# Patient Record
Sex: Female | Born: 1950 | Race: White | Hispanic: No | Marital: Married | Smoking: Former smoker
Health system: Southern US, Community
[De-identification: ages and names within clinical notes are randomized; demographics above are authoritative.]

## PROBLEM LIST (undated history)

## (undated) DIAGNOSIS — D869 Sarcoidosis, unspecified: Secondary | ICD-10-CM

## (undated) DIAGNOSIS — I639 Cerebral infarction, unspecified: Secondary | ICD-10-CM

## (undated) DIAGNOSIS — I10 Essential (primary) hypertension: Secondary | ICD-10-CM

## (undated) HISTORY — DX: Cerebral infarction, unspecified: I63.9

## (undated) HISTORY — DX: Sarcoidosis, unspecified: D86.9

## (undated) HISTORY — DX: Essential (primary) hypertension: I10

## (undated) HISTORY — PX: BRAIN SURGERY: SHX531

---

## 2007-03-02 ENCOUNTER — Emergency Department (HOSPITAL_COMMUNITY): Admission: EM | Admit: 2007-03-02 | Discharge: 2007-03-02 | Payer: Self-pay | Admitting: Emergency Medicine

## 2007-03-07 ENCOUNTER — Emergency Department (HOSPITAL_COMMUNITY): Admission: EM | Admit: 2007-03-07 | Discharge: 2007-03-08 | Payer: Self-pay | Admitting: Emergency Medicine

## 2017-12-31 ENCOUNTER — Other Ambulatory Visit: Payer: Self-pay

## 2017-12-31 ENCOUNTER — Inpatient Hospital Stay (HOSPITAL_COMMUNITY)
Admission: EM | Admit: 2017-12-31 | Discharge: 2018-01-06 | DRG: 025 | Disposition: A | Discharge status: Rehabilitation Facility | Payer: Medicare Other | Attending: Internal Medicine | Admitting: Internal Medicine

## 2017-12-31 ENCOUNTER — Encounter (HOSPITAL_COMMUNITY): Payer: Self-pay

## 2017-12-31 ENCOUNTER — Inpatient Hospital Stay (HOSPITAL_COMMUNITY): Payer: Medicare Other

## 2017-12-31 ENCOUNTER — Emergency Department (HOSPITAL_COMMUNITY): Payer: Medicare Other

## 2017-12-31 DIAGNOSIS — Z794 Long term (current) use of insulin: Secondary | ICD-10-CM | POA: Diagnosis not present

## 2017-12-31 DIAGNOSIS — G968 Other specified disorders of central nervous system: Secondary | ICD-10-CM

## 2017-12-31 DIAGNOSIS — Z8249 Family history of ischemic heart disease and other diseases of the circulatory system: Secondary | ICD-10-CM | POA: Diagnosis not present

## 2017-12-31 DIAGNOSIS — R4781 Slurred speech: Secondary | ICD-10-CM | POA: Diagnosis not present

## 2017-12-31 DIAGNOSIS — G9341 Metabolic encephalopathy: Secondary | ICD-10-CM | POA: Diagnosis present

## 2017-12-31 DIAGNOSIS — G939 Disorder of brain, unspecified: Secondary | ICD-10-CM | POA: Diagnosis not present

## 2017-12-31 DIAGNOSIS — R739 Hyperglycemia, unspecified: Secondary | ICD-10-CM | POA: Diagnosis not present

## 2017-12-31 DIAGNOSIS — D496 Neoplasm of unspecified behavior of brain: Secondary | ICD-10-CM | POA: Diagnosis not present

## 2017-12-31 DIAGNOSIS — R7989 Other specified abnormal findings of blood chemistry: Secondary | ICD-10-CM | POA: Diagnosis not present

## 2017-12-31 DIAGNOSIS — Z79899 Other long term (current) drug therapy: Secondary | ICD-10-CM

## 2017-12-31 DIAGNOSIS — R410 Disorientation, unspecified: Secondary | ICD-10-CM | POA: Insufficient documentation

## 2017-12-31 DIAGNOSIS — R482 Apraxia: Secondary | ICD-10-CM | POA: Diagnosis present

## 2017-12-31 DIAGNOSIS — C8599 Non-Hodgkin lymphoma, unspecified, extranodal and solid organ sites: Secondary | ICD-10-CM | POA: Diagnosis not present

## 2017-12-31 DIAGNOSIS — I69320 Aphasia following cerebral infarction: Secondary | ICD-10-CM | POA: Diagnosis not present

## 2017-12-31 DIAGNOSIS — E669 Obesity, unspecified: Secondary | ICD-10-CM | POA: Diagnosis present

## 2017-12-31 DIAGNOSIS — Z23 Encounter for immunization: Secondary | ICD-10-CM

## 2017-12-31 DIAGNOSIS — R569 Unspecified convulsions: Secondary | ICD-10-CM | POA: Diagnosis not present

## 2017-12-31 DIAGNOSIS — C719 Malignant neoplasm of brain, unspecified: Secondary | ICD-10-CM | POA: Diagnosis present

## 2017-12-31 DIAGNOSIS — Y838 Other surgical procedures as the cause of abnormal reaction of the patient, or of later complication, without mention of misadventure at the time of the procedure: Secondary | ICD-10-CM | POA: Diagnosis present

## 2017-12-31 DIAGNOSIS — E46 Unspecified protein-calorie malnutrition: Secondary | ICD-10-CM | POA: Diagnosis not present

## 2017-12-31 DIAGNOSIS — I169 Hypertensive crisis, unspecified: Secondary | ICD-10-CM | POA: Diagnosis not present

## 2017-12-31 DIAGNOSIS — R930 Abnormal findings on diagnostic imaging of skull and head, not elsewhere classified: Secondary | ICD-10-CM | POA: Diagnosis not present

## 2017-12-31 DIAGNOSIS — G936 Cerebral edema: Secondary | ICD-10-CM | POA: Diagnosis present

## 2017-12-31 DIAGNOSIS — T380X5A Adverse effect of glucocorticoids and synthetic analogues, initial encounter: Secondary | ICD-10-CM | POA: Diagnosis not present

## 2017-12-31 DIAGNOSIS — R269 Unspecified abnormalities of gait and mobility: Secondary | ICD-10-CM | POA: Diagnosis not present

## 2017-12-31 DIAGNOSIS — R29810 Facial weakness: Secondary | ICD-10-CM | POA: Diagnosis present

## 2017-12-31 DIAGNOSIS — Z800 Family history of malignant neoplasm of digestive organs: Secondary | ICD-10-CM | POA: Diagnosis not present

## 2017-12-31 DIAGNOSIS — F17210 Nicotine dependence, cigarettes, uncomplicated: Secondary | ICD-10-CM | POA: Diagnosis not present

## 2017-12-31 DIAGNOSIS — Z6834 Body mass index (BMI) 34.0-34.9, adult: Secondary | ICD-10-CM

## 2017-12-31 DIAGNOSIS — G9389 Other specified disorders of brain: Secondary | ICD-10-CM | POA: Diagnosis not present

## 2017-12-31 DIAGNOSIS — I10 Essential (primary) hypertension: Secondary | ICD-10-CM | POA: Diagnosis present

## 2017-12-31 DIAGNOSIS — R531 Weakness: Secondary | ICD-10-CM | POA: Diagnosis present

## 2017-12-31 DIAGNOSIS — L7632 Postprocedural hematoma of skin and subcutaneous tissue following other procedure: Secondary | ICD-10-CM | POA: Diagnosis not present

## 2017-12-31 DIAGNOSIS — R4701 Aphasia: Secondary | ICD-10-CM | POA: Diagnosis not present

## 2017-12-31 DIAGNOSIS — K5900 Constipation, unspecified: Secondary | ICD-10-CM | POA: Diagnosis present

## 2017-12-31 DIAGNOSIS — I69398 Other sequelae of cerebral infarction: Secondary | ICD-10-CM | POA: Diagnosis not present

## 2017-12-31 DIAGNOSIS — R51 Headache: Secondary | ICD-10-CM

## 2017-12-31 DIAGNOSIS — G9689 Other specified disorders of central nervous system: Secondary | ICD-10-CM | POA: Diagnosis present

## 2017-12-31 DIAGNOSIS — G8191 Hemiplegia, unspecified affecting right dominant side: Secondary | ICD-10-CM | POA: Diagnosis not present

## 2017-12-31 DIAGNOSIS — R4020 Unspecified coma: Secondary | ICD-10-CM | POA: Diagnosis not present

## 2017-12-31 DIAGNOSIS — R799 Abnormal finding of blood chemistry, unspecified: Secondary | ICD-10-CM | POA: Diagnosis not present

## 2017-12-31 DIAGNOSIS — J9811 Atelectasis: Secondary | ICD-10-CM | POA: Diagnosis not present

## 2017-12-31 DIAGNOSIS — G40909 Epilepsy, unspecified, not intractable, without status epilepticus: Secondary | ICD-10-CM | POA: Diagnosis not present

## 2017-12-31 DIAGNOSIS — R519 Headache, unspecified: Secondary | ICD-10-CM | POA: Diagnosis present

## 2017-12-31 DIAGNOSIS — C8589 Other specified types of non-Hodgkin lymphoma, extranodal and solid organ sites: Secondary | ICD-10-CM | POA: Diagnosis not present

## 2017-12-31 DIAGNOSIS — I69351 Hemiplegia and hemiparesis following cerebral infarction affecting right dominant side: Secondary | ICD-10-CM | POA: Diagnosis not present

## 2017-12-31 DIAGNOSIS — R7303 Prediabetes: Secondary | ICD-10-CM | POA: Diagnosis present

## 2017-12-31 DIAGNOSIS — G969 Disorder of central nervous system, unspecified: Secondary | ICD-10-CM | POA: Diagnosis not present

## 2017-12-31 DIAGNOSIS — K8020 Calculus of gallbladder without cholecystitis without obstruction: Secondary | ICD-10-CM | POA: Diagnosis not present

## 2017-12-31 LAB — DIFFERENTIAL
Basophils Absolute: 0 10*3/uL (ref 0.0–0.1)
Basophils Relative: 0 %
Eosinophils Absolute: 0.1 10*3/uL (ref 0.0–0.7)
Eosinophils Relative: 1 %
Lymphocytes Relative: 13 %
Lymphs Abs: 1.4 10*3/uL (ref 0.7–4.0)
Monocytes Absolute: 0.9 10*3/uL (ref 0.1–1.0)
Monocytes Relative: 9 %
Neutro Abs: 7.9 10*3/uL — ABNORMAL HIGH (ref 1.7–7.7)
Neutrophils Relative %: 77 %

## 2017-12-31 LAB — I-STAT CHEM 8, ED
BUN: 24 mg/dL — ABNORMAL HIGH (ref 6–20)
Calcium, Ion: 1.01 mmol/L — ABNORMAL LOW (ref 1.15–1.40)
Chloride: 103 mmol/L (ref 101–111)
Creatinine, Ser: 0.6 mg/dL (ref 0.44–1.00)
Glucose, Bld: 110 mg/dL — ABNORMAL HIGH (ref 65–99)
HCT: 41 % (ref 36.0–46.0)
Hemoglobin: 13.9 g/dL (ref 12.0–15.0)
Potassium: 4.5 mmol/L (ref 3.5–5.1)
Sodium: 135 mmol/L (ref 135–145)
TCO2: 23 mmol/L (ref 22–32)

## 2017-12-31 LAB — CBC
HCT: 45.3 % (ref 36.0–46.0)
Hemoglobin: 15.3 g/dL — ABNORMAL HIGH (ref 12.0–15.0)
MCH: 31.5 pg (ref 26.0–34.0)
MCHC: 33.8 g/dL (ref 30.0–36.0)
MCV: 93.4 fL (ref 78.0–100.0)
Platelets: 424 10*3/uL — ABNORMAL HIGH (ref 150–400)
RBC: 4.85 MIL/uL (ref 3.87–5.11)
RDW: 13.1 % (ref 11.5–15.5)
WBC: 9.4 10*3/uL (ref 4.0–10.5)

## 2017-12-31 LAB — COMPREHENSIVE METABOLIC PANEL
ALT: 14 U/L (ref 14–54)
AST: 15 U/L (ref 15–41)
Albumin: 4.1 g/dL (ref 3.5–5.0)
Alkaline Phosphatase: 118 U/L (ref 38–126)
Anion gap: 11 (ref 5–15)
BUN: 17 mg/dL (ref 6–20)
CO2: 24 mmol/L (ref 22–32)
Calcium: 9.2 mg/dL (ref 8.9–10.3)
Chloride: 101 mmol/L (ref 101–111)
Creatinine, Ser: 0.74 mg/dL (ref 0.44–1.00)
GFR calc Af Amer: >60 mL/min (ref >60)
GFR calc non Af Amer: >60 mL/min (ref >60)
Glucose, Bld: 115 mg/dL — ABNORMAL HIGH (ref 65–99)
Potassium: 3.6 mmol/L (ref 3.5–5.1)
Sodium: 136 mmol/L (ref 135–145)
Total Bilirubin: 0.7 mg/dL (ref 0.3–1.2)
Total Protein: 7.2 g/dL (ref 6.5–8.1)

## 2017-12-31 LAB — I-STAT TROPONIN, ED: Troponin i, poc: 0 ng/mL (ref 0.00–0.08)

## 2017-12-31 LAB — PROTIME-INR
INR: 1.19
Prothrombin Time: 15 seconds (ref 11.4–15.2)

## 2017-12-31 LAB — CBG MONITORING, ED: Glucose-Capillary: 101 mg/dL — ABNORMAL HIGH (ref 65–99)

## 2017-12-31 LAB — ETHANOL: Alcohol, Ethyl (B): <10 mg/dL (ref <10)

## 2017-12-31 LAB — APTT: aPTT: 40 seconds — ABNORMAL HIGH (ref 24–36)

## 2017-12-31 MED ORDER — DEXAMETHASONE SODIUM PHOSPHATE 10 MG/ML IJ SOLN
10.0000 mg | Freq: Once | INTRAMUSCULAR | Status: AC
  Administered 2017-12-31: 10 mg via INTRAVENOUS
  Filled 2017-12-31: qty 1

## 2017-12-31 MED ORDER — GADOBENATE DIMEGLUMINE 529 MG/ML IV SOLN
20.0000 mL | Freq: Once | INTRAVENOUS | Status: AC | PRN
  Administered 2018-01-01: 20 mL via INTRAVENOUS

## 2017-12-31 NOTE — ED Provider Notes (Signed)
[facility] Provider Note   CSN: [id number] Arrival date & time: 12/31/17  1553     History   Chief Complaint Chief Complaint  Patient presents with  . Altered Mental Status    HPI Tara [name] is a 67 y.o. female.  HPI  67 year old female known to giving a past medical history presents the emergency department accompanied by son and husband who are concerned about family member with perceived confusion described as not oriented to time, delayed speech, difficulty with word finding with last known normal yesterday at 37 with an associated headache that is now resolved.  Patient denies any recent trauma/illness or fever.  Patient denies any antiplatelet/anticoagulation therapy.  Currently denies any headache.  History reviewed. No pertinent past medical history.  Patient Active Problem List   Diagnosis Date Noted  . Headache 12/31/2017  . Hypertension 12/31/2017  . Hyperglycemia 12/31/2017  . Confusion 12/31/2017    History reviewed. No pertinent surgical history.  OB History    No data available       Home Medications    Prior to Admission medications   Medication Sig Start Date End Date Taking? Authorizing Provider  aspirin-acetaminophen-caffeine (EXCEDRIN MIGRAINE) [phone] MG tablet Take 1 tablet by mouth every 6 (six) hours as needed for headache or migraine.   Yes [provider]  Multiple Vitamin (MULTIVITAMIN WITH MINERALS) TABS tablet Take 1 tablet by mouth daily.   Yes [provider]  OVER THE COUNTER MEDICATION Take by mouth See admin instructions. Over the counter diet pills (Keto etc)   Yes [provider]    Family History Family History  Problem Relation Age of Onset  . Colon cancer Mother   . CAD Father     Social History Social History   Tobacco Use  . Smoking status: Current Every Day Smoker    Packs/day: 1.00    Types: Cigarettes  . Smokeless tobacco: Never Used   Substance Use Topics  . Alcohol use: No    Frequency: Never  . Drug use: No     Allergies   Patient has no known allergies.   Review of Systems Review of Systems  Review of Systems  Constitutional: Negative for fever and chills.  HENT: Negative for ear pain, sore throat and trouble swallowing.   Eyes: Negative for pain and visual disturbance.  Respiratory: Negative for cough and shortness of breath.   Cardiovascular: Negative for chest pain and leg swelling.  Gastrointestinal: Negative for nausea, vomiting, abdominal pain and diarrhea.  Genitourinary: Negative for dysuria, urgency and frequency.  Musculoskeletal: Negative for back pain and joint swelling.  Skin: Negative for rash and wound.  Neurological: see HPI   Physical Exam Updated Vital Signs BP (!) 157/76   Pulse 73   Temp 98.5 F (36.9 C) (Oral)   Resp 19   Wt 108.9 kg (240 lb)   SpO2 95%   Physical Exam  Physical Exam Vitals:   12/31/17 2303 12/31/17 2315  BP: (!) 157/99 (!) 157/76  Pulse: 73 73  Resp: 17 19  Temp:    SpO2: 96% 95%   Constitutional: Patient is in no acute distress Head: Normocephalic and atraumatic.  Eyes: Extraocular motion intact, no scleral icterus Neck: Supple without meningismus, mass, or overt JVD Respiratory: Effort normal and breath sounds normal. No respiratory distress. CV: Heart regular rate and rhythm, no obvious murmurs.  Pulses +2 and symmetric Abdomen: Soft, non-tender, non-distended MSK: Extremities are atraumatic without deformity,  ROM intact Skin: Warm, dry, intact Neuro: confusion to time/ "president", no motor deficit noted; CN II-XII intact; neg pronator drift; F-->N intact; RAM intact; slow speech/ Psychiatric: Mood and affect are normal.  ED Treatments / Results  Labs (all labs ordered are listed, but only abnormal results are displayed) Labs Reviewed  COMPREHENSIVE METABOLIC PANEL - Abnormal; Notable for the following components:      Result Value    Glucose, Bld 115 (*)    All other components within normal limits  CBC - Abnormal; Notable for the following components:   Hemoglobin 15.3 (*)    Platelets 424 (*)    All other components within normal limits  APTT - Abnormal; Notable for the following components:   aPTT 40 (*)    All other components within normal limits  DIFFERENTIAL - Abnormal; Notable for the following components:   Neutro Abs 7.9 (*)    All other components within normal limits  CBG MONITORING, ED - Abnormal; Notable for the following components:   Glucose-Capillary 101 (*)    All other components within normal limits  I-STAT CHEM 8, ED - Abnormal; Notable for the following components:   BUN 24 (*)    Glucose, Bld 110 (*)    Calcium, Ion 1.01 (*)    All other components within normal limits  ETHANOL  PROTIME-INR  RAPID URINE DRUG SCREEN, HOSP PERFORMED  URINALYSIS, ROUTINE W REFLEX MICROSCOPIC  I-STAT TROPONIN, ED    EKG  EKG Interpretation None       Radiology Ct Head Wo Contrast  Result Date: 12/31/2017 CLINICAL DATA:  Altered level of consciousness. EXAM: CT HEAD WITHOUT CONTRAST TECHNIQUE: Contiguous axial images were obtained from the base of the skull through the vertex without intravenous contrast. COMPARISON:  None. FINDINGS: Brain: Large amount of white matter edema is seen in the left basal ganglia and temporal lobe. This is concerning for underlying neoplasm. This results in 9 mm of left-to-right midline shift. No ventricular dilatation is noted. No definite hemorrhage is noted. Vascular: No hyperdense vessel or unexpected calcification. Skull: Normal. Negative for fracture or focal lesion. Sinuses/Orbits: No acute finding. Other: None. IMPRESSION: Large amount of white matter edema is seen in the left basal ganglia and left temporal lobe concerning for underlying neoplasm. This results in 9 mm of left-to-right midline shift. Further evaluation with MRI with and without gadolinium administration is  recommended. Electronically Signed   By: Marijo Conception, M.D.   On: 12/31/2017 21:32    Procedures Procedures (including critical care time)  Medications Ordered in ED Medications  gadobenate dimeglumine (MULTIHANCE) injection 20 mL (not administered)  dexamethasone (DECADRON) injection 10 mg (10 mg Intravenous Given 12/31/17 2258)     Initial Impression / Assessment and Plan / ED Course  I have reviewed the triage vital signs and the nursing notes.  Pertinent labs & imaging results that were available during my care of the patient were reviewed by me and considered in my medical decision making (see chart for details).     67 year old female known to giving a past medical history presents the emergency department accompanied by son and husband who are concerned about family member with perceived confusion described as not oriented to time, delayed speech, difficulty with word finding with last known normal yesterday at 44 with an associated headache that is now resolved.  Patient denies any recent trauma/illness or fever.  Patient denies any antiplatelet/anticoagulation therapy.  Currently denies any headache.  Patient arrives here medically  stable otherwise well-appearing.  Physical exam with no evidence of acute focal deficit.  Review of labs unremarkable.  CT head Noncon with left basal ganglia/left pleural lobe concerning for underlying neoplasm with a left right 9 mm shift.  plan for admission to hospitalist.  Hospitalist will call neurology in AM. MR brain with and without contrast along with MRA brain ordered.  Hospitalist will admit to floor and further disposition per neurology with possible consults to hematology/neurosurgery pending imaging studies.  Of note 10 mg Decadron given in the emergency department.   Final Clinical Impressions(s) / ED Diagnoses   Final diagnoses:  Confusion  Abnormal CT of the head    ED Discharge Orders    None       Willette Alma,  DO 12/31/17 [place], [place], DO 12/31/17 2352

## 2017-12-31 NOTE — ED Notes (Signed)
Patient transported to MRI 

## 2017-12-31 NOTE — ED Triage Notes (Addendum)
Pt presents to the ed with complaints of confusion that started yesterday. Pt has head a mild headache x 1 day, has subsided at this time.  No focal neuro deficits. Pt is alert to self and place, disoriented to time.

## 2017-12-31 NOTE — H&P (Signed)
[facility] H&P   Patient Demographics:    Tara [name], is a 67 y.o. female  MRN: [id number]   DOB - 1951-09-17  Admit Date - 12/31/2017  Outpatient Primary MD for the patient is Patient, No Pcp Per  Referring MD/NP/PA:  Agustina Caroli   Outpatient Specialists:   Patient coming from: home  Chief Complaint  Patient presents with  . Altered Mental Status      HPI:    Tara [name]  is a 67 y.o. female, w  C/o confusion, and difficulty finishing sentences since last nite.  Pt c/o slight left frontal headache.  Pt denies fever, chills, cough, cp, palp, sob, n/v, diarrhea, dysuria, numbness, tingling, weakness.  Pt brought for evaluation of confusion.   In Ed,  Neurology consulted by ED, and requested MRI/MRA brain to determine if stroke vs brain tumor.    CT brain IMPRESSION: Large amount of white matter edema is seen in the left basal ganglia and left temporal lobe concerning for underlying neoplasm. This results in 9 mm of left-to-right midline shift. Further evaluation with MRI with and without gadolinium administration is recommended.  Na 136, K 3.6, Bun 17, Creatinine 0.74 Ast 15, Alt 14 Wbc 9.4, Hgb 15.3, Plt 424 Etoh <10 INR 1.19 PTT 40  MRI/ MRA IMPRESSION: 1. Masslike parenchymal contrast enhancement adjacent to the left MCA M1 and proximal M2 segments with large area of surrounding hyperintense T2-weighted signal. The appearance is concerning for lymphoma -- including the intravascular variant -- with a large amount of surrounding edema. A primary CNS neoplasm, most likely a high grade glioma, is also a possibility, in which case the abnormal T2WI hyperintensity could indicate edema or non-enhancing tumor. Vasculitis is less likely in the context of normal MRA, but remains a consideration (MRA is less sensitive for detecting findings of vasculitis than CTA  or conventional angiography). 2. Normal MRA. 3. Unchanged 6 mm rightward midline shift.   Pt will be admitted for evaluation of above.       Review of systems:    In addition to the HPI above,  No Fever-chills,  No changes with Vision or hearing, No problems swallowing food or Liquids, No Chest pain, Cough or Shortness of Breath, No Abdominal pain, No Nausea or Vommitting, Bowel movements are regular, No Blood in stool or Urine, No dysuria, No new skin rashes or bruises, No new joints pains-aches,  No new weakness, tingling, numbness in any extremity, No recent weight gain or loss, No polyuria, polydypsia or polyphagia, No significant Mental Stressors.  A full 10 point Review of Systems was done, except as stated above, all other Review of Systems were negative.   With Past History of the following :    History reviewed. No pertinent past medical history.    History reviewed. No pertinent surgical history.    Social History:     Social History  Tobacco Use  . Smoking status: Current Every Day Smoker    Packs/day: 1.00    Types: Cigarettes  . Smokeless tobacco: Never Used  Substance Use Topics  . Alcohol use: No    Frequency: Never     Lives - at home  Mobility - walks by self   Family History :     Family History  Problem Relation Age of Onset  . Colon cancer Mother   . CAD Father    Negative for brain tumor   Home Medications:   Prior to Admission medications   Medication Sig Start Date End Date Taking? Authorizing Provider  aspirin-acetaminophen-caffeine (EXCEDRIN MIGRAINE) [phone] MG tablet Take 1 tablet by mouth every 6 (six) hours as needed for headache or migraine.   Yes [provider]  Multiple Vitamin (MULTIVITAMIN WITH MINERALS) TABS tablet Take 1 tablet by mouth daily.   Yes [provider]  OVER THE COUNTER MEDICATION Take by mouth See admin instructions. Over the counter diet pills (Keto etc)   Yes [provider]     Allergies:    No Known Allergies   Physical Exam:   Vitals  Blood pressure (!) 157/76, pulse 73, temperature 98.5 F (36.9 C), temperature source Oral, resp. rate 19, weight 108.9 kg (240 lb), SpO2 95 %.   1. General  lying in bed in NAD,   2. Normal affect and insight, Not Suicidal or Homicidal, Awake Alert, Oriented X 3.  3. No F.N deficits, ALL C.Nerves Intact, Strength 5/5 all 4 extremities, Sensation intact all 4 extremities, Plantars down going.  4. Ears and Eyes appear Normal, Conjunctivae clear, PERRLA. Moist Oral Mucosa.  5. Supple Neck, No JVD, No cervical lymphadenopathy appriciated, No Carotid Bruits.  6. Symmetrical Chest wall movement, Good air movement bilaterally, CTAB.  7. RRR, No Gallops, Rubs or Murmurs, No Parasternal Heave.  8. Positive Bowel Sounds, Abdomen Soft, No tenderness, No organomegaly appriciated,No rebound -guarding or rigidity.  9.  No Cyanosis, Normal Skin Turgor, No Skin Rash or Bruise.  10. Good muscle tone,  joints appear normal , no effusions, Normal ROM.  11. No Palpable Lymph Nodes in Neck or Axillae     Data Review:    CBC Recent Labs  Lab 12/31/17 1638 12/31/17 2204  WBC 9.4  --   HGB 15.3* 13.9  HCT 45.3 41.0  PLT 424*  --   MCV 93.4  --   MCH 31.5  --   MCHC 33.8  --   RDW 13.1  --   LYMPHSABS 1.4  --   MONOABS 0.9  --   EOSABS 0.1  --   BASOSABS 0.0  --    ------------------------------------------------------------------------------------------------------------------  Chemistries  Recent Labs  Lab 12/31/17 1638 12/31/17 2204  NA 136 135  K 3.6 4.5  CL 101 103  CO2 24  --   GLUCOSE 115* 110*  BUN 17 24*  CREATININE 0.74 0.60  CALCIUM 9.2  --   AST 15  --   ALT 14  --   ALKPHOS 118  --   BILITOT 0.7  --    ------------------------------------------------------------------------------------------------------------------ CrCl cannot be calculated (Unknown ideal  weight.). ------------------------------------------------------------------------------------------------------------------ No results for input(s): TSH, T4TOTAL, T3FREE, THYROIDAB in the last 72 hours.  Invalid input(s): FREET3  Coagulation profile Recent Labs  Lab 12/31/17 2156  INR 1.19   ------------------------------------------------------------------------------------------------------------------- No results for input(s): DDIMER in the last 72 hours. -------------------------------------------------------------------------------------------------------------------  Cardiac Enzymes No results for input(s): CKMB, TROPONINI, MYOGLOBIN in  the last 168 hours.  Invalid input(s): CK ------------------------------------------------------------------------------------------------------------------ No results found for: BNP   ---------------------------------------------------------------------------------------------------------------  Urinalysis No results found for: COLORURINE, APPEARANCEUR, LABSPEC, PHURINE, GLUCOSEU, HGBUR, BILIRUBINUR, KETONESUR, PROTEINUR, UROBILINOGEN, NITRITE, LEUKOCYTESUR  ----------------------------------------------------------------------------------------------------------------   Imaging Results:    Ct Head Wo Contrast  Result Date: 12/31/2017 CLINICAL DATA:  Altered level of consciousness. EXAM: CT HEAD WITHOUT CONTRAST TECHNIQUE: Contiguous axial images were obtained from the base of the skull through the vertex without intravenous contrast. COMPARISON:  None. FINDINGS: Brain: Large amount of white matter edema is seen in the left basal ganglia and temporal lobe. This is concerning for underlying neoplasm. This results in 9 mm of left-to-right midline shift. No ventricular dilatation is noted. No definite hemorrhage is noted. Vascular: No hyperdense vessel or unexpected calcification. Skull: Normal. Negative for fracture or focal lesion.  Sinuses/Orbits: No acute finding. Other: None. IMPRESSION: Large amount of white matter edema is seen in the left basal ganglia and left temporal lobe concerning for underlying neoplasm. This results in 9 mm of left-to-right midline shift. Further evaluation with MRI with and without gadolinium administration is recommended. Electronically Signed   By: Marijo Conception, M.D.   On: 12/31/2017 21:32   Mr Brain W And Wo Contrast  Result Date: 01/01/2018 CLINICAL DATA:  Confusion and mass demonstrated on earlier head CT EXAM: MRI HEAD WITHOUT AND WITH CONTRAST MRA HEAD WITHOUT CONTRAST TECHNIQUE: Multiplanar, multiecho pulse sequences of the brain and surrounding structures were obtained without and with intravenous contrast. Angiographic images of the head were obtained using MRA technique without contrast. CONTRAST:  28m MULTIHANCE GADOBENATE DIMEGLUMINE 529 MG/ML IV SOLN COMPARISON:  Head CT 12/31/2017 FINDINGS: MRI HEAD FINDINGS Brain: The midline structures are normal. No focal diffusion restriction to indicate acute infarct. No intraparenchymal hemorrhage. Large amount of hyperintense T2-weighted signal in the left basal ganglia, left insula and left temporal lobe. There is an area of contrast enhancement within the brain parenchyma following the left middle cerebral artery M1 and proximal M2 segments. The largest confluent area of enhancement measures 2.2 x 1.3 cm. Rightward midline shift of 6 mm is unchanged. No chronic microhemorrhage or cerebral amyloid angiopathy. No hydrocephalus, age advanced atrophy or lobar predominant volume loss. No dural abnormality or extra-axial collection. Skull and upper cervical spine: The visualized skull base, calvarium, upper cervical spine and extracranial soft tissues are normal. Sinuses/Orbits: No fluid levels or advanced mucosal thickening. No mastoid effusion. Normal orbits. MRA HEAD FINDINGS Intracranial internal carotid arteries: Normal. Anterior cerebral arteries:  Normal. Middle cerebral arteries: Normal. Posterior communicating arteries: Present on the right. Posterior cerebral arteries: Normal. Fetal origin on the right. Basilar artery: Normal. Vertebral arteries: Left dominant. Normal. Superior cerebellar arteries: Normal. Anterior inferior cerebellar arteries: Normal. Posterior inferior cerebellar arteries: Normal. IMPRESSION: 1. Masslike parenchymal contrast enhancement adjacent to the left MCA M1 and proximal M2 segments with large area of surrounding hyperintense T2-weighted signal. The appearance is concerning for lymphoma -- including the intravascular variant -- with a large amount of surrounding edema. A primary CNS neoplasm, most likely a high grade glioma, is also a possibility, in which case the abnormal T2WI hyperintensity could indicate edema or non-enhancing tumor. Vasculitis is less likely in the context of normal MRA, but remains a consideration (MRA is less sensitive for detecting findings of vasculitis than CTA or conventional angiography). 2. Normal MRA. 3. Unchanged 6 mm rightward midline shift. Electronically Signed   By: KUlyses JarredM.D.   On: 01/01/2018 00:39   Mr  Mra Head (cerebral Arteries)  Result Date: 01/01/2018 CLINICAL DATA:  Confusion and mass demonstrated on earlier head CT EXAM: MRI HEAD WITHOUT AND WITH CONTRAST MRA HEAD WITHOUT CONTRAST TECHNIQUE: Multiplanar, multiecho pulse sequences of the brain and surrounding structures were obtained without and with intravenous contrast. Angiographic images of the head were obtained using MRA technique without contrast. CONTRAST:  22m MULTIHANCE GADOBENATE DIMEGLUMINE 529 MG/ML IV SOLN COMPARISON:  Head CT 12/31/2017 FINDINGS: MRI HEAD FINDINGS Brain: The midline structures are normal. No focal diffusion restriction to indicate acute infarct. No intraparenchymal hemorrhage. Large amount of hyperintense T2-weighted signal in the left basal ganglia, left insula and left temporal lobe. There is  an area of contrast enhancement within the brain parenchyma following the left middle cerebral artery M1 and proximal M2 segments. The largest confluent area of enhancement measures 2.2 x 1.3 cm. Rightward midline shift of 6 mm is unchanged. No chronic microhemorrhage or cerebral amyloid angiopathy. No hydrocephalus, age advanced atrophy or lobar predominant volume loss. No dural abnormality or extra-axial collection. Skull and upper cervical spine: The visualized skull base, calvarium, upper cervical spine and extracranial soft tissues are normal. Sinuses/Orbits: No fluid levels or advanced mucosal thickening. No mastoid effusion. Normal orbits. MRA HEAD FINDINGS Intracranial internal carotid arteries: Normal. Anterior cerebral arteries: Normal. Middle cerebral arteries: Normal. Posterior communicating arteries: Present on the right. Posterior cerebral arteries: Normal. Fetal origin on the right. Basilar artery: Normal. Vertebral arteries: Left dominant. Normal. Superior cerebellar arteries: Normal. Anterior inferior cerebellar arteries: Normal. Posterior inferior cerebellar arteries: Normal. IMPRESSION: 1. Masslike parenchymal contrast enhancement adjacent to the left MCA M1 and proximal M2 segments with large area of surrounding hyperintense T2-weighted signal. The appearance is concerning for lymphoma -- including the intravascular variant -- with a large amount of surrounding edema. A primary CNS neoplasm, most likely a high grade glioma, is also a possibility, in which case the abnormal T2WI hyperintensity could indicate edema or non-enhancing tumor. Vasculitis is less likely in the context of normal MRA, but remains a consideration (MRA is less sensitive for detecting findings of vasculitis than CTA or conventional angiography). 2. Normal MRA. 3. Unchanged 6 mm rightward midline shift. Electronically Signed   By: KUlyses JarredM.D.   On: 01/01/2018 00:39       Assessment & Plan:    Principal Problem:    Confusion Active Problems:   Headache   Hypertension   Hyperglycemia    Confusion Check b12, esr, ana, rpr, tsh MRI/MRI brain => ? CNS lymphoma, glioma Start on decadron '4mg'$  iv q6h for midline shift Check CT chest / abd/ pelvis Neurosurgery consulted , will be by in AM Please call oncology in AM  Hypertension Hydralazine '5mg'$  iv q6h prn sbp >160 Start amlodipine 2.'5mg'$  po qday  Hyperglycemia Check hga1c    DVT Prophylaxis   Lovenox - SCDs  AM Labs Ordered, also please review Full Orders  Family Communication: Admission, patients condition and plan of care including tests being ordered have been discussed with the patient  who indicate understanding and agree with the plan and Code Status.  Code Status FULL CODE  Likely DC to  home  Condition GUARDED    Consults called: neurosurgery Dr. SVertell Limber called, will be by in AM  Admission status: inpatient  Time spent in minutes : 45   JJani GravelM.D on 01/01/2018 at 1:09 AM  Between 7am to 7pm - Pager - [phone] After 7pm go to [email or web address] - password [email or web address]  [facility] - Office  [phone]

## 2017-12-31 NOTE — ED Notes (Signed)
Nurse drawing labs. 

## 2017-12-31 NOTE — ED Notes (Signed)
Pt alert to person, place, and  Situation. Pt able to answer questions correctly but family states she forgets names and takes longer than usual to answer questions. No hx of dementia. No neuro deficits witnessed. Pt talk in full complete sentences with clear speech. Pt denies weakness and fatigue. Symmetrical smile present. Ambulatory with steady gait. Abdomen soft and non distended. Pt stable.

## 2017-12-31 NOTE — ED Notes (Signed)
ED Provider at bedside. 

## 2018-01-01 ENCOUNTER — Inpatient Hospital Stay (HOSPITAL_COMMUNITY): Payer: Medicare Other

## 2018-01-01 DIAGNOSIS — G968 Other specified disorders of central nervous system: Secondary | ICD-10-CM

## 2018-01-01 DIAGNOSIS — G969 Disorder of central nervous system, unspecified: Secondary | ICD-10-CM

## 2018-01-01 DIAGNOSIS — G9689 Other specified disorders of central nervous system: Secondary | ICD-10-CM | POA: Diagnosis present

## 2018-01-01 DIAGNOSIS — G9341 Metabolic encephalopathy: Secondary | ICD-10-CM | POA: Diagnosis present

## 2018-01-01 LAB — URINALYSIS, ROUTINE W REFLEX MICROSCOPIC
Bilirubin Urine: NEGATIVE
Glucose, UA: NEGATIVE mg/dL
Ketones, ur: NEGATIVE mg/dL
Nitrite: NEGATIVE
Protein, ur: NEGATIVE mg/dL
Specific Gravity, Urine: 1.024 (ref 1.005–1.030)
pH: 5 (ref 5.0–8.0)

## 2018-01-01 LAB — COMPREHENSIVE METABOLIC PANEL
ALT: 17 U/L (ref 14–54)
AST: 15 U/L (ref 15–41)
Albumin: 3.7 g/dL (ref 3.5–5.0)
Alkaline Phosphatase: 113 U/L (ref 38–126)
Anion gap: 11 (ref 5–15)
BUN: 15 mg/dL (ref 6–20)
CO2: 21 mmol/L — ABNORMAL LOW (ref 22–32)
Calcium: 9.3 mg/dL (ref 8.9–10.3)
Chloride: 101 mmol/L (ref 101–111)
Creatinine, Ser: 0.62 mg/dL (ref 0.44–1.00)
GFR calc Af Amer: >60 mL/min (ref >60)
GFR calc non Af Amer: >60 mL/min (ref >60)
Glucose, Bld: 147 mg/dL — ABNORMAL HIGH (ref 65–99)
Potassium: 3.8 mmol/L (ref 3.5–5.1)
Sodium: 133 mmol/L — ABNORMAL LOW (ref 135–145)
Total Bilirubin: 0.6 mg/dL (ref 0.3–1.2)
Total Protein: 6.5 g/dL (ref 6.5–8.1)

## 2018-01-01 LAB — CBC
HCT: 41 % (ref 36.0–46.0)
Hemoglobin: 14 g/dL (ref 12.0–15.0)
MCH: 31.5 pg (ref 26.0–34.0)
MCHC: 34.1 g/dL (ref 30.0–36.0)
MCV: 92.3 fL (ref 78.0–100.0)
Platelets: 378 10*3/uL (ref 150–400)
RBC: 4.44 MIL/uL (ref 3.87–5.11)
RDW: 12.8 % (ref 11.5–15.5)
WBC: 11.3 10*3/uL — ABNORMAL HIGH (ref 4.0–10.5)

## 2018-01-01 LAB — SEDIMENTATION RATE: Sed Rate: 29 mm/hr — ABNORMAL HIGH (ref 0–22)

## 2018-01-01 LAB — HEMOGLOBIN A1C
Hgb A1c MFr Bld: 5.9 % — ABNORMAL HIGH (ref 4.8–5.6)
Mean Plasma Glucose: 122.63 mg/dL

## 2018-01-01 LAB — RPR: RPR Ser Ql: NONREACTIVE

## 2018-01-01 LAB — LACTATE DEHYDROGENASE: LDH: 150 U/L (ref 98–192)

## 2018-01-01 LAB — RAPID URINE DRUG SCREEN, HOSP PERFORMED
Amphetamines: NOT DETECTED
Barbiturates: NOT DETECTED
Benzodiazepines: NOT DETECTED
Cocaine: NOT DETECTED
Opiates: NOT DETECTED
Tetrahydrocannabinol: NOT DETECTED

## 2018-01-01 LAB — HIV ANTIBODY (ROUTINE TESTING W REFLEX): HIV Screen 4th Generation wRfx: NONREACTIVE

## 2018-01-01 LAB — VITAMIN B12: Vitamin B-12: 274 pg/mL (ref 180–914)

## 2018-01-01 LAB — GLUCOSE, CAPILLARY
Glucose-Capillary: 127 mg/dL — ABNORMAL HIGH (ref 65–99)
Glucose-Capillary: 127 mg/dL — ABNORMAL HIGH (ref 65–99)

## 2018-01-01 MED ORDER — POTASSIUM CHLORIDE IN NACL 20-0.9 MEQ/L-% IV SOLN
INTRAVENOUS | Status: DC
  Administered 2018-01-01 – 2018-01-02 (×2): 60 mL/h via INTRAVENOUS
  Administered 2018-01-03 – 2018-01-04 (×2): via INTRAVENOUS
  Filled 2018-01-01 (×6): qty 1000

## 2018-01-01 MED ORDER — IOPAMIDOL (ISOVUE-300) INJECTION 61%
INTRAVENOUS | Status: AC
  Administered 2018-01-01: 100 mL
  Filled 2018-01-01: qty 100

## 2018-01-01 MED ORDER — ONDANSETRON HCL 4 MG/2ML IJ SOLN: 4.0000 mg | Freq: Four times a day (QID) | INTRAMUSCULAR | Status: DC | PRN

## 2018-01-01 MED ORDER — DEXAMETHASONE SODIUM PHOSPHATE 10 MG/ML IJ SOLN
4.0000 mg | Freq: Four times a day (QID) | INTRAMUSCULAR | Status: DC
  Administered 2018-01-01 (×2): 4 mg via INTRAVENOUS
  Filled 2018-01-01 (×2): qty 1

## 2018-01-01 MED ORDER — AMLODIPINE BESYLATE 5 MG PO TABS
5.0000 mg | ORAL_TABLET | Freq: Every day | ORAL | Status: DC
  Administered 2018-01-01 – 2018-01-03 (×3): 5 mg via ORAL
  Filled 2018-01-01 (×3): qty 1

## 2018-01-01 MED ORDER — ENOXAPARIN SODIUM 40 MG/0.4ML ~~LOC~~ SOLN
40.0000 mg | SUBCUTANEOUS | Status: DC
  Administered 2018-01-01 – 2018-01-03 (×3): 40 mg via SUBCUTANEOUS
  Filled 2018-01-01 (×4): qty 0.4

## 2018-01-01 MED ORDER — DEXAMETHASONE SODIUM PHOSPHATE 10 MG/ML IJ SOLN
10.0000 mg | Freq: Four times a day (QID) | INTRAMUSCULAR | Status: DC
  Administered 2018-01-01 – 2018-01-02 (×3): 10 mg via INTRAVENOUS
  Filled 2018-01-01 (×4): qty 1

## 2018-01-01 MED ORDER — SODIUM CHLORIDE 0.9 % IV SOLN
750.0000 mg | Freq: Two times a day (BID) | INTRAVENOUS | Status: DC
  Administered 2018-01-01 – 2018-01-04 (×7): 750 mg via INTRAVENOUS
  Filled 2018-01-01 (×8): qty 7.5

## 2018-01-01 MED ORDER — SODIUM CHLORIDE 0.9 % IV SOLN
INTRAVENOUS | Status: AC
  Administered 2018-01-01: 03:00:00 via INTRAVENOUS

## 2018-01-01 MED ORDER — INSULIN ASPART 100 UNIT/ML ~~LOC~~ SOLN
0.0000 [IU] | Freq: Three times a day (TID) | SUBCUTANEOUS | Status: DC
  Administered 2018-01-01: 1 [IU] via SUBCUTANEOUS
  Administered 2018-01-02: 2 [IU] via SUBCUTANEOUS
  Administered 2018-01-02: 1 [IU] via SUBCUTANEOUS
  Administered 2018-01-02: 2 [IU] via SUBCUTANEOUS
  Administered 2018-01-03 – 2018-01-04 (×4): 1 [IU] via SUBCUTANEOUS

## 2018-01-01 MED ORDER — HYDRALAZINE HCL 20 MG/ML IJ SOLN
10.0000 mg | Freq: Four times a day (QID) | INTRAMUSCULAR | Status: DC | PRN
  Administered 2018-01-01 – 2018-01-04 (×2): 10 mg via INTRAVENOUS
  Filled 2018-01-01 (×2): qty 1

## 2018-01-01 NOTE — Consult Note (Signed)
Neurology Consultation Reason for Consult: Worsening of right side weakness Referring Physician: Ripu Rai    History is obtained from: patient and chart review  HPI: Tara [name] is a 67 y.o. female with PMH of Colon cancer, CAD who presented to ER with headaches, difficulty with speech and confusion. MRI brain shows   area of contrast enhancement within the brain parenchyma followingthe left middle cerebral artery M1 and proximal M2 segments. The largest confluent area of enhancement measures 2.2 x 1.3 cm following by signficant cerebral edema in the basal ganglia and temporal lobe and midline shift  Around 11 am family noticed speech become more slurred and she was unable to move right side. Stroke alert was called, patient had stat head CT. Symptoms had mostly improved by the time of arrival to CT head.     ROS: A 14 point ROS was performed and is negative except as noted in the HPI.     History reviewed. No pertinent past medical history.  Family History  Problem Relation Age of Onset  . Colon cancer Mother   . CAD Father      Social History:  reports that she has been smoking cigarettes.  She has been smoking about 1.00 pack per day. she has never used smokeless tobacco. She reports that she does not drink alcohol or use drugs.   Exam: Current vital signs: BP (!) 150/70 (BP Location: Right Arm)   Pulse 70   Temp 98.6 F (37 C) (Oral)   Resp 18   Ht 5\' 6"  (1.676 m)   Wt 96.9 kg (213 lb 10 oz)   SpO2 96%   BMI 34.48 kg/m  Vital signs in last 24 hours: Temp:  [97.7 F (36.5 C)-98.6 F (37 C)] 98.6 F (37 C) (03/02 1225) Pulse Rate:  [70-85] 70 (03/02 1225) Resp:  [11-19] 18 (03/02 1225) BP: (142-176)/(56-99) 150/70 (03/02 1225) SpO2:  [94 %-100 %] 96 % (03/02 1225) Weight:  [96.9 kg (213 lb 10 oz)-108.9 kg (240 lb)] 96.9 kg (213 lb 10 oz) (03/02 0359)   Physical Exam  Constitutional: Appears well-developed and well-nourished.  Psych: Affect  appropriate to situation Eyes: No scleral injection HENT: No OP obstrucion Head: Normocephalic.  Cardiovascular: Normal rate and regular rhythm.  Respiratory: Effort normal, non-labored breathing GI: Soft.  No distension. There is no tenderness.  Skin: WDI  Neuro: Mental Status: Patient is awake, alert, oriented to person, place,  Patient is not able to give clear history. Mild aphasia Cranial Nerves: II: Visual Fields are full. Pupils are equal, round, and reactive to light.   III,IV, VI: EOMI without ptosis or diploplia.  V: Facial sensation is symmetric to temperature XLK:GMWNU facial droop VIII: hearing is intact to voice X: Uvula elevates symmetrically XI: Shoulder shrug is symmetric. XII: tongue is midline without atrophy or fasciculations.  Motor: Tone is normal. Bulk is normal. 3+/5 strength in RUE, 4/5 strength in RLE. 5/5 strength in both left UE and LE. Sensory: Sensation reduced on right side to light touch, however no neglect.  Deep Tendon Reflexes: 2+ and symmetric in the biceps and patellae.  Plantars: Toes are downgoing bilaterally.  Cerebellar: FNF and HKS are intact bilaterally      I have reviewed labs in epic and the results pertinent to this consultation are:  I have reviewed the images obtained: Repat Ct head stable    ASSESSMENT AND  PLAN   67 y.o. 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 Fluctuating of neurological deficits either from edema vs seizures. Will start patient on Keppra 750 mg BID and EEG tomorrow. Stat CT head showed stable mass effect and midline shift. Neurosurgery consulted.    Plan Keppra 750 mg BID Increased Decadron dose Stat neurosurgery consult.    Karena Addison Wade Sigala MD [facility] [phone]  If 7pm to 7am, please call on call as listed on AMION.

## 2018-01-01 NOTE — Consult Note (Signed)
PULMONARY / CRITICAL CARE MEDICINE   Name: Tara [name] MRN: [id number] DOB: Sep 22, 1951    ADMISSION DATE:  12/31/2017 CONSULTATION DATE:  01/01/2018 REFERRING MD:  Tana Coast, Ripudeep  CHIEF COMPLAINT: Left brain mass and seizures  HISTORY OF PRESENT ILLNESS:        This is a 67 year old who reports no significant past medical history until the past 2 days when she was having difficulties with speech and intermittent difficulties using her right side.  She presented to the emergency room on 3/1 and a CT scan of the head was obtained which showed an ill-defined mass with lots of surrounding edema centered in the left MCA territory.  There was a 4 mm left to right shift.  She denies any known prior history of cancer adenopathy or weight loss.  CTs of the chest abdomen and pelvis have not suggested a primary and she is HIV negative.  This afternoon she had overt seizure activity and was loaded with Keppra and transferred to the intensive care unit.  PAST MEDICAL HISTORY :  She  has no past medical history on file.  PAST SURGICAL HISTORY: She  has no past surgical history on file.  No Known Allergies  No current facility-administered medications on file prior to encounter.    Current Outpatient Medications on File Prior to Encounter  Medication Sig  . aspirin-acetaminophen-caffeine (EXCEDRIN MIGRAINE) 250-250-65 MG tablet Take 1 tablet by mouth every 6 (six) hours as needed for headache or migraine.  . Multiple Vitamin (MULTIVITAMIN WITH MINERALS) TABS tablet Take 1 tablet by mouth daily.  Marland Kitchen OVER THE COUNTER MEDICATION Take by mouth See admin instructions. Over the counter diet pills (Keto etc)    FAMILY HISTORY:  Her indicated that her mother is deceased. She indicated that her father is deceased.   SOCIAL HISTORY: She  reports that she has been smoking cigarettes.  She has been smoking about 1.00 pack per day. she has never used smokeless tobacco. She reports that she does not drink  alcohol or use drugs.  REVIEW OF SYSTEMS:   Activity is somewhat limited by a pain in her right hip.  She has no prior known history of seizure or CVA.  She has no known history of respiratory illness no unusual dyspnea, no known heart disease no known chest pain MIs arrhythmias or syncopal episodes in the past.  She denies a history of GI disease no internal bleeding.  She denies diabetes or thyroid disease.  SUBJECTIVE:  As above  VITAL SIGNS: BP (!) 150/70 (BP Location: Right Arm)   Pulse 70   Temp 98.6 F (37 C) (Oral)   Resp 18   Ht 5\' 6"  (1.676 m)   Wt 213 lb 10 oz (96.9 kg)   SpO2 96%   BMI 34.48 kg/m   HEMODYNAMICS:      INTAKE / OUTPUT: I/O last 3 completed shifts: In: 191.3 [I.V.:191.3] Out: -   PHYSICAL EXAMINATION: General: Middle-aged somewhat obese female in no overt distress Neuro: She is entirely alert and oriented x3.  Pupils are equal and EOMs appear to be full.  She has an overt right facial droop.  Grip is 5/5 bilaterally and she is able to lift both legs off the bed Cardiovascular: S1 and S2 are regular with frequent ectopic.  There is no murmur rub or gallop. Lungs: Abrasions are unlabored, there is symmetric air movement no wheezes Abdomen: The abdomen is obese and soft without any organomegaly masses tenderness guarding or rebound, she  is anicteric Musculoskeletal: No dependent edema, the limbs are warm   LABS:  BMET Recent Labs  Lab 12/31/17 1638 12/31/17 2204 01/01/18 0209  NA 136 135 133*  K 3.6 4.5 3.8  CL 101 103 101  CO2 24  --  21*  BUN 17 24* 15  CREATININE 0.74 0.60 0.62  GLUCOSE 115* 110* 147*    Electrolytes Recent Labs  Lab 12/31/17 1638 01/01/18 0209  CALCIUM 9.2 9.3    CBC Recent Labs  Lab 12/31/17 1638 12/31/17 2204 01/01/18 0209  WBC 9.4  --  11.3*  HGB 15.3* 13.9 14.0  HCT 45.3 41.0 41.0  PLT 424*  --  378    Coag's Recent Labs  Lab 12/31/17 2156  APTT 40*  INR 1.19    Sepsis Markers No  results for input(s): LATICACIDVEN, PROCALCITON, O2SATVEN in the last 168 hours.  ABG No results for input(s): PHART, PCO2ART, PO2ART in the last 168 hours.  Liver Enzymes Recent Labs  Lab 12/31/17 1638 01/01/18 0209  AST 15 15  ALT 14 17  ALKPHOS 118 113  BILITOT 0.7 0.6  ALBUMIN 4.1 3.7    Cardiac Enzymes No results for input(s): TROPONINI, PROBNP in the last 168 hours.  Glucose Recent Labs  Lab 12/31/17 1643  GLUCAP 101*    Imaging Ct Head Wo Contrast  Result Date: 01/01/2018 CLINICAL DATA:  Slurred speech and right-sided weakness. EXAM: CT HEAD WITHOUT CONTRAST TECHNIQUE: Contiguous axial images were obtained from the base of the skull through the vertex without intravenous contrast. COMPARISON:  Head CT and brain MRI dated 12/31/2017 FINDINGS: Brain: The masslike area of heterogeneous attenuation, primarily hypoattenuation, surrounding the central left middle cerebral artery and extending from the left temporal lobe through the base a ganglia to the left centrum semiovale, is without change from the previous day's exams. There is still mild midline shift to the right currently measuring 4 mm. There are no new areas of abnormal parenchymal attenuation and no new areas of mass effect. No intracranial hemorrhage. Vascular: No hyperdense vessel or unexpected calcification. Skull: Normal. Negative for fracture or focal lesion. Sinuses/Orbits: Globes and orbits are unremarkable. Sinuses and mastoid air cells are clear. Other: None. IMPRESSION: 1. No significant change from the previous day's exams. 2. Masslike area of abnormal attenuation, primarily hypoattenuation, with associated mass effect, on the left, centered on the left basal ganglia and surrounding the left middle cerebral artery, is unchanged in extent and degree of mass effect. 3. No new abnormalities.  No intracranial hemorrhage. Electronically Signed   By: Lajean Manes M.D.   On: 01/01/2018 11:35   Ct Head Wo  Contrast  Result Date: 12/31/2017 CLINICAL DATA:  Altered level of consciousness. EXAM: CT HEAD WITHOUT CONTRAST TECHNIQUE: Contiguous axial images were obtained from the base of the skull through the vertex without intravenous contrast. COMPARISON:  None. FINDINGS: Brain: Large amount of white matter edema is seen in the left basal ganglia and temporal lobe. This is concerning for underlying neoplasm. This results in 9 mm of left-to-right midline shift. No ventricular dilatation is noted. No definite hemorrhage is noted. Vascular: No hyperdense vessel or unexpected calcification. Skull: Normal. Negative for fracture or focal lesion. Sinuses/Orbits: No acute finding. Other: None. IMPRESSION: Large amount of white matter edema is seen in the left basal ganglia and left temporal lobe concerning for underlying neoplasm. This results in 9 mm of left-to-right midline shift. Further evaluation with MRI with and without gadolinium administration is recommended. Electronically Signed  By: Marijo Conception, M.D.   On: 12/31/2017 21:32   Ct Chest W Contrast  Result Date: 01/01/2018 CLINICAL DATA:  Findings worrisome for CNS lymphoma versus glioma brain MRI. For staging. EXAM: CT CHEST, ABDOMEN, AND PELVIS WITH CONTRAST TECHNIQUE: Multidetector CT imaging of the chest, abdomen and pelvis was performed following the standard protocol during bolus administration of intravenous contrast. CONTRAST:  163mL ISOVUE-300 IOPAMIDOL (ISOVUE-300) INJECTION 61% COMPARISON:  None. FINDINGS: CT CHEST FINDINGS Cardiovascular: Heart is normal in size.  No pericardial effusion. No evidence of thoracic aortic aneurysm. Mediastinum/Nodes: No suspicious mediastinal, hilar, or axillary lymphadenopathy. Visualized thyroid is unremarkable. Lungs/Pleura: Mild dependent atelectasis in the bilateral lower lobes and posterior left upper lobe. No suspicious pulmonary nodules. No focal consolidation. No pleural effusion or pneumothorax.  Musculoskeletal: Visualized osseous structures are within normal limits. CT ABDOMEN PELVIS FINDINGS Hepatobiliary: 13 mm cyst in segment 5 of the liver (series 3/image 56). Layering gallstone (series 3/image 34). No associated inflammatory changes. No intrahepatic or extrahepatic ductal dilatation. Pancreas: Within normal limits. Spleen: Spleen is normal in size. Adrenals/Urinary Tract: Adrenal glands are within normal limits. 3 mm nonobstructing left lower pole renal calculus (series 3/image 70). Multiple left renal cysts, measuring up to 5.1 cm in the posterior left lower kidney (series 3/image 69), measuring simple fluid density (Bosniak I). Right kidney is within normal limits. No hydronephrosis. Bladder is within normal limits. Stomach/Bowel: Stomach is notable for a tiny hiatal hernia. No evidence of bowel obstruction. Normal appendix (series 3/image 92). Mild sigmoid diverticulosis, without evidence of diverticulitis. Vascular/Lymphatic: No evidence of abdominal aortic aneurysm. Atherosclerotic calcifications of the abdominal aorta and branch vessels. No suspicious abdominopelvic lymphadenopathy. Reproductive: Uterus is within normal limits. Bilateral ovaries are within normal limits. Other: No abdominopelvic ascites. Musculoskeletal: Mild degenerative changes of the lumbar spine. IMPRESSION: No findings suspicious for malignancy (primary or metastatic) in the chest, abdomen, or pelvis. No suspicious lymphadenopathy.  Spleen is normal in size. 3 mm nonobstructing left lower pole renal calculus. Multiple left renal cysts, benign (Bosniak I). Cholelithiasis, without associated inflammatory changes. Electronically Signed   By: Julian Hy M.D.   On: 01/01/2018 09:12   Mr Brain W And Wo Contrast  Result Date: 01/01/2018 CLINICAL DATA:  Confusion and mass demonstrated on earlier head CT EXAM: MRI HEAD WITHOUT AND WITH CONTRAST MRA HEAD WITHOUT CONTRAST TECHNIQUE: Multiplanar, multiecho pulse sequences of  the brain and surrounding structures were obtained without and with intravenous contrast. Angiographic images of the head were obtained using MRA technique without contrast. CONTRAST:  40mL MULTIHANCE GADOBENATE DIMEGLUMINE 529 MG/ML IV SOLN COMPARISON:  Head CT 12/31/2017 FINDINGS: MRI HEAD FINDINGS Brain: The midline structures are normal. No focal diffusion restriction to indicate acute infarct. No intraparenchymal hemorrhage. Large amount of hyperintense T2-weighted signal in the left basal ganglia, left insula and left temporal lobe. There is an area of contrast enhancement within the brain parenchyma following the left middle cerebral artery M1 and proximal M2 segments. The largest confluent area of enhancement measures 2.2 x 1.3 cm. Rightward midline shift of 6 mm is unchanged. No chronic microhemorrhage or cerebral amyloid angiopathy. No hydrocephalus, age advanced atrophy or lobar predominant volume loss. No dural abnormality or extra-axial collection. Skull and upper cervical spine: The visualized skull base, calvarium, upper cervical spine and extracranial soft tissues are normal. Sinuses/Orbits: No fluid levels or advanced mucosal thickening. No mastoid effusion. Normal orbits. MRA HEAD FINDINGS Intracranial internal carotid arteries: Normal. Anterior cerebral arteries: Normal. Middle cerebral arteries: Normal. Posterior  communicating arteries: Present on the right. Posterior cerebral arteries: Normal. Fetal origin on the right. Basilar artery: Normal. Vertebral arteries: Left dominant. Normal. Superior cerebellar arteries: Normal. Anterior inferior cerebellar arteries: Normal. Posterior inferior cerebellar arteries: Normal. IMPRESSION: 1. Masslike parenchymal contrast enhancement adjacent to the left MCA M1 and proximal M2 segments with large area of surrounding hyperintense T2-weighted signal. The appearance is concerning for lymphoma -- including the intravascular variant -- with a large amount of  surrounding edema. A primary CNS neoplasm, most likely a high grade glioma, is also a possibility, in which case the abnormal T2WI hyperintensity could indicate edema or non-enhancing tumor. Vasculitis is less likely in the context of normal MRA, but remains a consideration (MRA is less sensitive for detecting findings of vasculitis than CTA or conventional angiography). 2. Normal MRA. 3. Unchanged 6 mm rightward midline shift. Electronically Signed   By: Ulyses Jarred M.D.   On: 01/01/2018 00:39   Ct Abdomen Pelvis W Contrast  Result Date: 01/01/2018 CLINICAL DATA:  Findings worrisome for CNS lymphoma versus glioma brain MRI. For staging. EXAM: CT CHEST, ABDOMEN, AND PELVIS WITH CONTRAST TECHNIQUE: Multidetector CT imaging of the chest, abdomen and pelvis was performed following the standard protocol during bolus administration of intravenous contrast. CONTRAST:  179mL ISOVUE-300 IOPAMIDOL (ISOVUE-300) INJECTION 61% COMPARISON:  None. FINDINGS: CT CHEST FINDINGS Cardiovascular: Heart is normal in size.  No pericardial effusion. No evidence of thoracic aortic aneurysm. Mediastinum/Nodes: No suspicious mediastinal, hilar, or axillary lymphadenopathy. Visualized thyroid is unremarkable. Lungs/Pleura: Mild dependent atelectasis in the bilateral lower lobes and posterior left upper lobe. No suspicious pulmonary nodules. No focal consolidation. No pleural effusion or pneumothorax. Musculoskeletal: Visualized osseous structures are within normal limits. CT ABDOMEN PELVIS FINDINGS Hepatobiliary: 13 mm cyst in segment 5 of the liver (series 3/image 56). Layering gallstone (series 3/image 34). No associated inflammatory changes. No intrahepatic or extrahepatic ductal dilatation. Pancreas: Within normal limits. Spleen: Spleen is normal in size. Adrenals/Urinary Tract: Adrenal glands are within normal limits. 3 mm nonobstructing left lower pole renal calculus (series 3/image 70). Multiple left renal cysts, measuring up to  5.1 cm in the posterior left lower kidney (series 3/image 69), measuring simple fluid density (Bosniak I). Right kidney is within normal limits. No hydronephrosis. Bladder is within normal limits. Stomach/Bowel: Stomach is notable for a tiny hiatal hernia. No evidence of bowel obstruction. Normal appendix (series 3/image 92). Mild sigmoid diverticulosis, without evidence of diverticulitis. Vascular/Lymphatic: No evidence of abdominal aortic aneurysm. Atherosclerotic calcifications of the abdominal aorta and branch vessels. No suspicious abdominopelvic lymphadenopathy. Reproductive: Uterus is within normal limits. Bilateral ovaries are within normal limits. Other: No abdominopelvic ascites. Musculoskeletal: Mild degenerative changes of the lumbar spine. IMPRESSION: No findings suspicious for malignancy (primary or metastatic) in the chest, abdomen, or pelvis. No suspicious lymphadenopathy.  Spleen is normal in size. 3 mm nonobstructing left lower pole renal calculus. Multiple left renal cysts, benign (Bosniak I). Cholelithiasis, without associated inflammatory changes. Electronically Signed   By: Julian Hy M.D.   On: 01/01/2018 09:12   Mr Jodene Nam Head (cerebral Arteries)  Result Date: 01/01/2018 CLINICAL DATA:  Confusion and mass demonstrated on earlier head CT EXAM: MRI HEAD WITHOUT AND WITH CONTRAST MRA HEAD WITHOUT CONTRAST TECHNIQUE: Multiplanar, multiecho pulse sequences of the brain and surrounding structures were obtained without and with intravenous contrast. Angiographic images of the head were obtained using MRA technique without contrast. CONTRAST:  79mL MULTIHANCE GADOBENATE DIMEGLUMINE 529 MG/ML IV SOLN COMPARISON:  Head CT 12/31/2017 FINDINGS: MRI HEAD  FINDINGS Brain: The midline structures are normal. No focal diffusion restriction to indicate acute infarct. No intraparenchymal hemorrhage. Large amount of hyperintense T2-weighted signal in the left basal ganglia, left insula and left temporal  lobe. There is an area of contrast enhancement within the brain parenchyma following the left middle cerebral artery M1 and proximal M2 segments. The largest confluent area of enhancement measures 2.2 x 1.3 cm. Rightward midline shift of 6 mm is unchanged. No chronic microhemorrhage or cerebral amyloid angiopathy. No hydrocephalus, age advanced atrophy or lobar predominant volume loss. No dural abnormality or extra-axial collection. Skull and upper cervical spine: The visualized skull base, calvarium, upper cervical spine and extracranial soft tissues are normal. Sinuses/Orbits: No fluid levels or advanced mucosal thickening. No mastoid effusion. Normal orbits. MRA HEAD FINDINGS Intracranial internal carotid arteries: Normal. Anterior cerebral arteries: Normal. Middle cerebral arteries: Normal. Posterior communicating arteries: Present on the right. Posterior cerebral arteries: Normal. Fetal origin on the right. Basilar artery: Normal. Vertebral arteries: Left dominant. Normal. Superior cerebellar arteries: Normal. Anterior inferior cerebellar arteries: Normal. Posterior inferior cerebellar arteries: Normal. IMPRESSION: 1. Masslike parenchymal contrast enhancement adjacent to the left MCA M1 and proximal M2 segments with large area of surrounding hyperintense T2-weighted signal. The appearance is concerning for lymphoma -- including the intravascular variant -- with a large amount of surrounding edema. A primary CNS neoplasm, most likely a high grade glioma, is also a possibility, in which case the abnormal T2WI hyperintensity could indicate edema or non-enhancing tumor. Vasculitis is less likely in the context of normal MRA, but remains a consideration (MRA is less sensitive for detecting findings of vasculitis than CTA or conventional angiography). 2. Normal MRA. 3. Unchanged 6 mm rightward midline shift. Electronically Signed   By: Ulyses Jarred M.D.   On: 01/01/2018 00:39     DISCUSSION: This is a  67 year old who presented with altered mental status speech difficulties and confusion.  She was found to have a large ill-defined mass with substantial edema and subsequently has had seizure activity.  She is transferred to the ICU to ensure that her seizures are controlled in anticipation of a stereotactic biopsy.  She was taking no medications prior to admission appears to have no concurrent significant illness.  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  She continues on Decadron for now she has been loaded for Keppra for intermittent seizures.  I am anticipating a stereotactic biopsy.  I have ordered intermittent glucose monitoring while on high-dose Decadron.   Lars Masson. MD Pulmonary and [place] Pager: [phone]  01/01/2018, 4:35 PM

## 2018-01-01 NOTE — ED Notes (Signed)
Attempted to call radiology reading room for CT results. No answer after 3 minutes.

## 2018-01-01 NOTE — ED Notes (Signed)
Upon transferring patient to floor bed,  Pt presented with small but noticeable right side facial droop, along with right arm drift. Right hand arm strength slightly weaker than left hand. Floor nurse called in room and notified about change. Pt was able to ambulate to floor bed with assistance. Pt was still very "sleepy".

## 2018-01-01 NOTE — ED Notes (Signed)
Patient transported to CT 

## 2018-01-01 NOTE — Progress Notes (Signed)
Patient presents with complete rt side paralysis around 1015 am. Md notified, code stroke implemented, patient back in room now, on seizure precautions and keppra

## 2018-01-01 NOTE — Progress Notes (Addendum)
[facility]                                                                              Patient Demographics  Tara [name], is a 67 y.o. female, DOB - [birth year], ERD:[id number]  Admit date - 12/31/2017   Admitting Physician Jani Gravel, MD  Outpatient Primary MD for the patient is Patient, No Pcp Per  Outpatient specialists:   LOS - 1  days   Medical records reviewed and are as summarized below:    Chief Complaint  Patient presents with  . Altered Mental Status       Brief summary   Patient is a 67 year old female with history of hypertension, presented to ED with confusion, difficulty finishing sentences since the night before the admission. She also complained of slight left frontal headache. Denied any fevers, chills or reported. CT showed large amount of white matter edema in the left basal ganglia, left temporal lobe concerning for underlying neoplasm, 9 mm of left-to-right midline shift. Patient was admitted for further workup.    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 - consulted neuro-oncology, Dr. Mickeal Skinner as it appears to be primary CNS mass  - d/w Dr Aroor due to slurred speech and right sided weakness, stat CT head ordered. Per verbal report from Dr Aroor possibly TIA vs mass effect, no other changes. High risk for seizures, placed on keppra   Addendum 3:12 pm Spoke with Dr Vertell Limber, patient will need craniotomy and biopsy. Patient had 2 episodes of right sided paralysis, facial drooping, possibly having seizures.  Recommended transfer to Neuro ICU. If there is repeat seizure like activity, will increase keppra to 1000mg  Q12hrs.  - ordered EEG -  Decadron increased to 10mg  IV q6hrs. - Spoke with Dr Pearline Cables, PCCM, agreed with neuro ICU     Active Problems:   Headache, acute metabolic encephalopathy: - Improving however still has some confusion  - Likely due to #1, continue IV Decadron    Hypertension - Continue hydralazine as needed with parameters, amlodipine started, increased to 5 mg daily    Hyperglycemia - Follow hemoglobin A1c  Code Status: Full code DVT Prophylaxis:  Lovenox  Family Communication: Discussed in detail with the patient, all imaging results, lab results explained to the patient   Disposition Plan: Pending workup  Time Spent in minutes  35 minutes  Procedures:  MRI brain   Consultants:   Neuro surgery   Antimicrobials:      Medications  Scheduled Meds: . dexamethasone  4 mg Intravenous Q6H  . enoxaparin (LOVENOX) injection  40 mg Subcutaneous Q24H   Continuous Infusions: . sodium chloride 75 mL/hr at 01/01/18 0231   PRN Meds:.ondansetron (ZOFRAN) IV  Antibiotics   Anti-infectives (From admission, onward)   None        Subjective:   Tara [name] was seen and examined today.  Still somewhat confused, not able to comprehend completely. Patient denies dizziness, chest pain, shortness of breath, abdominal pain, N/V/D/C. No acute events overnight.  No fevers or chills  Objective:   Vitals:   01/01/18 0330 01/01/18 0359 01/01/18 0700 01/01/18 0929  BP: (!) 159/85 (!) 164/61 (!) 153/88 (!) 142/56  Pulse: 77 80 71 72  Resp: 11 14  18   Temp:  97.8 F (36.6 C) 97.7 F (36.5 C)   TempSrc:  Oral Oral Oral  SpO2: 97% 95%  95%  Weight:  96.9 kg (213 lb 10 oz)    Height:  5\' 6"  (1.676 m)      Intake/Output Summary (Last 24 hours) at 01/01/2018 1601 Last data filed at 01/01/2018 0504 Gross per 24 hour  Intake 191.25 ml  Output -  Net 191.25 ml      Wt Readings from Last 3 Encounters:  01/01/18 96.9 kg (213 lb 10 oz)     Exam  General: Alert and oriented x 2, self and place, no significant dysarthria  Eyes:   HEENT:    Cardiovascular: S1 S2 auscultated, Regular rate and rhythm.  Respiratory: Clear to auscultation bilaterally, no wheezing, rales or rhonchi  Gastrointestinal: Soft, nontender, nondistended, + bowel sounds  Ext: no pedal edema bilaterally  Neuro: right sided weakness, facial droop  Musculoskeletal: No digital cyanosis, clubbing  Skin: No rashes  Psych: still somewhat confused    Data Reviewed:  I have personally reviewed following labs and imaging studies  Micro Results No results found for this or any previous visit (from the past 240 hour(s)).  Radiology Reports Ct Head Wo Contrast  Result Date: 12/31/2017 CLINICAL DATA:  Altered level of consciousness. EXAM: CT HEAD WITHOUT CONTRAST TECHNIQUE: Contiguous axial images were obtained from the base of the skull through the vertex without intravenous contrast. COMPARISON:  None. FINDINGS: Brain: Large amount of white matter edema is seen in the left basal ganglia and temporal lobe. This is concerning for underlying neoplasm. This results in 9 mm of left-to-right midline shift. No ventricular dilatation is noted. No definite hemorrhage is noted. Vascular: No hyperdense vessel or unexpected calcification. Skull: Normal. Negative for fracture or focal lesion. Sinuses/Orbits: No acute finding. Other: None. IMPRESSION: Large amount of white matter edema is seen in the left basal ganglia and left temporal lobe concerning for underlying neoplasm. This results in 9 mm of left-to-right midline shift. Further evaluation with MRI with and without gadolinium administration is recommended. Electronically Signed   By: Marijo Conception, M.D.   On: 12/31/2017 21:32   Ct Chest W Contrast  Result Date: 01/01/2018 CLINICAL DATA:  Findings worrisome for CNS lymphoma versus  glioma brain MRI. For staging. EXAM: CT CHEST, ABDOMEN, AND PELVIS WITH CONTRAST TECHNIQUE: Multidetector CT imaging of the chest, abdomen and pelvis was performed following the standard protocol during bolus administration of intravenous contrast. CONTRAST:  138mL ISOVUE-300 IOPAMIDOL (ISOVUE-300) INJECTION 61% COMPARISON:  None. FINDINGS: CT CHEST FINDINGS Cardiovascular: Heart is normal in size.  No pericardial effusion. No evidence of thoracic aortic aneurysm. Mediastinum/Nodes: No suspicious mediastinal, hilar, or axillary lymphadenopathy. Visualized thyroid is unremarkable. Lungs/Pleura: Mild dependent atelectasis in the bilateral lower lobes and posterior left upper lobe. No suspicious pulmonary nodules. No focal consolidation. No pleural effusion or pneumothorax. Musculoskeletal: Visualized osseous structures are within normal limits. CT ABDOMEN  PELVIS FINDINGS Hepatobiliary: 13 mm cyst in segment 5 of the liver (series 3/image 56). Layering gallstone (series 3/image 34). No associated inflammatory changes. No intrahepatic or extrahepatic ductal dilatation. Pancreas: Within normal limits. Spleen: Spleen is normal in size. Adrenals/Urinary Tract: Adrenal glands are within normal limits. 3 mm nonobstructing left lower pole renal calculus (series 3/image 70). Multiple left renal cysts, measuring up to 5.1 cm in the posterior left lower kidney (series 3/image 69), measuring simple fluid density (Bosniak I). Right kidney is within normal limits. No hydronephrosis. Bladder is within normal limits. Stomach/Bowel: Stomach is notable for a tiny hiatal hernia. No evidence of bowel obstruction. Normal appendix (series 3/image 92). Mild sigmoid diverticulosis, without evidence of diverticulitis. Vascular/Lymphatic: No evidence of abdominal aortic aneurysm. Atherosclerotic calcifications of the abdominal aorta and branch vessels. No suspicious abdominopelvic lymphadenopathy. Reproductive: Uterus is within normal limits.  Bilateral ovaries are within normal limits. Other: No abdominopelvic ascites. Musculoskeletal: Mild degenerative changes of the lumbar spine. IMPRESSION: No findings suspicious for malignancy (primary or metastatic) in the chest, abdomen, or pelvis. No suspicious lymphadenopathy.  Spleen is normal in size. 3 mm nonobstructing left lower pole renal calculus. Multiple left renal cysts, benign (Bosniak I). Cholelithiasis, without associated inflammatory changes. Electronically Signed   By: Julian Hy M.D.   On: 01/01/2018 09:12   Mr Brain W And Wo Contrast  Result Date: 01/01/2018 CLINICAL DATA:  Confusion and mass demonstrated on earlier head CT EXAM: MRI HEAD WITHOUT AND WITH CONTRAST MRA HEAD WITHOUT CONTRAST TECHNIQUE: Multiplanar, multiecho pulse sequences of the brain and surrounding structures were obtained without and with intravenous contrast. Angiographic images of the head were obtained using MRA technique without contrast. CONTRAST:  47mL MULTIHANCE GADOBENATE DIMEGLUMINE 529 MG/ML IV SOLN COMPARISON:  Head CT 12/31/2017 FINDINGS: MRI HEAD FINDINGS Brain: The midline structures are normal. No focal diffusion restriction to indicate acute infarct. No intraparenchymal hemorrhage. Large amount of hyperintense T2-weighted signal in the left basal ganglia, left insula and left temporal lobe. There is an area of contrast enhancement within the brain parenchyma following the left middle cerebral artery M1 and proximal M2 segments. The largest confluent area of enhancement measures 2.2 x 1.3 cm. Rightward midline shift of 6 mm is unchanged. No chronic microhemorrhage or cerebral amyloid angiopathy. No hydrocephalus, age advanced atrophy or lobar predominant volume loss. No dural abnormality or extra-axial collection. Skull and upper cervical spine: The visualized skull base, calvarium, upper cervical spine and extracranial soft tissues are normal. Sinuses/Orbits: No fluid levels or advanced mucosal  thickening. No mastoid effusion. Normal orbits. MRA HEAD FINDINGS Intracranial internal carotid arteries: Normal. Anterior cerebral arteries: Normal. Middle cerebral arteries: Normal. Posterior communicating arteries: Present on the right. Posterior cerebral arteries: Normal. Fetal origin on the right. Basilar artery: Normal. Vertebral arteries: Left dominant. Normal. Superior cerebellar arteries: Normal. Anterior inferior cerebellar arteries: Normal. Posterior inferior cerebellar arteries: Normal. IMPRESSION: 1. Masslike parenchymal contrast enhancement adjacent to the left MCA M1 and proximal M2 segments with large area of surrounding hyperintense T2-weighted signal. The appearance is concerning for lymphoma -- including the intravascular variant -- with a large amount of surrounding edema. A primary CNS neoplasm, most likely a high grade glioma, is also a possibility, in which case the abnormal T2WI hyperintensity could indicate edema or non-enhancing tumor. Vasculitis is less likely in the context of normal MRA, but remains a consideration (MRA is less sensitive for detecting findings of vasculitis than CTA or conventional angiography). 2. Normal MRA. 3. Unchanged 6 mm rightward midline  shift. Electronically Signed   By: Ulyses Jarred M.D.   On: 01/01/2018 00:39   Ct Abdomen Pelvis W Contrast  Result Date: 01/01/2018 CLINICAL DATA:  Findings worrisome for CNS lymphoma versus glioma brain MRI. For staging. EXAM: CT CHEST, ABDOMEN, AND PELVIS WITH CONTRAST TECHNIQUE: Multidetector CT imaging of the chest, abdomen and pelvis was performed following the standard protocol during bolus administration of intravenous contrast. CONTRAST:  165mL ISOVUE-300 IOPAMIDOL (ISOVUE-300) INJECTION 61% COMPARISON:  None. FINDINGS: CT CHEST FINDINGS Cardiovascular: Heart is normal in size.  No pericardial effusion. No evidence of thoracic aortic aneurysm. Mediastinum/Nodes: No suspicious mediastinal, hilar, or axillary  lymphadenopathy. Visualized thyroid is unremarkable. Lungs/Pleura: Mild dependent atelectasis in the bilateral lower lobes and posterior left upper lobe. No suspicious pulmonary nodules. No focal consolidation. No pleural effusion or pneumothorax. Musculoskeletal: Visualized osseous structures are within normal limits. CT ABDOMEN PELVIS FINDINGS Hepatobiliary: 13 mm cyst in segment 5 of the liver (series 3/image 56). Layering gallstone (series 3/image 34). No associated inflammatory changes. No intrahepatic or extrahepatic ductal dilatation. Pancreas: Within normal limits. Spleen: Spleen is normal in size. Adrenals/Urinary Tract: Adrenal glands are within normal limits. 3 mm nonobstructing left lower pole renal calculus (series 3/image 70). Multiple left renal cysts, measuring up to 5.1 cm in the posterior left lower kidney (series 3/image 69), measuring simple fluid density (Bosniak I). Right kidney is within normal limits. No hydronephrosis. Bladder is within normal limits. Stomach/Bowel: Stomach is notable for a tiny hiatal hernia. No evidence of bowel obstruction. Normal appendix (series 3/image 92). Mild sigmoid diverticulosis, without evidence of diverticulitis. Vascular/Lymphatic: No evidence of abdominal aortic aneurysm. Atherosclerotic calcifications of the abdominal aorta and branch vessels. No suspicious abdominopelvic lymphadenopathy. Reproductive: Uterus is within normal limits. Bilateral ovaries are within normal limits. Other: No abdominopelvic ascites. Musculoskeletal: Mild degenerative changes of the lumbar spine. IMPRESSION: No findings suspicious for malignancy (primary or metastatic) in the chest, abdomen, or pelvis. No suspicious lymphadenopathy.  Spleen is normal in size. 3 mm nonobstructing left lower pole renal calculus. Multiple left renal cysts, benign (Bosniak I). Cholelithiasis, without associated inflammatory changes. Electronically Signed   By: Julian Hy M.D.   On: 01/01/2018  09:12   Mr Jodene Nam Head (cerebral Arteries)  Result Date: 01/01/2018 CLINICAL DATA:  Confusion and mass demonstrated on earlier head CT EXAM: MRI HEAD WITHOUT AND WITH CONTRAST MRA HEAD WITHOUT CONTRAST TECHNIQUE: Multiplanar, multiecho pulse sequences of the brain and surrounding structures were obtained without and with intravenous contrast. Angiographic images of the head were obtained using MRA technique without contrast. CONTRAST:  39mL MULTIHANCE GADOBENATE DIMEGLUMINE 529 MG/ML IV SOLN COMPARISON:  Head CT 12/31/2017 FINDINGS: MRI HEAD FINDINGS Brain: The midline structures are normal. No focal diffusion restriction to indicate acute infarct. No intraparenchymal hemorrhage. Large amount of hyperintense T2-weighted signal in the left basal ganglia, left insula and left temporal lobe. There is an area of contrast enhancement within the brain parenchyma following the left middle cerebral artery M1 and proximal M2 segments. The largest confluent area of enhancement measures 2.2 x 1.3 cm. Rightward midline shift of 6 mm is unchanged. No chronic microhemorrhage or cerebral amyloid angiopathy. No hydrocephalus, age advanced atrophy or lobar predominant volume loss. No dural abnormality or extra-axial collection. Skull and upper cervical spine: The visualized skull base, calvarium, upper cervical spine and extracranial soft tissues are normal. Sinuses/Orbits: No fluid levels or advanced mucosal thickening. No mastoid effusion. Normal orbits. MRA HEAD FINDINGS Intracranial internal carotid arteries: Normal. Anterior cerebral arteries: Normal. Middle  cerebral arteries: Normal. Posterior communicating arteries: Present on the right. Posterior cerebral arteries: Normal. Fetal origin on the right. Basilar artery: Normal. Vertebral arteries: Left dominant. Normal. Superior cerebellar arteries: Normal. Anterior inferior cerebellar arteries: Normal. Posterior inferior cerebellar arteries: Normal. IMPRESSION: 1. Masslike  parenchymal contrast enhancement adjacent to the left MCA M1 and proximal M2 segments with large area of surrounding hyperintense T2-weighted signal. The appearance is concerning for lymphoma -- including the intravascular variant -- with a large amount of surrounding edema. A primary CNS neoplasm, most likely a high grade glioma, is also a possibility, in which case the abnormal T2WI hyperintensity could indicate edema or non-enhancing tumor. Vasculitis is less likely in the context of normal MRA, but remains a consideration (MRA is less sensitive for detecting findings of vasculitis than CTA or conventional angiography). 2. Normal MRA. 3. Unchanged 6 mm rightward midline shift. Electronically Signed   By: Ulyses Jarred M.D.   On: 01/01/2018 00:39    Lab Data:  CBC: Recent Labs  Lab 12/31/17 1638 12/31/17 2204 01/01/18 0209  WBC 9.4  --  11.3*  NEUTROABS 7.9*  --   --   HGB 15.3* 13.9 14.0  HCT 45.3 41.0 41.0  MCV 93.4  --  92.3  PLT 424*  --  433   Basic Metabolic Panel: Recent Labs  Lab 12/31/17 1638 12/31/17 2204 01/01/18 0209  NA 136 135 133*  K 3.6 4.5 3.8  CL 101 103 101  CO2 24  --  21*  GLUCOSE 115* 110* 147*  BUN 17 24* 15  CREATININE 0.74 0.60 0.62  CALCIUM 9.2  --  9.3   GFR: Estimated Creatinine Clearance: 81.1 mL/min (by C-G formula based on SCr of 0.62 mg/dL). Liver Function Tests: Recent Labs  Lab 12/31/17 1638 01/01/18 0209  AST 15 15  ALT 14 17  ALKPHOS 118 113  BILITOT 0.7 0.6  PROT 7.2 6.5  ALBUMIN 4.1 3.7   No results for input(s): LIPASE, AMYLASE in the last 168 hours. No results for input(s): AMMONIA in the last 168 hours. Coagulation Profile: Recent Labs  Lab 12/31/17 2156  INR 1.19   Cardiac Enzymes: No results for input(s): CKTOTAL, CKMB, CKMBINDEX, TROPONINI in the last 168 hours. BNP (last 3 results) No results for input(s): PROBNP in the last 8760 hours. HbA1C: No results for input(s): HGBA1C in the last 72 hours. CBG: Recent  Labs  Lab 12/31/17 1643  GLUCAP 101*   Lipid Profile: No results for input(s): CHOL, HDL, LDLCALC, TRIG, CHOLHDL, LDLDIRECT in the last 72 hours. Thyroid Function Tests: No results for input(s): TSH, T4TOTAL, FREET4, T3FREE, THYROIDAB in the last 72 hours. Anemia Panel: Recent Labs    01/01/18 0209  VITAMINB12 274   Urine analysis:    Component Value Date/Time   COLORURINE YELLOW 01/01/2018 0152   APPEARANCEUR HAZY (A) 01/01/2018 0152   LABSPEC 1.024 01/01/2018 0152   PHURINE 5.0 01/01/2018 0152   GLUCOSEU NEGATIVE 01/01/2018 0152   HGBUR LARGE (A) 01/01/2018 0152   BILIRUBINUR NEGATIVE 01/01/2018 [place] 01/01/2018 0152   PROTEINUR NEGATIVE 01/01/2018 0152   NITRITE NEGATIVE 01/01/2018 0152   LEUKOCYTESUR MODERATE (A) 01/01/2018 0152     Farrel Guimond M.D. [facility] 01/01/2018, 9:37 AM  Pager: [phone] Between 7am to 7pm - call Pager - [phone]  After 7pm go to [email or web address] - password [email or web address]  Call night coverage person covering after 7pm

## 2018-01-01 NOTE — Consult Note (Addendum)
Reason for Consult:Brain mass Referring Physician: Jeaneen Cala is an 67 y.o. female.  HPI: Patient is a 67 year old female with history of hypertension, presented to ED with confusion, difficulty finishing sentences since the night before the admission. She also complained of slight left frontal headache. Denied any fevers, chills or reported. CT showed large amount of white matter edema in the left basal ganglia, left temporal lobe concerning for underlying neoplasm, 9 mm of left-to-right midline shift. Patient was admitted for further workup.  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  History reviewed. No pertinent past medical history.  History reviewed. No pertinent surgical history.  Family History  Problem Relation Age of Onset  . Colon cancer Mother   . CAD Father     Social History:  reports that she has been smoking cigarettes.  She has been smoking about 1.00 pack per day. she has never used smokeless tobacco. She reports that she does not drink alcohol or use drugs.  Allergies: No Known Allergies  Medications: I have reviewed the patient's current medications.  Results for orders placed or performed during the hospital encounter of 12/31/17 (from the past 48 hour(s))  Comprehensive metabolic panel     Status: Abnormal   Collection Time: 12/31/17  4:38 PM  Result Value Ref Range   Sodium 136 135 - 145 mmol/L   Potassium 3.6 3.5 - 5.1 mmol/L   Chloride 101 101 - 111 mmol/L   CO2 24 22 - 32 mmol/L   Glucose, Bld 115 (H) 65 - 99 mg/dL   BUN 17 6 - 20 mg/dL   Creatinine, Ser 0.74 0.44 - 1.00 mg/dL   Calcium 9.2 8.9 - 10.3 mg/dL   Total Protein 7.2 6.5 - 8.1 g/dL   Albumin 4.1 3.5 - 5.0 g/dL   AST 15 15 - 41 U/L   ALT 14 14 - 54 U/L   Alkaline Phosphatase 118 38 - 126 U/L   Total Bilirubin 0.7 0.3 - 1.2 mg/dL   GFR calc non Af Amer >60 >60 mL/min   GFR calc Af Amer >60  >60 mL/min    Comment: (NOTE) The eGFR has been calculated using the CKD EPI equation. This calculation has not been validated in all clinical situations. eGFR's persistently <60 mL/min signify possible Chronic Kidney Disease.    Anion gap 11 5 - 15    Comment: Performed at [facility] [address], [place], [place] [address]  CBC     Status: Abnormal   Collection Time: 12/31/17  4:38 PM  Result Value Ref Range   WBC 9.4 4.0 - 10.5 K/uL   RBC 4.85 3.87 - 5.11 MIL/uL   Hemoglobin 15.3 (H) 12.0 - 15.0 g/dL   HCT 45.3 36.0 - 46.0 %   MCV 93.4 78.0 - 100.0 fL   MCH 31.5 26.0 - 34.0 pg   MCHC 33.8 30.0 - 36.0 g/dL   RDW 13.1 11.5 - 15.5 %   Platelets 424 (H) 150 - 400 K/uL    Comment: Performed at [facility] [address] [place], [place] [address]  Differential     Status: Abnormal   Collection Time: 12/31/17  4:38 PM  Result Value Ref Range   Neutrophils Relative % 77 %   Neutro Abs 7.9 (H) 1.7 - 7.7 K/uL   Lymphocytes Relative 13 %   Lymphs Abs 1.4 0.7 - 4.0 K/uL  Monocytes Relative 9 %   Monocytes Absolute 0.9 0.1 - 1.0 K/uL   Eosinophils Relative 1 %   Eosinophils Absolute 0.1 0.0 - 0.7 K/uL   Basophils Relative 0 %   Basophils Absolute 0.0 0.0 - 0.1 K/uL    Comment: Performed at [facility] [address] [place], [place] [address]  CBG monitoring, ED     Status: Abnormal   Collection Time: 12/31/17  4:43 PM  Result Value Ref Range   Glucose-Capillary 101 (H) 65 - 99 mg/dL   Comment 1 Notify RN    Comment 2 Document in Chart   Ethanol     Status: None   Collection Time: 12/31/17  9:56 PM  Result Value Ref Range   Alcohol, Ethyl (B) <10 <10 mg/dL    Comment:        LOWEST DETECTABLE LIMIT FOR SERUM ALCOHOL IS 10 mg/dL FOR MEDICAL PURPOSES ONLY Performed at [facility], [place] [address], [place], [place] [address]   Protime-INR     Status: None   Collection Time: 12/31/17  9:56 PM  Result Value Ref Range   Prothrombin Time 15.0  11.4 - 15.2 seconds   INR 1.19     Comment: Performed at [facility] [address], [place], [place] [address]  APTT     Status: Abnormal   Collection Time: 12/31/17  9:56 PM  Result Value Ref Range   aPTT 40 (H) 24 - 36 seconds    Comment:        IF BASELINE aPTT IS ELEVATED, SUGGEST PATIENT RISK ASSESSMENT BE USED TO DETERMINE APPROPRIATE ANTICOAGULANT THERAPY. Performed at [facility], [place] [address], [place], [place] [address]   I-stat troponin, ED     Status: None   Collection Time: 12/31/17 10:02 PM  Result Value Ref Range   Troponin i, poc 0.00 0.00 - 0.08 ng/mL   Comment 3            Comment: Due to the release kinetics of cTnI, a negative result within the first hours of the onset of symptoms does not rule out myocardial infarction with certainty. If myocardial infarction is still suspected, repeat the test at appropriate intervals.   I-Stat Chem 8, ED     Status: Abnormal   Collection Time: 12/31/17 10:04 PM  Result Value Ref Range   Sodium 135 135 - 145 mmol/L   Potassium 4.5 3.5 - 5.1 mmol/L   Chloride 103 101 - 111 mmol/L   BUN 24 (H) 6 - 20 mg/dL   Creatinine, Ser 0.60 0.44 - 1.00 mg/dL   Glucose, Bld 110 (H) 65 - 99 mg/dL   Calcium, Ion 1.01 (L) 1.15 - 1.40 mmol/L   TCO2 23 22 - 32 mmol/L   Hemoglobin 13.9 12.0 - 15.0 g/dL   HCT 41.0 36.0 - 46.0 %  Urine rapid drug screen (hosp performed)     Status: None   Collection Time: 01/01/18  1:52 AM  Result Value Ref Range   Opiates NONE DETECTED NONE DETECTED   Cocaine NONE DETECTED NONE DETECTED   Benzodiazepines NONE DETECTED NONE DETECTED   Amphetamines NONE DETECTED NONE DETECTED   Tetrahydrocannabinol NONE DETECTED NONE DETECTED   Barbiturates NONE DETECTED NONE DETECTED    Comment: (NOTE) DRUG SCREEN FOR MEDICAL PURPOSES ONLY.  IF CONFIRMATION IS NEEDED FOR ANY PURPOSE, NOTIFY LAB WITHIN 5 DAYS. LOWEST DETECTABLE LIMITS FOR URINE DRUG SCREEN Drug Class  Cutoff  (ng/mL) Amphetamine and metabolites    1000 Barbiturate and metabolites    200 Benzodiazepine                 132 Tricyclics and metabolites     300 Opiates and metabolites        300 Cocaine and metabolites        300 THC                            50 Performed at [facility], [place] [address], [place], [place] [address]   Urinalysis, Routine w reflex microscopic     Status: Abnormal   Collection Time: 01/01/18  1:52 AM  Result Value Ref Range   Color, Urine YELLOW YELLOW   APPearance HAZY (A) CLEAR   Specific Gravity, Urine 1.024 1.005 - 1.030   pH 5.0 5.0 - 8.0   Glucose, UA NEGATIVE NEGATIVE mg/dL   Hgb urine dipstick LARGE (A) NEGATIVE   Bilirubin Urine NEGATIVE NEGATIVE   Ketones, ur NEGATIVE NEGATIVE mg/dL   Protein, ur NEGATIVE NEGATIVE mg/dL   Nitrite NEGATIVE NEGATIVE   Leukocytes, UA MODERATE (A) NEGATIVE   RBC / HPF 6-30 0 - 5 RBC/hpf   WBC, UA TOO NUMEROUS TO COUNT 0 - 5 WBC/hpf   Bacteria, UA RARE (A) NONE SEEN   Squamous Epithelial / LPF 0-5 (A) NONE SEEN    Comment: Performed at [facility], [place] [address] [place], [place] [address]  Lactate dehydrogenase     Status: None   Collection Time: 01/01/18  1:57 AM  Result Value Ref Range   LDH 150 98 - 192 U/L    Comment: Performed at [facility], [place] [address], [place], [place] [address]  CBC     Status: Abnormal   Collection Time: 01/01/18  2:09 AM  Result Value Ref Range   WBC 11.3 (H) 4.0 - 10.5 K/uL   RBC 4.44 3.87 - 5.11 MIL/uL   Hemoglobin 14.0 12.0 - 15.0 g/dL   HCT 41.0 36.0 - 46.0 %   MCV 92.3 78.0 - 100.0 fL   MCH 31.5 26.0 - 34.0 pg   MCHC 34.1 30.0 - 36.0 g/dL   RDW 12.8 11.5 - 15.5 %   Platelets 378 150 - 400 K/uL    Comment: Performed at [facility], [place] [address], [place], [place] [address]  Comprehensive metabolic panel     Status: Abnormal   Collection Time: 01/01/18  2:09 AM  Result Value Ref Range   Sodium 133 (L) 135 - 145 mmol/L   Potassium 3.8 3.5 - 5.1  mmol/L   Chloride 101 101 - 111 mmol/L   CO2 21 (L) 22 - 32 mmol/L   Glucose, Bld 147 (H) 65 - 99 mg/dL   BUN 15 6 - 20 mg/dL   Creatinine, Ser 0.62 0.44 - 1.00 mg/dL   Calcium 9.3 8.9 - 10.3 mg/dL   Total Protein 6.5 6.5 - 8.1 g/dL   Albumin 3.7 3.5 - 5.0 g/dL   AST 15 15 - 41 U/L   ALT 17 14 - 54 U/L   Alkaline Phosphatase 113 38 - 126 U/L   Total Bilirubin 0.6 0.3 - 1.2 mg/dL   GFR calc non Af Amer >60 >60 mL/min   GFR calc Af Amer >60 >60 mL/min    Comment: (NOTE) The eGFR has been calculated using the CKD EPI  equation. This calculation has not been validated in all clinical situations. eGFR's persistently <60 mL/min signify possible Chronic Kidney Disease.    Anion gap 11 5 - 15    Comment: Performed at [facility] [address] [place], [place] [address]  Vitamin B12     Status: None   Collection Time: 01/01/18  2:09 AM  Result Value Ref Range   Vitamin B-12 274 180 - 914 pg/mL    Comment: (NOTE) This assay is not validated for testing neonatal or myeloproliferative syndrome specimens for Vitamin B12 levels. Performed at [facility], [place] [address] [place], [place] [address]   Sedimentation rate     Status: Abnormal   Collection Time: 01/01/18  2:09 AM  Result Value Ref Range   Sed Rate 29 (H) 0 - 22 mm/hr    Comment: Performed at [facility] [address] [place], [place] [address]    Ct Head Wo Contrast  Result Date: 01/01/2018 CLINICAL DATA:  Slurred speech and right-sided weakness. EXAM: CT HEAD WITHOUT CONTRAST TECHNIQUE: Contiguous axial images were obtained from the base of the skull through the vertex without intravenous contrast. COMPARISON:  Head CT and brain MRI dated 12/31/2017 FINDINGS: Brain: The masslike area of heterogeneous attenuation, primarily hypoattenuation, surrounding the central left middle cerebral artery and extending from the left temporal lobe through the base a ganglia to the left centrum semiovale, is without  change from the previous day's exams. There is still mild midline shift to the right currently measuring 4 mm. There are no new areas of abnormal parenchymal attenuation and no new areas of mass effect. No intracranial hemorrhage. Vascular: No hyperdense vessel or unexpected calcification. Skull: Normal. Negative for fracture or focal lesion. Sinuses/Orbits: Globes and orbits are unremarkable. Sinuses and mastoid air cells are clear. Other: None. IMPRESSION: 1. No significant change from the previous day's exams. 2. Masslike area of abnormal attenuation, primarily hypoattenuation, with associated mass effect, on the left, centered on the left basal ganglia and surrounding the left middle cerebral artery, is unchanged in extent and degree of mass effect. 3. No new abnormalities.  No intracranial hemorrhage. Electronically Signed   By: Lajean Manes M.D.   On: 01/01/2018 11:35   Ct Head Wo Contrast  Result Date: 12/31/2017 CLINICAL DATA:  Altered level of consciousness. EXAM: CT HEAD WITHOUT CONTRAST TECHNIQUE: Contiguous axial images were obtained from the base of the skull through the vertex without intravenous contrast. COMPARISON:  None. FINDINGS: Brain: Large amount of white matter edema is seen in the left basal ganglia and temporal lobe. This is concerning for underlying neoplasm. This results in 9 mm of left-to-right midline shift. No ventricular dilatation is noted. No definite hemorrhage is noted. Vascular: No hyperdense vessel or unexpected calcification. Skull: Normal. Negative for fracture or focal lesion. Sinuses/Orbits: No acute finding. Other: None. IMPRESSION: Large amount of white matter edema is seen in the left basal ganglia and left temporal lobe concerning for underlying neoplasm. This results in 9 mm of left-to-right midline shift. Further evaluation with MRI with and without gadolinium administration is recommended. Electronically Signed   By: Marijo Conception, M.D.   On: 12/31/2017 21:32    Ct Chest W Contrast  Result Date: 01/01/2018 CLINICAL DATA:  Findings worrisome for CNS lymphoma versus glioma brain MRI. For staging. EXAM: CT CHEST, ABDOMEN, AND PELVIS WITH CONTRAST TECHNIQUE: Multidetector CT imaging of the chest, abdomen and pelvis was performed following the standard protocol during  bolus administration of intravenous contrast. CONTRAST:  155m ISOVUE-300 IOPAMIDOL (ISOVUE-300) INJECTION 61% COMPARISON:  None. FINDINGS: CT CHEST FINDINGS Cardiovascular: Heart is normal in size.  No pericardial effusion. No evidence of thoracic aortic aneurysm. Mediastinum/Nodes: No suspicious mediastinal, hilar, or axillary lymphadenopathy. Visualized thyroid is unremarkable. Lungs/Pleura: Mild dependent atelectasis in the bilateral lower lobes and posterior left upper lobe. No suspicious pulmonary nodules. No focal consolidation. No pleural effusion or pneumothorax. Musculoskeletal: Visualized osseous structures are within normal limits. CT ABDOMEN PELVIS FINDINGS Hepatobiliary: 13 mm cyst in segment 5 of the liver (series 3/image 56). Layering gallstone (series 3/image 34). No associated inflammatory changes. No intrahepatic or extrahepatic ductal dilatation. Pancreas: Within normal limits. Spleen: Spleen is normal in size. Adrenals/Urinary Tract: Adrenal glands are within normal limits. 3 mm nonobstructing left lower pole renal calculus (series 3/image 70). Multiple left renal cysts, measuring up to 5.1 cm in the posterior left lower kidney (series 3/image 69), measuring simple fluid density (Bosniak I). Right kidney is within normal limits. No hydronephrosis. Bladder is within normal limits. Stomach/Bowel: Stomach is notable for a tiny hiatal hernia. No evidence of bowel obstruction. Normal appendix (series 3/image 92). Mild sigmoid diverticulosis, without evidence of diverticulitis. Vascular/Lymphatic: No evidence of abdominal aortic aneurysm. Atherosclerotic calcifications of the abdominal aorta and  branch vessels. No suspicious abdominopelvic lymphadenopathy. Reproductive: Uterus is within normal limits. Bilateral ovaries are within normal limits. Other: No abdominopelvic ascites. Musculoskeletal: Mild degenerative changes of the lumbar spine. IMPRESSION: No findings suspicious for malignancy (primary or metastatic) in the chest, abdomen, or pelvis. No suspicious lymphadenopathy.  Spleen is normal in size. 3 mm nonobstructing left lower pole renal calculus. Multiple left renal cysts, benign (Bosniak I). Cholelithiasis, without associated inflammatory changes. Electronically Signed   By: SJulian HyM.D.   On: 01/01/2018 09:12   Mr Brain W And Wo Contrast  Result Date: 01/01/2018 CLINICAL DATA:  Confusion and mass demonstrated on earlier head CT EXAM: MRI HEAD WITHOUT AND WITH CONTRAST MRA HEAD WITHOUT CONTRAST TECHNIQUE: Multiplanar, multiecho pulse sequences of the brain and surrounding structures were obtained without and with intravenous contrast. Angiographic images of the head were obtained using MRA technique without contrast. CONTRAST:  223mMULTIHANCE GADOBENATE DIMEGLUMINE 529 MG/ML IV SOLN COMPARISON:  Head CT 12/31/2017 FINDINGS: MRI HEAD FINDINGS Brain: The midline structures are normal. No focal diffusion restriction to indicate acute infarct. No intraparenchymal hemorrhage. Large amount of hyperintense T2-weighted signal in the left basal ganglia, left insula and left temporal lobe. There is an area of contrast enhancement within the brain parenchyma following the left middle cerebral artery M1 and proximal M2 segments. The largest confluent area of enhancement measures 2.2 x 1.3 cm. Rightward midline shift of 6 mm is unchanged. No chronic microhemorrhage or cerebral amyloid angiopathy. No hydrocephalus, age advanced atrophy or lobar predominant volume loss. No dural abnormality or extra-axial collection. Skull and upper cervical spine: The visualized skull base, calvarium, upper  cervical spine and extracranial soft tissues are normal. Sinuses/Orbits: No fluid levels or advanced mucosal thickening. No mastoid effusion. Normal orbits. MRA HEAD FINDINGS Intracranial internal carotid arteries: Normal. Anterior cerebral arteries: Normal. Middle cerebral arteries: Normal. Posterior communicating arteries: Present on the right. Posterior cerebral arteries: Normal. Fetal origin on the right. Basilar artery: Normal. Vertebral arteries: Left dominant. Normal. Superior cerebellar arteries: Normal. Anterior inferior cerebellar arteries: Normal. Posterior inferior cerebellar arteries: Normal. IMPRESSION: 1. Masslike parenchymal contrast enhancement adjacent to the left MCA M1 and proximal M2 segments with large area of surrounding hyperintense T2-weighted  signal. The appearance is concerning for lymphoma -- including the intravascular variant -- with a large amount of surrounding edema. A primary CNS neoplasm, most likely a high grade glioma, is also a possibility, in which case the abnormal T2WI hyperintensity could indicate edema or non-enhancing tumor. Vasculitis is less likely in the context of normal MRA, but remains a consideration (MRA is less sensitive for detecting findings of vasculitis than CTA or conventional angiography). 2. Normal MRA. 3. Unchanged 6 mm rightward midline shift. Electronically Signed   By: Ulyses Jarred M.D.   On: 01/01/2018 00:39   Ct Abdomen Pelvis W Contrast  Result Date: 01/01/2018 CLINICAL DATA:  Findings worrisome for CNS lymphoma versus glioma brain MRI. For staging. EXAM: CT CHEST, ABDOMEN, AND PELVIS WITH CONTRAST TECHNIQUE: Multidetector CT imaging of the chest, abdomen and pelvis was performed following the standard protocol during bolus administration of intravenous contrast. CONTRAST:  137m ISOVUE-300 IOPAMIDOL (ISOVUE-300) INJECTION 61% COMPARISON:  None. FINDINGS: CT CHEST FINDINGS Cardiovascular: Heart is normal in size.  No pericardial effusion. No  evidence of thoracic aortic aneurysm. Mediastinum/Nodes: No suspicious mediastinal, hilar, or axillary lymphadenopathy. Visualized thyroid is unremarkable. Lungs/Pleura: Mild dependent atelectasis in the bilateral lower lobes and posterior left upper lobe. No suspicious pulmonary nodules. No focal consolidation. No pleural effusion or pneumothorax. Musculoskeletal: Visualized osseous structures are within normal limits. CT ABDOMEN PELVIS FINDINGS Hepatobiliary: 13 mm cyst in segment 5 of the liver (series 3/image 56). Layering gallstone (series 3/image 34). No associated inflammatory changes. No intrahepatic or extrahepatic ductal dilatation. Pancreas: Within normal limits. Spleen: Spleen is normal in size. Adrenals/Urinary Tract: Adrenal glands are within normal limits. 3 mm nonobstructing left lower pole renal calculus (series 3/image 70). Multiple left renal cysts, measuring up to 5.1 cm in the posterior left lower kidney (series 3/image 69), measuring simple fluid density (Bosniak I). Right kidney is within normal limits. No hydronephrosis. Bladder is within normal limits. Stomach/Bowel: Stomach is notable for a tiny hiatal hernia. No evidence of bowel obstruction. Normal appendix (series 3/image 92). Mild sigmoid diverticulosis, without evidence of diverticulitis. Vascular/Lymphatic: No evidence of abdominal aortic aneurysm. Atherosclerotic calcifications of the abdominal aorta and branch vessels. No suspicious abdominopelvic lymphadenopathy. Reproductive: Uterus is within normal limits. Bilateral ovaries are within normal limits. Other: No abdominopelvic ascites. Musculoskeletal: Mild degenerative changes of the lumbar spine. IMPRESSION: No findings suspicious for malignancy (primary or metastatic) in the chest, abdomen, or pelvis. No suspicious lymphadenopathy.  Spleen is normal in size. 3 mm nonobstructing left lower pole renal calculus. Multiple left renal cysts, benign (Bosniak I). Cholelithiasis, without  associated inflammatory changes. Electronically Signed   By: SJulian HyM.D.   On: 01/01/2018 09:12   Mr Tara [name] (cerebral Arteries)  Result Date: 01/01/2018 CLINICAL DATA:  Confusion and mass demonstrated on earlier head CT EXAM: MRI HEAD WITHOUT AND WITH CONTRAST MRA HEAD WITHOUT CONTRAST TECHNIQUE: Multiplanar, multiecho pulse sequences of the brain and surrounding structures were obtained without and with intravenous contrast. Angiographic images of the head were obtained using MRA technique without contrast. CONTRAST:  220mMULTIHANCE GADOBENATE DIMEGLUMINE 529 MG/ML IV SOLN COMPARISON:  Head CT 12/31/2017 FINDINGS: MRI HEAD FINDINGS Brain: The midline structures are normal. No focal diffusion restriction to indicate acute infarct. No intraparenchymal hemorrhage. Large amount of hyperintense T2-weighted signal in the left basal ganglia, left insula and left temporal lobe. There is an area of contrast enhancement within the brain parenchyma following the left middle cerebral artery M1 and proximal M2 segments. The largest confluent  area of enhancement measures 2.2 x 1.3 cm. Rightward midline shift of 6 mm is unchanged. No chronic microhemorrhage or cerebral amyloid angiopathy. No hydrocephalus, age advanced atrophy or lobar predominant volume loss. No dural abnormality or extra-axial collection. Skull and upper cervical spine: The visualized skull base, calvarium, upper cervical spine and extracranial soft tissues are normal. Sinuses/Orbits: No fluid levels or advanced mucosal thickening. No mastoid effusion. Normal orbits. MRA HEAD FINDINGS Intracranial internal carotid arteries: Normal. Anterior cerebral arteries: Normal. Middle cerebral arteries: Normal. Posterior communicating arteries: Present on the right. Posterior cerebral arteries: Normal. Fetal origin on the right. Basilar artery: Normal. Vertebral arteries: Left dominant. Normal. Superior cerebellar arteries: Normal. Anterior inferior  cerebellar arteries: Normal. Posterior inferior cerebellar arteries: Normal. IMPRESSION: 1. Masslike parenchymal contrast enhancement adjacent to the left MCA M1 and proximal M2 segments with large area of surrounding hyperintense T2-weighted signal. The appearance is concerning for lymphoma -- including the intravascular variant -- with a large amount of surrounding edema. A primary CNS neoplasm, most likely a high grade glioma, is also a possibility, in which case the abnormal T2WI hyperintensity could indicate edema or non-enhancing tumor. Vasculitis is less likely in the context of normal MRA, but remains a consideration (MRA is less sensitive for detecting findings of vasculitis than CTA or conventional angiography). 2. Normal MRA. 3. Unchanged 6 mm rightward midline shift. Electronically Signed   By: Ulyses Jarred M.D.   On: 01/01/2018 00:39    Review of Systems - Negative except As above    Blood pressure (!) 150/70, pulse 70, temperature 98.6 F (37 C), temperature source Oral, resp. rate 18, height _0  (1.676 m), weight 96.9 kg (213 lb 10 oz), SpO2 96 %. Physical Exam  Constitutional: She appears well-developed and well-nourished. She appears lethargic.  HENT:  Head: Normocephalic and atraumatic.  Neurological: She appears lethargic. A cranial nerve deficit is present. GCS eye subscore is 4. GCS verbal subscore is 4. GCS motor subscore is 6.  Patient has right facial droop, right hemiparesis, which fluctuated over ten minutes, initially hemiplegia, then strength returned almost to normal.  Patient has aphasia with some slurring of words and word finding difficulty, also easily confused about time.  Seems to have intact comprehension.     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  She also has a fluctuating neurologic exam suspicious for seizure activity.  I have increased decadron to 10 mg  Q 6 hours and have recommended close observation in Neuro ICU.  She has been started on Keppra.  She will get an EEG.  She will likely require craniotomy for biopsy of left temporal enhancing material.  I favor open biopsy, rather than stereotactic biopsy, given close proximity to middle cerebral arterial system.  I reviewed scans with family and answered their questions.  She will require preoperative MRI by [facility] protocol for preoperative surgical planning.  Peggyann Shoals, MD 01/01/2018, 1:57 PM

## 2018-01-01 NOTE — ED Notes (Signed)
Hospital bed ordered for patient.

## 2018-01-01 NOTE — ED Notes (Signed)
Dr. Maudie Mercury at bedside speaking with family and pt.

## 2018-01-01 NOTE — Progress Notes (Signed)
Paged PCCM Dr.Kim to notify of elevated BP.

## 2018-01-02 ENCOUNTER — Encounter (HOSPITAL_COMMUNITY): Payer: Self-pay | Admitting: *Deleted

## 2018-01-02 ENCOUNTER — Inpatient Hospital Stay (HOSPITAL_COMMUNITY): Payer: Medicare Other

## 2018-01-02 DIAGNOSIS — G9341 Metabolic encephalopathy: Secondary | ICD-10-CM

## 2018-01-02 DIAGNOSIS — R410 Disorientation, unspecified: Secondary | ICD-10-CM

## 2018-01-02 DIAGNOSIS — R930 Abnormal findings on diagnostic imaging of skull and head, not elsewhere classified: Secondary | ICD-10-CM

## 2018-01-02 LAB — CBC
HCT: 44.1 % (ref 36.0–46.0)
Hemoglobin: 15.4 g/dL — ABNORMAL HIGH (ref 12.0–15.0)
MCH: 31.9 pg (ref 26.0–34.0)
MCHC: 34.9 g/dL (ref 30.0–36.0)
MCV: 91.3 fL (ref 78.0–100.0)
Platelets: 421 10*3/uL — ABNORMAL HIGH (ref 150–400)
RBC: 4.83 MIL/uL (ref 3.87–5.11)
RDW: 12.9 % (ref 11.5–15.5)
WBC: 15.2 10*3/uL — ABNORMAL HIGH (ref 4.0–10.5)

## 2018-01-02 LAB — URINE CULTURE

## 2018-01-02 LAB — BASIC METABOLIC PANEL
Anion gap: 11 (ref 5–15)
BUN: 16 mg/dL (ref 6–20)
CO2: 20 mmol/L — ABNORMAL LOW (ref 22–32)
Calcium: 9.3 mg/dL (ref 8.9–10.3)
Chloride: 105 mmol/L (ref 101–111)
Creatinine, Ser: 0.52 mg/dL (ref 0.44–1.00)
GFR calc Af Amer: >60 mL/min (ref >60)
GFR calc non Af Amer: >60 mL/min (ref >60)
Glucose, Bld: 161 mg/dL — ABNORMAL HIGH (ref 65–99)
Potassium: 3.4 mmol/L — ABNORMAL LOW (ref 3.5–5.1)
Sodium: 136 mmol/L (ref 135–145)

## 2018-01-02 LAB — GLUCOSE, CAPILLARY
Glucose-Capillary: 155 mg/dL — ABNORMAL HIGH (ref 65–99)
Glucose-Capillary: 154 mg/dL — ABNORMAL HIGH (ref 65–99)
Glucose-Capillary: 143 mg/dL — ABNORMAL HIGH (ref 65–99)
Glucose-Capillary: 135 mg/dL — ABNORMAL HIGH (ref 65–99)

## 2018-01-02 LAB — MAGNESIUM: Magnesium: 1.9 mg/dL (ref 1.7–2.4)

## 2018-01-02 MED ORDER — PANTOPRAZOLE SODIUM 40 MG IV SOLR
40.0000 mg | INTRAVENOUS | Status: DC
  Administered 2018-01-02 – 2018-01-04 (×3): 40 mg via INTRAVENOUS
  Filled 2018-01-02 (×2): qty 40

## 2018-01-02 MED ORDER — DEXAMETHASONE SODIUM PHOSPHATE 10 MG/ML IJ SOLN
6.0000 mg | Freq: Four times a day (QID) | INTRAMUSCULAR | Status: DC
  Administered 2018-01-02 – 2018-01-04 (×9): 6 mg via INTRAVENOUS
  Filled 2018-01-02 (×8): qty 1

## 2018-01-02 MED ORDER — ORAL CARE MOUTH RINSE
15.0000 mL | Freq: Two times a day (BID) | OROMUCOSAL | Status: DC
  Administered 2018-01-02 – 2018-01-03 (×3): 15 mL via OROMUCOSAL

## 2018-01-02 MED ORDER — CHLORHEXIDINE GLUCONATE 0.12 % MT SOLN
15.0000 mL | Freq: Two times a day (BID) | OROMUCOSAL | Status: DC
  Administered 2018-01-02 (×2): 15 mL via OROMUCOSAL

## 2018-01-02 NOTE — Progress Notes (Signed)
PULMONARY / CRITICAL CARE MEDICINE   Name: Tara [name] MRN:   [id number] DOB:   04-07-1951           ADMISSION DATE:  12/31/2017 CONSULTATION DATE:  01/01/2018 REFERRING MD:  Tana Coast, Ripudeep  CHIEF COMPLAINT: Left brain mass and seizures  HISTORY OF PRESENT ILLNESS:        This is a 67 year old who reports no significant past medical history until the past 2 days when she was having difficulties with speech and intermittent difficulties using her right side.  She presented to the emergency room on 3/1 and a CT scan of the head was obtained which showed an ill-defined mass with lots of surrounding edema centered in the left MCA territory.  There was a 4 mm left to right shift.  She denies any known prior history of cancer adenopathy or weight loss.  CTs of the chest abdomen and pelvis have not suggested a primary and she is HIV negative.  This afternoon she had overt seizure activity and was loaded with Keppra and transferred to the intensive care unit.  PAST MEDICAL HISTORY :  She  has no past medical history on file.  PAST SURGICAL HISTORY: She  has no past surgical history on file.  No Known Allergies  No current facility-administered medications on file prior to encounter.        Current Outpatient Medications on File Prior to Encounter  Medication Sig  . aspirin-acetaminophen-caffeine (EXCEDRIN MIGRAINE) 250-250-65 MG tablet Take 1 tablet by mouth every 6 (six) hours as needed for headache or migraine.  . Multiple Vitamin (MULTIVITAMIN WITH MINERALS) TABS tablet Take 1 tablet by mouth daily.  Marland Kitchen OVER THE COUNTER MEDICATION Take by mouth See admin instructions. Over the counter diet pills (Keto etc)    FAMILY HISTORY:  Her indicated that her mother is deceased. She indicated that her father is deceased.   SOCIAL HISTORY: She  reports that she has been smoking cigarettes.  She has been smoking about 1.00 pack per day. she has never used smokeless tobacco. She  reports that she does not drink alcohol or use drugs.  REVIEW OF SYSTEMS:   Activity is somewhat limited by a pain in her right hip.  She has no prior known history of seizure or CVA.  She has no known history of respiratory illness no unusual dyspnea, no known heart disease no known chest pain MIs arrhythmias or syncopal episodes in the past.  She denies a history of GI disease no internal bleeding.  She denies diabetes or thyroid disease.  SUBJECTIVE:  As above  VITAL SIGNS: BP (!) 150/70 (BP Location: Right Arm)   Pulse 70   Temp 98.6 F (37 C) (Oral)   Resp 18   Ht 5\' 6"  (1.676 m)   Wt 213 lb 10 oz (96.9 kg)   SpO2 96%   BMI 34.48 kg/m   HEMODYNAMICS:    INTAKE / OUTPUT: I/O last 3 completed shifts: In: 191.3 [I.V.:191.3] Out: -   PHYSICAL EXAMINATION: General: Middle-aged somewhat obese female in no overt distress Neuro: She is entirely alert and oriented x3.  Pupils are equal and EOMs appear to be full.  She has an overt right facial droop.  Grip is 5/5 bilaterally and she is able to lift both legs off the bed Cardiovascular: S1 and S2 are regular with frequent ectopic.  There is no murmur rub or gallop. Lungs: Abrasions are unlabored, there is symmetric air movement no wheezes Abdomen: The abdomen  is obese and soft without any organomegaly masses tenderness guarding or rebound, she is anicteric Musculoskeletal: No dependent edema, the limbs are warm   LABS:  BMET LastLabs       Recent Labs  Lab 12/31/17 1638 12/31/17 2204 01/01/18 0209  NA 136 135 133*  K 3.6 4.5 3.8  CL 101 103 101  CO2 24  --  21*  BUN 17 24* 15  CREATININE 0.74 0.60 0.62  GLUCOSE 115* 110* 147*      Electrolytes LastLabs      Recent Labs  Lab 12/31/17 1638 01/01/18 0209  CALCIUM 9.2 9.3      CBC LastLabs       Recent Labs  Lab 12/31/17 1638 12/31/17 2204 01/01/18 0209  WBC 9.4  --  11.3*  HGB 15.3* 13.9 14.0  HCT 45.3 41.0 41.0  PLT 424*   --  378      Coag's LastLabs  Recent Labs  Lab 12/31/17 2156  APTT 40*  INR 1.19      Sepsis Markers LastLabs  No results for input(s): LATICACIDVEN, PROCALCITON, O2SATVEN in the last 168 hours.    ABG LastLabs  No results for input(s): PHART, PCO2ART, PO2ART in the last 168 hours.    Liver Enzymes LastLabs      Recent Labs  Lab 12/31/17 1638 01/01/18 0209  AST 15 15  ALT 14 17  ALKPHOS 118 113  BILITOT 0.7 0.6  ALBUMIN 4.1 3.7      Cardiac Enzymes LastLabs  No results for input(s): TROPONINI, PROBNP in the last 168 hours.    Glucose LastLabs     Recent Labs  Lab 12/31/17 1643  GLUCAP 101*      Imaging Ct Head Wo Contrast  Result Date: 01/01/2018 CLINICAL DATA:  Slurred speech and right-sided weakness. EXAM: CT HEAD WITHOUT CONTRAST TECHNIQUE: Contiguous axial images were obtained from the base of the skull through the vertex without intravenous contrast. COMPARISON:  Head CT and brain MRI dated 12/31/2017 FINDINGS: Brain: The masslike area of heterogeneous attenuation, primarily hypoattenuation, surrounding the central left middle cerebral artery and extending from the left temporal lobe through the base a ganglia to the left centrum semiovale, is without change from the previous day's exams. There is still mild midline shift to the right currently measuring 4 mm. There are no new areas of abnormal parenchymal attenuation and no new areas of mass effect. No intracranial hemorrhage. Vascular: No hyperdense vessel or unexpected calcification. Skull: Normal. Negative for fracture or focal lesion. Sinuses/Orbits: Globes and orbits are unremarkable. Sinuses and mastoid air cells are clear. Other: None. IMPRESSION: 1. No significant change from the previous day's exams. 2. Masslike area of abnormal attenuation, primarily hypoattenuation, with associated mass effect, on the left, centered on the left basal ganglia and surrounding the left  middle cerebral artery, is unchanged in extent and degree of mass effect. 3. No new abnormalities.  No intracranial hemorrhage. Electronically Signed   By: Lajean Manes M.D.   On: 01/01/2018 11:35   Ct Head Wo Contrast  Result Date: 12/31/2017 CLINICAL DATA:  Altered level of consciousness. EXAM: CT HEAD WITHOUT CONTRAST TECHNIQUE: Contiguous axial images were obtained from the base of the skull through the vertex without intravenous contrast. COMPARISON:  None. FINDINGS: Brain: Large amount of white matter edema is seen in the left basal ganglia and temporal lobe. This is concerning for underlying neoplasm. This results in 9 mm of left-to-right midline shift. No ventricular dilatation is noted. No  definite hemorrhage is noted. Vascular: No hyperdense vessel or unexpected calcification. Skull: Normal. Negative for fracture or focal lesion. Sinuses/Orbits: No acute finding. Other: None. IMPRESSION: Large amount of white matter edema is seen in the left basal ganglia and left temporal lobe concerning for underlying neoplasm. This results in 9 mm of left-to-right midline shift. Further evaluation with MRI with and without gadolinium administration is recommended. Electronically Signed   By: Marijo Conception, M.D.   On: 12/31/2017 21:32   Ct Chest W Contrast  Result Date: 01/01/2018 CLINICAL DATA:  Findings worrisome for CNS lymphoma versus glioma brain MRI. For staging. EXAM: CT CHEST, ABDOMEN, AND PELVIS WITH CONTRAST TECHNIQUE: Multidetector CT imaging of the chest, abdomen and pelvis was performed following the standard protocol during bolus administration of intravenous contrast. CONTRAST:  176mL ISOVUE-300 IOPAMIDOL (ISOVUE-300) INJECTION 61% COMPARISON:  None. FINDINGS: CT CHEST FINDINGS Cardiovascular: Heart is normal in size.  No pericardial effusion. No evidence of thoracic aortic aneurysm. Mediastinum/Nodes: No suspicious mediastinal, hilar, or axillary lymphadenopathy. Visualized thyroid is  unremarkable. Lungs/Pleura: Mild dependent atelectasis in the bilateral lower lobes and posterior left upper lobe. No suspicious pulmonary nodules. No focal consolidation. No pleural effusion or pneumothorax. Musculoskeletal: Visualized osseous structures are within normal limits. CT ABDOMEN PELVIS FINDINGS Hepatobiliary: 13 mm cyst in segment 5 of the liver (series 3/image 56). Layering gallstone (series 3/image 34). No associated inflammatory changes. No intrahepatic or extrahepatic ductal dilatation. Pancreas: Within normal limits. Spleen: Spleen is normal in size. Adrenals/Urinary Tract: Adrenal glands are within normal limits. 3 mm nonobstructing left lower pole renal calculus (series 3/image 70). Multiple left renal cysts, measuring up to 5.1 cm in the posterior left lower kidney (series 3/image 69), measuring simple fluid density (Bosniak I). Right kidney is within normal limits. No hydronephrosis. Bladder is within normal limits. Stomach/Bowel: Stomach is notable for a tiny hiatal hernia. No evidence of bowel obstruction. Normal appendix (series 3/image 92). Mild sigmoid diverticulosis, without evidence of diverticulitis. Vascular/Lymphatic: No evidence of abdominal aortic aneurysm. Atherosclerotic calcifications of the abdominal aorta and branch vessels. No suspicious abdominopelvic lymphadenopathy. Reproductive: Uterus is within normal limits. Bilateral ovaries are within normal limits. Other: No abdominopelvic ascites. Musculoskeletal: Mild degenerative changes of the lumbar spine. IMPRESSION: No findings suspicious for malignancy (primary or metastatic) in the chest, abdomen, or pelvis. No suspicious lymphadenopathy.  Spleen is normal in size. 3 mm nonobstructing left lower pole renal calculus. Multiple left renal cysts, benign (Bosniak I). Cholelithiasis, without associated inflammatory changes. Electronically Signed   By: Julian Hy M.D.   On: 01/01/2018 09:12   Mr Brain W And Wo  Contrast  Result Date: 01/01/2018 CLINICAL DATA:  Confusion and mass demonstrated on earlier head CT EXAM: MRI HEAD WITHOUT AND WITH CONTRAST MRA HEAD WITHOUT CONTRAST TECHNIQUE: Multiplanar, multiecho pulse sequences of the brain and surrounding structures were obtained without and with intravenous contrast. Angiographic images of the head were obtained using MRA technique without contrast. CONTRAST:  65mL MULTIHANCE GADOBENATE DIMEGLUMINE 529 MG/ML IV SOLN COMPARISON:  Head CT 12/31/2017 FINDINGS: MRI HEAD FINDINGS Brain: The midline structures are normal. No focal diffusion restriction to indicate acute infarct. No intraparenchymal hemorrhage. Large amount of hyperintense T2-weighted signal in the left basal ganglia, left insula and left temporal lobe. There is an area of contrast enhancement within the brain parenchyma following the left middle cerebral artery M1 and proximal M2 segments. The largest confluent area of enhancement measures 2.2 x 1.3 cm. Rightward midline shift of 6 mm is unchanged. No  chronic microhemorrhage or cerebral amyloid angiopathy. No hydrocephalus, age advanced atrophy or lobar predominant volume loss. No dural abnormality or extra-axial collection. Skull and upper cervical spine: The visualized skull base, calvarium, upper cervical spine and extracranial soft tissues are normal. Sinuses/Orbits: No fluid levels or advanced mucosal thickening. No mastoid effusion. Normal orbits. MRA HEAD FINDINGS Intracranial internal carotid arteries: Normal. Anterior cerebral arteries: Normal. Middle cerebral arteries: Normal. Posterior communicating arteries: Present on the right. Posterior cerebral arteries: Normal. Fetal origin on the right. Basilar artery: Normal. Vertebral arteries: Left dominant. Normal. Superior cerebellar arteries: Normal. Anterior inferior cerebellar arteries: Normal. Posterior inferior cerebellar arteries: Normal. IMPRESSION: 1. Masslike parenchymal contrast enhancement  adjacent to the left MCA M1 and proximal M2 segments with large area of surrounding hyperintense T2-weighted signal. The appearance is concerning for lymphoma -- including the intravascular variant -- with a large amount of surrounding edema. A primary CNS neoplasm, most likely a high grade glioma, is also a possibility, in which case the abnormal T2WI hyperintensity could indicate edema or non-enhancing tumor. Vasculitis is less likely in the context of normal MRA, but remains a consideration (MRA is less sensitive for detecting findings of vasculitis than CTA or conventional angiography). 2. Normal MRA. 3. Unchanged 6 mm rightward midline shift. Electronically Signed   By: Ulyses Jarred M.D.   On: 01/01/2018 00:39   Ct Abdomen Pelvis W Contrast  Result Date: 01/01/2018 CLINICAL DATA:  Findings worrisome for CNS lymphoma versus glioma brain MRI. For staging. EXAM: CT CHEST, ABDOMEN, AND PELVIS WITH CONTRAST TECHNIQUE: Multidetector CT imaging of the chest, abdomen and pelvis was performed following the standard protocol during bolus administration of intravenous contrast. CONTRAST:  159mL ISOVUE-300 IOPAMIDOL (ISOVUE-300) INJECTION 61% COMPARISON:  None. FINDINGS: CT CHEST FINDINGS Cardiovascular: Heart is normal in size.  No pericardial effusion. No evidence of thoracic aortic aneurysm. Mediastinum/Nodes: No suspicious mediastinal, hilar, or axillary lymphadenopathy. Visualized thyroid is unremarkable. Lungs/Pleura: Mild dependent atelectasis in the bilateral lower lobes and posterior left upper lobe. No suspicious pulmonary nodules. No focal consolidation. No pleural effusion or pneumothorax. Musculoskeletal: Visualized osseous structures are within normal limits. CT ABDOMEN PELVIS FINDINGS Hepatobiliary: 13 mm cyst in segment 5 of the liver (series 3/image 56). Layering gallstone (series 3/image 34). No associated inflammatory changes. No intrahepatic or extrahepatic ductal dilatation. Pancreas: Within  normal limits. Spleen: Spleen is normal in size. Adrenals/Urinary Tract: Adrenal glands are within normal limits. 3 mm nonobstructing left lower pole renal calculus (series 3/image 70). Multiple left renal cysts, measuring up to 5.1 cm in the posterior left lower kidney (series 3/image 69), measuring simple fluid density (Bosniak I). Right kidney is within normal limits. No hydronephrosis. Bladder is within normal limits. Stomach/Bowel: Stomach is notable for a tiny hiatal hernia. No evidence of bowel obstruction. Normal appendix (series 3/image 92). Mild sigmoid diverticulosis, without evidence of diverticulitis. Vascular/Lymphatic: No evidence of abdominal aortic aneurysm. Atherosclerotic calcifications of the abdominal aorta and branch vessels. No suspicious abdominopelvic lymphadenopathy. Reproductive: Uterus is within normal limits. Bilateral ovaries are within normal limits. Other: No abdominopelvic ascites. Musculoskeletal: Mild degenerative changes of the lumbar spine. IMPRESSION: No findings suspicious for malignancy (primary or metastatic) in the chest, abdomen, or pelvis. No suspicious lymphadenopathy.  Spleen is normal in size. 3 mm nonobstructing left lower pole renal calculus. Multiple left renal cysts, benign (Bosniak I). Cholelithiasis, without associated inflammatory changes. Electronically Signed   By: Julian Hy M.D.   On: 01/01/2018 09:12   Mr Jodene Nam Head (cerebral Arteries)  Result Date:  01/01/2018 CLINICAL DATA:  Confusion and mass demonstrated on earlier head CT EXAM: MRI HEAD WITHOUT AND WITH CONTRAST MRA HEAD WITHOUT CONTRAST TECHNIQUE: Multiplanar, multiecho pulse sequences of the brain and surrounding structures were obtained without and with intravenous contrast. Angiographic images of the head were obtained using MRA technique without contrast. CONTRAST:  72mL MULTIHANCE GADOBENATE DIMEGLUMINE 529 MG/ML IV SOLN COMPARISON:  Head CT 12/31/2017 FINDINGS: MRI HEAD FINDINGS Brain:  The midline structures are normal. No focal diffusion restriction to indicate acute infarct. No intraparenchymal hemorrhage. Large amount of hyperintense T2-weighted signal in the left basal ganglia, left insula and left temporal lobe. There is an area of contrast enhancement within the brain parenchyma following the left middle cerebral artery M1 and proximal M2 segments. The largest confluent area of enhancement measures 2.2 x 1.3 cm. Rightward midline shift of 6 mm is unchanged. No chronic microhemorrhage or cerebral amyloid angiopathy. No hydrocephalus, age advanced atrophy or lobar predominant volume loss. No dural abnormality or extra-axial collection. Skull and upper cervical spine: The visualized skull base, calvarium, upper cervical spine and extracranial soft tissues are normal. Sinuses/Orbits: No fluid levels or advanced mucosal thickening. No mastoid effusion. Normal orbits. MRA HEAD FINDINGS Intracranial internal carotid arteries: Normal. Anterior cerebral arteries: Normal. Middle cerebral arteries: Normal. Posterior communicating arteries: Present on the right. Posterior cerebral arteries: Normal. Fetal origin on the right. Basilar artery: Normal. Vertebral arteries: Left dominant. Normal. Superior cerebellar arteries: Normal. Anterior inferior cerebellar arteries: Normal. Posterior inferior cerebellar arteries: Normal. IMPRESSION: 1. Masslike parenchymal contrast enhancement adjacent to the left MCA M1 and proximal M2 segments with large area of surrounding hyperintense T2-weighted signal. The appearance is concerning for lymphoma -- including the intravascular variant -- with a large amount of surrounding edema. A primary CNS neoplasm, most likely a high grade glioma, is also a possibility, in which case the abnormal T2WI hyperintensity could indicate edema or non-enhancing tumor. Vasculitis is less likely in the context of normal MRA, but remains a consideration (MRA is less sensitive for detecting  findings of vasculitis than CTA or conventional angiography). 2. Normal MRA. 3. Unchanged 6 mm rightward midline shift. Electronically Signed   By: Ulyses Jarred M.D.   On: 01/01/2018 00:39     DISCUSSION: This is a 67 year old who presented with altered mental status speech difficulties and confusion.  She was found to have a large ill-defined mass with substantial edema and subsequently has had seizure activity.  She is transferred to the ICU to ensure that her seizures are controlled in anticipation of a stereotactic biopsy.  She was taking no medications prior to admission appears to have no concurrent significant illness.  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  She continues on Decadron for now she has been loaded for Keppra for intermittent seizures.  I am anticipating a stereotactic biopsy.  I have ordered intermittent glucose monitoring while on high-dose Decadron.    For hypertension we will continue with Norvasc. She is on DVT prophylaxis. For GI prophylaxis she is on PPI IV daily.

## 2018-01-02 NOTE — Progress Notes (Signed)
[facility]                                                                              Patient Demographics  Tara [name], is a 67 y.o. female, DOB - Nov 03, 1950, DGU:[id number]  Admit date - 12/31/2017   Admitting Physician Jani Gravel, MD  Outpatient Primary MD for the patient is Patient, No Pcp Per  Outpatient specialists:   LOS - 2  days   Medical records reviewed and are as summarized below:    Chief Complaint  Patient presents with  . Altered Mental Status       Brief summary   Patient is a 67 year old female with history of hypertension, presented to ED with confusion, difficulty finishing sentences since the night before the admission. She also complained of slight left frontal headache. Denied any fevers, chills or reported. CT showed large amount of white matter edema in the left basal ganglia, left temporal lobe concerning for underlying neoplasm, 9 mm of left-to-right midline shift. Patient was admitted for further workup.    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 Mickeal Skinner - Highly appreciate neurosurgery following, Dr. Vertell Limber, plan for brain mass biopsy - EEG pending, continue IV Keppra, IV Decadron  Active Problems:   Headache, acute metabolic encephalopathy/right-sided hemiparesis: -Right-sided weakness improving - Likely due to #1, continue IV Decadron    Hypertension - Continue hydralazine as needed  with parameters - Continue amlodipine 5 mg daily    Hyperglycemia - Likely due to steroids, hemoglobin A1c 5.9 - Continue sliding scale insulin  Code Status: Full code DVT Prophylaxis:  Lovenox  Family Communication: Discussed in detail with the patient, all imaging results, lab results explained to the patient and son at the bedside  Disposition Plan: Pending workup  Time Spent in minutes  25 minutes  Procedures:  MRI brain   Consultants:   Neuro surgery  Neurology CCM Neuro-oncology : Dr Mickeal Skinner   Antimicrobials:      Medications  Scheduled Meds: . amLODipine  5 mg Oral Daily  . chlorhexidine  15 mL Mouth Rinse BID  . dexamethasone  6 mg Intravenous Q6H  . enoxaparin (LOVENOX) injection  40 mg Subcutaneous Q24H  . insulin aspart  0-9 Units Subcutaneous TID WC  . mouth rinse  15 mL Mouth Rinse q12n4p  . pantoprazole (PROTONIX) IV  40 mg Intravenous Q24H   Continuous Infusions: . 0.9 % NaCl with KCl 20 mEq / L 60 mL/hr (01/02/18 0813)  . levETIRAcetam Stopped (01/02/18 0027)   PRN Meds:.hydrALAZINE, ondansetron (ZOFRAN) IV   Antibiotics   Anti-infectives (From admission, onward)   None  Subjective:   Tara [name] was seen and examined today. Right-sided weakness improving, much more alert and oriented, son at the bedside. Right-sided weakness improving. Patient denies dizziness, chest pain, shortness of breath, abdominal pain, N/V/D/C. No acute events overnight.  No fevers or chills  Objective:   Vitals:   01/02/18 0600 01/02/18 0700 01/02/18 0800 01/02/18 0900  BP: (!) 138/96 (!) 149/88 (!) 156/86 129/69  Pulse: 93 80 80 74  Resp: 13 15 14 18   Temp:   97.6 F (36.4 C)   TempSrc:   Oral   SpO2: 95% 95% 100% 96%  Weight:      Height:        Intake/Output Summary (Last 24 hours) at 01/02/2018 1055 Last data filed at 01/02/2018 0900 Gross per 24 hour  Intake 1123 ml  Output -  Net 1123 ml     Wt Readings from Last 3 Encounters:    01/02/18 99 kg (218 lb 4.1 oz)     Exam   General: Much more oriented today  Eyes:   HEENT:    Cardiovascular: S1 and S2 clear no MRG, RRR, no pedal edema  Respiratory: CTA B  Gastrointestinal: Soft, nontender, nondistended, + bowel sounds  Ext: no pedal edema bilaterally  Neuro: right-sided weakness improving, 4/5, Lside 5/5  Musculoskeletal: No digital cyanosis, clubbing  Skin: No rashes  Psych: Normal affect and demeanor, alert and oriented x3    Data Reviewed:  I have personally reviewed following labs and imaging studies  Micro Results No results found for this or any previous visit (from the past 240 hour(s)).  Radiology Reports Ct Head Wo Contrast  Result Date: 01/01/2018 CLINICAL DATA:  Slurred speech and right-sided weakness. EXAM: CT HEAD WITHOUT CONTRAST TECHNIQUE: Contiguous axial images were obtained from the base of the skull through the vertex without intravenous contrast. COMPARISON:  Head CT and brain MRI dated 12/31/2017 FINDINGS: Brain: The masslike area of heterogeneous attenuation, primarily hypoattenuation, surrounding the central left middle cerebral artery and extending from the left temporal lobe through the base a ganglia to the left centrum semiovale, is without change from the previous day's exams. There is still mild midline shift to the right currently measuring 4 mm. There are no new areas of abnormal parenchymal attenuation and no new areas of mass effect. No intracranial hemorrhage. Vascular: No hyperdense vessel or unexpected calcification. Skull: Normal. Negative for fracture or focal lesion. Sinuses/Orbits: Globes and orbits are unremarkable. Sinuses and mastoid air cells are clear. Other: None. IMPRESSION: 1. No significant change from the previous day's exams. 2. Masslike area of abnormal attenuation, primarily hypoattenuation, with associated mass effect, on the left, centered on the left basal ganglia and surrounding the left middle  cerebral artery, is unchanged in extent and degree of mass effect. 3. No new abnormalities.  No intracranial hemorrhage. Electronically Signed   By: Lajean Manes M.D.   On: 01/01/2018 11:35   Ct Head Wo Contrast  Result Date: 12/31/2017 CLINICAL DATA:  Altered level of consciousness. EXAM: CT HEAD WITHOUT CONTRAST TECHNIQUE: Contiguous axial images were obtained from the base of the skull through the vertex without intravenous contrast. COMPARISON:  None. FINDINGS: Brain: Large amount of white matter edema is seen in the left basal ganglia and temporal lobe. This is concerning for underlying neoplasm. This results in 9 mm of left-to-right midline shift. No ventricular dilatation is noted. No definite hemorrhage is noted. Vascular: No hyperdense vessel or unexpected calcification. Skull: Normal. Negative for fracture or focal  lesion. Sinuses/Orbits: No acute finding. Other: None. IMPRESSION: Large amount of white matter edema is seen in the left basal ganglia and left temporal lobe concerning for underlying neoplasm. This results in 9 mm of left-to-right midline shift. Further evaluation with MRI with and without gadolinium administration is recommended. Electronically Signed   By: Marijo Conception, M.D.   On: 12/31/2017 21:32   Ct Chest W Contrast  Result Date: 01/01/2018 CLINICAL DATA:  Findings worrisome for CNS lymphoma versus glioma brain MRI. For staging. EXAM: CT CHEST, ABDOMEN, AND PELVIS WITH CONTRAST TECHNIQUE: Multidetector CT imaging of the chest, abdomen and pelvis was performed following the standard protocol during bolus administration of intravenous contrast. CONTRAST:  171mL ISOVUE-300 IOPAMIDOL (ISOVUE-300) INJECTION 61% COMPARISON:  None. FINDINGS: CT CHEST FINDINGS Cardiovascular: Heart is normal in size.  No pericardial effusion. No evidence of thoracic aortic aneurysm. Mediastinum/Nodes: No suspicious mediastinal, hilar, or axillary lymphadenopathy. Visualized thyroid is unremarkable.  Lungs/Pleura: Mild dependent atelectasis in the bilateral lower lobes and posterior left upper lobe. No suspicious pulmonary nodules. No focal consolidation. No pleural effusion or pneumothorax. Musculoskeletal: Visualized osseous structures are within normal limits. CT ABDOMEN PELVIS FINDINGS Hepatobiliary: 13 mm cyst in segment 5 of the liver (series 3/image 56). Layering gallstone (series 3/image 34). No associated inflammatory changes. No intrahepatic or extrahepatic ductal dilatation. Pancreas: Within normal limits. Spleen: Spleen is normal in size. Adrenals/Urinary Tract: Adrenal glands are within normal limits. 3 mm nonobstructing left lower pole renal calculus (series 3/image 70). Multiple left renal cysts, measuring up to 5.1 cm in the posterior left lower kidney (series 3/image 69), measuring simple fluid density (Bosniak I). Right kidney is within normal limits. No hydronephrosis. Bladder is within normal limits. Stomach/Bowel: Stomach is notable for a tiny hiatal hernia. No evidence of bowel obstruction. Normal appendix (series 3/image 92). Mild sigmoid diverticulosis, without evidence of diverticulitis. Vascular/Lymphatic: No evidence of abdominal aortic aneurysm. Atherosclerotic calcifications of the abdominal aorta and branch vessels. No suspicious abdominopelvic lymphadenopathy. Reproductive: Uterus is within normal limits. Bilateral ovaries are within normal limits. Other: No abdominopelvic ascites. Musculoskeletal: Mild degenerative changes of the lumbar spine. IMPRESSION: No findings suspicious for malignancy (primary or metastatic) in the chest, abdomen, or pelvis. No suspicious lymphadenopathy.  Spleen is normal in size. 3 mm nonobstructing left lower pole renal calculus. Multiple left renal cysts, benign (Bosniak I). Cholelithiasis, without associated inflammatory changes. Electronically Signed   By: Julian Hy M.D.   On: 01/01/2018 09:12   Mr Brain W And Wo Contrast  Result Date:  01/01/2018 CLINICAL DATA:  Confusion and mass demonstrated on earlier head CT EXAM: MRI HEAD WITHOUT AND WITH CONTRAST MRA HEAD WITHOUT CONTRAST TECHNIQUE: Multiplanar, multiecho pulse sequences of the brain and surrounding structures were obtained without and with intravenous contrast. Angiographic images of the head were obtained using MRA technique without contrast. CONTRAST:  35mL MULTIHANCE GADOBENATE DIMEGLUMINE 529 MG/ML IV SOLN COMPARISON:  Head CT 12/31/2017 FINDINGS: MRI HEAD FINDINGS Brain: The midline structures are normal. No focal diffusion restriction to indicate acute infarct. No intraparenchymal hemorrhage. Large amount of hyperintense T2-weighted signal in the left basal ganglia, left insula and left temporal lobe. There is an area of contrast enhancement within the brain parenchyma following the left middle cerebral artery M1 and proximal M2 segments. The largest confluent area of enhancement measures 2.2 x 1.3 cm. Rightward midline shift of 6 mm is unchanged. No chronic microhemorrhage or cerebral amyloid angiopathy. No hydrocephalus, age advanced atrophy or lobar predominant volume loss. No dural  abnormality or extra-axial collection. Skull and upper cervical spine: The visualized skull base, calvarium, upper cervical spine and extracranial soft tissues are normal. Sinuses/Orbits: No fluid levels or advanced mucosal thickening. No mastoid effusion. Normal orbits. MRA HEAD FINDINGS Intracranial internal carotid arteries: Normal. Anterior cerebral arteries: Normal. Middle cerebral arteries: Normal. Posterior communicating arteries: Present on the right. Posterior cerebral arteries: Normal. Fetal origin on the right. Basilar artery: Normal. Vertebral arteries: Left dominant. Normal. Superior cerebellar arteries: Normal. Anterior inferior cerebellar arteries: Normal. Posterior inferior cerebellar arteries: Normal. IMPRESSION: 1. Masslike parenchymal contrast enhancement adjacent to the left MCA M1  and proximal M2 segments with large area of surrounding hyperintense T2-weighted signal. The appearance is concerning for lymphoma -- including the intravascular variant -- with a large amount of surrounding edema. A primary CNS neoplasm, most likely a high grade glioma, is also a possibility, in which case the abnormal T2WI hyperintensity could indicate edema or non-enhancing tumor. Vasculitis is less likely in the context of normal MRA, but remains a consideration (MRA is less sensitive for detecting findings of vasculitis than CTA or conventional angiography). 2. Normal MRA. 3. Unchanged 6 mm rightward midline shift. Electronically Signed   By: Ulyses Jarred M.D.   On: 01/01/2018 00:39   Ct Abdomen Pelvis W Contrast  Result Date: 01/01/2018 CLINICAL DATA:  Findings worrisome for CNS lymphoma versus glioma brain MRI. For staging. EXAM: CT CHEST, ABDOMEN, AND PELVIS WITH CONTRAST TECHNIQUE: Multidetector CT imaging of the chest, abdomen and pelvis was performed following the standard protocol during bolus administration of intravenous contrast. CONTRAST:  127mL ISOVUE-300 IOPAMIDOL (ISOVUE-300) INJECTION 61% COMPARISON:  None. FINDINGS: CT CHEST FINDINGS Cardiovascular: Heart is normal in size.  No pericardial effusion. No evidence of thoracic aortic aneurysm. Mediastinum/Nodes: No suspicious mediastinal, hilar, or axillary lymphadenopathy. Visualized thyroid is unremarkable. Lungs/Pleura: Mild dependent atelectasis in the bilateral lower lobes and posterior left upper lobe. No suspicious pulmonary nodules. No focal consolidation. No pleural effusion or pneumothorax. Musculoskeletal: Visualized osseous structures are within normal limits. CT ABDOMEN PELVIS FINDINGS Hepatobiliary: 13 mm cyst in segment 5 of the liver (series 3/image 56). Layering gallstone (series 3/image 34). No associated inflammatory changes. No intrahepatic or extrahepatic ductal dilatation. Pancreas: Within normal limits. Spleen: Spleen is  normal in size. Adrenals/Urinary Tract: Adrenal glands are within normal limits. 3 mm nonobstructing left lower pole renal calculus (series 3/image 70). Multiple left renal cysts, measuring up to 5.1 cm in the posterior left lower kidney (series 3/image 69), measuring simple fluid density (Bosniak I). Right kidney is within normal limits. No hydronephrosis. Bladder is within normal limits. Stomach/Bowel: Stomach is notable for a tiny hiatal hernia. No evidence of bowel obstruction. Normal appendix (series 3/image 92). Mild sigmoid diverticulosis, without evidence of diverticulitis. Vascular/Lymphatic: No evidence of abdominal aortic aneurysm. Atherosclerotic calcifications of the abdominal aorta and branch vessels. No suspicious abdominopelvic lymphadenopathy. Reproductive: Uterus is within normal limits. Bilateral ovaries are within normal limits. Other: No abdominopelvic ascites. Musculoskeletal: Mild degenerative changes of the lumbar spine. IMPRESSION: No findings suspicious for malignancy (primary or metastatic) in the chest, abdomen, or pelvis. No suspicious lymphadenopathy.  Spleen is normal in size. 3 mm nonobstructing left lower pole renal calculus. Multiple left renal cysts, benign (Bosniak I). Cholelithiasis, without associated inflammatory changes. Electronically Signed   By: Julian Hy M.D.   On: 01/01/2018 09:12   Mr Jodene Nam Head (cerebral Arteries)  Result Date: 01/01/2018 CLINICAL DATA:  Confusion and mass demonstrated on earlier head CT EXAM: MRI HEAD WITHOUT AND WITH  CONTRAST MRA HEAD WITHOUT CONTRAST TECHNIQUE: Multiplanar, multiecho pulse sequences of the brain and surrounding structures were obtained without and with intravenous contrast. Angiographic images of the head were obtained using MRA technique without contrast. CONTRAST:  51mL MULTIHANCE GADOBENATE DIMEGLUMINE 529 MG/ML IV SOLN COMPARISON:  Head CT 12/31/2017 FINDINGS: MRI HEAD FINDINGS Brain: The midline structures are normal.  No focal diffusion restriction to indicate acute infarct. No intraparenchymal hemorrhage. Large amount of hyperintense T2-weighted signal in the left basal ganglia, left insula and left temporal lobe. There is an area of contrast enhancement within the brain parenchyma following the left middle cerebral artery M1 and proximal M2 segments. The largest confluent area of enhancement measures 2.2 x 1.3 cm. Rightward midline shift of 6 mm is unchanged. No chronic microhemorrhage or cerebral amyloid angiopathy. No hydrocephalus, age advanced atrophy or lobar predominant volume loss. No dural abnormality or extra-axial collection. Skull and upper cervical spine: The visualized skull base, calvarium, upper cervical spine and extracranial soft tissues are normal. Sinuses/Orbits: No fluid levels or advanced mucosal thickening. No mastoid effusion. Normal orbits. MRA HEAD FINDINGS Intracranial internal carotid arteries: Normal. Anterior cerebral arteries: Normal. Middle cerebral arteries: Normal. Posterior communicating arteries: Present on the right. Posterior cerebral arteries: Normal. Fetal origin on the right. Basilar artery: Normal. Vertebral arteries: Left dominant. Normal. Superior cerebellar arteries: Normal. Anterior inferior cerebellar arteries: Normal. Posterior inferior cerebellar arteries: Normal. IMPRESSION: 1. Masslike parenchymal contrast enhancement adjacent to the left MCA M1 and proximal M2 segments with large area of surrounding hyperintense T2-weighted signal. The appearance is concerning for lymphoma -- including the intravascular variant -- with a large amount of surrounding edema. A primary CNS neoplasm, most likely a high grade glioma, is also a possibility, in which case the abnormal T2WI hyperintensity could indicate edema or non-enhancing tumor. Vasculitis is less likely in the context of normal MRA, but remains a consideration (MRA is less sensitive for detecting findings of vasculitis than CTA or  conventional angiography). 2. Normal MRA. 3. Unchanged 6 mm rightward midline shift. Electronically Signed   By: Ulyses Jarred M.D.   On: 01/01/2018 00:39    Lab Data:  CBC: Recent Labs  Lab 12/31/17 1638 12/31/17 2204 01/01/18 0209 01/02/18 0500  WBC 9.4  --  11.3* 15.2*  NEUTROABS 7.9*  --   --   --   HGB 15.3* 13.9 14.0 15.4*  HCT 45.3 41.0 41.0 44.1  MCV 93.4  --  92.3 91.3  PLT 424*  --  378 952*   Basic Metabolic Panel: Recent Labs  Lab 12/31/17 1638 12/31/17 2204 01/01/18 0209 01/02/18 0500  NA 136 135 133* 136  K 3.6 4.5 3.8 3.4*  CL 101 103 101 105  CO2 24  --  21* 20*  GLUCOSE 115* 110* 147* 161*  BUN 17 24* 15 16  CREATININE 0.74 0.60 0.62 0.52  CALCIUM 9.2  --  9.3 9.3  MG  --   --   --  1.9   GFR: Estimated Creatinine Clearance: 82.1 mL/min (by C-G formula based on SCr of 0.52 mg/dL). Liver Function Tests: Recent Labs  Lab 12/31/17 1638 01/01/18 0209  AST 15 15  ALT 14 17  ALKPHOS 118 113  BILITOT 0.7 0.6  PROT 7.2 6.5  ALBUMIN 4.1 3.7   No results for input(s): LIPASE, AMYLASE in the last 168 hours. No results for input(s): AMMONIA in the last 168 hours. Coagulation Profile: Recent Labs  Lab 12/31/17 2156  INR 1.19   Cardiac Enzymes:  No results for input(s): CKTOTAL, CKMB, CKMBINDEX, TROPONINI in the last 168 hours. BNP (last 3 results) No results for input(s): PROBNP in the last 8760 hours. HbA1C: Recent Labs    01/01/18 1617  HGBA1C 5.9*   CBG: Recent Labs  Lab 12/31/17 1643 01/01/18 1656 01/01/18 1933 01/02/18 0751  GLUCAP 101* 127* 127* 155*   Lipid Profile: No results for input(s): CHOL, HDL, LDLCALC, TRIG, CHOLHDL, LDLDIRECT in the last 72 hours. Thyroid Function Tests: No results for input(s): TSH, T4TOTAL, FREET4, T3FREE, THYROIDAB in the last 72 hours. Anemia Panel: Recent Labs    01/01/18 0209  VITAMINB12 274   Urine analysis:    Component Value Date/Time   COLORURINE YELLOW 01/01/2018 0152    APPEARANCEUR HAZY (A) 01/01/2018 0152   LABSPEC 1.024 01/01/2018 0152   PHURINE 5.0 01/01/2018 0152   GLUCOSEU NEGATIVE 01/01/2018 0152   HGBUR LARGE (A) 01/01/2018 0152   BILIRUBINUR NEGATIVE 01/01/2018 [place] 01/01/2018 0152   PROTEINUR NEGATIVE 01/01/2018 0152   NITRITE NEGATIVE 01/01/2018 0152   LEUKOCYTESUR MODERATE (A) 01/01/2018 0152     Ripudeep Rai M.D. [facility] 01/02/2018, 10:55 AM  Pager: [phone] Between 7am to 7pm - call Pager - [phone]  After 7pm go to [email or web address] - password [email or web address]  Call night coverage person covering after 7pm

## 2018-01-02 NOTE — Progress Notes (Signed)
EEG completed; results pending.    

## 2018-01-02 NOTE — Procedures (Signed)
Date of recording  01/02/2018  Referring physician  Aroor, Lanice Schwab, MD  Reason for the study Altered mental status  Technical Digital EEG recording using 10-20 international electrode system  Description of the recording Posterior dominant rhythm varies between 7-8 Hz but is reactive bilaterally. EEG comprised of generalized beta activity which is probably medication effect such as benzodiazepine. Almost continuous left frontotemporal delta slowing Epileptiform features were not seen during this recording. Sleep was not obtained  Impression The EEG is abnormal and findings are suggestive of  mild generalized cerebral dysfunction.   Focal left frontotemporal slowing suggesting underlying structural defect. Epileptiform features were not seen during this recording.

## 2018-01-02 NOTE — Progress Notes (Addendum)
Subjective: Patient reports improved weakness and confusion  Objective: Vital signs in last 24 hours: Temp:  [97.5 F (36.4 C)-98.6 F (37 C)] 97.6 F (36.4 C) (03/03 0800) Pulse Rate:  [61-107] 74 (03/03 0900) Resp:  [11-24] 18 (03/03 0900) BP: (120-229)/(66-210) 129/69 (03/03 0900) SpO2:  [92 %-100 %] 96 % (03/03 0900) Weight:  [97.9 kg (215 lb 13.3 oz)-99 kg (218 lb 4.1 oz)] 99 kg (218 lb 4.1 oz) (03/03 0500)  Intake/Output from previous day: 03/02 0701 - 03/03 0700 In: 1003 [I.V.:788; IV Piggyback:215] Out: -  Intake/Output this shift: Total I/O In: 120 [I.V.:120] Out: -   Physical Exam: Right hemiparesis improved.  Greater than antigravity strength all groups.  (Patient was up and walking earlier).  Right facial droop persists.  Confusion less marked and speech more fluent.   Lab Results: Recent Labs    01/01/18 0209 01/02/18 0500  WBC 11.3* 15.2*  HGB 14.0 15.4*  HCT 41.0 44.1  PLT 378 421*   BMET Recent Labs    01/01/18 0209 01/02/18 0500  NA 133* 136  K 3.8 3.4*  CL 101 105  CO2 21* 20*  GLUCOSE 147* 161*  BUN 15 16  CREATININE 0.62 0.52  CALCIUM 9.3 9.3    Studies/Results: Ct Head Wo Contrast  Result Date: 01/01/2018 CLINICAL DATA:  Slurred speech and right-sided weakness. EXAM: CT HEAD WITHOUT CONTRAST TECHNIQUE: Contiguous axial images were obtained from the base of the skull through the vertex without intravenous contrast. COMPARISON:  Head CT and brain MRI dated 12/31/2017 FINDINGS: Brain: The masslike area of heterogeneous attenuation, primarily hypoattenuation, surrounding the central left middle cerebral artery and extending from the left temporal lobe through the base a ganglia to the left centrum semiovale, is without change from the previous day's exams. There is still mild midline shift to the right currently measuring 4 mm. There are no new areas of abnormal parenchymal attenuation and no new areas of mass effect. No intracranial hemorrhage.  Vascular: No hyperdense vessel or unexpected calcification. Skull: Normal. Negative for fracture or focal lesion. Sinuses/Orbits: Globes and orbits are unremarkable. Sinuses and mastoid air cells are clear. Other: None. IMPRESSION: 1. No significant change from the previous day's exams. 2. Masslike area of abnormal attenuation, primarily hypoattenuation, with associated mass effect, on the left, centered on the left basal ganglia and surrounding the left middle cerebral artery, is unchanged in extent and degree of mass effect. 3. No new abnormalities.  No intracranial hemorrhage. Electronically Signed   By: Lajean Manes M.D.   On: 01/01/2018 11:35   Ct Head Wo Contrast  Result Date: 12/31/2017 CLINICAL DATA:  Altered level of consciousness. EXAM: CT HEAD WITHOUT CONTRAST TECHNIQUE: Contiguous axial images were obtained from the base of the skull through the vertex without intravenous contrast. COMPARISON:  None. FINDINGS: Brain: Large amount of white matter edema is seen in the left basal ganglia and temporal lobe. This is concerning for underlying neoplasm. This results in 9 mm of left-to-right midline shift. No ventricular dilatation is noted. No definite hemorrhage is noted. Vascular: No hyperdense vessel or unexpected calcification. Skull: Normal. Negative for fracture or focal lesion. Sinuses/Orbits: No acute finding. Other: None. IMPRESSION: Large amount of white matter edema is seen in the left basal ganglia and left temporal lobe concerning for underlying neoplasm. This results in 9 mm of left-to-right midline shift. Further evaluation with MRI with and without gadolinium administration is recommended. Electronically Signed   By: Marijo Conception, M.D.  On: 12/31/2017 21:32   Ct Chest W Contrast  Result Date: 01/01/2018 CLINICAL DATA:  Findings worrisome for CNS lymphoma versus glioma brain MRI. For staging. EXAM: CT CHEST, ABDOMEN, AND PELVIS WITH CONTRAST TECHNIQUE: Multidetector CT imaging of the  chest, abdomen and pelvis was performed following the standard protocol during bolus administration of intravenous contrast. CONTRAST:  163mL ISOVUE-300 IOPAMIDOL (ISOVUE-300) INJECTION 61% COMPARISON:  None. FINDINGS: CT CHEST FINDINGS Cardiovascular: Heart is normal in size.  No pericardial effusion. No evidence of thoracic aortic aneurysm. Mediastinum/Nodes: No suspicious mediastinal, hilar, or axillary lymphadenopathy. Visualized thyroid is unremarkable. Lungs/Pleura: Mild dependent atelectasis in the bilateral lower lobes and posterior left upper lobe. No suspicious pulmonary nodules. No focal consolidation. No pleural effusion or pneumothorax. Musculoskeletal: Visualized osseous structures are within normal limits. CT ABDOMEN PELVIS FINDINGS Hepatobiliary: 13 mm cyst in segment 5 of the liver (series 3/image 56). Layering gallstone (series 3/image 34). No associated inflammatory changes. No intrahepatic or extrahepatic ductal dilatation. Pancreas: Within normal limits. Spleen: Spleen is normal in size. Adrenals/Urinary Tract: Adrenal glands are within normal limits. 3 mm nonobstructing left lower pole renal calculus (series 3/image 70). Multiple left renal cysts, measuring up to 5.1 cm in the posterior left lower kidney (series 3/image 69), measuring simple fluid density (Bosniak I). Right kidney is within normal limits. No hydronephrosis. Bladder is within normal limits. Stomach/Bowel: Stomach is notable for a tiny hiatal hernia. No evidence of bowel obstruction. Normal appendix (series 3/image 92). Mild sigmoid diverticulosis, without evidence of diverticulitis. Vascular/Lymphatic: No evidence of abdominal aortic aneurysm. Atherosclerotic calcifications of the abdominal aorta and branch vessels. No suspicious abdominopelvic lymphadenopathy. Reproductive: Uterus is within normal limits. Bilateral ovaries are within normal limits. Other: No abdominopelvic ascites. Musculoskeletal: Mild degenerative changes of  the lumbar spine. IMPRESSION: No findings suspicious for malignancy (primary or metastatic) in the chest, abdomen, or pelvis. No suspicious lymphadenopathy.  Spleen is normal in size. 3 mm nonobstructing left lower pole renal calculus. Multiple left renal cysts, benign (Bosniak I). Cholelithiasis, without associated inflammatory changes. Electronically Signed   By: Julian Hy M.D.   On: 01/01/2018 09:12   Mr Brain W And Wo Contrast  Result Date: 01/01/2018 CLINICAL DATA:  Confusion and mass demonstrated on earlier head CT EXAM: MRI HEAD WITHOUT AND WITH CONTRAST MRA HEAD WITHOUT CONTRAST TECHNIQUE: Multiplanar, multiecho pulse sequences of the brain and surrounding structures were obtained without and with intravenous contrast. Angiographic images of the head were obtained using MRA technique without contrast. CONTRAST:  77mL MULTIHANCE GADOBENATE DIMEGLUMINE 529 MG/ML IV SOLN COMPARISON:  Head CT 12/31/2017 FINDINGS: MRI HEAD FINDINGS Brain: The midline structures are normal. No focal diffusion restriction to indicate acute infarct. No intraparenchymal hemorrhage. Large amount of hyperintense T2-weighted signal in the left basal ganglia, left insula and left temporal lobe. There is an area of contrast enhancement within the brain parenchyma following the left middle cerebral artery M1 and proximal M2 segments. The largest confluent area of enhancement measures 2.2 x 1.3 cm. Rightward midline shift of 6 mm is unchanged. No chronic microhemorrhage or cerebral amyloid angiopathy. No hydrocephalus, age advanced atrophy or lobar predominant volume loss. No dural abnormality or extra-axial collection. Skull and upper cervical spine: The visualized skull base, calvarium, upper cervical spine and extracranial soft tissues are normal. Sinuses/Orbits: No fluid levels or advanced mucosal thickening. No mastoid effusion. Normal orbits. MRA HEAD FINDINGS Intracranial internal carotid arteries: Normal. Anterior  cerebral arteries: Normal. Middle cerebral arteries: Normal. Posterior communicating arteries: Present on the right. Posterior  cerebral arteries: Normal. Fetal origin on the right. Basilar artery: Normal. Vertebral arteries: Left dominant. Normal. Superior cerebellar arteries: Normal. Anterior inferior cerebellar arteries: Normal. Posterior inferior cerebellar arteries: Normal. IMPRESSION: 1. Masslike parenchymal contrast enhancement adjacent to the left MCA M1 and proximal M2 segments with large area of surrounding hyperintense T2-weighted signal. The appearance is concerning for lymphoma -- including the intravascular variant -- with a large amount of surrounding edema. A primary CNS neoplasm, most likely a high grade glioma, is also a possibility, in which case the abnormal T2WI hyperintensity could indicate edema or non-enhancing tumor. Vasculitis is less likely in the context of normal MRA, but remains a consideration (MRA is less sensitive for detecting findings of vasculitis than CTA or conventional angiography). 2. Normal MRA. 3. Unchanged 6 mm rightward midline shift. Electronically Signed   By: Ulyses Jarred M.D.   On: 01/01/2018 00:39   Ct Abdomen Pelvis W Contrast  Result Date: 01/01/2018 CLINICAL DATA:  Findings worrisome for CNS lymphoma versus glioma brain MRI. For staging. EXAM: CT CHEST, ABDOMEN, AND PELVIS WITH CONTRAST TECHNIQUE: Multidetector CT imaging of the chest, abdomen and pelvis was performed following the standard protocol during bolus administration of intravenous contrast. CONTRAST:  1106mL ISOVUE-300 IOPAMIDOL (ISOVUE-300) INJECTION 61% COMPARISON:  None. FINDINGS: CT CHEST FINDINGS Cardiovascular: Heart is normal in size.  No pericardial effusion. No evidence of thoracic aortic aneurysm. Mediastinum/Nodes: No suspicious mediastinal, hilar, or axillary lymphadenopathy. Visualized thyroid is unremarkable. Lungs/Pleura: Mild dependent atelectasis in the bilateral lower lobes and  posterior left upper lobe. No suspicious pulmonary nodules. No focal consolidation. No pleural effusion or pneumothorax. Musculoskeletal: Visualized osseous structures are within normal limits. CT ABDOMEN PELVIS FINDINGS Hepatobiliary: 13 mm cyst in segment 5 of the liver (series 3/image 56). Layering gallstone (series 3/image 34). No associated inflammatory changes. No intrahepatic or extrahepatic ductal dilatation. Pancreas: Within normal limits. Spleen: Spleen is normal in size. Adrenals/Urinary Tract: Adrenal glands are within normal limits. 3 mm nonobstructing left lower pole renal calculus (series 3/image 70). Multiple left renal cysts, measuring up to 5.1 cm in the posterior left lower kidney (series 3/image 69), measuring simple fluid density (Bosniak I). Right kidney is within normal limits. No hydronephrosis. Bladder is within normal limits. Stomach/Bowel: Stomach is notable for a tiny hiatal hernia. No evidence of bowel obstruction. Normal appendix (series 3/image 92). Mild sigmoid diverticulosis, without evidence of diverticulitis. Vascular/Lymphatic: No evidence of abdominal aortic aneurysm. Atherosclerotic calcifications of the abdominal aorta and branch vessels. No suspicious abdominopelvic lymphadenopathy. Reproductive: Uterus is within normal limits. Bilateral ovaries are within normal limits. Other: No abdominopelvic ascites. Musculoskeletal: Mild degenerative changes of the lumbar spine. IMPRESSION: No findings suspicious for malignancy (primary or metastatic) in the chest, abdomen, or pelvis. No suspicious lymphadenopathy.  Spleen is normal in size. 3 mm nonobstructing left lower pole renal calculus. Multiple left renal cysts, benign (Bosniak I). Cholelithiasis, without associated inflammatory changes. Electronically Signed   By: Julian Hy M.D.   On: 01/01/2018 09:12   Mr Jodene Nam Head (cerebral Arteries)  Result Date: 01/01/2018 CLINICAL DATA:  Confusion and mass demonstrated on earlier  head CT EXAM: MRI HEAD WITHOUT AND WITH CONTRAST MRA HEAD WITHOUT CONTRAST TECHNIQUE: Multiplanar, multiecho pulse sequences of the brain and surrounding structures were obtained without and with intravenous contrast. Angiographic images of the head were obtained using MRA technique without contrast. CONTRAST:  76mL MULTIHANCE GADOBENATE DIMEGLUMINE 529 MG/ML IV SOLN COMPARISON:  Head CT 12/31/2017 FINDINGS: MRI HEAD FINDINGS Brain: The midline structures are normal.  No focal diffusion restriction to indicate acute infarct. No intraparenchymal hemorrhage. Large amount of hyperintense T2-weighted signal in the left basal ganglia, left insula and left temporal lobe. There is an area of contrast enhancement within the brain parenchyma following the left middle cerebral artery M1 and proximal M2 segments. The largest confluent area of enhancement measures 2.2 x 1.3 cm. Rightward midline shift of 6 mm is unchanged. No chronic microhemorrhage or cerebral amyloid angiopathy. No hydrocephalus, age advanced atrophy or lobar predominant volume loss. No dural abnormality or extra-axial collection. Skull and upper cervical spine: The visualized skull base, calvarium, upper cervical spine and extracranial soft tissues are normal. Sinuses/Orbits: No fluid levels or advanced mucosal thickening. No mastoid effusion. Normal orbits. MRA HEAD FINDINGS Intracranial internal carotid arteries: Normal. Anterior cerebral arteries: Normal. Middle cerebral arteries: Normal. Posterior communicating arteries: Present on the right. Posterior cerebral arteries: Normal. Fetal origin on the right. Basilar artery: Normal. Vertebral arteries: Left dominant. Normal. Superior cerebellar arteries: Normal. Anterior inferior cerebellar arteries: Normal. Posterior inferior cerebellar arteries: Normal. IMPRESSION: 1. Masslike parenchymal contrast enhancement adjacent to the left MCA M1 and proximal M2 segments with large area of surrounding hyperintense  T2-weighted signal. The appearance is concerning for lymphoma -- including the intravascular variant -- with a large amount of surrounding edema. A primary CNS neoplasm, most likely a high grade glioma, is also a possibility, in which case the abnormal T2WI hyperintensity could indicate edema or non-enhancing tumor. Vasculitis is less likely in the context of normal MRA, but remains a consideration (MRA is less sensitive for detecting findings of vasculitis than CTA or conventional angiography). 2. Normal MRA. 3. Unchanged 6 mm rightward midline shift. Electronically Signed   By: Ulyses Jarred M.D.   On: 01/01/2018 00:39    Assessment/Plan: Patient is clinically improved.Will require biopsy of brain mass with preoperative Brainlab imaging.  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  I have discussed situation with patient and her family.  Will continue decadron at 6 mg Q 6 Hours through today. EEG pending.  Continue ICU observation and seizure precautions.    LOS: 2 days    Peggyann Shoals, MD 01/02/2018, 9:34 AM

## 2018-01-02 NOTE — Progress Notes (Signed)
Reason for consult:   Subjective: patient back to baseline of R facial droop and right hemiparesis. Awaiting crani for biopsy scheduled for Tuesday   ROS: negative except above   Examination  Vital signs in last 24 hours: Temp:  [97.1 F (36.2 C)-98.1 F (36.7 C)] 97.9 F (36.6 C) (03/03 2000) Pulse Rate:  [64-107] 76 (03/03 2000) Resp:  [10-31] 14 (03/03 2000) BP: (94-213)/(50-195) 141/77 (03/03 2000) SpO2:  [92 %-100 %] 95 % (03/03 2000) Weight:  [99 kg (218 lb 4.1 oz)] 99 kg (218 lb 4.1 oz) (03/03 0500)  General: Not in distress, cooperative CVS: pulse-normal rate and rhythm RS: breathing comfortably Extremities: normal   Neuro: MS: Alert, oriented, follows command CN: pupils equal and reactive,  EOMI, Right facial droop tongue midline, normal sensation over face, Motor: 3/5 strength in RUE, 4/5 in RLE, 5/5 in LUE and LLE Gait: not tested  Basic Metabolic Panel: Recent Labs  Lab 12/31/17 1638 12/31/17 2204 01/01/18 0209 01/02/18 0500  NA 136 135 133* 136  K 3.6 4.5 3.8 3.4*  CL 101 103 101 105  CO2 24  --  21* 20*  GLUCOSE 115* 110* 147* 161*  BUN 17 24* 15 16  CREATININE 0.74 0.60 0.62 0.52  CALCIUM 9.2  --  9.3 9.3  MG  --   --   --  1.9    CBC: Recent Labs  Lab 12/31/17 1638 12/31/17 02/01/2203 01/01/18 0209 01/02/18 0500  WBC 9.4  --  11.3* 15.2*  NEUTROABS 7.9*  --   --   --   HGB 15.3* 13.9 14.0 15.4*  HCT 45.3 41.0 41.0 44.1  MCV 93.4  --  92.3 91.3  PLT 424*  --  378 421*     Coagulation Studies: Recent Labs    12/31/17 February 01, 2155  LABPROT 15.0  INR 1.19    Imaging Reviewed:     ASSESSMENT AND  PLAN   67 y.o. female with PMH of Colon cancer, CAD who presented to ER with headaches, difficulty with speech and confusion with MRI brain concerning for intravascular lymphoma, high grade glioma. Fluctuating of neurological deficits either from edema vs seizures.  Stat CT head showed stable mass effect and midline shift. Neurosurgery  consulted.   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 Stat neurosurgery consult.      Karena Addison Aroor [facility] Pager Number [phone] For questions after 7pm please refer to AMION to reach the Neurologist on call

## 2018-01-02 NOTE — Evaluation (Addendum)
Clinical/Bedside Swallow Evaluation Patient Details  Name: Tara [name] MRN: [id number] Date of Birth: [birth year]  Today's Date: 01/02/2018 Time: SLP Start Time (ACUTE ONLY): 1115 SLP Stop Time (ACUTE ONLY): 1140 SLP Time Calculation (min) (ACUTE ONLY): 25 min  Past Medical History: History reviewed. No pertinent past medical history. Past Surgical History: History reviewed. No pertinent surgical history. HPI:  Patient is a 67 year old female with history of hypertension, presented to ED with confusion, difficulty finishing sentences since the night before the admission. She also complained of slight left frontal headache. Denied any fevers, chills or reported. CT showed large amount of white matter edema in the left basal ganglia, left temporal lobe concerning for underlying neoplasm, 9 mm of left-to-right midline shift. MRI of the brain showed a masslike parenchymal contrast enhancement adjacent to the left MCA M1 and M2 segments with large area of surrounding edema, concerning for lymphoma Versus primary CNS neoplasm likely high-grade would glioma.   Assessment / Plan / Recommendation Clinical Impression   Patient presents with CN VII impairments, with right facial droop and weakness which do not appear to significantly impact swallow function. She does exhibit immediate coughing after single and multiple straw sips of thin liquids, suggestive of decreased airway protection. She does not have overt signs of aspiration with cup sips. Pt able to self feed puree and regular solids with adequate mastication, appearance of timely oral transit and good airway protection. There is good oral clearance, with no pocketing observed despite R facial weakness. Recommend regular diet, thin liquids, no straws, meds whole in puree. Will follow briefly for tolerance. Speech is circumlocutory, and I suspect at least mild expressive aphasia. Please order SLP cognitive-linguistic assessment.    SLP Visit  Diagnosis: Dysphagia, oral phase (R13.11)    Aspiration Risk  Mild aspiration risk    Diet Recommendation Regular;Thin liquid   Liquid Administration via: Cup;No straw Medication Administration: Whole meds with puree Supervision: Patient able to self feed Compensations: Slow rate;Small sips/bites    Other  Recommendations Oral Care Recommendations: Oral care BID   Follow up Recommendations Other (comment)(none for swallow)      Frequency and Duration min 1 x/week  1 week       Prognosis Prognosis for Safe Diet Advancement: Good Barriers to Reach Goals: Language deficits      Swallow Study   General Date of Onset: 12/31/17 HPI: Patient is a 67 year old female with history of hypertension, presented to ED with confusion, difficulty finishing sentences since the night before the admission. She also complained of slight left frontal headache. Denied any fevers, chills or reported. CT showed large amount of white matter edema in the left basal ganglia, left temporal lobe concerning for underlying neoplasm, 9 mm of left-to-right midline shift. MRI of the brain showed a masslike parenchymal contrast enhancement adjacent to the left MCA M1 and M2 segments with large area of surrounding edema, concerning for lymphoma Versus primary CNS neoplasm likely high-grade would glioma. Type of Study: Bedside Swallow Evaluation Previous Swallow Assessment: none on file Diet Prior to this Study: NPO Temperature Spikes Noted: No Respiratory Status: Room air History of Recent Intubation: No Behavior/Cognition: Alert;Cooperative Oral Cavity Assessment: Within Functional Limits Oral Care Completed by SLP: No Oral Cavity - Dentition: Adequate natural dentition Vision: Functional for self-feeding Self-Feeding Abilities: Able to feed self Patient Positioning: Upright in bed Baseline Vocal Quality: Normal Volitional Cough: Strong Volitional Swallow: Able to elicit    Oral/Motor/Sensory Function  Overall Oral Motor/Sensory Function: Moderate impairment Facial  ROM: Reduced right;Suspected CN VII (facial) dysfunction Facial Symmetry: Abnormal symmetry right;Suspected CN VII (facial) dysfunction Facial Strength: Reduced right;Suspected CN VII (facial) dysfunction Facial Sensation: Within Functional Limits Lingual ROM: Within Functional Limits Lingual Symmetry: Within Functional Limits Lingual Strength: Within Functional Limits Velum: Within Functional Limits Mandible: Within Functional Limits   Ice Chips Ice chips: Within functional limits Presentation: Spoon   Thin Liquid Thin Liquid: Impaired Presentation: Cup;Straw Pharyngeal  Phase Impairments: Cough - Immediate(no coughing with cup sips)    Nectar Thick Nectar Thick Liquid: Not tested   Honey Thick Honey Thick Liquid: Not tested   Puree Puree: Within functional limits Presentation: Self Fed;Spoon   Solid   GO   Solid: Within functional limits Presentation: [place], [place], [place] Speech-Language Pathologist [phone]  Aliene Altes 01/02/2018,11:44 AM

## 2018-01-02 NOTE — Progress Notes (Signed)
Patient has been restless and moving even when sleeping, has a hard time keeping arm still when BP cuff inflates. No c/o of pain. Maintained good O2 sats on room air. No changes in neuro assessment. Son slept at bedside.

## 2018-01-03 ENCOUNTER — Other Ambulatory Visit: Payer: Self-pay | Admitting: Neurosurgery

## 2018-01-03 ENCOUNTER — Inpatient Hospital Stay (HOSPITAL_COMMUNITY): Payer: Medicare Other

## 2018-01-03 ENCOUNTER — Encounter (HOSPITAL_COMMUNITY): Payer: Self-pay

## 2018-01-03 LAB — COMPREHENSIVE METABOLIC PANEL
ALT: 21 U/L (ref 14–54)
AST: 19 U/L (ref 15–41)
Albumin: 3.4 g/dL — ABNORMAL LOW (ref 3.5–5.0)
Alkaline Phosphatase: 88 U/L (ref 38–126)
Anion gap: 10 (ref 5–15)
BUN: 18 mg/dL (ref 6–20)
CO2: 22 mmol/L (ref 22–32)
Calcium: 8.8 mg/dL — ABNORMAL LOW (ref 8.9–10.3)
Chloride: 108 mmol/L (ref 101–111)
Creatinine, Ser: 0.55 mg/dL (ref 0.44–1.00)
GFR calc Af Amer: >60 mL/min (ref >60)
GFR calc non Af Amer: >60 mL/min (ref >60)
Glucose, Bld: 149 mg/dL — ABNORMAL HIGH (ref 65–99)
Potassium: 3.7 mmol/L (ref 3.5–5.1)
Sodium: 140 mmol/L (ref 135–145)
Total Bilirubin: 0.7 mg/dL (ref 0.3–1.2)
Total Protein: 6.2 g/dL — ABNORMAL LOW (ref 6.5–8.1)

## 2018-01-03 LAB — MAGNESIUM: Magnesium: 2.2 mg/dL (ref 1.7–2.4)

## 2018-01-03 LAB — CBC WITH DIFFERENTIAL/PLATELET
Basophils Absolute: 0 10*3/uL (ref 0.0–0.1)
Basophils Relative: 0 %
Eosinophils Absolute: 0 10*3/uL (ref 0.0–0.7)
Eosinophils Relative: 0 %
HCT: 42.1 % (ref 36.0–46.0)
Hemoglobin: 14 g/dL (ref 12.0–15.0)
Lymphocytes Relative: 5 %
Lymphs Abs: 0.5 10*3/uL — ABNORMAL LOW (ref 0.7–4.0)
MCH: 31.2 pg (ref 26.0–34.0)
MCHC: 33.3 g/dL (ref 30.0–36.0)
MCV: 93.8 fL (ref 78.0–100.0)
Monocytes Absolute: 0.3 10*3/uL (ref 0.1–1.0)
Monocytes Relative: 3 %
Neutro Abs: 9.6 10*3/uL — ABNORMAL HIGH (ref 1.7–7.7)
Neutrophils Relative %: 92 %
Platelets: 391 10*3/uL (ref 150–400)
RBC: 4.49 MIL/uL (ref 3.87–5.11)
RDW: 13.6 % (ref 11.5–15.5)
WBC: 10.5 10*3/uL (ref 4.0–10.5)

## 2018-01-03 LAB — GLUCOSE, CAPILLARY
Glucose-Capillary: 121 mg/dL — ABNORMAL HIGH (ref 65–99)
Glucose-Capillary: 141 mg/dL — ABNORMAL HIGH (ref 65–99)
Glucose-Capillary: 142 mg/dL — ABNORMAL HIGH (ref 65–99)
Glucose-Capillary: 142 mg/dL — ABNORMAL HIGH (ref 65–99)

## 2018-01-03 MED ORDER — AMLODIPINE BESYLATE 5 MG PO TABS
5.0000 mg | ORAL_TABLET | Freq: Once | ORAL | Status: AC
  Administered 2018-01-03: 5 mg via ORAL
  Filled 2018-01-03: qty 1

## 2018-01-03 MED ORDER — PNEUMOCOCCAL VAC POLYVALENT 25 MCG/0.5ML IJ INJ
0.5000 mL | INJECTION | INTRAMUSCULAR | Status: AC
  Administered 2018-01-04: 0.5 mL via INTRAMUSCULAR

## 2018-01-03 MED ORDER — AMLODIPINE BESYLATE 10 MG PO TABS
10.0000 mg | ORAL_TABLET | Freq: Every day | ORAL | Status: DC
  Administered 2018-01-04: 10 mg via ORAL
  Filled 2018-01-03: qty 1

## 2018-01-03 MED ORDER — GADOBENATE DIMEGLUMINE 529 MG/ML IV SOLN
20.0000 mL | Freq: Once | INTRAVENOUS | Status: AC
  Administered 2018-01-03: 20 mL via INTRAVENOUS

## 2018-01-03 MED ORDER — INFLUENZA VAC SPLIT HIGH-DOSE 0.5 ML IM SUSY
0.5000 mL | PREFILLED_SYRINGE | INTRAMUSCULAR | Status: AC
  Administered 2018-01-04: 0.5 mL via INTRAMUSCULAR
  Filled 2018-01-03: qty 0.5

## 2018-01-03 NOTE — Consult Note (Signed)
PULMONARY / CRITICAL CARE MEDICINE   Name: Tara [name] MRN: [id number] DOB: 1951/10/15    ADMISSION DATE:  12/31/2017 CONSULTATION DATE:  01/01/2018 REFERRING MD:  Tana Coast, Ripudeep  CHIEF COMPLAINT: Left brain mass and seizures  HISTORY OF PRESENT ILLNESS:        This is a 67 year old who reports no significant past medical history until the past 2 days when she was having difficulties with speech and intermittent difficulties using her right side.  She presented to the emergency room on 3/1 and a CT scan of the head was obtained which showed an ill-defined mass with lots of surrounding edema centered in the left MCA territory.  There was a 4 mm left to right shift.  She denies any known prior history of cancer adenopathy or weight loss.  CTs of the chest abdomen and pelvis have not suggested a primary and she is HIV negative.  This afternoon she had overt seizure activity and was loaded with Keppra and transferred to the intensive care unit.  PAST MEDICAL HISTORY :  She  has no past medical history on file.  PAST SURGICAL HISTORY: She  has no past surgical history on file.  No Known Allergies  No current facility-administered medications on file prior to encounter.    Current Outpatient Medications on File Prior to Encounter  Medication Sig  . aspirin-acetaminophen-caffeine (EXCEDRIN MIGRAINE) 250-250-65 MG tablet Take 1 tablet by mouth every 6 (six) hours as needed for headache or migraine.  . Multiple Vitamin (MULTIVITAMIN WITH MINERALS) TABS tablet Take 1 tablet by mouth daily.  Marland Kitchen OVER THE COUNTER MEDICATION Take by mouth See admin instructions. Over the counter diet pills (Keto etc)    FAMILY HISTORY:  Her indicated that her mother is deceased. She indicated that her father is deceased.   SOCIAL HISTORY: She  reports that she has been smoking cigarettes.  She has been smoking about 1.00 pack per day. she has never used smokeless tobacco. She reports that she does not drink  alcohol or use drugs.  REVIEW OF SYSTEMS:   Activity is somewhat limited by a pain in her right hip.  She has no prior known history of seizure or CVA.  She has no known history of respiratory illness no unusual dyspnea, no known heart disease no known chest pain MIs arrhythmias or syncopal episodes in the past.  She denies a history of GI disease no internal bleeding.  She denies diabetes or thyroid disease.  SUBJECTIVE:  As above  VITAL SIGNS: BP (!) 153/77   Pulse 64   Temp (!) 97.2 F (36.2 C) (Axillary)   Resp (!) 26   Ht 5\' 6"  (1.676 m)   Wt 216 lb 11.4 oz (98.3 kg)   SpO2 94%   BMI 34.98 kg/m   HEMODYNAMICS:      INTAKE / OUTPUT: I/O last 3 completed shifts: In: 2595 [P.O.:120; I.V.:2160; IV Piggyback:315] Out: -   PHYSICAL EXAMINATION: General: Middle-aged somewhat obese female in no overt distress Neuro: She is entirely alert and oriented x3.  Pupils are equal and EOMs appear to be full. Able to raise both arms on command although somewhat uncoordinated. Cardiovascular: S1 and S2 normal, regular rate/rhythm. There is no murmur rub or gallop. Lungs: Respirations are unlabored, there is symmetric air movement, no wheezes Abdomen: The abdomen is obese and soft without any organomegaly masses tenderness guarding or rebound, she is anicteric Musculoskeletal: No dependent edema, the limbs are warm   LABS:  BMET Recent  Labs  Lab 01/01/18 0209 01/02/18 0500 01/03/18 0354  NA 133* 136 140  K 3.8 3.4* 3.7  CL 101 105 108  CO2 21* 20* 22  BUN 15 16 18   CREATININE 0.62 0.52 0.55  GLUCOSE 147* 161* 149*    Electrolytes Recent Labs  Lab 01/01/18 0209 01/02/18 0500 01/03/18 0354  CALCIUM 9.3 9.3 8.8*  MG  --  1.9 2.2    CBC Recent Labs  Lab 01/01/18 0209 01/02/18 0500 01/03/18 0354  WBC 11.3* 15.2* 10.5  HGB 14.0 15.4* 14.0  HCT 41.0 44.1 42.1  PLT 378 421* 391    Coag's Recent Labs  Lab 12/31/17 2156  APTT 40*  INR 1.19    Sepsis  Markers No results for input(s): LATICACIDVEN, PROCALCITON, O2SATVEN in the last 168 hours.  ABG No results for input(s): PHART, PCO2ART, PO2ART in the last 168 hours.  Liver Enzymes Recent Labs  Lab 12/31/17 1638 01/01/18 0209 01/03/18 0354  AST 15 15 19   ALT 14 17 21   ALKPHOS 118 113 88  BILITOT 0.7 0.6 0.7  ALBUMIN 4.1 3.7 3.4*    Cardiac Enzymes No results for input(s): TROPONINI, PROBNP in the last 168 hours.  Glucose Recent Labs  Lab 01/01/18 1933 01/02/18 0751 01/02/18 1134 01/02/18 1603 01/02/18 2145 01/03/18 0745  GLUCAP 127* 155* 154* 143* 135* 141*    Imaging No results found.  MRI/MRA Head 01/01/18 IMPRESSION: 1. Masslike parenchymal contrast enhancement adjacent to the left MCA M1 and proximal M2 segments with large area of surrounding hyperintense T2-weighted signal. The appearance is concerning for lymphoma -- including the intravascular variant -- with a large amount of surrounding edema. A primary CNS neoplasm, most likely a high grade glioma, is also a possibility, in which case the abnormal T2WI hyperintensity could indicate edema or non-enhancing tumor. Vasculitis is less likely in the context of normal MRA, but remains a consideration (MRA is less sensitive for detecting findings of vasculitis than CTA or conventional angiography). 2. Normal MRA. 3. Unchanged 6 mm rightward midline shift.   DISCUSSION: This is a 67 year old who presented with altered mental status speech difficulties and confusion.  She was found to have a large ill-defined mass with substantial edema and subsequently has had seizure activity.  She is transferred to the ICU to ensure that her seizures are controlled in anticipation of a stereotactic biopsy.  She was taking no medications prior to admission appears to have no concurrent significant illness.  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  She continues on Decadron for now she has been loaded for Keppra for intermittent seizures.   P: Neurology/Neurosurgery following, remains on Keppra and Decadron. EEG without further seizure activity. Planning for biopsy w/ craniotomy tomorrow per chart review. Repeat MRI today per Neurosurgery.   CV A: Hypertension. P: Norvasc and prn Hydralazine per Hospitalist   Endo:  A: Hyperglycemia, likely steroid induced P: SSI   DVT PPx: Enoxaparin GI PPx: Protonix   Naren Benally, Lynden Oxford, MD  PCCM 01/03/18

## 2018-01-03 NOTE — Progress Notes (Signed)
[facility]                                                                              Patient Demographics  Tara [name], is a 67 y.o. female, DOB - Aug 14, 1951, OEV:[id number]  Admit date - 12/31/2017   Admitting Physician Jani Gravel, MD  Outpatient Primary MD for the patient is Patient, No Pcp Per  Outpatient specialists:   LOS - 3  days   Medical records reviewed and are as summarized below:    Chief Complaint  Patient presents with  . Altered Mental Status       Brief summary   Patient is a 67 year old female with history of hypertension, presented to ED with confusion, difficulty finishing sentences since the night before the admission. She also complained of slight left frontal headache. Denied any fevers, chills or reported. CT showed large amount of white matter edema in the left basal ganglia, left temporal lobe concerning for underlying neoplasm, 9 mm of left-to-right midline shift. Patient was admitted for further workup.    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 Mickeal Skinner - Plan for craniotomy and biopsy by neurosurgery on 3/5 -  continue IV Keppra, IV Decadron. EEG showed no epileptiform discharges.  Active Problems:   Headache, acute metabolic encephalopathy/right-sided hemiparesis: - Right-sided weakness improving - Likely due to #1, continue IV Decadron    Hypertension - BP elevated, increase amlodipine to  10 mg daily  - Continue IV hydralazine as needed with parameters     Hyperglycemia - Likely due to steroids, hemoglobin A1c 5.9 - Continue sliding scale insulin  Code Status: Full code DVT Prophylaxis:  Lovenox  Family Communication: Discussed in detail with the patient, all imaging results, lab results explained to the patient and son at the bedside  Disposition Plan: Craniotomy with biopsy in a.m.   Time Spent in minutes  25 minutes  Procedures:  MRI brain   Consultants:   Neuro surgery  Neurology CCM Neuro-oncology : Dr Mickeal Skinner   Antimicrobials:      Medications  Scheduled Meds: . amLODipine  5 mg Oral Daily  . chlorhexidine  15 mL Mouth Rinse BID  . dexamethasone  6 mg Intravenous Q6H  . enoxaparin (LOVENOX) injection  40 mg Subcutaneous Q24H  . insulin aspart  0-9 Units Subcutaneous TID WC  . mouth rinse  15 mL Mouth Rinse q12n4p  . pantoprazole (PROTONIX) IV  40 mg Intravenous Q24H   Continuous Infusions: . 0.9 % NaCl with KCl 20 mEq / L Stopped (01/03/18 0959)  . levETIRAcetam Stopped (01/03/18 0045)   PRN Meds:.hydrALAZINE, ondansetron (ZOFRAN) IV   Antibiotics   Anti-infectives (  From admission, onward)   None        Subjective:   Tara [name] was seen and examined today. No new events overnight, no seizures. Right-sided weakness improving.  Patient denies dizziness, chest pain, shortness of breath, abdominal pain, N/V/D/C.  Objective:   Vitals:   01/03/18 0600 01/03/18 0700 01/03/18 0800 01/03/18 0900  BP: (!) 141/69 (!) 153/77  (!) 144/104  Pulse: 67 64 88 76  Resp: 13 (!) 26 (!) 33 17  Temp:   97.6 F (36.4 C)   TempSrc:   Axillary   SpO2: 93% 94% 97% 96%  Weight:      Height:        Intake/Output Summary (Last 24 hours) at 01/03/2018 1152 Last data filed at 01/03/2018 0900 Gross per 24 hour  Intake 1540 ml  Output -  Net 1540 ml     Wt Readings from Last 3 Encounters:  01/03/18 98.3 kg (216 lb 11.4 oz)      Exam   General: Alert and oriented x 3, NAD  Eyes:   HEENT:    Cardiovascular: S1 S2 auscultated,  Regular rate and rhythm. No pedal edema b/l  Respiratory: Clear to auscultation bilaterally, no wheezing, rales or rhonchi  Gastrointestinal: Soft, nontender, nondistended, + bowel sounds  Ext: no pedal edema bilaterally  Neuro: no new deficits today  Musculoskeletal: No digital cyanosis, clubbing  Skin: No rashes  Psych: Normal affect and demeanor, alert and oriented x3    Data Reviewed:  I have personally reviewed following labs and imaging studies  Micro Results Recent Results (from the past 240 hour(s))  Urine Culture     Status: Abnormal   Collection Time: 12/31/17  1:45 AM  Result Value Ref Range Status   Specimen Description URINE, RANDOM  Final   Special Requests   Final    NONE Performed at [facility], [address] [place], [place] [address]    Culture MULTIPLE SPECIES PRESENT, SUGGEST RECOLLECTION (A)  Final   Report Status 01/02/2018 FINAL  Final    Radiology Reports Ct Head Wo Contrast  Result Date: 01/01/2018 CLINICAL DATA:  Slurred speech and right-sided weakness. EXAM: CT HEAD WITHOUT CONTRAST TECHNIQUE: Contiguous axial images were obtained from the base of the skull through the vertex without intravenous contrast. COMPARISON:  Head CT and brain MRI dated 12/31/2017 FINDINGS: Brain: The masslike area of heterogeneous attenuation, primarily hypoattenuation, surrounding the central left middle cerebral artery and extending from the left temporal lobe through the base a ganglia to the left centrum semiovale, is without change from the previous day's exams. There is still mild midline shift to the right currently measuring 4 mm. There are no new areas of abnormal parenchymal attenuation and no new areas of mass effect. No intracranial hemorrhage. Vascular: No hyperdense vessel or unexpected calcification. Skull: Normal. Negative for fracture or  focal lesion. Sinuses/Orbits: Globes and orbits are unremarkable. Sinuses and mastoid air cells are clear. Other: None. IMPRESSION: 1. No significant change from the previous day's exams. 2. Masslike area of abnormal attenuation, primarily hypoattenuation, with associated mass effect, on the left, centered on the left basal ganglia and surrounding the left middle cerebral artery, is unchanged in extent and degree of mass effect. 3. No new abnormalities.  No intracranial hemorrhage. Electronically Signed   By: Lajean Manes M.D.   On: 01/01/2018 11:35   Ct Head Wo Contrast  Result Date: 12/31/2017 CLINICAL DATA:  Altered level of consciousness. EXAM: CT HEAD WITHOUT  CONTRAST TECHNIQUE: Contiguous axial images were obtained from the base of the skull through the vertex without intravenous contrast. COMPARISON:  None. FINDINGS: Brain: Large amount of white matter edema is seen in the left basal ganglia and temporal lobe. This is concerning for underlying neoplasm. This results in 9 mm of left-to-right midline shift. No ventricular dilatation is noted. No definite hemorrhage is noted. Vascular: No hyperdense vessel or unexpected calcification. Skull: Normal. Negative for fracture or focal lesion. Sinuses/Orbits: No acute finding. Other: None. IMPRESSION: Large amount of white matter edema is seen in the left basal ganglia and left temporal lobe concerning for underlying neoplasm. This results in 9 mm of left-to-right midline shift. Further evaluation with MRI with and without gadolinium administration is recommended. Electronically Signed   By: Marijo Conception, M.D.   On: 12/31/2017 21:32   Ct Chest W Contrast  Result Date: 01/01/2018 CLINICAL DATA:  Findings worrisome for CNS lymphoma versus glioma brain MRI. For staging. EXAM: CT CHEST, ABDOMEN, AND PELVIS WITH CONTRAST TECHNIQUE: Multidetector CT imaging of the chest, abdomen and pelvis was performed following the standard protocol during bolus administration  of intravenous contrast. CONTRAST:  155mL ISOVUE-300 IOPAMIDOL (ISOVUE-300) INJECTION 61% COMPARISON:  None. FINDINGS: CT CHEST FINDINGS Cardiovascular: Heart is normal in size.  No pericardial effusion. No evidence of thoracic aortic aneurysm. Mediastinum/Nodes: No suspicious mediastinal, hilar, or axillary lymphadenopathy. Visualized thyroid is unremarkable. Lungs/Pleura: Mild dependent atelectasis in the bilateral lower lobes and posterior left upper lobe. No suspicious pulmonary nodules. No focal consolidation. No pleural effusion or pneumothorax. Musculoskeletal: Visualized osseous structures are within normal limits. CT ABDOMEN PELVIS FINDINGS Hepatobiliary: 13 mm cyst in segment 5 of the liver (series 3/image 56). Layering gallstone (series 3/image 34). No associated inflammatory changes. No intrahepatic or extrahepatic ductal dilatation. Pancreas: Within normal limits. Spleen: Spleen is normal in size. Adrenals/Urinary Tract: Adrenal glands are within normal limits. 3 mm nonobstructing left lower pole renal calculus (series 3/image 70). Multiple left renal cysts, measuring up to 5.1 cm in the posterior left lower kidney (series 3/image 69), measuring simple fluid density (Bosniak I). Right kidney is within normal limits. No hydronephrosis. Bladder is within normal limits. Stomach/Bowel: Stomach is notable for a tiny hiatal hernia. No evidence of bowel obstruction. Normal appendix (series 3/image 92). Mild sigmoid diverticulosis, without evidence of diverticulitis. Vascular/Lymphatic: No evidence of abdominal aortic aneurysm. Atherosclerotic calcifications of the abdominal aorta and branch vessels. No suspicious abdominopelvic lymphadenopathy. Reproductive: Uterus is within normal limits. Bilateral ovaries are within normal limits. Other: No abdominopelvic ascites. Musculoskeletal: Mild degenerative changes of the lumbar spine. IMPRESSION: No findings suspicious for malignancy (primary or metastatic) in the  chest, abdomen, or pelvis. No suspicious lymphadenopathy.  Spleen is normal in size. 3 mm nonobstructing left lower pole renal calculus. Multiple left renal cysts, benign (Bosniak I). Cholelithiasis, without associated inflammatory changes. Electronically Signed   By: Julian Hy M.D.   On: 01/01/2018 09:12   Mr Brain W And Wo Contrast  Result Date: 01/01/2018 CLINICAL DATA:  Confusion and mass demonstrated on earlier head CT EXAM: MRI HEAD WITHOUT AND WITH CONTRAST MRA HEAD WITHOUT CONTRAST TECHNIQUE: Multiplanar, multiecho pulse sequences of the brain and surrounding structures were obtained without and with intravenous contrast. Angiographic images of the head were obtained using MRA technique without contrast. CONTRAST:  88mL MULTIHANCE GADOBENATE DIMEGLUMINE 529 MG/ML IV SOLN COMPARISON:  Head CT 12/31/2017 FINDINGS: MRI HEAD FINDINGS Brain: The midline structures are normal. No focal diffusion restriction to indicate acute infarct. No  intraparenchymal hemorrhage. Large amount of hyperintense T2-weighted signal in the left basal ganglia, left insula and left temporal lobe. There is an area of contrast enhancement within the brain parenchyma following the left middle cerebral artery M1 and proximal M2 segments. The largest confluent area of enhancement measures 2.2 x 1.3 cm. Rightward midline shift of 6 mm is unchanged. No chronic microhemorrhage or cerebral amyloid angiopathy. No hydrocephalus, age advanced atrophy or lobar predominant volume loss. No dural abnormality or extra-axial collection. Skull and upper cervical spine: The visualized skull base, calvarium, upper cervical spine and extracranial soft tissues are normal. Sinuses/Orbits: No fluid levels or advanced mucosal thickening. No mastoid effusion. Normal orbits. MRA HEAD FINDINGS Intracranial internal carotid arteries: Normal. Anterior cerebral arteries: Normal. Middle cerebral arteries: Normal. Posterior communicating arteries: Present on  the right. Posterior cerebral arteries: Normal. Fetal origin on the right. Basilar artery: Normal. Vertebral arteries: Left dominant. Normal. Superior cerebellar arteries: Normal. Anterior inferior cerebellar arteries: Normal. Posterior inferior cerebellar arteries: Normal. IMPRESSION: 1. Masslike parenchymal contrast enhancement adjacent to the left MCA M1 and proximal M2 segments with large area of surrounding hyperintense T2-weighted signal. The appearance is concerning for lymphoma -- including the intravascular variant -- with a large amount of surrounding edema. A primary CNS neoplasm, most likely a high grade glioma, is also a possibility, in which case the abnormal T2WI hyperintensity could indicate edema or non-enhancing tumor. Vasculitis is less likely in the context of normal MRA, but remains a consideration (MRA is less sensitive for detecting findings of vasculitis than CTA or conventional angiography). 2. Normal MRA. 3. Unchanged 6 mm rightward midline shift. Electronically Signed   By: Ulyses Jarred M.D.   On: 01/01/2018 00:39   Ct Abdomen Pelvis W Contrast  Result Date: 01/01/2018 CLINICAL DATA:  Findings worrisome for CNS lymphoma versus glioma brain MRI. For staging. EXAM: CT CHEST, ABDOMEN, AND PELVIS WITH CONTRAST TECHNIQUE: Multidetector CT imaging of the chest, abdomen and pelvis was performed following the standard protocol during bolus administration of intravenous contrast. CONTRAST:  159mL ISOVUE-300 IOPAMIDOL (ISOVUE-300) INJECTION 61% COMPARISON:  None. FINDINGS: CT CHEST FINDINGS Cardiovascular: Heart is normal in size.  No pericardial effusion. No evidence of thoracic aortic aneurysm. Mediastinum/Nodes: No suspicious mediastinal, hilar, or axillary lymphadenopathy. Visualized thyroid is unremarkable. Lungs/Pleura: Mild dependent atelectasis in the bilateral lower lobes and posterior left upper lobe. No suspicious pulmonary nodules. No focal consolidation. No pleural effusion or  pneumothorax. Musculoskeletal: Visualized osseous structures are within normal limits. CT ABDOMEN PELVIS FINDINGS Hepatobiliary: 13 mm cyst in segment 5 of the liver (series 3/image 56). Layering gallstone (series 3/image 34). No associated inflammatory changes. No intrahepatic or extrahepatic ductal dilatation. Pancreas: Within normal limits. Spleen: Spleen is normal in size. Adrenals/Urinary Tract: Adrenal glands are within normal limits. 3 mm nonobstructing left lower pole renal calculus (series 3/image 70). Multiple left renal cysts, measuring up to 5.1 cm in the posterior left lower kidney (series 3/image 69), measuring simple fluid density (Bosniak I). Right kidney is within normal limits. No hydronephrosis. Bladder is within normal limits. Stomach/Bowel: Stomach is notable for a tiny hiatal hernia. No evidence of bowel obstruction. Normal appendix (series 3/image 92). Mild sigmoid diverticulosis, without evidence of diverticulitis. Vascular/Lymphatic: No evidence of abdominal aortic aneurysm. Atherosclerotic calcifications of the abdominal aorta and branch vessels. No suspicious abdominopelvic lymphadenopathy. Reproductive: Uterus is within normal limits. Bilateral ovaries are within normal limits. Other: No abdominopelvic ascites. Musculoskeletal: Mild degenerative changes of the lumbar spine. IMPRESSION: No findings suspicious for malignancy (primary  or metastatic) in the chest, abdomen, or pelvis. No suspicious lymphadenopathy.  Spleen is normal in size. 3 mm nonobstructing left lower pole renal calculus. Multiple left renal cysts, benign (Bosniak I). Cholelithiasis, without associated inflammatory changes. Electronically Signed   By: Julian Hy M.D.   On: 01/01/2018 09:12   Mr Jodene Nam Head (cerebral Arteries)  Result Date: 01/01/2018 CLINICAL DATA:  Confusion and mass demonstrated on earlier head CT EXAM: MRI HEAD WITHOUT AND WITH CONTRAST MRA HEAD WITHOUT CONTRAST TECHNIQUE: Multiplanar, multiecho  pulse sequences of the brain and surrounding structures were obtained without and with intravenous contrast. Angiographic images of the head were obtained using MRA technique without contrast. CONTRAST:  49mL MULTIHANCE GADOBENATE DIMEGLUMINE 529 MG/ML IV SOLN COMPARISON:  Head CT 12/31/2017 FINDINGS: MRI HEAD FINDINGS Brain: The midline structures are normal. No focal diffusion restriction to indicate acute infarct. No intraparenchymal hemorrhage. Large amount of hyperintense T2-weighted signal in the left basal ganglia, left insula and left temporal lobe. There is an area of contrast enhancement within the brain parenchyma following the left middle cerebral artery M1 and proximal M2 segments. The largest confluent area of enhancement measures 2.2 x 1.3 cm. Rightward midline shift of 6 mm is unchanged. No chronic microhemorrhage or cerebral amyloid angiopathy. No hydrocephalus, age advanced atrophy or lobar predominant volume loss. No dural abnormality or extra-axial collection. Skull and upper cervical spine: The visualized skull base, calvarium, upper cervical spine and extracranial soft tissues are normal. Sinuses/Orbits: No fluid levels or advanced mucosal thickening. No mastoid effusion. Normal orbits. MRA HEAD FINDINGS Intracranial internal carotid arteries: Normal. Anterior cerebral arteries: Normal. Middle cerebral arteries: Normal. Posterior communicating arteries: Present on the right. Posterior cerebral arteries: Normal. Fetal origin on the right. Basilar artery: Normal. Vertebral arteries: Left dominant. Normal. Superior cerebellar arteries: Normal. Anterior inferior cerebellar arteries: Normal. Posterior inferior cerebellar arteries: Normal. IMPRESSION: 1. Masslike parenchymal contrast enhancement adjacent to the left MCA M1 and proximal M2 segments with large area of surrounding hyperintense T2-weighted signal. The appearance is concerning for lymphoma -- including the intravascular variant -- with a  large amount of surrounding edema. A primary CNS neoplasm, most likely a high grade glioma, is also a possibility, in which case the abnormal T2WI hyperintensity could indicate edema or non-enhancing tumor. Vasculitis is less likely in the context of normal MRA, but remains a consideration (MRA is less sensitive for detecting findings of vasculitis than CTA or conventional angiography). 2. Normal MRA. 3. Unchanged 6 mm rightward midline shift. Electronically Signed   By: Ulyses Jarred M.D.   On: 01/01/2018 00:39    Lab Data:  CBC: Recent Labs  Lab 12/31/17 1638 12/31/17 2204 01/01/18 0209 01/02/18 0500 01/03/18 0354  WBC 9.4  --  11.3* 15.2* 10.5  NEUTROABS 7.9*  --   --   --  9.6*  HGB 15.3* 13.9 14.0 15.4* 14.0  HCT 45.3 41.0 41.0 44.1 42.1  MCV 93.4  --  92.3 91.3 93.8  PLT 424*  --  378 421* 856   Basic Metabolic Panel: Recent Labs  Lab 12/31/17 1638 12/31/17 2204 01/01/18 0209 01/02/18 0500 01/03/18 0354  NA 136 135 133* 136 140  K 3.6 4.5 3.8 3.4* 3.7  CL 101 103 101 105 108  CO2 24  --  21* 20* 22  GLUCOSE 115* 110* 147* 161* 149*  BUN 17 24* 15 16 18   CREATININE 0.74 0.60 0.62 0.52 0.55  CALCIUM 9.2  --  9.3 9.3 8.8*  MG  --   --   --  1.9 2.2   GFR: Estimated Creatinine Clearance: 81.8 mL/min (by C-G formula based on SCr of 0.55 mg/dL). Liver Function Tests: Recent Labs  Lab 12/31/17 1638 01/01/18 0209 01/03/18 0354  AST 15 15 19   ALT 14 17 21   ALKPHOS 118 113 88  BILITOT 0.7 0.6 0.7  PROT 7.2 6.5 6.2*  ALBUMIN 4.1 3.7 3.4*   No results for input(s): LIPASE, AMYLASE in the last 168 hours. No results for input(s): AMMONIA in the last 168 hours. Coagulation Profile: Recent Labs  Lab 12/31/17 2156  INR 1.19   Cardiac Enzymes: No results for input(s): CKTOTAL, CKMB, CKMBINDEX, TROPONINI in the last 168 hours. BNP (last 3 results) No results for input(s): PROBNP in the last 8760 hours. HbA1C: Recent Labs    01/01/18 1617  HGBA1C 5.9*    CBG: Recent Labs  Lab 01/02/18 1134 01/02/18 1603 01/02/18 2145 01/03/18 0745 01/03/18 1125  GLUCAP 154* 143* 135* 141* 121*   Lipid Profile: No results for input(s): CHOL, HDL, LDLCALC, TRIG, CHOLHDL, LDLDIRECT in the last 72 hours. Thyroid Function Tests: No results for input(s): TSH, T4TOTAL, FREET4, T3FREE, THYROIDAB in the last 72 hours. Anemia Panel: Recent Labs    01/01/18 0209  VITAMINB12 274   Urine analysis:    Component Value Date/Time   COLORURINE YELLOW 01/01/2018 0152   APPEARANCEUR HAZY (A) 01/01/2018 0152   LABSPEC 1.024 01/01/2018 0152   PHURINE 5.0 01/01/2018 0152   GLUCOSEU NEGATIVE 01/01/2018 0152   HGBUR LARGE (A) 01/01/2018 0152   BILIRUBINUR NEGATIVE 01/01/2018 [place] 01/01/2018 0152   PROTEINUR NEGATIVE 01/01/2018 0152   NITRITE NEGATIVE 01/01/2018 0152   LEUKOCYTESUR MODERATE (A) 01/01/2018 0152     Alanys Godino M.D. [facility] 01/03/2018, 11:52 AM  Pager: [phone] Between 7am to 7pm - call Pager - [phone]  After 7pm go to [email or web address] - password [email or web address]  Call night coverage person covering after 7pm

## 2018-01-03 NOTE — Progress Notes (Addendum)
Subjective: Patient reports "I haven't had a shower in two days"  Objective: Vital signs in last 24 hours: Temp:  [97.1 F (36.2 C)-98.4 F (36.9 C)] 97.2 F (36.2 C) (03/04 0420) Pulse Rate:  [55-96] 67 (03/04 0600) Resp:  [10-31] 13 (03/04 0600) BP: (94-162)/(50-108) 141/69 (03/04 0600) SpO2:  [92 %-100 %] 93 % (03/04 0600) Weight:  [98.3 kg (216 lb 11.4 oz)] 98.3 kg (216 lb 11.4 oz) (03/04 0500)  Intake/Output from previous day: 03/03 0701 - 03/04 0700 In: 1600 [P.O.:120; I.V.:1380; IV Piggyback:100] Out: -  Intake/Output this shift: No intake/output data recorded.  awakens to voice. Some slurring, but markedly improved based on reports. Right side weakness, but cooperates throughout exam with all extremities. Family present.   Lab Results: Recent Labs    01/02/18 0500 01/03/18 0354  WBC 15.2* 10.5  HGB 15.4* 14.0  HCT 44.1 42.1  PLT 421* 391   BMET Recent Labs    01/02/18 0500 01/03/18 0354  NA 136 140  K 3.4* 3.7  CL 105 108  CO2 20* 22  GLUCOSE 161* 149*  BUN 16 18  CREATININE 0.52 0.55  CALCIUM 9.3 8.8*    Studies/Results: Ct Head Wo Contrast  Result Date: 01/01/2018 CLINICAL DATA:  Slurred speech and right-sided weakness. EXAM: CT HEAD WITHOUT CONTRAST TECHNIQUE: Contiguous axial images were obtained from the base of the skull through the vertex without intravenous contrast. COMPARISON:  Head CT and brain MRI dated 12/31/2017 FINDINGS: Brain: The masslike area of heterogeneous attenuation, primarily hypoattenuation, surrounding the central left middle cerebral artery and extending from the left temporal lobe through the base a ganglia to the left centrum semiovale, is without change from the previous day's exams. There is still mild midline shift to the right currently measuring 4 mm. There are no new areas of abnormal parenchymal attenuation and no new areas of mass effect. No intracranial hemorrhage. Vascular: No hyperdense vessel or unexpected  calcification. Skull: Normal. Negative for fracture or focal lesion. Sinuses/Orbits: Globes and orbits are unremarkable. Sinuses and mastoid air cells are clear. Other: None. IMPRESSION: 1. No significant change from the previous day's exams. 2. Masslike area of abnormal attenuation, primarily hypoattenuation, with associated mass effect, on the left, centered on the left basal ganglia and surrounding the left middle cerebral artery, is unchanged in extent and degree of mass effect. 3. No new abnormalities.  No intracranial hemorrhage. Electronically Signed   By: Lajean Manes M.D.   On: 01/01/2018 11:35    Assessment/Plan:   LOS: 3 days  Planning for craniotomy for biopsy tomorrow. Pt aware and agreeable.    Verdis Prime 01/03/2018, 7:39 AM  Plan craniotomy and biopsy tomorrow.

## 2018-01-03 NOTE — H&P (View-Only) (Signed)
Subjective: Patient reports "I haven't had a shower in two days"  Objective: Vital signs in last 24 hours: Temp:  [97.1 F (36.2 C)-98.4 F (36.9 C)] 97.2 F (36.2 C) (03/04 0420) Pulse Rate:  [55-96] 67 (03/04 0600) Resp:  [10-31] 13 (03/04 0600) BP: (94-162)/(50-108) 141/69 (03/04 0600) SpO2:  [92 %-100 %] 93 % (03/04 0600) Weight:  [98.3 kg (216 lb 11.4 oz)] 98.3 kg (216 lb 11.4 oz) (03/04 0500)  Intake/Output from previous day: 03/03 0701 - 03/04 0700 In: 1600 [P.O.:120; I.V.:1380; IV Piggyback:100] Out: -  Intake/Output this shift: No intake/output data recorded.  awakens to voice. Some slurring, but markedly improved based on reports. Right side weakness, but cooperates throughout exam with all extremities. Family present.   Lab Results: Recent Labs    01/02/18 0500 01/03/18 0354  WBC 15.2* 10.5  HGB 15.4* 14.0  HCT 44.1 42.1  PLT 421* 391   BMET Recent Labs    01/02/18 0500 01/03/18 0354  NA 136 140  K 3.4* 3.7  CL 105 108  CO2 20* 22  GLUCOSE 161* 149*  BUN 16 18  CREATININE 0.52 0.55  CALCIUM 9.3 8.8*    Studies/Results: Ct Head Wo Contrast  Result Date: 01/01/2018 CLINICAL DATA:  Slurred speech and right-sided weakness. EXAM: CT HEAD WITHOUT CONTRAST TECHNIQUE: Contiguous axial images were obtained from the base of the skull through the vertex without intravenous contrast. COMPARISON:  Head CT and brain MRI dated 12/31/2017 FINDINGS: Brain: The masslike area of heterogeneous attenuation, primarily hypoattenuation, surrounding the central left middle cerebral artery and extending from the left temporal lobe through the base a ganglia to the left centrum semiovale, is without change from the previous day's exams. There is still mild midline shift to the right currently measuring 4 mm. There are no new areas of abnormal parenchymal attenuation and no new areas of mass effect. No intracranial hemorrhage. Vascular: No hyperdense vessel or unexpected  calcification. Skull: Normal. Negative for fracture or focal lesion. Sinuses/Orbits: Globes and orbits are unremarkable. Sinuses and mastoid air cells are clear. Other: None. IMPRESSION: 1. No significant change from the previous day's exams. 2. Masslike area of abnormal attenuation, primarily hypoattenuation, with associated mass effect, on the left, centered on the left basal ganglia and surrounding the left middle cerebral artery, is unchanged in extent and degree of mass effect. 3. No new abnormalities.  No intracranial hemorrhage. Electronically Signed   By: Lajean Manes M.D.   On: 01/01/2018 11:35    Assessment/Plan:   LOS: 3 days  Planning for craniotomy for biopsy tomorrow. Pt aware and agreeable.    Verdis Prime 01/03/2018, 7:39 AM  Plan craniotomy and biopsy tomorrow.

## 2018-01-04 ENCOUNTER — Encounter (HOSPITAL_COMMUNITY)
Admission: EM | Disposition: A | Discharge status: Rehabilitation Facility | Payer: Self-pay | Source: Home / Self Care | Attending: Internal Medicine

## 2018-01-04 ENCOUNTER — Encounter (HOSPITAL_COMMUNITY): Payer: Self-pay

## 2018-01-04 ENCOUNTER — Inpatient Hospital Stay (HOSPITAL_COMMUNITY): Payer: Medicare Other | Admitting: Anesthesiology

## 2018-01-04 HISTORY — PX: PR DURAL GRAFT SPINAL: 63710

## 2018-01-04 HISTORY — PX: APPLICATION OF CRANIAL NAVIGATION: SHX6578

## 2018-01-04 LAB — BASIC METABOLIC PANEL
Anion gap: 8 (ref 5–15)
BUN: 18 mg/dL (ref 6–20)
CO2: 23 mmol/L (ref 22–32)
Calcium: 8.5 mg/dL — ABNORMAL LOW (ref 8.9–10.3)
Chloride: 107 mmol/L (ref 101–111)
Creatinine, Ser: 0.55 mg/dL (ref 0.44–1.00)
GFR calc Af Amer: >60 mL/min (ref >60)
GFR calc non Af Amer: >60 mL/min (ref >60)
Glucose, Bld: 149 mg/dL — ABNORMAL HIGH (ref 65–99)
Potassium: 3.8 mmol/L (ref 3.5–5.1)
Sodium: 138 mmol/L (ref 135–145)

## 2018-01-04 LAB — TYPE AND SCREEN
ABO/RH(D): O POS
Antibody Screen: NEGATIVE

## 2018-01-04 LAB — ABO/RH: ABO/RH(D): O POS

## 2018-01-04 LAB — GLUCOSE, CAPILLARY
Glucose-Capillary: 128 mg/dL — ABNORMAL HIGH (ref 65–99)
Glucose-Capillary: 119 mg/dL — ABNORMAL HIGH (ref 65–99)

## 2018-01-04 LAB — CBC
HCT: 40.9 % (ref 36.0–46.0)
Hemoglobin: 13.7 g/dL (ref 12.0–15.0)
MCH: 31.1 pg (ref 26.0–34.0)
MCHC: 33.5 g/dL (ref 30.0–36.0)
MCV: 93 fL (ref 78.0–100.0)
Platelets: 374 10*3/uL (ref 150–400)
RBC: 4.4 MIL/uL (ref 3.87–5.11)
RDW: 13.2 % (ref 11.5–15.5)
WBC: 9.3 10*3/uL (ref 4.0–10.5)

## 2018-01-04 SURGERY — FRAMELESS STEREOTACTIC BIOPSY
Anesthesia: General | Site: Head

## 2018-01-04 MED ORDER — LIDOCAINE-EPINEPHRINE 1 %-1:100000 IJ SOLN
INTRAMUSCULAR | Status: AC
  Filled 2018-01-04: qty 1

## 2018-01-04 MED ORDER — PROMETHAZINE HCL 25 MG/ML IJ SOLN: 6.2500 mg | INTRAMUSCULAR | Status: DC | PRN

## 2018-01-04 MED ORDER — DEXAMETHASONE SODIUM PHOSPHATE 4 MG/ML IJ SOLN
INTRAMUSCULAR | Status: DC | PRN
  Administered 2018-01-04: 5 mg via INTRAVENOUS

## 2018-01-04 MED ORDER — ACETAMINOPHEN 650 MG RE SUPP: 650.0000 mg | RECTAL | Status: DC | PRN

## 2018-01-04 MED ORDER — CEFAZOLIN SODIUM-DEXTROSE 2-4 GM/100ML-% IV SOLN
2.0000 g | Freq: Three times a day (TID) | INTRAVENOUS | Status: AC
  Administered 2018-01-04 – 2018-01-05 (×2): 2 g via INTRAVENOUS
  Filled 2018-01-04 (×2): qty 100

## 2018-01-04 MED ORDER — POTASSIUM CHLORIDE IN NACL 20-0.9 MEQ/L-% IV SOLN
INTRAVENOUS | Status: DC
  Administered 2018-01-04: 21:00:00 via INTRAVENOUS
  Filled 2018-01-04 (×2): qty 1000

## 2018-01-04 MED ORDER — 0.9 % SODIUM CHLORIDE (POUR BTL) OPTIME
TOPICAL | Status: DC | PRN
  Administered 2018-01-04 (×3): 1000 mL

## 2018-01-04 MED ORDER — BACITRACIN ZINC 500 UNIT/GM EX OINT
TOPICAL_OINTMENT | CUTANEOUS | Status: AC
  Filled 2018-01-04: qty 28.35

## 2018-01-04 MED ORDER — LIDOCAINE-EPINEPHRINE 1 %-1:100000 IJ SOLN
INTRAMUSCULAR | Status: DC | PRN
  Administered 2018-01-04: 9 mL

## 2018-01-04 MED ORDER — CEFAZOLIN SODIUM-DEXTROSE 2-4 GM/100ML-% IV SOLN
2.0000 g | INTRAVENOUS | Status: AC
  Administered 2018-01-04: 2 g via INTRAVENOUS
  Filled 2018-01-04 (×2): qty 100

## 2018-01-04 MED ORDER — HYDROCODONE-ACETAMINOPHEN 5-325 MG PO TABS: 1.0000 | ORAL_TABLET | ORAL | Status: DC | PRN

## 2018-01-04 MED ORDER — SUGAMMADEX SODIUM 200 MG/2ML IV SOLN
INTRAVENOUS | Status: AC
  Filled 2018-01-04: qty 2

## 2018-01-04 MED ORDER — PROMETHAZINE HCL 25 MG PO TABS: 12.5000 mg | ORAL_TABLET | ORAL | Status: DC | PRN

## 2018-01-04 MED ORDER — FLEET ENEMA 7-19 GM/118ML RE ENEM: 1.0000 | ENEMA | Freq: Once | RECTAL | Status: DC | PRN

## 2018-01-04 MED ORDER — PROPOFOL 10 MG/ML IV BOLUS
INTRAVENOUS | Status: AC
Start: 2018-01-04 — End: ?
  Filled 2018-01-04: qty 20

## 2018-01-04 MED ORDER — GELATIN ABSORBABLE MT POWD
OROMUCOSAL | Status: DC | PRN
  Administered 2018-01-04: 18:00:00 via TOPICAL

## 2018-01-04 MED ORDER — CHLORHEXIDINE GLUCONATE CLOTH 2 % EX PADS
6.0000 | MEDICATED_PAD | Freq: Once | CUTANEOUS | Status: AC
  Administered 2018-01-04: 6 via TOPICAL

## 2018-01-04 MED ORDER — DEXAMETHASONE SODIUM PHOSPHATE 4 MG/ML IJ SOLN
4.0000 mg | Freq: Three times a day (TID) | INTRAMUSCULAR | Status: DC
  Filled 2018-01-04: qty 1

## 2018-01-04 MED ORDER — DEXAMETHASONE SODIUM PHOSPHATE 4 MG/ML IJ SOLN
4.0000 mg | Freq: Four times a day (QID) | INTRAMUSCULAR | Status: DC
  Administered 2018-01-06 (×3): 4 mg via INTRAVENOUS
  Filled 2018-01-04 (×2): qty 1

## 2018-01-04 MED ORDER — PANTOPRAZOLE SODIUM 40 MG IV SOLR
40.0000 mg | Freq: Every day | INTRAVENOUS | Status: DC
  Administered 2018-01-04 – 2018-01-05 (×2): 40 mg via INTRAVENOUS
  Filled 2018-01-04 (×3): qty 40

## 2018-01-04 MED ORDER — ROCURONIUM BROMIDE 100 MG/10ML IV SOLN
INTRAVENOUS | Status: DC | PRN
  Administered 2018-01-04: 20 mg via INTRAVENOUS
  Administered 2018-01-04: 70 mg via INTRAVENOUS
  Administered 2018-01-04: 10 mg via INTRAVENOUS
  Administered 2018-01-04 (×2): 20 mg via INTRAVENOUS

## 2018-01-04 MED ORDER — SUGAMMADEX SODIUM 200 MG/2ML IV SOLN
INTRAVENOUS | Status: DC | PRN
  Administered 2018-01-04: 200 mg via INTRAVENOUS

## 2018-01-04 MED ORDER — THROMBIN (RECOMBINANT) 5000 UNITS EX SOLR
CUTANEOUS | Status: DC | PRN
  Administered 2018-01-04 (×2): 5000 [IU] via TOPICAL

## 2018-01-04 MED ORDER — ESMOLOL HCL 100 MG/10ML IV SOLN
INTRAVENOUS | Status: AC
  Filled 2018-01-04: qty 10

## 2018-01-04 MED ORDER — DEXAMETHASONE SODIUM PHOSPHATE 10 MG/ML IJ SOLN
6.0000 mg | Freq: Four times a day (QID) | INTRAMUSCULAR | Status: AC
  Administered 2018-01-05 (×4): 6 mg via INTRAVENOUS
  Filled 2018-01-04 (×4): qty 1

## 2018-01-04 MED ORDER — BUPIVACAINE HCL (PF) 0.5 % IJ SOLN
INTRAMUSCULAR | Status: DC | PRN
  Administered 2018-01-04: 9 mL

## 2018-01-04 MED ORDER — THROMBIN 5000 UNITS EX SOLR
CUTANEOUS | Status: AC
  Filled 2018-01-04: qty 10000

## 2018-01-04 MED ORDER — MEPERIDINE HCL 50 MG/ML IJ SOLN: 6.2500 mg | INTRAMUSCULAR | Status: DC | PRN

## 2018-01-04 MED ORDER — LIDOCAINE HCL (CARDIAC) 20 MG/ML IV SOLN
INTRAVENOUS | Status: DC | PRN
  Administered 2018-01-04: 60 mg via INTRAVENOUS

## 2018-01-04 MED ORDER — MIDAZOLAM HCL 2 MG/2ML IJ SOLN
INTRAMUSCULAR | Status: AC
  Filled 2018-01-04: qty 2

## 2018-01-04 MED ORDER — SODIUM CHLORIDE 0.9 % IV SOLN
INTRAVENOUS | Status: DC
  Administered 2018-01-04 (×2): via INTRAVENOUS

## 2018-01-04 MED ORDER — LABETALOL HCL 5 MG/ML IV SOLN
INTRAVENOUS | Status: AC
  Filled 2018-01-04: qty 4

## 2018-01-04 MED ORDER — PROPOFOL 10 MG/ML IV BOLUS
INTRAVENOUS | Status: DC | PRN
  Administered 2018-01-04: 50 mg via INTRAVENOUS
  Administered 2018-01-04: 200 mg via INTRAVENOUS

## 2018-01-04 MED ORDER — BACITRACIN ZINC 500 UNIT/GM EX OINT
TOPICAL_OINTMENT | CUTANEOUS | Status: DC | PRN
Start: 2018-01-04 — End: 2018-01-04
  Administered 2018-01-04 (×2): 1 via TOPICAL

## 2018-01-04 MED ORDER — DOCUSATE SODIUM 100 MG PO CAPS
100.0000 mg | ORAL_CAPSULE | Freq: Two times a day (BID) | ORAL | Status: DC
  Administered 2018-01-04 – 2018-01-06 (×3): 100 mg via ORAL
  Filled 2018-01-04 (×4): qty 1

## 2018-01-04 MED ORDER — MORPHINE SULFATE (PF) 4 MG/ML IV SOLN: 2.0000 mg | INTRAVENOUS | Status: DC | PRN

## 2018-01-04 MED ORDER — THROMBIN 5000 UNITS EX SOLR
CUTANEOUS | Status: AC
  Filled 2018-01-04: qty 5000

## 2018-01-04 MED ORDER — FENTANYL CITRATE (PF) 100 MCG/2ML IJ SOLN
INTRAMUSCULAR | Status: DC | PRN
  Administered 2018-01-04: 150 ug via INTRAVENOUS

## 2018-01-04 MED ORDER — BUPIVACAINE HCL (PF) 0.5 % IJ SOLN
INTRAMUSCULAR | Status: AC
  Filled 2018-01-04: qty 30

## 2018-01-04 MED ORDER — HEMOSTATIC AGENTS (NO CHARGE) OPTIME
TOPICAL | Status: DC | PRN
  Administered 2018-01-04: 1 via TOPICAL

## 2018-01-04 MED ORDER — POLYETHYLENE GLYCOL 3350 17 G PO PACK: 17.0000 g | PACK | Freq: Every day | ORAL | Status: DC | PRN

## 2018-01-04 MED ORDER — HYDROMORPHONE HCL 1 MG/ML IJ SOLN
0.2500 mg | INTRAMUSCULAR | Status: DC | PRN
  Administered 2018-01-04: 0.5 mg via INTRAVENOUS

## 2018-01-04 MED ORDER — PHENYLEPHRINE HCL 10 MG/ML IJ SOLN
INTRAVENOUS | Status: DC | PRN
  Administered 2018-01-04: 30 ug/min via INTRAVENOUS

## 2018-01-04 MED ORDER — ONDANSETRON HCL 4 MG PO TABS: 4.0000 mg | ORAL_TABLET | ORAL | Status: DC | PRN

## 2018-01-04 MED ORDER — ONDANSETRON HCL 4 MG/2ML IJ SOLN
INTRAMUSCULAR | Status: DC | PRN
  Administered 2018-01-04: 4 mg via INTRAVENOUS

## 2018-01-04 MED ORDER — SODIUM CHLORIDE 0.9 % IV SOLN
0.0500 ug/kg/min | INTRAVENOUS | Status: AC
  Administered 2018-01-04: 0.1 ug/kg/min via INTRAVENOUS
  Filled 2018-01-04: qty 5000

## 2018-01-04 MED ORDER — LABETALOL HCL 5 MG/ML IV SOLN
10.0000 mg | INTRAVENOUS | Status: DC | PRN
  Administered 2018-01-04 – 2018-01-06 (×2): 10 mg via INTRAVENOUS
  Filled 2018-01-04: qty 4

## 2018-01-04 MED ORDER — FENTANYL CITRATE (PF) 250 MCG/5ML IJ SOLN
INTRAMUSCULAR | Status: AC
  Filled 2018-01-04: qty 5

## 2018-01-04 MED ORDER — CHLORHEXIDINE GLUCONATE CLOTH 2 % EX PADS: 6.0000 | MEDICATED_PAD | Freq: Once | CUTANEOUS | Status: DC

## 2018-01-04 MED ORDER — ONDANSETRON HCL 4 MG/2ML IJ SOLN: 4.0000 mg | INTRAMUSCULAR | Status: DC | PRN

## 2018-01-04 MED ORDER — BISACODYL 10 MG RE SUPP: 10.0000 mg | Freq: Every day | RECTAL | Status: DC | PRN

## 2018-01-04 MED ORDER — FLUMAZENIL 0.5 MG/5ML IV SOLN
INTRAVENOUS | Status: AC
  Filled 2018-01-04: qty 5

## 2018-01-04 MED ORDER — ESMOLOL HCL 100 MG/10ML IV SOLN
INTRAVENOUS | Status: DC | PRN
Start: 2018-01-04 — End: 2018-01-04
  Administered 2018-01-04 (×2): 50 mg via INTRAVENOUS

## 2018-01-04 MED ORDER — LEVETIRACETAM IN NACL 500 MG/100ML IV SOLN
500.0000 mg | Freq: Two times a day (BID) | INTRAVENOUS | Status: DC
  Administered 2018-01-04 – 2018-01-05 (×2): 500 mg via INTRAVENOUS
  Filled 2018-01-04 (×2): qty 100

## 2018-01-04 MED ORDER — ACETAMINOPHEN 325 MG PO TABS
650.0000 mg | ORAL_TABLET | ORAL | Status: DC | PRN
Start: 2018-01-04 — End: 2018-01-06

## 2018-01-04 MED ORDER — HYDROMORPHONE HCL 1 MG/ML IJ SOLN
INTRAMUSCULAR | Status: AC
  Filled 2018-01-04: qty 1

## 2018-01-04 SURGICAL SUPPLY — 59 items
BLADE CLIPPER SURG (BLADE) ×2 IMPLANT
BUR ACORN 6.0 PRECISION (BURR) ×1 IMPLANT
BUR ACORN 6.0MM PRECISION (BURR) ×1
BUR ROUND FLUTED 4 SOFT TCH (BURR) ×1 IMPLANT
BUR ROUND FLUTED 4MM SOFT TCH (BURR) ×1
BUR SPIRAL ROUTER 2.3 (BUR) ×1 IMPLANT
BUR SPIRAL ROUTER 2.3MM (BUR) ×1
CANISTER SUCT 3000ML PPV (MISCELLANEOUS) ×4 IMPLANT
CARTRIDGE OIL MAESTRO DRILL (MISCELLANEOUS) ×4 IMPLANT
COVER MAYO STAND STRL (DRAPES) ×4 IMPLANT
DIFFUSER DRILL AIR PNEUMATIC (MISCELLANEOUS) ×6 IMPLANT
DRAPE POUCH INSTRU U-SHP 10X18 (DRAPES) ×4 IMPLANT
DRESSING TELFA 8X10 (GAUZE/BANDAGES/DRESSINGS) ×2 IMPLANT
DRSG TEGADERM 4X4.75 (GAUZE/BANDAGES/DRESSINGS) ×2 IMPLANT
DURAMATRIX ONLAY 3X3 (Plate) ×2 IMPLANT
DURAPREP 26ML APPLICATOR (WOUND CARE) ×2 IMPLANT
ELECT REM PT RETURN 9FT ADLT (ELECTROSURGICAL) ×4
ELECTRODE REM PT RTRN 9FT ADLT (ELECTROSURGICAL) ×2 IMPLANT
FORCEPS BIPOLAR SPETZLER 8 1.0 (NEUROSURGERY SUPPLIES) ×2 IMPLANT
GAUZE SPONGE 4X4 16PLY XRAY LF (GAUZE/BANDAGES/DRESSINGS) ×4 IMPLANT
GLOVE BIO SURGEON STRL SZ7 (GLOVE) ×2 IMPLANT
GLOVE BIO SURGEON STRL SZ8 (GLOVE) ×6 IMPLANT
GLOVE BIOGEL PI IND STRL 7.5 (GLOVE) IMPLANT
GLOVE BIOGEL PI IND STRL 8 (GLOVE) ×2 IMPLANT
GLOVE BIOGEL PI IND STRL 8.5 (GLOVE) ×2 IMPLANT
GLOVE BIOGEL PI INDICATOR 7.5 (GLOVE) ×6
GLOVE BIOGEL PI INDICATOR 8 (GLOVE) ×4
GLOVE BIOGEL PI INDICATOR 8.5 (GLOVE) ×4
GLOVE ECLIPSE 8.0 STRL XLNG CF (GLOVE) ×6 IMPLANT
GOWN STRL REUS W/ TWL LRG LVL3 (GOWN DISPOSABLE) IMPLANT
GOWN STRL REUS W/ TWL XL LVL3 (GOWN DISPOSABLE) IMPLANT
GOWN STRL REUS W/TWL 2XL LVL3 (GOWN DISPOSABLE) ×2 IMPLANT
GOWN STRL REUS W/TWL LRG LVL3 (GOWN DISPOSABLE) ×8
GOWN STRL REUS W/TWL XL LVL3 (GOWN DISPOSABLE) ×4
HEMOSTAT POWDER KIT SURGIFOAM (HEMOSTASIS) ×2 IMPLANT
KIT BASIN OR (CUSTOM PROCEDURE TRAY) ×4 IMPLANT
KIT ROOM TURNOVER OR (KITS) ×4 IMPLANT
KNIFE ARACHNOID DISP AM-24-S (MISCELLANEOUS) ×2 IMPLANT
MARKER SPHERE PSV REFLC 13MM (MARKER) ×8 IMPLANT
NDL HYPO 25X1 1.5 SAFETY (NEEDLE) ×2 IMPLANT
NEEDLE HYPO 25X1 1.5 SAFETY (NEEDLE) ×4 IMPLANT
NS IRRIG 1000ML POUR BTL (IV SOLUTION) ×4 IMPLANT
OIL CARTRIDGE MAESTRO DRILL (MISCELLANEOUS) ×4
PACK CRANIOTOMY CUSTOM (CUSTOM PROCEDURE TRAY) ×2 IMPLANT
PAD ARMBOARD 7.5X6 YLW CONV (MISCELLANEOUS) ×12 IMPLANT
PATTIES SURGICAL .5 X.5 (GAUZE/BANDAGES/DRESSINGS) ×4 IMPLANT
PENCIL BUTTON HOLSTER BLD 10FT (ELECTRODE) ×4 IMPLANT
PLATE 1.5  2HOLE LNG NEURO (Plate) ×6 IMPLANT
PLATE 1.5 2HOLE LNG NEURO (Plate) IMPLANT
SCREW SELF DRILL HT 1.5/4MM (Screw) ×12 IMPLANT
SPONGE SURGIFOAM ABS GEL SZ50 (HEMOSTASIS) ×2 IMPLANT
STAPLER SKIN PROX WIDE 3.9 (STAPLE) ×4 IMPLANT
SUT NURALON 4 0 TR CR/8 (SUTURE) ×4 IMPLANT
SUT VIC AB 2-0 CP2 18 (SUTURE) ×4 IMPLANT
TOWEL GREEN STERILE (TOWEL DISPOSABLE) ×4 IMPLANT
TOWEL GREEN STERILE FF (TOWEL DISPOSABLE) ×4 IMPLANT
TUBE CONNECTING 12'X1/4 (SUCTIONS) ×1
TUBE CONNECTING 12X1/4 (SUCTIONS) ×3 IMPLANT
WATER STERILE IRR 1000ML POUR (IV SOLUTION) ×4 IMPLANT

## 2018-01-04 NOTE — Brief Op Note (Signed)
01/04/2018  7:20 PM  PATIENT:  Tara [name]  67 y.o. female  PRE-OPERATIVE DIAGNOSIS:  brain Mass  POST-OPERATIVE DIAGNOSIS:  brain Mass  PROCEDURE:  Procedure(s) with comments: Left Pterional craniotomy for biopsy with [facility] (Left) - Left Pterional craniotomy for biopsy with [facility] APPLICATION OF CRANIAL NAVIGATION (N/A) with microdissection  SURGEON:  Surgeon(s) and Role:    Erline Levine, MD - Primary    * Consuella Lose, MD - Assisting  PHYSICIAN ASSISTANT:   ASSISTANTS: Poteat, RN   ANESTHESIA:   general  EBL:  75 mL   BLOOD ADMINISTERED:none  DRAINS: none   LOCAL MEDICATIONS USED:  MARCAINE    and LIDOCAINE   SPECIMEN:  Biopsy / Limited Resection  DISPOSITION OF SPECIMEN:  PATHOLOGY  COUNTS:  YES  TOURNIQUET:  * No tourniquets in log *  DICTATION: Patient is 67 year old woman with newly diagnosed brain tumor. She presented with hemiparesis and aphasia.  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  She had preop Brain[facility] MRI  for use of Curve for surgical localization of tumor.  Procedure:  Following smooth intubation, patient was placed in supine position with bump under left shoulder.  Head was placed in pins and left pterional scalp was shaved and prepped and draped in usual sterile fashion after Curve MRI was localized to map tumor location.  Area of planned incision was infiltrated with lidocaine. A pterional incision was made and carried through temporalis fascia and muscle to expose calvarium.  Skull flap was elevated exposing the dura.  The sphenoid wing was taken down with the high speed drill.  Bone bleeding was controlled with bone wax.  Ferne Reus was opened.  Using microdissection technique, the Sylvian fissure was split from ICA to MCA and ACA bifurcations. .  The Curve and microscope were used to confirm location.  Initially, very abnormal appearing brain along MCA was biopsies (emporal and  frontal biopsies were taken).  These were sent to Pathology for frozen section.  .  Hemostasis was assured with irrigation and Surgifoam.  The initial pathology was felt to be consistent with edematous brain, but not clearly neoplastic.  We then dissected further along M2 branch and took additional brain biopsies along temporal brain, which corresponded to the enhancing material on navigation scan.  We sent this material for permanent section, since at this point, we had biopsied precisely the area we had wanted to sample and if this also came back non-diagnostic, we did not feel that we would take additional biopsies from an additional location.  Hemostasis was assured. The dura was a Dura Matrix onlay graft. The bone flap was replaced with plates, the fascia and galea were closed with 2-0 vicryl sutures and the skin was re approximated with staples.  A sterile occlusive dressing was placed.  Patient was returned to a supine position and taken out of head pins, then extubated in the operating room, having tolerated surgery well.  Counts were correct at the end of the case.  PLAN OF CARE: Admit to inpatient   PATIENT DISPOSITION:  PACU - hemodynamically stable.   Delay start of Pharmacological VTE agent (>24hrs) due to surgical blood loss or risk of bleeding: yes

## 2018-01-04 NOTE — Progress Notes (Signed)
[facility]                                                                              Patient Demographics  Tara [name], is a 67 y.o. female, DOB - 06-Apr-1951, CWC:[id number]  Admit date - 12/31/2017   Admitting Physician Tara Gravel, MD  Outpatient Primary MD for the patient is Patient, No Pcp Per  Outpatient specialists:   LOS - 4  days   Medical records reviewed and are as summarized below:    Chief Complaint  Patient presents with  . Altered Mental Status       Brief summary   Patient is a 67 year old female with history of hypertension, presented to ED with confusion, difficulty finishing sentences since the night before the admission. She also complained of slight left frontal headache. Denied any fevers, chills or reported. CT showed large amount of white matter edema in the left basal ganglia, left temporal lobe concerning for underlying neoplasm, 9 mm of left-to-right midline shift. Patient was admitted for further workup.    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 Mickeal [name] -  continue IV Keppra, IV Decadron. EEG showed no epileptiform discharges. - Plan for craniotomy and biopsy today.   Active Problems:   Headache, acute metabolic encephalopathy/right-sided hemiparesis: - Due to #1, Right-sided weakness improved, continue IV Decadron    Hypertension - BP stable, continue amlodipine 10 mg daily, IV hydralazine as  needed with parameters     Hyperglycemia - CBG stable, hemoglobin A1c 5.9.  - Hyperglycemia likely due to steroids, continue sliding scale insulin   Code Status: Full code DVT Prophylaxis:  Lovenox on hold Family Communication: Discussed in detail with the patient, all imaging results, lab results explained to the patient, son and another family member in the room  Disposition Plan: Craniotomy and biopsy today  Time Spent in minutes  25 minutes   Procedures:  MRI brain   Consultants:   Neuro surgery  Neurology CCM Neuro-oncology : Tara [name]   Antimicrobials:   Cefazolin perioperatively   Medications  Scheduled Meds: . amLODipine  10 mg Oral Daily  . dexamethasone  6 mg Intravenous Q6H  . insulin aspart  0-9 Units Subcutaneous TID WC  . mouth rinse  15 mL Mouth Rinse q12n4p  . pantoprazole (PROTONIX) IV  40 mg Intravenous Q24H   Continuous Infusions: . 0.9 % NaCl with KCl 20 mEq / L 60 mL/hr at 01/04/18 1000  .  ceFAZolin (ANCEF) IV    . levETIRAcetam Stopped (01/04/18 0000)   PRN Meds:.hydrALAZINE, ondansetron (ZOFRAN) IV   Antibiotics   Anti-infectives (From admission, onward)   Start  Dose/Rate Route Frequency Ordered Stop   01/04/18 1555  ceFAZolin (ANCEF) IVPB 2g/100 mL premix     2 g 200 mL/hr over 30 Minutes Intravenous To [facility] 01/04/18 0817 01/05/18 1600        Subjective:   Tara [name] was seen and examined today. Sitting up in the chair, no complaints, family members at bedside. No repeat seizures. Right-sided weakness improving. Patient denies dizziness, chest pain, shortness of breath, abdominal pain, N/V/D/C.  Objective:   Vitals:   01/04/18 0800 01/04/18 0900 01/04/18 0939 01/04/18 1000  BP: (!) 130/112  (!) 129/91 (!) 142/87  Pulse: 64 61  (!) 37  Resp: 17 14  15   Temp:      TempSrc:      SpO2: 96% 98%  (!) 85%  Weight:      Height:        Intake/Output Summary (Last 24 hours) at 01/04/2018 1054 Last data  filed at 01/04/2018 1000 Gross per 24 hour  Intake 1606.5 ml  Output -  Net 1606.5 ml     Wt Readings from Last 3 Encounters:  01/04/18 98.3 kg (216 lb 11.4 oz)     Exam    General: Alert and oriented x 3, NAD  Eyes:   HEENT:    Cardiovascular: S1 S2 clear. RRR No pedal edema b/l  Respiratory: CTAB  Gastrointestinal: Soft, nontender, nondistended, + bowel sounds  Ext: no pedal edema bilaterally  Neuro: no new deficits  Musculoskeletal: No digital cyanosis, clubbing  Skin: No rashes  Psych: Normal affect and demeanor, alert and oriented x3    Data Reviewed:  I have personally reviewed following labs and imaging studies  Micro Results Recent Results (from the past 240 hour(s))  Urine Culture     Status: Abnormal   Collection Time: 12/31/17  1:45 AM  Result Value Ref Range Status   Specimen Description URINE, RANDOM  Final   Special Requests   Final    NONE Performed at [facility], [address] [place], [place] [address]    Culture MULTIPLE SPECIES PRESENT, SUGGEST RECOLLECTION (A)  Final   Report Status 01/02/2018 FINAL  Final    Radiology Reports Ct Head Wo Contrast  Result Date: 01/01/2018 CLINICAL DATA:  Slurred speech and right-sided weakness. EXAM: CT HEAD WITHOUT CONTRAST TECHNIQUE: Contiguous axial images were obtained from the base of the skull through the vertex without intravenous contrast. COMPARISON:  Head CT and brain MRI dated 12/31/2017 FINDINGS: Brain: The masslike area of heterogeneous attenuation, primarily hypoattenuation, surrounding the central left middle cerebral artery and extending from the left temporal lobe through the base a ganglia to the left centrum semiovale, is without change from the previous day's exams. There is still mild midline shift to the right currently measuring 4 mm. There are no new areas of abnormal parenchymal attenuation and no new areas of mass effect. No intracranial hemorrhage. Vascular: No hyperdense  vessel or unexpected calcification. Skull: Normal. Negative for fracture or focal lesion. Sinuses/Orbits: Globes and orbits are unremarkable. Sinuses and mastoid air cells are clear. Other: None. IMPRESSION: 1. No significant change from the previous day's exams. 2. Masslike area of abnormal attenuation, primarily hypoattenuation, with associated mass effect, on the left, centered on the left basal ganglia and surrounding the left middle cerebral artery, is unchanged in extent and degree of mass effect. 3. No new abnormalities.  No intracranial hemorrhage. Electronically Signed   By: Lajean Manes M.D.   On: 01/01/2018 11:35  Ct Head Wo Contrast  Result Date: 12/31/2017 CLINICAL DATA:  Altered level of consciousness. EXAM: CT HEAD WITHOUT CONTRAST TECHNIQUE: Contiguous axial images were obtained from the base of the skull through the vertex without intravenous contrast. COMPARISON:  None. FINDINGS: Brain: Large amount of white matter edema is seen in the left basal ganglia and temporal lobe. This is concerning for underlying neoplasm. This results in 9 mm of left-to-right midline shift. No ventricular dilatation is noted. No definite hemorrhage is noted. Vascular: No hyperdense vessel or unexpected calcification. Skull: Normal. Negative for fracture or focal lesion. Sinuses/Orbits: No acute finding. Other: None. IMPRESSION: Large amount of white matter edema is seen in the left basal ganglia and left temporal lobe concerning for underlying neoplasm. This results in 9 mm of left-to-right midline shift. Further evaluation with MRI with and without gadolinium administration is recommended. Electronically Signed   By: Marijo Conception, M.D.   On: 12/31/2017 21:32   Ct Chest W Contrast  Result Date: 01/01/2018 CLINICAL DATA:  Findings worrisome for CNS lymphoma versus glioma brain MRI. For staging. EXAM: CT CHEST, ABDOMEN, AND PELVIS WITH CONTRAST TECHNIQUE: Multidetector CT imaging of the chest, abdomen and pelvis  was performed following the standard protocol during bolus administration of intravenous contrast. CONTRAST:  152mL ISOVUE-300 IOPAMIDOL (ISOVUE-300) INJECTION 61% COMPARISON:  None. FINDINGS: CT CHEST FINDINGS Cardiovascular: Heart is normal in size.  No pericardial effusion. No evidence of thoracic aortic aneurysm. Mediastinum/Nodes: No suspicious mediastinal, hilar, or axillary lymphadenopathy. Visualized thyroid is unremarkable. Lungs/Pleura: Mild dependent atelectasis in the bilateral lower lobes and posterior left upper lobe. No suspicious pulmonary nodules. No focal consolidation. No pleural effusion or pneumothorax. Musculoskeletal: Visualized osseous structures are within normal limits. CT ABDOMEN PELVIS FINDINGS Hepatobiliary: 13 mm cyst in segment 5 of the liver (series 3/image 56). Layering gallstone (series 3/image 34). No associated inflammatory changes. No intrahepatic or extrahepatic ductal dilatation. Pancreas: Within normal limits. Spleen: Spleen is normal in size. Adrenals/Urinary Tract: Adrenal glands are within normal limits. 3 mm nonobstructing left lower pole renal calculus (series 3/image 70). Multiple left renal cysts, measuring up to 5.1 cm in the posterior left lower kidney (series 3/image 69), measuring simple fluid density (Bosniak I). Right kidney is within normal limits. No hydronephrosis. Bladder is within normal limits. Stomach/Bowel: Stomach is notable for a tiny hiatal hernia. No evidence of bowel obstruction. Normal appendix (series 3/image 92). Mild sigmoid diverticulosis, without evidence of diverticulitis. Vascular/Lymphatic: No evidence of abdominal aortic aneurysm. Atherosclerotic calcifications of the abdominal aorta and branch vessels. No suspicious abdominopelvic lymphadenopathy. Reproductive: Uterus is within normal limits. Bilateral ovaries are within normal limits. Other: No abdominopelvic ascites. Musculoskeletal: Mild degenerative changes of the lumbar spine.  IMPRESSION: No findings suspicious for malignancy (primary or metastatic) in the chest, abdomen, or pelvis. No suspicious lymphadenopathy.  Spleen is normal in size. 3 mm nonobstructing left lower pole renal calculus. Multiple left renal cysts, benign (Bosniak I). Cholelithiasis, without associated inflammatory changes. Electronically Signed   By: Julian Hy M.D.   On: 01/01/2018 09:12   Mr Jeri Cos FV Contrast  Result Date: 01/03/2018 CLINICAL DATA:  Primary CNS lymphoma.  Surgical planning EXAM: MRI HEAD WITHOUT AND WITH CONTRAST TECHNIQUE: Multiplanar, multiecho pulse sequences of the brain and surrounding structures were obtained without and with intravenous contrast. CONTRAST:  20mL MULTIHANCE GADOBENATE DIMEGLUMINE 529 MG/ML IV SOLN COMPARISON:  MRI head 12/31/2017, CT head 12/31/2017 FINDINGS: Brain: Large area of edema surrounding the left sylvian fissure similar to the prior MRI.  This involves much of the temporal lobe as well as the left posterior frontal lobe, and the left frontal and operculum and left parietal operculum. The prior MRI demonstrated enhancement along the left middle cerebral artery in the sylvian fissure most compatible with tumor. Negative for hemorrhage. Contrast not administered today. Mild mass-effect on the left lateral ventricle with 4 mm midline shift to the right. 10 x 15 mm focus of restricted diffusion left periventricular white matter and basal ganglia is new since the prior study, consistent with acute infarction. Small hyperintensity in the right parietal white matter unchanged. 1 cm lesion in the left cerebellum hyperintense on T2 with facilitated diffusion also unchanged. Vascular: Normal arterial flow voids. Skull and upper cervical spine: Negative Sinuses/Orbits: Negative Other: None IMPRESSION: Large area of edema in the left cerebral white matter similar to the recent MRI. Contrast not administered today however abnormal enhancement along the left middle  cerebral artery in the sylvian fissure is seen previously, most likely tumor. 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 4 mm midline shift to the right unchanged. 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 Small hyperintensities left cerebellum and right periventricular white matter likely due to chronic ischemia. These are unchanged. Electronically Signed   By: Franchot Gallo M.D.   On: 01/03/2018 16:47   Mr Brain W And Wo Contrast  Result Date: 01/01/2018 CLINICAL DATA:  Confusion and mass demonstrated on earlier head CT EXAM: MRI HEAD WITHOUT AND WITH CONTRAST MRA HEAD WITHOUT CONTRAST TECHNIQUE: Multiplanar, multiecho pulse sequences of the brain and surrounding structures were obtained without and with intravenous contrast. Angiographic images of the head were obtained using MRA technique without contrast. CONTRAST:  65mL MULTIHANCE GADOBENATE DIMEGLUMINE 529 MG/ML IV SOLN COMPARISON:  Head CT 12/31/2017 FINDINGS: MRI HEAD FINDINGS Brain: The midline structures are normal. No focal diffusion restriction to indicate acute infarct. No intraparenchymal hemorrhage. Large amount of hyperintense T2-weighted signal in the left basal ganglia, left insula and left temporal lobe. There is an area of contrast enhancement within the brain parenchyma following the left middle cerebral artery M1 and proximal M2 segments. The largest confluent area of enhancement measures 2.2 x 1.3 cm. Rightward midline shift of 6 mm is unchanged. No chronic microhemorrhage or cerebral amyloid angiopathy. No hydrocephalus, age advanced atrophy or lobar predominant volume loss. No dural abnormality or extra-axial collection. Skull and upper cervical spine: The visualized skull base, calvarium, upper cervical spine and extracranial soft tissues are normal. Sinuses/Orbits: No fluid levels or advanced mucosal thickening. No mastoid effusion. Normal orbits. MRA HEAD FINDINGS Intracranial internal carotid arteries: Normal. Anterior  cerebral arteries: Normal. Middle cerebral arteries: Normal. Posterior communicating arteries: Present on the right. Posterior cerebral arteries: Normal. Fetal origin on the right. Basilar artery: Normal. Vertebral arteries: Left dominant. Normal. Superior cerebellar arteries: Normal. Anterior inferior cerebellar arteries: Normal. Posterior inferior cerebellar arteries: Normal. IMPRESSION: 1. Masslike parenchymal contrast enhancement adjacent to the left MCA M1 and proximal M2 segments with large area of surrounding hyperintense T2-weighted signal. The appearance is concerning for lymphoma -- including the intravascular variant -- with a large amount of surrounding edema. A primary CNS neoplasm, most likely a high grade glioma, is also a possibility, in which case the abnormal T2WI hyperintensity could indicate edema or non-enhancing tumor. Vasculitis is less likely in the context of normal MRA, but remains a consideration (MRA is less sensitive for detecting findings of vasculitis than CTA or conventional angiography). 2. Normal MRA. 3. Unchanged 6 mm rightward midline  shift. Electronically Signed   By: Ulyses Jarred M.D.   On: 01/01/2018 00:39   Ct Abdomen Pelvis W Contrast  Result Date: 01/01/2018 CLINICAL DATA:  Findings worrisome for CNS lymphoma versus glioma brain MRI. For staging. EXAM: CT CHEST, ABDOMEN, AND PELVIS WITH CONTRAST TECHNIQUE: Multidetector CT imaging of the chest, abdomen and pelvis was performed following the standard protocol during bolus administration of intravenous contrast. CONTRAST:  158mL ISOVUE-300 IOPAMIDOL (ISOVUE-300) INJECTION 61% COMPARISON:  None. FINDINGS: CT CHEST FINDINGS Cardiovascular: Heart is normal in size.  No pericardial effusion. No evidence of thoracic aortic aneurysm. Mediastinum/Nodes: No suspicious mediastinal, hilar, or axillary lymphadenopathy. Visualized thyroid is unremarkable. Lungs/Pleura: Mild dependent atelectasis in the bilateral lower lobes and  posterior left upper lobe. No suspicious pulmonary nodules. No focal consolidation. No pleural effusion or pneumothorax. Musculoskeletal: Visualized osseous structures are within normal limits. CT ABDOMEN PELVIS FINDINGS Hepatobiliary: 13 mm cyst in segment 5 of the liver (series 3/image 56). Layering gallstone (series 3/image 34). No associated inflammatory changes. No intrahepatic or extrahepatic ductal dilatation. Pancreas: Within normal limits. Spleen: Spleen is normal in size. Adrenals/Urinary Tract: Adrenal glands are within normal limits. 3 mm nonobstructing left lower pole renal calculus (series 3/image 70). Multiple left renal cysts, measuring up to 5.1 cm in the posterior left lower kidney (series 3/image 69), measuring simple fluid density (Bosniak I). Right kidney is within normal limits. No hydronephrosis. Bladder is within normal limits. Stomach/Bowel: Stomach is notable for a tiny hiatal hernia. No evidence of bowel obstruction. Normal appendix (series 3/image 92). Mild sigmoid diverticulosis, without evidence of diverticulitis. Vascular/Lymphatic: No evidence of abdominal aortic aneurysm. Atherosclerotic calcifications of the abdominal aorta and branch vessels. No suspicious abdominopelvic lymphadenopathy. Reproductive: Uterus is within normal limits. Bilateral ovaries are within normal limits. Other: No abdominopelvic ascites. Musculoskeletal: Mild degenerative changes of the lumbar spine. IMPRESSION: No findings suspicious for malignancy (primary or metastatic) in the chest, abdomen, or pelvis. No suspicious lymphadenopathy.  Spleen is normal in size. 3 mm nonobstructing left lower pole renal calculus. Multiple left renal cysts, benign (Bosniak I). Cholelithiasis, without associated inflammatory changes. Electronically Signed   By: Julian Hy M.D.   On: 01/01/2018 09:12   Mr Jodene Nam Head (cerebral Arteries)  Result Date: 01/01/2018 CLINICAL DATA:  Confusion and mass demonstrated on earlier  head CT EXAM: MRI HEAD WITHOUT AND WITH CONTRAST MRA HEAD WITHOUT CONTRAST TECHNIQUE: Multiplanar, multiecho pulse sequences of the brain and surrounding structures were obtained without and with intravenous contrast. Angiographic images of the head were obtained using MRA technique without contrast. CONTRAST:  49mL MULTIHANCE GADOBENATE DIMEGLUMINE 529 MG/ML IV SOLN COMPARISON:  Head CT 12/31/2017 FINDINGS: MRI HEAD FINDINGS Brain: The midline structures are normal. No focal diffusion restriction to indicate acute infarct. No intraparenchymal hemorrhage. Large amount of hyperintense T2-weighted signal in the left basal ganglia, left insula and left temporal lobe. There is an area of contrast enhancement within the brain parenchyma following the left middle cerebral artery M1 and proximal M2 segments. The largest confluent area of enhancement measures 2.2 x 1.3 cm. Rightward midline shift of 6 mm is unchanged. No chronic microhemorrhage or cerebral amyloid angiopathy. No hydrocephalus, age advanced atrophy or lobar predominant volume loss. No dural abnormality or extra-axial collection. Skull and upper cervical spine: The visualized skull base, calvarium, upper cervical spine and extracranial soft tissues are normal. Sinuses/Orbits: No fluid levels or advanced mucosal thickening. No mastoid effusion. Normal orbits. MRA HEAD FINDINGS Intracranial internal carotid arteries: Normal. Anterior cerebral arteries: Normal. Middle  cerebral arteries: Normal. Posterior communicating arteries: Present on the right. Posterior cerebral arteries: Normal. Fetal origin on the right. Basilar artery: Normal. Vertebral arteries: Left dominant. Normal. Superior cerebellar arteries: Normal. Anterior inferior cerebellar arteries: Normal. Posterior inferior cerebellar arteries: Normal. IMPRESSION: 1. Masslike parenchymal contrast enhancement adjacent to the left MCA M1 and proximal M2 segments with large area of surrounding hyperintense  T2-weighted signal. The appearance is concerning for lymphoma -- including the intravascular variant -- with a large amount of surrounding edema. A primary CNS neoplasm, most likely a high grade glioma, is also a possibility, in which case the abnormal T2WI hyperintensity could indicate edema or non-enhancing tumor. Vasculitis is less likely in the context of normal MRA, but remains a consideration (MRA is less sensitive for detecting findings of vasculitis than CTA or conventional angiography). 2. Normal MRA. 3. Unchanged 6 mm rightward midline shift. Electronically Signed   By: Ulyses Jarred M.D.   On: 01/01/2018 00:39    Lab Data:  CBC: Recent Labs  Lab 12/31/17 1638 12/31/17 2204 01/01/18 0209 01/02/18 0500 01/03/18 0354 01/04/18 0445  WBC 9.4  --  11.3* 15.2* 10.5 9.3  NEUTROABS 7.9*  --   --   --  9.6*  --   HGB 15.3* 13.9 14.0 15.4* 14.0 13.7  HCT 45.3 41.0 41.0 44.1 42.1 40.9  MCV 93.4  --  92.3 91.3 93.8 93.0  PLT 424*  --  378 421* 391 782   Basic Metabolic Panel: Recent Labs  Lab 12/31/17 1638 12/31/17 2204 01/01/18 0209 01/02/18 0500 01/03/18 0354 01/04/18 0445  NA 136 135 133* 136 140 138  K 3.6 4.5 3.8 3.4* 3.7 3.8  CL 101 103 101 105 108 107  CO2 24  --  21* 20* 22 23  GLUCOSE 115* 110* 147* 161* 149* 149*  BUN 17 24* 15 16 18 18   CREATININE 0.74 0.60 0.62 0.52 0.55 0.55  CALCIUM 9.2  --  9.3 9.3 8.8* 8.5*  MG  --   --   --  1.9 2.2  --    GFR: Estimated Creatinine Clearance: 81.8 mL/min (by C-G formula based on SCr of 0.55 mg/dL). Liver Function Tests: Recent Labs  Lab 12/31/17 1638 01/01/18 0209 01/03/18 0354  AST 15 15 19   ALT 14 17 21   ALKPHOS 118 113 88  BILITOT 0.7 0.6 0.7  PROT 7.2 6.5 6.2*  ALBUMIN 4.1 3.7 3.4*   No results for input(s): LIPASE, AMYLASE in the last 168 hours. No results for input(s): AMMONIA in the last 168 hours. Coagulation Profile: Recent Labs  Lab 12/31/17 2156  INR 1.19   Cardiac Enzymes: No results for  input(s): CKTOTAL, CKMB, CKMBINDEX, TROPONINI in the last 168 hours. BNP (last 3 results) No results for input(s): PROBNP in the last 8760 hours. HbA1C: Recent Labs    01/01/18 1617  HGBA1C 5.9*   CBG: Recent Labs  Lab 01/03/18 0745 01/03/18 1125 01/03/18 1655 01/03/18 2343 01/04/18 0757  GLUCAP 141* 121* 142* 142* 128*   Lipid Profile: No results for input(s): CHOL, HDL, LDLCALC, TRIG, CHOLHDL, LDLDIRECT in the last 72 hours. Thyroid Function Tests: No results for input(s): TSH, T4TOTAL, FREET4, T3FREE, THYROIDAB in the last 72 hours. Anemia Panel: No results for input(s): VITAMINB12, FOLATE, FERRITIN, TIBC, IRON, RETICCTPCT in the last 72 hours. Urine analysis:    Component Value Date/Time   COLORURINE YELLOW 01/01/2018 0152   APPEARANCEUR HAZY (A) 01/01/2018 0152   LABSPEC 1.024 01/01/2018 0152   PHURINE 5.0 01/01/2018  [place] 01/01/2018 0152   HGBUR LARGE (A) 01/01/2018 [place] 01/01/2018 [place] 01/01/2018 [place] 01/01/2018 0152   NITRITE NEGATIVE 01/01/2018 0152   LEUKOCYTESUR MODERATE (A) 01/01/2018 0152     Josean Lycan M.D. [facility] 01/04/2018, 10:54 AM  Pager: [phone] Between 7am to 7pm - call Pager - [phone]  After 7pm go to [email or web address] - password [email or web address]  Call night coverage person covering after 7pm

## 2018-01-04 NOTE — Transfer of Care (Signed)
Immediate Anesthesia Transfer of Care Note  Patient: Tara [name]  Procedure(s) Performed: Left Pterional craniotomy for biopsy with Brainlab (Left Head) APPLICATION OF CRANIAL NAVIGATION (N/A )  Patient Location: PACU  Anesthesia Type:General  Level of Consciousness: drowsy and pateint uncooperative  Airway & Oxygen Therapy: Patient Spontanous Breathing and Patient connected to nasal cannula oxygen  Post-op Assessment: Report given to RN and Post -op Vital signs reviewed and stable  Post vital signs: Reviewed and stable  Last Vitals:  Vitals:   01/04/18 1400 01/04/18 1930  BP: (!) 158/75 (!) (P) 164/83  Pulse: 61 (P) 79  Resp: 18 (P) 12  Temp:  (!) (P) 36.2 C  SpO2: 97% (P) 100%    Last Pain:  Vitals:   01/04/18 1200  TempSrc: Oral         Complications: No apparent anesthesia complications   Drowsy. Answers questions appropriately.  Pulling at IV. Restless.  Denies SOB or pain.  Report to RN at bedside.  VSS

## 2018-01-04 NOTE — Anesthesia Procedure Notes (Signed)
Procedure Name: Intubation Date/Time: 01/04/2018 4:05 PM Performed by: Oletta Lamas, CRNA Pre-anesthesia Checklist: Patient identified, Emergency Drugs available, Suction available and Patient being monitored Patient Re-evaluated:Patient Re-evaluated prior to induction Oxygen Delivery Method: Circle System Utilized Preoxygenation: Pre-oxygenation with 100% oxygen Induction Type: IV induction Ventilation: Mask ventilation without difficulty Laryngoscope Size: Mac and 3 Grade View: Grade I Tube type: Oral Number of attempts: 1 Airway Equipment and Method: Stylet and Oral airway Placement Confirmation: ETT inserted through vocal cords under direct vision,  positive ETCO2 and breath sounds checked- equal and bilateral Secured at: 22 cm Tube secured with: Tape Dental Injury: Teeth and Oropharynx as per pre-operative assessment

## 2018-01-04 NOTE — Progress Notes (Signed)
Awake, alert, conversant.  A bit confused and irritated by Foley.  MAEW.  No apparent deficits.

## 2018-01-04 NOTE — Consult Note (Signed)
PULMONARY / CRITICAL CARE MEDICINE   Name: Tara [name] MRN: [id number] DOB: 14-Aug-1951    ADMISSION DATE:  12/31/2017 CONSULTATION DATE:  01/01/2018 REFERRING MD:  Tana Coast, Ripudeep  CHIEF COMPLAINT: Left brain mass and seizures  HISTORY OF PRESENT ILLNESS:   This is a 67 year old who reports no significant past medical history until the past 2 days when she was having difficulties with speech and intermittent difficulties using her right side.  She presented to the emergency room on 3/1 and a CT scan of the head was obtained which showed an ill-defined mass with lots of surrounding edema centered in the left MCA territory.  There was a 4 mm left to right shift.  She denies any known prior history of cancer adenopathy or weight loss.  CTs of the chest abdomen and pelvis have not suggested a primary and she is HIV negative.  At admission afternoon she had overt seizure activity and was loaded with Keppra and transferred to the intensive care unit.  PAST MEDICAL HISTORY :  She  has no past medical history on file.  PAST SURGICAL HISTORY: She  has no past surgical history on file.  No Known Allergies  No current facility-administered medications on file prior to encounter.    Current Outpatient Medications on File Prior to Encounter  Medication Sig  . aspirin-acetaminophen-caffeine (EXCEDRIN MIGRAINE) 250-250-65 MG tablet Take 1 tablet by mouth every 6 (six) hours as needed for headache or migraine.  . Multiple Vitamin (MULTIVITAMIN WITH MINERALS) TABS tablet Take 1 tablet by mouth daily.  Marland Kitchen OVER THE COUNTER MEDICATION Take by mouth See admin instructions. Over the counter diet pills (Keto etc)    FAMILY HISTORY:  Her indicated that her mother is deceased. She indicated that her father is deceased.   SOCIAL HISTORY: She  reports that she has been smoking cigarettes.  She has been smoking about 1.00 pack per day. she has never used smokeless tobacco. She reports that she does not drink  alcohol or use drugs.  REVIEW OF SYSTEMS:   Activity is somewhat limited by a pain in her right hip.  She has no prior known history of seizure or CVA.  She has no known history of respiratory illness no unusual dyspnea, no known heart disease no known chest pain MIs arrhythmias or syncopal episodes in the past.  She denies a history of GI disease no internal bleeding.  She denies diabetes or thyroid disease.  SUBJECTIVE:  As above  VITAL SIGNS: BP (!) 163/98 Comment: prn given  Pulse 61   Temp 97.6 F (36.4 C) (Axillary)   Resp 13   Ht 5\' 6"  (1.676 m)   Wt 216 lb 11.4 oz (98.3 kg)   SpO2 97%   BMI 34.98 kg/m   HEMODYNAMICS:      INTAKE / OUTPUT: I/O last 3 completed shifts: In: 2306.5 [I.V.:1999; IV Piggyback:307.5] Out: -   PHYSICAL EXAMINATION: General: Middle-aged somewhat obese female in no overt distress Neuro: She is entirely alert and oriented x3. Sitting in chair. Cardiovascular: regular rate/rhythm.  Lungs: Respirations are unlabored Abdomen: The abdomen is obese and soft  Musculoskeletal: No deformity.   LABS:  BMET Recent Labs  Lab 01/02/18 0500 01/03/18 0354 01/04/18 0445  NA 136 140 138  K 3.4* 3.7 3.8  CL 105 108 107  CO2 20* 22 23  BUN 16 18 18   CREATININE 0.52 0.55 0.55  GLUCOSE 161* 149* 149*    Electrolytes Recent Labs  Lab 01/02/18 0500  01/03/18 0354 01/04/18 0445  CALCIUM 9.3 8.8* 8.5*  MG 1.9 2.2  --     CBC Recent Labs  Lab 01/02/18 0500 01/03/18 0354 01/04/18 0445  WBC 15.2* 10.5 9.3  HGB 15.4* 14.0 13.7  HCT 44.1 42.1 40.9  PLT 421* 391 374    Coag's Recent Labs  Lab 12/31/17 2156  APTT 40*  INR 1.19    Sepsis Markers No results for input(s): LATICACIDVEN, PROCALCITON, O2SATVEN in the last 168 hours.  ABG No results for input(s): PHART, PCO2ART, PO2ART in the last 168 hours.  Liver Enzymes Recent Labs  Lab 12/31/17 1638 01/01/18 0209 01/03/18 0354  AST 15 15 19   ALT 14 17 21   ALKPHOS 118 113  88  BILITOT 0.7 0.6 0.7  ALBUMIN 4.1 3.7 3.4*    Cardiac Enzymes No results for input(s): TROPONINI, PROBNP in the last 168 hours.  Glucose Recent Labs  Lab 01/02/18 1603 01/02/18 2145 01/03/18 0745 01/03/18 1125 01/03/18 1655 01/03/18 2343  GLUCAP 143* 135* 141* 121* 142* 142*    Imaging Mr Brain W Wo Contrast  Result Date: 01/03/2018 CLINICAL DATA:  Primary CNS lymphoma.  Surgical planning EXAM: MRI HEAD WITHOUT AND WITH CONTRAST TECHNIQUE: Multiplanar, multiecho pulse sequences of the brain and surrounding structures were obtained without and with intravenous contrast. CONTRAST:  33mL MULTIHANCE GADOBENATE DIMEGLUMINE 529 MG/ML IV SOLN COMPARISON:  MRI head 12/31/2017, CT head 12/31/2017 FINDINGS: Brain: Large area of edema surrounding the left sylvian fissure similar to the prior MRI. This involves much of the temporal lobe as well as the left posterior frontal lobe, and the left frontal and operculum and left parietal operculum. The prior MRI demonstrated enhancement along the left middle cerebral artery in the sylvian fissure most compatible with tumor. Negative for hemorrhage. Contrast not administered today. Mild mass-effect on the left lateral ventricle with 4 mm midline shift to the right. 10 x 15 mm focus of restricted diffusion left periventricular white matter and basal ganglia is new since the prior study, consistent with acute infarction. Small hyperintensity in the right parietal white matter unchanged. 1 cm lesion in the left cerebellum hyperintense on T2 with facilitated diffusion also unchanged. Vascular: Normal arterial flow voids. Skull and upper cervical spine: Negative Sinuses/Orbits: Negative Other: None IMPRESSION: Large area of edema in the left cerebral white matter similar to the recent MRI. Contrast not administered today however abnormal enhancement along the left middle cerebral artery in the sylvian fissure is seen previously, most likely tumor. Lymphoma is  favored. 4 mm midline shift to the right unchanged. 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 Small hyperintensities left cerebellum and right periventricular white matter likely due to chronic ischemia. These are unchanged. Electronically Signed   By: Franchot Gallo M.D.   On: 01/03/2018 16:47    MRI/MRA Head 01/01/18 IMPRESSION: 1. Masslike parenchymal contrast enhancement adjacent to the left MCA M1 and proximal M2 segments with large area of surrounding hyperintense T2-weighted signal. The appearance is concerning for lymphoma -- including the intravascular variant -- with a large amount of surrounding edema. A primary CNS neoplasm, most likely a high grade glioma, is also a possibility, in which case the abnormal T2WI hyperintensity could indicate edema or non-enhancing tumor. Vasculitis is less likely in the context of normal MRA, but remains a consideration (MRA is less sensitive for detecting findings of vasculitis than CTA or conventional angiography). 2. Normal MRA. 3. Unchanged 6 mm rightward midline shift.  DISCUSSION: This is a 67 year old who presented with altered mental status speech difficulties and confusion.  She was found to have a large ill-defined mass with substantial edema and subsequently has had seizure activity.  She is transferred to the ICU to ensure that her seizures are controlled in anticipation of a stereotactic biopsy.  She was taking no medications prior to admission appears to have no concurrent significant illness.  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  She continues on Decadron for now she has been loaded for Keppra for intermittent seizures.   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 EEG without further seizure activity. Planning for biopsy w/ craniotomy today.   CV A: Hypertension. P: Norvasc and prn Hydralazine per Hospitalist   Endo:  A: Hyperglycemia, likely steroid induced P: SSI   DVT PPx: Enoxaparin GI PPx: Protonix   If stable post-op without further seizures and not requiring mechanical ventilation, PCCM will sign off. Otherwise we will continue to follow to assist with management of ventilator.   Dover, Lynden Oxford, MD  PCCM 01/04/18

## 2018-01-04 NOTE — Progress Notes (Addendum)
Subjective: Patient reports "I'm doing better every day"  Objective: Vital signs in last 24 hours: Temp:  [97.6 F (36.4 C)-98 F (36.7 C)] 97.6 F (36.4 C) (03/05 0400) Pulse Rate:  [42-130] 61 (03/05 0600) Resp:  [11-33] 13 (03/05 0600) BP: (121-171)/(69-145) 163/98 (03/05 0600) SpO2:  [87 %-100 %] 97 % (03/05 0600)  Intake/Output from previous day: 03/04 0701 - 03/05 0700 In: 1486.5 [I.V.:1279; IV Piggyback:207.5] Out: -  Intake/Output this shift: No intake/output data recorded.  Alert, conversant. Speech improved. Improved strength right arm and leg. Some right facial droop persists.   Lab Results: Recent Labs    01/03/18 0354 01/04/18 0445  WBC 10.5 9.3  HGB 14.0 13.7  HCT 42.1 40.9  PLT 391 374   BMET Recent Labs    01/03/18 0354 01/04/18 0445  NA 140 138  K 3.7 3.8  CL 108 107  CO2 22 23  GLUCOSE 149* 149*  BUN 18 18  CREATININE 0.55 0.55  CALCIUM 8.8* 8.5*    Studies/Results: Mr Jeri Cos QT Contrast  Result Date: 01/03/2018 CLINICAL DATA:  Primary CNS lymphoma.  Surgical planning EXAM: MRI HEAD WITHOUT AND WITH CONTRAST TECHNIQUE: Multiplanar, multiecho pulse sequences of the brain and surrounding structures were obtained without and with intravenous contrast. CONTRAST:  72mL MULTIHANCE GADOBENATE DIMEGLUMINE 529 MG/ML IV SOLN COMPARISON:  MRI head 12/31/2017, CT head 12/31/2017 FINDINGS: Brain: Large area of edema surrounding the left sylvian fissure similar to the prior MRI. This involves much of the temporal lobe as well as the left posterior frontal lobe, and the left frontal and operculum and left parietal operculum. The prior MRI demonstrated enhancement along the left middle cerebral artery in the sylvian fissure most compatible with tumor. Negative for hemorrhage. Contrast not administered today. Mild mass-effect on the left lateral ventricle with 4 mm midline shift to the right. 10 x 15 mm focus of restricted diffusion left periventricular white  matter and basal ganglia is new since the prior study, consistent with acute infarction. Small hyperintensity in the right parietal white matter unchanged. 1 cm lesion in the left cerebellum hyperintense on T2 with facilitated diffusion also unchanged. Vascular: Normal arterial flow voids. Skull and upper cervical spine: Negative Sinuses/Orbits: Negative Other: None IMPRESSION: Large area of edema in the left cerebral white matter similar to the recent MRI. Contrast not administered today however abnormal enhancement along the left middle cerebral artery in the sylvian fissure is seen previously, most likely tumor. 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 4 mm midline shift to the right unchanged. 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 Small hyperintensities left cerebellum and right periventricular white matter likely due to chronic ischemia. These are unchanged. Electronically Signed   By: Franchot Gallo M.D.   On: 01/03/2018 16:47    Assessment/Plan:   LOS: 4 days  Left pterional craniotomy for biopsy this afternoon. Pt verbalizes understanding of plan and wishes to proceed. Per DrStern, will stop Lovenox now.    Verdis Prime 01/04/2018, 7:41 AM

## 2018-01-04 NOTE — Anesthesia Preprocedure Evaluation (Addendum)
Anesthesia Evaluation  Patient identified by MRN, date of birth, ID band Patient awake    Reviewed: Allergy & Precautions, NPO status , Patient's Chart, lab work & pertinent test results  Airway Mallampati: III   Neck ROM: Full    Dental  (+) Dental Advisory Given, Teeth Intact   Pulmonary Current Smoker,    Pulmonary exam normal breath sounds clear to auscultation       Cardiovascular negative cardio ROS Normal cardiovascular exam Rhythm:Regular Rate:Normal  EKG - SR with prolonged QTc (588)   Neuro/Psych  Headaches, Seizures -,  CNS mass with right sided hemiparesis, confusion, facial drooping  '19 MRI brain - Large area of edema in the left cerebral white matter similar to the recent MRI. Contrast not administered today however abnormal enhancement along the left middle cerebral artery in the sylvian fissure is seen previously, most likely tumor. 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 4 mm midline shift to the right unchanged. 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 Small hyperintensities left cerebellum and right periventricular white matter likely due to chronic ischemia. These are unchanged.  negative psych ROS   GI/Hepatic negative GI ROS, Neg liver ROS,   Endo/Other  Obesity  Renal/GU negative Renal ROS  negative genitourinary   Musculoskeletal negative musculoskeletal ROS (+)   Abdominal (+) + obese,   Peds  Hematology negative hematology ROS (+)   Anesthesia Other Findings   Reproductive/Obstetrics                           Anesthesia Physical Anesthesia Plan  ASA: III  Anesthesia Plan: General   Post-op Pain Management:    Induction: Intravenous  PONV Risk Score and Plan: 3 and Treatment may vary due to age or medical condition, Ondansetron, Dexamethasone and Propofol infusion  Airway Management Planned: Oral ETT  Additional Equipment: Arterial  line  Intra-op Plan:   Post-operative Plan: Possible Post-op intubation/ventilation  Informed Consent: I have reviewed the patients History and Physical, chart, labs and discussed the procedure including the risks, benefits and alternatives for the proposed anesthesia with the patient or authorized representative who has indicated his/her understanding and acceptance.   Dental advisory given  Plan Discussed with: CRNA  Anesthesia Plan Comments: (2 large bore IV's)        Anesthesia Quick Evaluation

## 2018-01-04 NOTE — Anesthesia Procedure Notes (Signed)
Arterial Line Insertion Start/End3/03/2018 3:30 PM, 01/04/2018 3:40 PM Performed by: Audry Pili, MD, Harden Mo, CRNA, CRNA  Preanesthetic checklist: patient identified, IV checked, site marked, risks and benefits discussed, surgical consent, monitors and equipment checked, pre-op evaluation and timeout performed Lidocaine 1% used for infiltration radial was placed Catheter size: 20 G Hand hygiene performed , maximum sterile barriers used  and Seldinger technique used Allen's test indicative of satisfactory collateral circulation Attempts: 2 Procedure performed without using ultrasound guided technique. Following insertion, Biopatch. Post procedure assessment: normal  Patient tolerated the procedure well with no immediate complications.

## 2018-01-04 NOTE — Progress Notes (Signed)
OR staff came to get patient, taken down to OR via bed. Family at bedside and husband accompanied patient.

## 2018-01-04 NOTE — Interval H&P Note (Signed)
History and Physical Interval Note:  01/04/2018 3:16 PM  Tara [name]  has presented today for surgery, with the diagnosis of brain Mass  The various methods of treatment have been discussed with the patient and family. After consideration of risks, benefits and other options for treatment, the patient has consented to  Procedure(s) with comments: Left Pterional craniotomy for biopsy with Brainlab (Left) - Left Pterional craniotomy for biopsy with Brainlab APPLICATION OF CRANIAL NAVIGATION (N/A) as a surgical intervention .  The patient's history has been reviewed, patient examined, no change in status, stable for surgery.  I have reviewed the patient's chart and labs.  Questions were answered to the patient's satisfaction.     Maccoy Haubner D

## 2018-01-04 NOTE — Anesthesia Postprocedure Evaluation (Addendum)
Anesthesia Post Note  Patient: Tara [name]  Procedure(s) Performed: Left Pterional craniotomy for biopsy with Brainlab (Left Head) APPLICATION OF CRANIAL NAVIGATION (N/A )     Patient location during evaluation: PACU Anesthesia Type: General Level of consciousness: sedated and patient cooperative Pain management: pain level controlled Vital Signs Assessment: post-procedure vital signs reviewed and stable Respiratory status: spontaneous breathing Cardiovascular status: stable Anesthetic complications: no    Last Vitals:  Vitals:   01/04/18 2034 01/04/18 2045  BP:  (!) 163/97  Pulse:    Resp: 15 11  Temp:    SpO2:      Last Pain:  Vitals:   01/04/18 1200  TempSrc: Oral                 Nolon Nations

## 2018-01-04 NOTE — Progress Notes (Signed)
SLP Cancellation Note  Patient Details Name: Tara [name] MRN: [id number] DOB: 1951-04-10   Cancelled treatment:       Reason Eval/Treat Not Completed: Patient at procedure or test/unavailable   Pt in surgery for crani. Will follow up at next available date.    Jordon Bourquin 01/04/2018, 3:21 PM

## 2018-01-04 NOTE — Op Note (Signed)
01/04/2018  7:20 PM  PATIENT:  Tara [name]  67 y.o. female  PRE-OPERATIVE DIAGNOSIS:  brain Mass  POST-OPERATIVE DIAGNOSIS:  brain Mass  PROCEDURE:  Procedure(s) with comments: Left Pterional craniotomy for biopsy with Brainlab (Left) - Left Pterional craniotomy for biopsy with Brainlab APPLICATION OF CRANIAL NAVIGATION (N/A) with microdissection  SURGEON:  Surgeon(s) and Role:    Erline Levine, MD - Primary    * Consuella Lose, MD - Assisting  PHYSICIAN ASSISTANT:   ASSISTANTS: Poteat, RN   ANESTHESIA:   general  EBL:  75 mL   BLOOD ADMINISTERED:none  DRAINS: none   LOCAL MEDICATIONS USED:  MARCAINE    and LIDOCAINE   SPECIMEN:  Biopsy / Limited Resection  DISPOSITION OF SPECIMEN:  PATHOLOGY  COUNTS:  YES  TOURNIQUET:  * No tourniquets in log *  DICTATION: Patient is 67 year old woman with newly diagnosed brain tumor. She presented with hemiparesis and aphasia.  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  She had preop Brainlab MRI  for use of Curve for surgical localization of tumor.  Procedure:  Following smooth intubation, patient was placed in supine position with bump under left shoulder.  Head was placed in pins and left pterional scalp was shaved and prepped and draped in usual sterile fashion after Curve MRI was localized to map tumor location.  Area of planned incision was infiltrated with lidocaine. A pterional incision was made and carried through temporalis fascia and muscle to expose calvarium.  Skull flap was elevated exposing the dura.  The sphenoid wing was taken down with the high speed drill.  Bone bleeding was controlled with bone wax.  Tara [name] was opened.  Using microdissection technique, the Sylvian fissure was split from ICA to MCA and ACA bifurcations. .  The Curve and microscope were used to confirm location.  Initially, very abnormal appearing brain along MCA was biopsies (emporal and  frontal biopsies were taken).  These were sent to Pathology for frozen section.  .  Hemostasis was assured with irrigation and Surgifoam.  The initial pathology was felt to be consistent with edematous brain, but not clearly neoplastic.  We then dissected further along M2 branch and took additional brain biopsies along temporal brain, which corresponded to the enhancing material on navigation scan.  We sent this material for permanent section, since at this point, we had biopsied precisely the area we had wanted to sample and if this also came back non-diagnostic, we did not feel that we would take additional biopsies from an additional location.  Hemostasis was assured. The dura was a Dura Matrix onlay graft. The bone flap was replaced with plates, the fascia and galea were closed with 2-0 vicryl sutures and the skin was re approximated with staples.  A sterile occlusive dressing was placed.  Patient was returned to a supine position and taken out of head pins, then extubated in the operating room, having tolerated surgery well.  Counts were correct at the end of the case.  PLAN OF CARE: Admit to inpatient   PATIENT DISPOSITION:  PACU - hemodynamically stable.   Delay start of Pharmacological VTE agent (>24hrs) due to surgical blood loss or risk of bleeding: yes

## 2018-01-05 ENCOUNTER — Encounter (HOSPITAL_COMMUNITY): Payer: Self-pay | Admitting: Neurosurgery

## 2018-01-05 DIAGNOSIS — Z800 Family history of malignant neoplasm of digestive organs: Secondary | ICD-10-CM

## 2018-01-05 DIAGNOSIS — G939 Disorder of brain, unspecified: Secondary | ICD-10-CM

## 2018-01-05 DIAGNOSIS — F17210 Nicotine dependence, cigarettes, uncomplicated: Secondary | ICD-10-CM

## 2018-01-05 LAB — ANTINUCLEAR ANTIBODIES, IFA: ANA Ab, IFA: POSITIVE — AB

## 2018-01-05 LAB — GLUCOSE, CAPILLARY
Glucose-Capillary: 145 mg/dL — ABNORMAL HIGH (ref 65–99)
Glucose-Capillary: 149 mg/dL — ABNORMAL HIGH (ref 65–99)
Glucose-Capillary: 141 mg/dL — ABNORMAL HIGH (ref 65–99)
Glucose-Capillary: 120 mg/dL — ABNORMAL HIGH (ref 65–99)

## 2018-01-05 LAB — FANA STAINING PATTERNS: Homogeneous Pattern: 1:80 {titer}

## 2018-01-05 MED ORDER — LEVETIRACETAM 500 MG PO TABS
500.0000 mg | ORAL_TABLET | Freq: Two times a day (BID) | ORAL | Status: DC
  Administered 2018-01-05 – 2018-01-06 (×3): 500 mg via ORAL
  Filled 2018-01-05 (×3): qty 1

## 2018-01-05 MED FILL — Thrombin For Soln 20000 Unit: CUTANEOUS | Qty: 1 | Status: AC

## 2018-01-05 MED FILL — Thrombin For Soln 5000 Unit: CUTANEOUS | Qty: 5000 | Status: AC

## 2018-01-05 NOTE — Progress Notes (Signed)
[facility]                                                                              Patient Demographics  Tara [name], is a 67 y.o. female, DOB - November 12, 1950, AOZ:[id number]  Admit date - 12/31/2017   Admitting Physician Jani Gravel, MD  Outpatient Primary MD for the patient is Patient, No Pcp Per  Outpatient specialists:   LOS - 5  days   Medical records reviewed and are as summarized below:    Chief Complaint  Patient presents with  . Altered Mental Status       Brief summary   Patient is a 67 year old female with history of hypertension, presented to ED with confusion, difficulty finishing sentences since the night before the admission. She also complained of slight left frontal headache. Denied any fevers, chills or reported. CT showed large amount of white matter edema in the left basal ganglia, left temporal lobe concerning for underlying neoplasm, 9 mm of left-to-right midline shift. Patient was admitted for further workup.    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 EEG showed no epileptiform discharges - Neurosurgery was consulted, patient underwent craniotomy and brain biopsy on 01/04/2018, postop day #1 -Follow biopsy, discussed with neuro oncology Dr. Mickeal Skinner for recommendations  - Changed to oral Keppra 500 mg twice a day, start PT evaluation, transfer to neuro floor today  Active Problems:   Headache, acute metabolic  encephalopathy/right-sided hemiparesis: - Due to #1, Right-sided weakness improved, continue IV Decadron    Hypertension -  BP currently stable, continue amlodipine 10 mg daily  - IV hydralazine as needed     Hyperglycemia - CBG stable, hemoglobin A1c 5.9.  - Hyperglycemia likely due to steroids, continue sliding scale insulin   Code Status: Full code DVT Prophylaxis: SCDs  Family Communication: Discussed in detail with the patient, all imaging results, lab results explained to the patient,son, daughter and daughter-in-law in the room   Disposition Plan:Transfer out of ICU today, start physical therapy, possible DC home in a.m.  Time Spent in minutes  25 minutes   Procedures:  MRI brain  Craniotomy, biopsy 01/04/18  Consultants:   Neuro surgery  Neurology CCM Neuro-oncology : Dr Mickeal Skinner   Antimicrobials:   Cefazolin perioperatively   Medications  Scheduled Meds: . dexamethasone  6 mg Intravenous Q6H   Followed by  . [START ON 01/06/2018] dexamethasone  4 mg Intravenous Q6H   Followed by  . [START ON 01/07/2018] dexamethasone  4 mg Intravenous Q8H  . docusate sodium  100 mg Oral BID  . pantoprazole (PROTONIX) IV  40 mg Intravenous QHS   Continuous  Infusions: . 0.9 % NaCl with KCl 20 mEq / L Stopped (01/05/18 0931)  . levETIRAcetam Stopped (01/05/18 0931)   PRN Meds:.acetaminophen **OR** acetaminophen, bisacodyl, HYDROcodone-acetaminophen, labetalol, morphine injection, ondansetron **OR** ondansetron (ZOFRAN) IV, polyethylene glycol, promethazine, sodium phosphate   Antibiotics   Anti-infectives (From admission, onward)   Start     Dose/Rate Route Frequency Ordered Stop   01/04/18 2100  ceFAZolin (ANCEF) IVPB 2g/100 mL premix     2 g 200 mL/hr over 30 Minutes Intravenous Every 8 hours 01/04/18 2057 01/05/18 0512   01/04/18 1555  ceFAZolin (ANCEF) IVPB 2g/100 mL premix     2 g 200 mL/hr over 30 Minutes Intravenous To [facility] 01/04/18 0817 01/04/18 1625         Subjective:   Alveda Reasons was seen and examined today.Headache much better today, only sore at the biopsy site. Right-sided weakness improved. No repeat seizures.    Patient denies dizziness, chest pain, shortness of breath, abdominal pain, N/V/D/C.  Objective:   Vitals:   01/05/18 1149 01/05/18 1200 01/05/18 1300 01/05/18 1400  BP: 128/81 129/75 (!) 117/104 134/86  Pulse: 79 64 71 (!) 57  Resp: 14 11 18 12   Temp:  99.1 F (37.3 C)    TempSrc:  Oral    SpO2: 95% 96% 96% 95%  Weight:      Height:        Intake/Output Summary (Last 24 hours) at 01/05/2018 1428 Last data filed at 01/05/2018 1400 Gross per 24 hour  Intake 3165 ml  Output 2400 ml  Net 765 ml     Wt Readings from Last 3 Encounters:  01/04/18 98.3 kg (216 lb 11.4 oz)     Exam   General: Alert and oriented x 3, NAD  Eyes:  HEENT:  craniotomy scar, dressing intact  Cardiovascular: S1 S2 auscultated. Regular rate and rhythm. No pedal edema b/l  Respiratory: CTA B  Gastrointestinal: Soft, nontender, nondistended, + bowel sounds  Ext: no pedal edema bilaterally  Neuro: right-sided weakness much improved  Musculoskeletal : No digital cyanosis, clubbing  Skin: No rashes  Psych: Normal affect and demeanor, alert and oriented x3    Data Reviewed:  I have personally reviewed following labs and imaging studies  Micro Results Recent Results (from the past 240 hour(s))  Urine Culture     Status: Abnormal   Collection Time: 12/31/17  1:45 AM  Result Value Ref Range Status   Specimen Description URINE, RANDOM  Final   Special Requests   Final    NONE Performed at [facility], [place] [address], [place], [place] [address]    Culture MULTIPLE SPECIES PRESENT, SUGGEST RECOLLECTION (A)  Final   Report Status 01/02/2018 FINAL  Final    Radiology Reports Ct Head Wo Contrast  Result Date: 01/01/2018 CLINICAL DATA:  Slurred speech and right-sided weakness. EXAM: CT HEAD WITHOUT CONTRAST  TECHNIQUE: Contiguous axial images were obtained from the base of the skull through the vertex without intravenous contrast. COMPARISON:  Head CT and brain MRI dated 12/31/2017 FINDINGS: Brain: The masslike area of heterogeneous attenuation, primarily hypoattenuation, surrounding the central left middle cerebral artery and extending from the left temporal lobe through the base a ganglia to the left centrum semiovale, is without change from the previous day's exams. There is still mild midline shift to the right currently measuring 4 mm. There are no new areas of abnormal parenchymal attenuation and no new areas of mass effect. No intracranial hemorrhage. Vascular: No hyperdense  vessel or unexpected calcification. Skull: Normal. Negative for fracture or focal lesion. Sinuses/Orbits: Globes and orbits are unremarkable. Sinuses and mastoid air cells are clear. Other: None. IMPRESSION: 1. No significant change from the previous day's exams. 2. Masslike area of abnormal attenuation, primarily hypoattenuation, with associated mass effect, on the left, centered on the left basal ganglia and surrounding the left middle cerebral artery, is unchanged in extent and degree of mass effect. 3. No new abnormalities.  No intracranial hemorrhage. Electronically Signed   By: Lajean Manes M.D.   On: 01/01/2018 11:35   Ct Head Wo Contrast  Result Date: 12/31/2017 CLINICAL DATA:  Altered level of consciousness. EXAM: CT HEAD WITHOUT CONTRAST TECHNIQUE: Contiguous axial images were obtained from the base of the skull through the vertex without intravenous contrast. COMPARISON:  None. FINDINGS: Brain: Large amount of white matter edema is seen in the left basal ganglia and temporal lobe. This is concerning for underlying neoplasm. This results in 9 mm of left-to-right midline shift. No ventricular dilatation is noted. No definite hemorrhage is noted. Vascular: No hyperdense vessel or unexpected calcification. Skull: Normal. Negative  for fracture or focal lesion. Sinuses/Orbits: No acute finding. Other: None. IMPRESSION: Large amount of white matter edema is seen in the left basal ganglia and left temporal lobe concerning for underlying neoplasm. This results in 9 mm of left-to-right midline shift. Further evaluation with MRI with and without gadolinium administration is recommended. Electronically Signed   By: Marijo Conception, M.D.   On: 12/31/2017 21:32   Ct Chest W Contrast  Result Date: 01/01/2018 CLINICAL DATA:  Findings worrisome for CNS lymphoma versus glioma brain MRI. For staging. EXAM: CT CHEST, ABDOMEN, AND PELVIS WITH CONTRAST TECHNIQUE: Multidetector CT imaging of the chest, abdomen and pelvis was performed following the standard protocol during bolus administration of intravenous contrast. CONTRAST:  170mL ISOVUE-300 IOPAMIDOL (ISOVUE-300) INJECTION 61% COMPARISON:  None. FINDINGS: CT CHEST FINDINGS Cardiovascular: Heart is normal in size.  No pericardial effusion. No evidence of thoracic aortic aneurysm. Mediastinum/Nodes: No suspicious mediastinal, hilar, or axillary lymphadenopathy. Visualized thyroid is unremarkable. Lungs/Pleura: Mild dependent atelectasis in the bilateral lower lobes and posterior left upper lobe. No suspicious pulmonary nodules. No focal consolidation. No pleural effusion or pneumothorax. Musculoskeletal: Visualized osseous structures are within normal limits. CT ABDOMEN PELVIS FINDINGS Hepatobiliary: 13 mm cyst in segment 5 of the liver (series 3/image 56). Layering gallstone (series 3/image 34). No associated inflammatory changes. No intrahepatic or extrahepatic ductal dilatation. Pancreas: Within normal limits. Spleen: Spleen is normal in size. Adrenals/Urinary Tract: Adrenal glands are within normal limits. 3 mm nonobstructing left lower pole renal calculus (series 3/image 70). Multiple left renal cysts, measuring up to 5.1 cm in the posterior left lower kidney (series 3/image 69), measuring simple  fluid density (Bosniak I). Right kidney is within normal limits. No hydronephrosis. Bladder is within normal limits. Stomach/Bowel: Stomach is notable for a tiny hiatal hernia. No evidence of bowel obstruction. Normal appendix (series 3/image 92). Mild sigmoid diverticulosis, without evidence of diverticulitis. Vascular/Lymphatic: No evidence of abdominal aortic aneurysm. Atherosclerotic calcifications of the abdominal aorta and branch vessels. No suspicious abdominopelvic lymphadenopathy. Reproductive: Uterus is within normal limits. Bilateral ovaries are within normal limits. Other: No abdominopelvic ascites. Musculoskeletal: Mild degenerative changes of the lumbar spine. IMPRESSION: No findings suspicious for malignancy (primary or metastatic) in the chest, abdomen, or pelvis. No suspicious lymphadenopathy.  Spleen is normal in size. 3 mm nonobstructing left lower pole renal calculus. Multiple left renal cysts, benign (Bosniak I).  Cholelithiasis, without associated inflammatory changes. Electronically Signed   By: Julian Hy M.D.   On: 01/01/2018 09:12   Mr Jeri Cos JM Contrast  Result Date: 01/03/2018 CLINICAL DATA:  Primary CNS lymphoma.  Surgical planning EXAM: MRI HEAD WITHOUT AND WITH CONTRAST TECHNIQUE: Multiplanar, multiecho pulse sequences of the brain and surrounding structures were obtained without and with intravenous contrast. CONTRAST:  63mL MULTIHANCE GADOBENATE DIMEGLUMINE 529 MG/ML IV SOLN COMPARISON:  MRI head 12/31/2017, CT head 12/31/2017 FINDINGS: Brain: Large area of edema surrounding the left sylvian fissure similar to the prior MRI. This involves much of the temporal lobe as well as the left posterior frontal lobe, and the left frontal and operculum and left parietal operculum. The prior MRI demonstrated enhancement along the left middle cerebral artery in the sylvian fissure most compatible with tumor. Negative for hemorrhage. Contrast not administered today. Mild mass-effect on  the left lateral ventricle with 4 mm midline shift to the right. 10 x 15 mm focus of restricted diffusion left periventricular white matter and basal ganglia is new since the prior study, consistent with acute infarction. Small hyperintensity in the right parietal white matter unchanged. 1 cm lesion in the left cerebellum hyperintense on T2 with facilitated diffusion also unchanged. Vascular: Normal arterial flow voids. Skull and upper cervical spine: Negative Sinuses/Orbits: Negative Other: None IMPRESSION: Large area of edema in the left cerebral white matter similar to the recent MRI. Contrast not administered today however abnormal enhancement along the left middle cerebral artery in the sylvian fissure is seen previously, most likely tumor. 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 4 mm midline shift to the right unchanged. 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 Small hyperintensities left cerebellum and right periventricular white matter likely due to chronic ischemia. These are unchanged. Electronically Signed   By: Franchot Gallo M.D.   On: 01/03/2018 16:47   Mr Brain W And Wo Contrast  Result Date: 01/01/2018 CLINICAL DATA:  Confusion and mass demonstrated on earlier head CT EXAM: MRI HEAD WITHOUT AND WITH CONTRAST MRA HEAD WITHOUT CONTRAST TECHNIQUE: Multiplanar, multiecho pulse sequences of the brain and surrounding structures were obtained without and with intravenous contrast. Angiographic images of the head were obtained using MRA technique without contrast. CONTRAST:  53mL MULTIHANCE GADOBENATE DIMEGLUMINE 529 MG/ML IV SOLN COMPARISON:  Head CT 12/31/2017 FINDINGS: MRI HEAD FINDINGS Brain: The midline structures are normal. No focal diffusion restriction to indicate acute infarct. No intraparenchymal hemorrhage. Large amount of hyperintense T2-weighted signal in the left basal ganglia, left insula and left temporal lobe. There is an area of contrast enhancement within the brain  parenchyma following the left middle cerebral artery M1 and proximal M2 segments. The largest confluent area of enhancement measures 2.2 x 1.3 cm. Rightward midline shift of 6 mm is unchanged. No chronic microhemorrhage or cerebral amyloid angiopathy. No hydrocephalus, age advanced atrophy or lobar predominant volume loss. No dural abnormality or extra-axial collection. Skull and upper cervical spine: The visualized skull base, calvarium, upper cervical spine and extracranial soft tissues are normal. Sinuses/Orbits: No fluid levels or advanced mucosal thickening. No mastoid effusion. Normal orbits. MRA HEAD FINDINGS Intracranial internal carotid arteries: Normal. Anterior cerebral arteries: Normal. Middle cerebral arteries: Normal. Posterior communicating arteries: Present on the right. Posterior cerebral arteries: Normal. Fetal origin on the right. Basilar artery: Normal. Vertebral arteries: Left dominant. Normal. Superior cerebellar arteries: Normal. Anterior inferior cerebellar arteries: Normal. Posterior inferior cerebellar arteries: Normal. IMPRESSION: 1. Masslike parenchymal contrast enhancement adjacent to the  left MCA M1 and proximal M2 segments with large area of surrounding hyperintense T2-weighted signal. The appearance is concerning for lymphoma -- including the intravascular variant -- with a large amount of surrounding edema. A primary CNS neoplasm, most likely a high grade glioma, is also a possibility, in which case the abnormal T2WI hyperintensity could indicate edema or non-enhancing tumor. Vasculitis is less likely in the context of normal MRA, but remains a consideration (MRA is less sensitive for detecting findings of vasculitis than CTA or conventional angiography). 2. Normal MRA. 3. Unchanged 6 mm rightward midline shift. Electronically Signed   By: Ulyses Jarred M.D.   On: 01/01/2018 00:39   Ct Abdomen Pelvis W Contrast  Result Date: 01/01/2018 CLINICAL DATA:  Findings worrisome for CNS  lymphoma versus glioma brain MRI. For staging. EXAM: CT CHEST, ABDOMEN, AND PELVIS WITH CONTRAST TECHNIQUE: Multidetector CT imaging of the chest, abdomen and pelvis was performed following the standard protocol during bolus administration of intravenous contrast. CONTRAST:  168mL ISOVUE-300 IOPAMIDOL (ISOVUE-300) INJECTION 61% COMPARISON:  None. FINDINGS: CT CHEST FINDINGS Cardiovascular: Heart is normal in size.  No pericardial effusion. No evidence of thoracic aortic aneurysm. Mediastinum/Nodes: No suspicious mediastinal, hilar, or axillary lymphadenopathy. Visualized thyroid is unremarkable. Lungs/Pleura: Mild dependent atelectasis in the bilateral lower lobes and posterior left upper lobe. No suspicious pulmonary nodules. No focal consolidation. No pleural effusion or pneumothorax. Musculoskeletal: Visualized osseous structures are within normal limits. CT ABDOMEN PELVIS FINDINGS Hepatobiliary: 13 mm cyst in segment 5 of the liver (series 3/image 56). Layering gallstone (series 3/image 34). No associated inflammatory changes. No intrahepatic or extrahepatic ductal dilatation. Pancreas: Within normal limits. Spleen: Spleen is normal in size. Adrenals/Urinary Tract: Adrenal glands are within normal limits. 3 mm nonobstructing left lower pole renal calculus (series 3/image 70). Multiple left renal cysts, measuring up to 5.1 cm in the posterior left lower kidney (series 3/image 69), measuring simple fluid density (Bosniak I). Right kidney is within normal limits. No hydronephrosis. Bladder is within normal limits. Stomach/Bowel: Stomach is notable for a tiny hiatal hernia. No evidence of bowel obstruction. Normal appendix (series 3/image 92). Mild sigmoid diverticulosis, without evidence of diverticulitis. Vascular/Lymphatic: No evidence of abdominal aortic aneurysm. Atherosclerotic calcifications of the abdominal aorta and branch vessels. No suspicious abdominopelvic lymphadenopathy. Reproductive: Uterus is within  normal limits. Bilateral ovaries are within normal limits. Other: No abdominopelvic ascites. Musculoskeletal: Mild degenerative changes of the lumbar spine. IMPRESSION: No findings suspicious for malignancy (primary or metastatic) in the chest, abdomen, or pelvis. No suspicious lymphadenopathy.  Spleen is normal in size. 3 mm nonobstructing left lower pole renal calculus. Multiple left renal cysts, benign (Bosniak I). Cholelithiasis, without associated inflammatory changes. Electronically Signed   By: Julian Hy M.D.   On: 01/01/2018 09:12   Mr Jodene Nam Head (cerebral Arteries)  Result Date: 01/01/2018 CLINICAL DATA:  Confusion and mass demonstrated on earlier head CT EXAM: MRI HEAD WITHOUT AND WITH CONTRAST MRA HEAD WITHOUT CONTRAST TECHNIQUE: Multiplanar, multiecho pulse sequences of the brain and surrounding structures were obtained without and with intravenous contrast. Angiographic images of the head were obtained using MRA technique without contrast. CONTRAST:  25mL MULTIHANCE GADOBENATE DIMEGLUMINE 529 MG/ML IV SOLN COMPARISON:  Head CT 12/31/2017 FINDINGS: MRI HEAD FINDINGS Brain: The midline structures are normal. No focal diffusion restriction to indicate acute infarct. No intraparenchymal hemorrhage. Large amount of hyperintense T2-weighted signal in the left basal ganglia, left insula and left temporal lobe. There is an area of contrast enhancement within the brain parenchyma  following the left middle cerebral artery M1 and proximal M2 segments. The largest confluent area of enhancement measures 2.2 x 1.3 cm. Rightward midline shift of 6 mm is unchanged. No chronic microhemorrhage or cerebral amyloid angiopathy. No hydrocephalus, age advanced atrophy or lobar predominant volume loss. No dural abnormality or extra-axial collection. Skull and upper cervical spine: The visualized skull base, calvarium, upper cervical spine and extracranial soft tissues are normal. Sinuses/Orbits: No fluid levels or  advanced mucosal thickening. No mastoid effusion. Normal orbits. MRA HEAD FINDINGS Intracranial internal carotid arteries: Normal. Anterior cerebral arteries: Normal. Middle cerebral arteries: Normal. Posterior communicating arteries: Present on the right. Posterior cerebral arteries: Normal. Fetal origin on the right. Basilar artery: Normal. Vertebral arteries: Left dominant. Normal. Superior cerebellar arteries: Normal. Anterior inferior cerebellar arteries: Normal. Posterior inferior cerebellar arteries: Normal. IMPRESSION: 1. Masslike parenchymal contrast enhancement adjacent to the left MCA M1 and proximal M2 segments with large area of surrounding hyperintense T2-weighted signal. The appearance is concerning for lymphoma -- including the intravascular variant -- with a large amount of surrounding edema. A primary CNS neoplasm, most likely a high grade glioma, is also a possibility, in which case the abnormal T2WI hyperintensity could indicate edema or non-enhancing tumor. Vasculitis is less likely in the context of normal MRA, but remains a consideration (MRA is less sensitive for detecting findings of vasculitis than CTA or conventional angiography). 2. Normal MRA. 3. Unchanged 6 mm rightward midline shift. Electronically Signed   By: Ulyses Jarred M.D.   On: 01/01/2018 00:39    Lab Data:  CBC: Recent Labs  Lab 12/31/17 1638 12/31/17 2204 01/01/18 0209 01/02/18 0500 01/03/18 0354 01/04/18 0445  WBC 9.4  --  11.3* 15.2* 10.5 9.3  NEUTROABS 7.9*  --   --   --  9.6*  --   HGB 15.3* 13.9 14.0 15.4* 14.0 13.7  HCT 45.3 41.0 41.0 44.1 42.1 40.9  MCV 93.4  --  92.3 91.3 93.8 93.0  PLT 424*  --  378 421* 391 778   Basic Metabolic Panel: Recent Labs  Lab 12/31/17 1638 12/31/17 2204 01/01/18 0209 01/02/18 0500 01/03/18 0354 01/04/18 0445  NA 136 135 133* 136 140 138  K 3.6 4.5 3.8 3.4* 3.7 3.8  CL 101 103 101 105 108 107  CO2 24  --  21* 20* 22 23  GLUCOSE 115* 110* 147* 161* 149* 149*   BUN 17 24* 15 16 18 18   CREATININE 0.74 0.60 0.62 0.52 0.55 0.55  CALCIUM 9.2  --  9.3 9.3 8.8* 8.5*  MG  --   --   --  1.9 2.2  --    GFR: Estimated Creatinine Clearance: 81.8 mL/min (by C-G formula based on SCr of 0.55 mg/dL). Liver Function Tests: Recent Labs  Lab 12/31/17 1638 01/01/18 0209 01/03/18 0354  AST 15 15 19   ALT 14 17 21   ALKPHOS 118 113 88  BILITOT 0.7 0.6 0.7  PROT 7.2 6.5 6.2*  ALBUMIN 4.1 3.7 3.4*   No results for input(s): LIPASE, AMYLASE in the last 168 hours. No results for input(s): AMMONIA in the last 168 hours. Coagulation Profile: Recent Labs  Lab 12/31/17 2156  INR 1.19   Cardiac Enzymes: No results for input(s): CKTOTAL, CKMB, CKMBINDEX, TROPONINI in the last 168 hours. BNP (last 3 results) No results for input(s): PROBNP in the last 8760 hours. HbA1C: No results for input(s): HGBA1C in the last 72 hours. CBG: Recent Labs  Lab 01/04/18 0757 01/04/18 1132 01/05/18 2423  01/05/18 0744 01/05/18 1207  GLUCAP 128* 119* 145* 149* 141*   Lipid Profile: No results for input(s): CHOL, HDL, LDLCALC, TRIG, CHOLHDL, LDLDIRECT in the last 72 hours. Thyroid Function Tests: No results for input(s): TSH, T4TOTAL, FREET4, T3FREE, THYROIDAB in the last 72 hours. Anemia Panel: No results for input(s): VITAMINB12, FOLATE, FERRITIN, TIBC, IRON, RETICCTPCT in the last 72 hours. Urine analysis:    Component Value Date/Time   COLORURINE YELLOW 01/01/2018 0152   APPEARANCEUR HAZY (A) 01/01/2018 0152   LABSPEC 1.024 01/01/2018 0152   PHURINE 5.0 01/01/2018 0152   GLUCOSEU NEGATIVE 01/01/2018 0152   HGBUR LARGE (A) 01/01/2018 0152   BILIRUBINUR NEGATIVE 01/01/2018 [place] 01/01/2018 0152   PROTEINUR NEGATIVE 01/01/2018 0152   NITRITE NEGATIVE 01/01/2018 0152   LEUKOCYTESUR MODERATE (A) 01/01/2018 0152     Kelisha Dall M.D. [facility] 01/05/2018, 2:28 PM  Pager: [phone] Between 7am to 7pm - call Pager -  [phone]  After 7pm go to [email or web address] - password [email or web address]  Call night coverage person covering after 7pm

## 2018-01-05 NOTE — Consult Note (Signed)
[facility] CONSULT NOTE  Patient Care Team: Patient, No Pcp Per as PCP - General (General Practice)  CHIEF COMPLAINTS/PURPOSE OF CONSULTATION:  Dysphasia  HISTORY OF PRESENTING ILLNESS:  Tara [name] 67 y.o. female presented to medical attention this past weekend with noted progression of difficulty speaking, described as "difficulty getting words out" and also "not making sense while talking".  No weakness or difficulty with the right side was noted, until some dysfunction in the arm and leg were identified in the hospital.  Brain MRI demonstrated an enhancing lesion with surrounding T2 signal consistent with likely mass.  Systemic workup was unremarkable.  Yesterday she underwent brain biopsy with Dr. Vertell Limber.  Today she feels tired from surgery and is still having some difficulty with speech.    MEDICAL HISTORY:  History reviewed. No pertinent past medical history.  SURGICAL HISTORY: History reviewed. No pertinent surgical history.  SOCIAL HISTORY: Social History   Socioeconomic History  . Marital status: Married    Spouse name: Not on file  . Number of children: Not on file  . Years of education: Not on file  . Highest education level: Not on file  Social Needs  . Financial resource strain: Not on file  . Food insecurity - worry: Not on file  . Food insecurity - inability: Not on file  . Transportation needs - medical: Not on file  . Transportation needs - non-medical: Not on file  Occupational History  . Not on file  Tobacco Use  . Smoking status: Current Every Day Smoker    Packs/day: 1.00    Types: Cigarettes  . Smokeless tobacco: Never Used  Substance and Sexual Activity  . Alcohol use: No    Frequency: Never  . Drug use: No  . Sexual activity: Yes  Other Topics Concern  . Not on file  Social History Narrative  . Not on file    FAMILY HISTORY: Family History  Problem Relation Age of Onset  . Colon cancer Mother   . CAD  Father     ALLERGIES:  has No Known Allergies.  MEDICATIONS:  Current Facility-Administered Medications  Medication Dose Route Frequency Provider Last Rate Last Dose  . 0.9 % NaCl with KCl 20 mEq/ L  infusion   Intravenous Continuous Erline Levine, MD   Stopped at 01/05/18 [phone]  . acetaminophen (TYLENOL) tablet 650 mg  650 mg Oral Q4H PRN Erline Levine, MD       Or  . acetaminophen (TYLENOL) suppository 650 mg  650 mg Rectal Q4H PRN Erline Levine, MD      . bisacodyl (DULCOLAX) suppository 10 mg  10 mg Rectal Daily PRN Erline Levine, MD      . dexamethasone (DECADRON) injection 6 mg  6 mg Intravenous Q6H Erline Levine, MD   6 mg at 01/05/18 7096   Followed by  . [START ON 01/06/2018] dexamethasone (DECADRON) injection 4 mg  4 mg Intravenous Q6H Erline Levine, MD       Followed by  . [START ON 01/07/2018] dexamethasone (DECADRON) injection 4 mg  4 mg Intravenous Milinda Pointer, MD      . docusate sodium (COLACE) capsule 100 mg  100 mg Oral BID Erline Levine, MD   100 mg at 01/04/18 2141  . HYDROcodone-acetaminophen (NORCO/VICODIN) 5-325 MG per tablet 1 tablet  1 tablet Oral Q4H PRN Erline Levine, MD      . labetalol (NORMODYNE,TRANDATE) injection 10-40 mg  10-40 mg Intravenous Q10 min PRN Vertell Limber,  Broadus John, MD   10 mg at 01/04/18 2009  . levETIRAcetam (KEPPRA) IVPB 500 mg/100 mL premix  500 mg Intravenous Q12H Erline Levine, MD   Stopped at 01/05/18 [phone]  . morphine 4 MG/ML injection 2 mg  2 mg Intravenous Q2H PRN Erline Levine, MD      . ondansetron [facility]) tablet 4 mg  4 mg Oral Q4H PRN Erline Levine, MD       Or  . ondansetron [facility]) injection 4 mg  4 mg Intravenous Q4H PRN Erline Levine, MD      . pantoprazole (PROTONIX) injection 40 mg  40 mg Intravenous QHS Erline Levine, MD   40 mg at 01/04/18 2210  . polyethylene glycol (MIRALAX / GLYCOLAX) packet 17 g  17 g Oral Daily PRN Erline Levine, MD      . promethazine (PHENERGAN) tablet 12.5-25 mg  12.5-25 mg Oral Q4H PRN Erline Levine, MD       . sodium phosphate (FLEET) 7-19 GM/118ML enema 1 enema  1 enema Rectal Once PRN Erline Levine, MD        REVIEW OF SYSTEMS:   Limited by dysphasia  PHYSICAL EXAMINATION: Vitals:   01/05/18 0800 01/05/18 0900  BP: (!) 165/116 (!) 149/101  Pulse: 74 60  Resp: 14 12  Temp: 98.1 F (36.7 C)   SpO2: 95% 96%   KPS: 60. General: Alert, cooperative, pleasant, in no acute distress Head: Craniotomy scar noted, dry and intact. EENT: Soft tissue edema left face Lungs: Resp effort normal Cardiac: Regular rate and rhythm Abdomen: Soft, non-distended abdomen Skin: No rashes cyanosis or petechiae. Extremities: No clubbing or edema  NEUROLOGIC EXAM: Mental Status: Drowsy, but stimulates to alertness, intermittently attentive to examiner. Oriented to self and environment.  Language is fluent in short bursts only with occassional nonsensical language. Comprehension intact to simple commands, some errors with 2-step.  Cranial Nerves: Visual acuity is grossly normal. Visual fields are full. Extra-ocular movements intact. No ptosis. Face is symmetric, tongue midline. Motor: Both arms and legs antigravity against resistance. Reflexes are symmetric, no pathologic reflexes present. Intact finger to nose bilaterally Sensory: Intact to light touch and temperature Gait: Deferred   LABORATORY DATA:  I have reviewed the data as listed Lab Results  Component Value Date   WBC 9.3 01/04/2018   HGB 13.7 01/04/2018   HCT 40.9 01/04/2018   MCV 93.0 01/04/2018   PLT 374 01/04/2018   Recent Labs    12/31/17 1638  01/01/18 0209 01/02/18 0500 01/03/18 0354 01/04/18 0445  NA 136   < > 133* 136 140 138  K 3.6   < > 3.8 3.4* 3.7 3.8  CL 101   < > 101 105 108 107  CO2 24  --  21* 20* 22 23  GLUCOSE 115*   < > 147* 161* 149* 149*  BUN 17   < > 15 16 18 18   CREATININE 0.74   < > 0.62 0.52 0.55 0.55  CALCIUM 9.2  --  9.3 9.3 8.8* 8.5*  GFRNONAA >60  --  >60 >60 >60 >60  GFRAA >60  --  >60 >60 >60  >60  PROT 7.2  --  6.5  --  6.2*  --   ALBUMIN 4.1  --  3.7  --  3.4*  --   AST 15  --  15  --  19  --   ALT 14  --  17  --  21  --   ALKPHOS 118  --  113  --  88  --   BILITOT 0.7  --  0.6  --  0.7  --    < > = values in this interval not displayed.    RADIOGRAPHIC STUDIES: I have personally reviewed the radiological images as listed and agreed with the findings in the report.  Ct Head Wo Contrast  Result Date: 01/01/2018 CLINICAL DATA:  Slurred speech and right-sided weakness. EXAM: CT HEAD WITHOUT CONTRAST TECHNIQUE: Contiguous axial images were obtained from the base of the skull through the vertex without intravenous contrast. COMPARISON:  Head CT and brain MRI dated 12/31/2017 FINDINGS: Brain: The masslike area of heterogeneous attenuation, primarily hypoattenuation, surrounding the central left middle cerebral artery and extending from the left temporal lobe through the base a ganglia to the left centrum semiovale, is without change from the previous day's exams. There is still mild midline shift to the right currently measuring 4 mm. There are no new areas of abnormal parenchymal attenuation and no new areas of mass effect. No intracranial hemorrhage. Vascular: No hyperdense vessel or unexpected calcification. Skull: Normal. Negative for fracture or focal lesion. Sinuses/Orbits: Globes and orbits are unremarkable. Sinuses and mastoid air cells are clear. Other: None. IMPRESSION: 1. No significant change from the previous day's exams. 2. Masslike area of abnormal attenuation, primarily hypoattenuation, with associated mass effect, on the left, centered on the left basal ganglia and surrounding the left middle cerebral artery, is unchanged in extent and degree of mass effect. 3. No new abnormalities.  No intracranial hemorrhage. Electronically Signed   By: Lajean Manes M.D.   On: 01/01/2018 11:35   Ct Head Wo Contrast  Result Date: 12/31/2017 CLINICAL DATA:  Altered level of consciousness.  EXAM: CT HEAD WITHOUT CONTRAST TECHNIQUE: Contiguous axial images were obtained from the base of the skull through the vertex without intravenous contrast. COMPARISON:  None. FINDINGS: Brain: Large amount of white matter edema is seen in the left basal ganglia and temporal lobe. This is concerning for underlying neoplasm. This results in 9 mm of left-to-right midline shift. No ventricular dilatation is noted. No definite hemorrhage is noted. Vascular: No hyperdense vessel or unexpected calcification. Skull: Normal. Negative for fracture or focal lesion. Sinuses/Orbits: No acute finding. Other: None. IMPRESSION: Large amount of white matter edema is seen in the left basal ganglia and left temporal lobe concerning for underlying neoplasm. This results in 9 mm of left-to-right midline shift. Further evaluation with MRI with and without gadolinium administration is recommended. Electronically Signed   By: Marijo Conception, M.D.   On: 12/31/2017 21:32   Ct Chest W Contrast  Result Date: 01/01/2018 CLINICAL DATA:  Findings worrisome for CNS lymphoma versus glioma brain MRI. For staging. EXAM: CT CHEST, ABDOMEN, AND PELVIS WITH CONTRAST TECHNIQUE: Multidetector CT imaging of the chest, abdomen and pelvis was performed following the standard protocol during bolus administration of intravenous contrast. CONTRAST:  151mL ISOVUE-300 IOPAMIDOL (ISOVUE-300) INJECTION 61% COMPARISON:  None. FINDINGS: CT CHEST FINDINGS Cardiovascular: Heart is normal in size.  No pericardial effusion. No evidence of thoracic aortic aneurysm. Mediastinum/Nodes: No suspicious mediastinal, hilar, or axillary lymphadenopathy. Visualized thyroid is unremarkable. Lungs/Pleura: Mild dependent atelectasis in the bilateral lower lobes and posterior left upper lobe. No suspicious pulmonary nodules. No focal consolidation. No pleural effusion or pneumothorax. Musculoskeletal: Visualized osseous structures are within normal limits. CT ABDOMEN PELVIS  FINDINGS Hepatobiliary: 13 mm cyst in segment 5 of the liver (series 3/image 56). Layering gallstone (series 3/image 34). No associated inflammatory changes.  No intrahepatic or extrahepatic ductal dilatation. Pancreas: Within normal limits. Spleen: Spleen is normal in size. Adrenals/Urinary Tract: Adrenal glands are within normal limits. 3 mm nonobstructing left lower pole renal calculus (series 3/image 70). Multiple left renal cysts, measuring up to 5.1 cm in the posterior left lower kidney (series 3/image 69), measuring simple fluid density (Bosniak I). Right kidney is within normal limits. No hydronephrosis. Bladder is within normal limits. Stomach/Bowel: Stomach is notable for a tiny hiatal hernia. No evidence of bowel obstruction. Normal appendix (series 3/image 92). Mild sigmoid diverticulosis, without evidence of diverticulitis. Vascular/Lymphatic: No evidence of abdominal aortic aneurysm. Atherosclerotic calcifications of the abdominal aorta and branch vessels. No suspicious abdominopelvic lymphadenopathy. Reproductive: Uterus is within normal limits. Bilateral ovaries are within normal limits. Other: No abdominopelvic ascites. Musculoskeletal: Mild degenerative changes of the lumbar spine. IMPRESSION: No findings suspicious for malignancy (primary or metastatic) in the chest, abdomen, or pelvis. No suspicious lymphadenopathy.  Spleen is normal in size. 3 mm nonobstructing left lower pole renal calculus. Multiple left renal cysts, benign (Bosniak I). Cholelithiasis, without associated inflammatory changes. Electronically Signed   By: Julian Hy M.D.   On: 01/01/2018 09:12   Mr Jeri Cos BT Contrast  Result Date: 01/03/2018 CLINICAL DATA:  Primary CNS lymphoma.  Surgical planning EXAM: MRI HEAD WITHOUT AND WITH CONTRAST TECHNIQUE: Multiplanar, multiecho pulse sequences of the brain and surrounding structures were obtained without and with intravenous contrast. CONTRAST:  78mL MULTIHANCE GADOBENATE  DIMEGLUMINE 529 MG/ML IV SOLN COMPARISON:  MRI head 12/31/2017, CT head 12/31/2017 FINDINGS: Brain: Large area of edema surrounding the left sylvian fissure similar to the prior MRI. This involves much of the temporal lobe as well as the left posterior frontal lobe, and the left frontal and operculum and left parietal operculum. The prior MRI demonstrated enhancement along the left middle cerebral artery in the sylvian fissure most compatible with tumor. Negative for hemorrhage. Contrast not administered today. Mild mass-effect on the left lateral ventricle with 4 mm midline shift to the right. 10 x 15 mm focus of restricted diffusion left periventricular white matter and basal ganglia is new since the prior study, consistent with acute infarction. Small hyperintensity in the right parietal white matter unchanged. 1 cm lesion in the left cerebellum hyperintense on T2 with facilitated diffusion also unchanged. Vascular: Normal arterial flow voids. Skull and upper cervical spine: Negative Sinuses/Orbits: Negative Other: None IMPRESSION: Large area of edema in the left cerebral white matter similar to the recent MRI. Contrast not administered today however abnormal enhancement along the left middle cerebral artery in the sylvian fissure is seen previously, most likely tumor. 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 4 mm midline shift to the right unchanged. 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 Small hyperintensities left cerebellum and right periventricular white matter likely due to chronic ischemia. These are unchanged. Electronically Signed   By: Franchot Gallo M.D.   On: 01/03/2018 16:47   Mr Brain W And Wo Contrast  Result Date: 01/01/2018 CLINICAL DATA:  Confusion and mass demonstrated on earlier head CT EXAM: MRI HEAD WITHOUT AND WITH CONTRAST MRA HEAD WITHOUT CONTRAST TECHNIQUE: Multiplanar, multiecho pulse sequences of the brain and surrounding structures were obtained without and with  intravenous contrast. Angiographic images of the head were obtained using MRA technique without contrast. CONTRAST:  66mL MULTIHANCE GADOBENATE DIMEGLUMINE 529 MG/ML IV SOLN COMPARISON:  Head CT 12/31/2017 FINDINGS: MRI HEAD FINDINGS Brain: The midline structures are normal. No focal diffusion restriction to indicate  acute infarct. No intraparenchymal hemorrhage. Large amount of hyperintense T2-weighted signal in the left basal ganglia, left insula and left temporal lobe. There is an area of contrast enhancement within the brain parenchyma following the left middle cerebral artery M1 and proximal M2 segments. The largest confluent area of enhancement measures 2.2 x 1.3 cm. Rightward midline shift of 6 mm is unchanged. No chronic microhemorrhage or cerebral amyloid angiopathy. No hydrocephalus, age advanced atrophy or lobar predominant volume loss. No dural abnormality or extra-axial collection. Skull and upper cervical spine: The visualized skull base, calvarium, upper cervical spine and extracranial soft tissues are normal. Sinuses/Orbits: No fluid levels or advanced mucosal thickening. No mastoid effusion. Normal orbits. MRA HEAD FINDINGS Intracranial internal carotid arteries: Normal. Anterior cerebral arteries: Normal. Middle cerebral arteries: Normal. Posterior communicating arteries: Present on the right. Posterior cerebral arteries: Normal. Fetal origin on the right. Basilar artery: Normal. Vertebral arteries: Left dominant. Normal. Superior cerebellar arteries: Normal. Anterior inferior cerebellar arteries: Normal. Posterior inferior cerebellar arteries: Normal. IMPRESSION: 1. Masslike parenchymal contrast enhancement adjacent to the left MCA M1 and proximal M2 segments with large area of surrounding hyperintense T2-weighted signal. The appearance is concerning for lymphoma -- including the intravascular variant -- with a large amount of surrounding edema. A primary CNS neoplasm, most likely a high grade  glioma, is also a possibility, in which case the abnormal T2WI hyperintensity could indicate edema or non-enhancing tumor. Vasculitis is less likely in the context of normal MRA, but remains a consideration (MRA is less sensitive for detecting findings of vasculitis than CTA or conventional angiography). 2. Normal MRA. 3. Unchanged 6 mm rightward midline shift. Electronically Signed   By: Ulyses Jarred M.D.   On: 01/01/2018 00:39   Ct Abdomen Pelvis W Contrast  Result Date: 01/01/2018 CLINICAL DATA:  Findings worrisome for CNS lymphoma versus glioma brain MRI. For staging. EXAM: CT CHEST, ABDOMEN, AND PELVIS WITH CONTRAST TECHNIQUE: Multidetector CT imaging of the chest, abdomen and pelvis was performed following the standard protocol during bolus administration of intravenous contrast. CONTRAST:  156mL ISOVUE-300 IOPAMIDOL (ISOVUE-300) INJECTION 61% COMPARISON:  None. FINDINGS: CT CHEST FINDINGS Cardiovascular: Heart is normal in size.  No pericardial effusion. No evidence of thoracic aortic aneurysm. Mediastinum/Nodes: No suspicious mediastinal, hilar, or axillary lymphadenopathy. Visualized thyroid is unremarkable. Lungs/Pleura: Mild dependent atelectasis in the bilateral lower lobes and posterior left upper lobe. No suspicious pulmonary nodules. No focal consolidation. No pleural effusion or pneumothorax. Musculoskeletal: Visualized osseous structures are within normal limits. CT ABDOMEN PELVIS FINDINGS Hepatobiliary: 13 mm cyst in segment 5 of the liver (series 3/image 56). Layering gallstone (series 3/image 34). No associated inflammatory changes. No intrahepatic or extrahepatic ductal dilatation. Pancreas: Within normal limits. Spleen: Spleen is normal in size. Adrenals/Urinary Tract: Adrenal glands are within normal limits. 3 mm nonobstructing left lower pole renal calculus (series 3/image 70). Multiple left renal cysts, measuring up to 5.1 cm in the posterior left lower kidney (series 3/image 69),  measuring simple fluid density (Bosniak I). Right kidney is within normal limits. No hydronephrosis. Bladder is within normal limits. Stomach/Bowel: Stomach is notable for a tiny hiatal hernia. No evidence of bowel obstruction. Normal appendix (series 3/image 92). Mild sigmoid diverticulosis, without evidence of diverticulitis. Vascular/Lymphatic: No evidence of abdominal aortic aneurysm. Atherosclerotic calcifications of the abdominal aorta and branch vessels. No suspicious abdominopelvic lymphadenopathy. Reproductive: Uterus is within normal limits. Bilateral ovaries are within normal limits. Other: No abdominopelvic ascites. Musculoskeletal: Mild degenerative changes of the lumbar spine. IMPRESSION: No findings suspicious  for malignancy (primary or metastatic) in the chest, abdomen, or pelvis. No suspicious lymphadenopathy.  Spleen is normal in size. 3 mm nonobstructing left lower pole renal calculus. Multiple left renal cysts, benign (Bosniak I). Cholelithiasis, without associated inflammatory changes. Electronically Signed   By: Julian Hy M.D.   On: 01/01/2018 09:12   Mr Jodene Nam Head (cerebral Arteries)  Result Date: 01/01/2018 CLINICAL DATA:  Confusion and mass demonstrated on earlier head CT EXAM: MRI HEAD WITHOUT AND WITH CONTRAST MRA HEAD WITHOUT CONTRAST TECHNIQUE: Multiplanar, multiecho pulse sequences of the brain and surrounding structures were obtained without and with intravenous contrast. Angiographic images of the head were obtained using MRA technique without contrast. CONTRAST:  37mL MULTIHANCE GADOBENATE DIMEGLUMINE 529 MG/ML IV SOLN COMPARISON:  Head CT 12/31/2017 FINDINGS: MRI HEAD FINDINGS Brain: The midline structures are normal. No focal diffusion restriction to indicate acute infarct. No intraparenchymal hemorrhage. Large amount of hyperintense T2-weighted signal in the left basal ganglia, left insula and left temporal lobe. There is an area of contrast enhancement within the brain  parenchyma following the left middle cerebral artery M1 and proximal M2 segments. The largest confluent area of enhancement measures 2.2 x 1.3 cm. Rightward midline shift of 6 mm is unchanged. No chronic microhemorrhage or cerebral amyloid angiopathy. No hydrocephalus, age advanced atrophy or lobar predominant volume loss. No dural abnormality or extra-axial collection. Skull and upper cervical spine: The visualized skull base, calvarium, upper cervical spine and extracranial soft tissues are normal. Sinuses/Orbits: No fluid levels or advanced mucosal thickening. No mastoid effusion. Normal orbits. MRA HEAD FINDINGS Intracranial internal carotid arteries: Normal. Anterior cerebral arteries: Normal. Middle cerebral arteries: Normal. Posterior communicating arteries: Present on the right. Posterior cerebral arteries: Normal. Fetal origin on the right. Basilar artery: Normal. Vertebral arteries: Left dominant. Normal. Superior cerebellar arteries: Normal. Anterior inferior cerebellar arteries: Normal. Posterior inferior cerebellar arteries: Normal. IMPRESSION: 1. Masslike parenchymal contrast enhancement adjacent to the left MCA M1 and proximal M2 segments with large area of surrounding hyperintense T2-weighted signal. The appearance is concerning for lymphoma -- including the intravascular variant -- with a large amount of surrounding edema. A primary CNS neoplasm, most likely a high grade glioma, is also a possibility, in which case the abnormal T2WI hyperintensity could indicate edema or non-enhancing tumor. Vasculitis is less likely in the context of normal MRA, but remains a consideration (MRA is less sensitive for detecting findings of vasculitis than CTA or conventional angiography). 2. Normal MRA. 3. Unchanged 6 mm rightward midline shift. Electronically Signed   By: Ulyses Jarred M.D.   On: 01/01/2018 00:39    ASSESSMENT & PLAN:   Brain mass lesion  Ms. Bazaldua tolerated surgery quite well.  She is  ready for physical therapy and occupational therapy evaluations.    Unfortunately preliminary frozen pathology was non-diagnostic.  We will await formal pathology report, and in the meantime, case will be discussed further in multidisciplinary tumor board on 01/10/18.    Extensive conversation with family communicated plan, which is all pending on path results at this time.  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   Can discharge on 4mg  BID decadron or per Dr. Melven Sartorius recommendation.  She will follow up in neuro-oncology clinic at [facility]  All questions were answered. The patient knows to call the clinic with any problems, questions or concerns.  The total time spent in the encounter was 80 minutes and more than 50% was on counseling and review of test results  [name], MD 01/05/2018 10:21 AM

## 2018-01-05 NOTE — Progress Notes (Signed)
Pt transferred from 4N ICU s/p Craniotomy, pt alert with expressive aphasia, settled in bed with call light and family at bedside, tele monitor put and verified on pt, was however reassured and will continue to monitor. Obasogie-Asidi, Tara [name]

## 2018-01-05 NOTE — Progress Notes (Addendum)
Subjective: Patient reports "A little sore"(pointing to forehead)  Objective: Vital signs in last 24 hours: Temp:  [96.6 F (35.9 C)-98.6 F (37 C)] 96.6 F (35.9 C) (03/06 0319) Pulse Rate:  [37-106] 58 (03/06 0700) Resp:  [9-19] 11 (03/06 0700) BP: (129-188)/(73-112) 158/101 (03/06 0700) SpO2:  [85 %-100 %] 96 % (03/06 0700) Arterial Line BP: (103-174)/(82-136) 122/108 (03/06 0700)  Intake/Output from previous day: 03/05 0701 - 03/06 0700 In: 3196.3 [I.V.:3046.3] Out: 2400 [Urine:2325; Blood:75] Intake/Output this shift: No intake/output data recorded.  Opens eyes on request, follows commands with all extremities. Confusion through the night, annoyed with Foley. Currently states name and "Sneads" for location. Drsg intact, small amount of bloody drainage on Telfa.  Lab Results: Recent Labs    01/03/18 0354 01/04/18 0445  WBC 10.5 9.3  HGB 14.0 13.7  HCT 42.1 40.9  PLT 391 374   BMET Recent Labs    01/03/18 0354 01/04/18 0445  NA 140 138  K 3.7 3.8  CL 108 107  CO2 22 23  GLUCOSE 149* 149*  BUN 18 18  CREATININE 0.55 0.55  CALCIUM 8.8* 8.5*    Studies/Results: Mr Jeri Cos ZR Contrast  Result Date: 01/03/2018 CLINICAL DATA:  Primary CNS lymphoma.  Surgical planning EXAM: MRI HEAD WITHOUT AND WITH CONTRAST TECHNIQUE: Multiplanar, multiecho pulse sequences of the brain and surrounding structures were obtained without and with intravenous contrast. CONTRAST:  4mL MULTIHANCE GADOBENATE DIMEGLUMINE 529 MG/ML IV SOLN COMPARISON:  MRI head 12/31/2017, CT head 12/31/2017 FINDINGS: Brain: Large area of edema surrounding the left sylvian fissure similar to the prior MRI. This involves much of the temporal lobe as well as the left posterior frontal lobe, and the left frontal and operculum and left parietal operculum. The prior MRI demonstrated enhancement along the left middle cerebral artery in the sylvian fissure most compatible with tumor. Negative for hemorrhage.  Contrast not administered today. Mild mass-effect on the left lateral ventricle with 4 mm midline shift to the right. 10 x 15 mm focus of restricted diffusion left periventricular white matter and basal ganglia is new since the prior study, consistent with acute infarction. Small hyperintensity in the right parietal white matter unchanged. 1 cm lesion in the left cerebellum hyperintense on T2 with facilitated diffusion also unchanged. Vascular: Normal arterial flow voids. Skull and upper cervical spine: Negative Sinuses/Orbits: Negative Other: None IMPRESSION: Large area of edema in the left cerebral white matter similar to the recent MRI. Contrast not administered today however abnormal enhancement along the left middle cerebral artery in the sylvian fissure is seen previously, most likely tumor. 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 4 mm midline shift to the right unchanged. 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 Small hyperintensities left cerebellum and right periventricular white matter likely due to chronic ischemia. These are unchanged. Electronically Signed   By: Franchot Gallo M.D.   On: 01/03/2018 16:47    Assessment/Plan:   LOS: 5 days  Continue support, mobilizing gradually.    Tara [name] 01/05/2018, 8:06 AM   Patient is doing well.  Her speech and motor exam are stable from preop.  We will await Pathology results from biopsy.  Dr. Mickeal Skinner to meet with patient today.

## 2018-01-06 ENCOUNTER — Inpatient Hospital Stay (HOSPITAL_COMMUNITY)
Admission: RE | Admit: 2018-01-06 | Discharge: 2018-01-12 | DRG: 057 | Disposition: A | Discharge status: Home / Self Care | Payer: Medicare Other | Source: Intra-hospital | Attending: Physical Medicine & Rehabilitation | Admitting: Physical Medicine & Rehabilitation

## 2018-01-06 ENCOUNTER — Encounter (HOSPITAL_COMMUNITY): Payer: Self-pay | Admitting: Nurse Practitioner

## 2018-01-06 DIAGNOSIS — G939 Disorder of brain, unspecified: Secondary | ICD-10-CM | POA: Diagnosis not present

## 2018-01-06 DIAGNOSIS — I69398 Other sequelae of cerebral infarction: Secondary | ICD-10-CM | POA: Diagnosis not present

## 2018-01-06 DIAGNOSIS — I169 Hypertensive crisis, unspecified: Secondary | ICD-10-CM | POA: Diagnosis not present

## 2018-01-06 DIAGNOSIS — I69351 Hemiplegia and hemiparesis following cerebral infarction affecting right dominant side: Principal | ICD-10-CM

## 2018-01-06 DIAGNOSIS — Z48811 Encounter for surgical aftercare following surgery on the nervous system: Secondary | ICD-10-CM | POA: Diagnosis not present

## 2018-01-06 DIAGNOSIS — I10 Essential (primary) hypertension: Secondary | ICD-10-CM | POA: Diagnosis not present

## 2018-01-06 DIAGNOSIS — K5900 Constipation, unspecified: Secondary | ICD-10-CM | POA: Diagnosis present

## 2018-01-06 DIAGNOSIS — R569 Unspecified convulsions: Secondary | ICD-10-CM

## 2018-01-06 DIAGNOSIS — R4701 Aphasia: Secondary | ICD-10-CM

## 2018-01-06 DIAGNOSIS — R269 Unspecified abnormalities of gait and mobility: Secondary | ICD-10-CM | POA: Diagnosis not present

## 2018-01-06 DIAGNOSIS — F17210 Nicotine dependence, cigarettes, uncomplicated: Secondary | ICD-10-CM | POA: Diagnosis present

## 2018-01-06 DIAGNOSIS — E46 Unspecified protein-calorie malnutrition: Secondary | ICD-10-CM

## 2018-01-06 DIAGNOSIS — G9389 Other specified disorders of brain: Secondary | ICD-10-CM | POA: Diagnosis present

## 2018-01-06 DIAGNOSIS — I69320 Aphasia following cerebral infarction: Secondary | ICD-10-CM

## 2018-01-06 DIAGNOSIS — R799 Abnormal finding of blood chemistry, unspecified: Secondary | ICD-10-CM

## 2018-01-06 DIAGNOSIS — G40909 Epilepsy, unspecified, not intractable, without status epilepticus: Secondary | ICD-10-CM | POA: Diagnosis present

## 2018-01-06 DIAGNOSIS — R7989 Other specified abnormal findings of blood chemistry: Secondary | ICD-10-CM | POA: Diagnosis not present

## 2018-01-06 DIAGNOSIS — E8809 Other disorders of plasma-protein metabolism, not elsewhere classified: Secondary | ICD-10-CM

## 2018-01-06 LAB — GLUCOSE, CAPILLARY
Glucose-Capillary: 206 mg/dL — ABNORMAL HIGH (ref 65–99)
Glucose-Capillary: 139 mg/dL — ABNORMAL HIGH (ref 65–99)
Glucose-Capillary: 128 mg/dL — ABNORMAL HIGH (ref 65–99)

## 2018-01-06 LAB — BASIC METABOLIC PANEL
Anion gap: 8 (ref 5–15)
BUN: 18 mg/dL (ref 6–20)
CO2: 26 mmol/L (ref 22–32)
Calcium: 8.4 mg/dL — ABNORMAL LOW (ref 8.9–10.3)
Chloride: 104 mmol/L (ref 101–111)
Creatinine, Ser: 0.53 mg/dL (ref 0.44–1.00)
GFR calc Af Amer: >60 mL/min (ref >60)
GFR calc non Af Amer: >60 mL/min (ref >60)
Glucose, Bld: 152 mg/dL — ABNORMAL HIGH (ref 65–99)
Potassium: 3.9 mmol/L (ref 3.5–5.1)
Sodium: 138 mmol/L (ref 135–145)

## 2018-01-06 MED ORDER — DEXAMETHASONE SODIUM PHOSPHATE 4 MG/ML IJ SOLN: 4.0000 mg | Freq: Three times a day (TID) | INTRAMUSCULAR | 0 refills | Status: DC

## 2018-01-06 MED ORDER — SORBITOL 70 % SOLN
30.0000 mL | Freq: Every day | Status: DC | PRN
  Administered 2018-01-08 – 2018-01-10 (×2): 30 mL via ORAL
  Filled 2018-01-06 (×2): qty 30

## 2018-01-06 MED ORDER — BISACODYL 10 MG RE SUPP: 10.0000 mg | Freq: Every day | RECTAL | Status: DC | PRN

## 2018-01-06 MED ORDER — LEVETIRACETAM 500 MG PO TABS: 500.0000 mg | ORAL_TABLET | Freq: Two times a day (BID) | ORAL | 0 refills | Status: DC

## 2018-01-06 MED ORDER — ACETAMINOPHEN 325 MG PO TABS: 650.0000 mg | ORAL_TABLET | ORAL | 0 refills | Status: DC | PRN

## 2018-01-06 MED ORDER — ONDANSETRON HCL 4 MG PO TABS: 4.0000 mg | ORAL_TABLET | ORAL | Status: DC | PRN

## 2018-01-06 MED ORDER — DOCUSATE SODIUM 100 MG PO CAPS
100.0000 mg | ORAL_CAPSULE | Freq: Two times a day (BID) | ORAL | Status: DC
  Administered 2018-01-06 – 2018-01-12 (×12): 100 mg via ORAL
  Filled 2018-01-06 (×12): qty 1

## 2018-01-06 MED ORDER — ONDANSETRON HCL 4 MG PO TABS: 4.0000 mg | ORAL_TABLET | ORAL | 0 refills | Status: DC | PRN

## 2018-01-06 MED ORDER — LABETALOL HCL 5 MG/ML IV SOLN: 10.0000 mg | INTRAVENOUS | 0 refills | Status: DC | PRN

## 2018-01-06 MED ORDER — DEXAMETHASONE 4 MG PO TABS
4.0000 mg | ORAL_TABLET | Freq: Two times a day (BID) | ORAL | Status: DC
  Administered 2018-01-06 – 2018-01-12 (×12): 4 mg via ORAL
  Filled 2018-01-06 (×12): qty 1

## 2018-01-06 MED ORDER — HYDROCODONE-ACETAMINOPHEN 5-325 MG PO TABS: 1.0000 | ORAL_TABLET | ORAL | 0 refills | Status: DC | PRN

## 2018-01-06 MED ORDER — LEVETIRACETAM 500 MG PO TABS
500.0000 mg | ORAL_TABLET | Freq: Two times a day (BID) | ORAL | Status: DC
  Administered 2018-01-06 – 2018-01-12 (×12): 500 mg via ORAL
  Filled 2018-01-06 (×12): qty 1

## 2018-01-06 MED ORDER — PANTOPRAZOLE SODIUM 40 MG IV SOLR
40.0000 mg | Freq: Every day | INTRAVENOUS | Status: DC
  Administered 2018-01-06: 40 mg via INTRAVENOUS
  Filled 2018-01-06: qty 40

## 2018-01-06 MED ORDER — ACETAMINOPHEN 650 MG RE SUPP: 650.0000 mg | RECTAL | Status: DC | PRN

## 2018-01-06 MED ORDER — DEXAMETHASONE SODIUM PHOSPHATE 4 MG/ML IJ SOLN: 4.0000 mg | Freq: Four times a day (QID) | INTRAMUSCULAR | 0 refills | Status: DC

## 2018-01-06 MED ORDER — ACETAMINOPHEN 325 MG PO TABS: 650.0000 mg | ORAL_TABLET | ORAL | Status: DC | PRN

## 2018-01-06 MED ORDER — DOCUSATE SODIUM 100 MG PO CAPS: 100.0000 mg | ORAL_CAPSULE | Freq: Two times a day (BID) | ORAL | 0 refills | Status: DC

## 2018-01-06 MED ORDER — ONDANSETRON HCL 4 MG/2ML IJ SOLN: 4.0000 mg | INTRAMUSCULAR | Status: DC | PRN

## 2018-01-06 MED ORDER — HYDROCODONE-ACETAMINOPHEN 5-325 MG PO TABS: 1.0000 | ORAL_TABLET | ORAL | Status: DC | PRN

## 2018-01-06 MED ORDER — POLYETHYLENE GLYCOL 3350 17 G PO PACK
17.0000 g | PACK | Freq: Every day | ORAL | Status: DC | PRN
  Administered 2018-01-07: 17 g via ORAL
  Filled 2018-01-06: qty 1

## 2018-01-06 NOTE — Progress Notes (Addendum)
Subjective: Patient reports "I feel ok, just sleepy"  Objective: Vital signs in last 24 hours: Temp:  [97.7 F (36.5 C)-99.1 F (37.3 C)] 97.7 F (36.5 C) (03/07 0742) Pulse Rate:  [52-79] 63 (03/07 0742) Resp:  [11-18] 16 (03/07 0742) BP: (117-180)/(73-113) 150/94 (03/07 0742) SpO2:  [95 %-98 %] 97 % (03/07 0742) Weight:  [99.5 kg (219 lb 5.7 oz)] 99.5 kg (219 lb 5.7 oz) (03/06 2036)  Intake/Output from previous day: 03/06 0701 - 03/07 0700 In: 628.8 [P.O.:240; I.V.:188.8; IV Piggyback:200] Out: 700 [Urine:700] Intake/Output this shift: No intake/output data recorded.  Resting quietly in chair, awakens to voice. Reports no pain. Cooperates with exam without hesitation, smiling. Drsg intact without drainage or swelling. Periorbital bruising not unexpected - pt reassured. Improved right side strength on Decadron.  Lab Results: Recent Labs    01/04/18 0445  WBC 9.3  HGB 13.7  HCT 40.9  PLT 374   BMET Recent Labs    01/04/18 0445 01/06/18 0254  NA 138 138  K 3.8 3.9  CL 107 104  CO2 23 26  GLUCOSE 149* 152*  BUN 18 18  CREATININE 0.55 0.53  CALCIUM 8.5* 8.4*    Studies/Results: No results found.  Assessment/Plan: stable  LOS: 6 days  Hopeful of CIR. Oncology follow-up as planned. Office f/u with Dr. Vertell Limber in 2 weeks for staple removal. Ok to remove drsg, gently cleanse incision, open to air (bacitracin ointment and Telfa if pt would rather keep site covered) Pt may wash hair/shower from NS perspective.    Verdis Prime 01/06/2018, 10:41 AM   Patient is doing well.  Awaiting pathology results.  I agree with Rehab transfer.  I also agree with continuing decadron at 4 mg BID.

## 2018-01-06 NOTE — Care Management Note (Signed)
Case Management Note  Patient Details  Name: Tara [name] MRN: [id number] Date of Birth: 1951/08/12  Subjective/Objective:                    Action/Plan: Recommendations are for CIR. CM following for d/c disposition.  Expected Discharge Date:                  Expected Discharge Plan:  [place]  In-House Referral:     Discharge planning Services  CM Consult  Post Acute Care Choice:    Choice offered to:     DME Arranged:    DME Agency:     HH Arranged:    [place] Agency:     Status of Service:  In process, will continue to follow  If discussed at Long Length of Stay Meetings, dates discussed:    Additional Comments:  Pollie Friar, RN 01/06/2018, 11:25 AM

## 2018-01-06 NOTE — Discharge Summary (Signed)
Physician Discharge Summary  DIERA WIRKKALA PQZ:[id number] DOB: 09-11-1951 DOA: 12/31/2017  PCP: Patient, No Pcp Per  Admit date: 12/31/2017  Discharge date: 01/06/2018  Admitted From: Home  Disposition:  SNF/Rehab  Recommendations for Outpatient Follow-up:  1. Follow up with PCP in 1-2 weeks 2. Follow up with Oncology and NS as recommended  Home Health:N/A  Equipment/Devices:N/A  Discharge Condition:Stable  CODE STATUS: Full  Diet recommendation: Heart Healthy  Brief/Interim Summary:  Patient is a 67 year old female with history of hypertension, presented to ED with confusion, difficulty finishing sentences since the night before the admission. She also complained of slight left frontal headache. Denied any fevers, chills or reported. CT showed large amount of white matter edema in the left basal ganglia, left temporal lobe concerning for underlying neoplasm, 9 mm of left-to-right midline shift. Patient was admitted for further workup and underwent tracheotomy and brain biopsy on 01/04/2018 and is currently postop day 2. She has been seen by physiatry with recommendations for inpatient rehabilitation which patient agrees to at this time. She will remain on Decadron as well as Keppra twice a day as prescribed. She will follow up with oncology once pathology returns to determine further course of treatment.   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 EEG showed no epileptiform discharges - Neurosurgery was consulted, patient underwent craniotomy and brain biopsy on 01/04/2018, postop day #2 -Follow biopsy, discussed with neuro oncology Dr. Mickeal Skinner for recommendations  - Changed to oral Keppra 500 mg twice a day -To inpatient Rehab today  Active Problems:   Headache, acute metabolic encephalopathy/right-sided hemiparesis: - Due to #1, Right-sided weakness improved, continue IV Decadron    Hypertension -  BP currently stable, continue amlodipine 10 mg daily  - IV hydralazine as needed     Hyperglycemia - CBG stable, hemoglobin A1c 5.9.  - Hyperglycemia likely due to steroids, continue sliding scale insulin    Discharge Instructions  Discharge Instructions    Diet - low sodium heart healthy   Complete by:  As directed    Increase activity slowly   Complete by:  As directed      Allergies as of 01/06/2018   No Known Allergies     Medication List    STOP taking these medications   OVER THE COUNTER MEDICATION     TAKE these medications   acetaminophen 325 MG tablet Commonly known as:  TYLENOL Take 2 tablets (650 mg total) by mouth every 4 (four) hours as needed for mild pain (temp > 100.5).   aspirin-acetaminophen-caffeine 250-250-65 MG tablet Commonly known as:  EXCEDRIN MIGRAINE Take 1 tablet by mouth every 6 (six) hours as needed for headache or migraine.   dexamethasone 4 MG/ML injection Commonly known as:  DECADRON Inject 1 mL (4 mg total) into the vein every 6 (six) hours.   dexamethasone 4 MG/ML injection  Commonly known as:  DECADRON Inject 1 mL (4 mg total) into the vein every 8 (eight) hours. Start taking on:  01/07/2018   docusate sodium 100 MG capsule Commonly known as:  COLACE Take 1 capsule (100 mg total) by mouth 2 (two) times daily.   HYDROcodone-acetaminophen 5-325 MG  tablet Commonly known as:  NORCO/VICODIN Take 1 tablet by mouth every 4 (four) hours as needed for up to 2 days for moderate pain.   labetalol 5 MG/ML injection Commonly known as:  NORMODYNE,TRANDATE Inject 2-8 mLs (10-40 mg total) into the vein every 10 (ten) minutes as needed.   levETIRAcetam 500 MG tablet Commonly known as:  KEPPRA Take 1 tablet (500 mg total) by mouth 2 (two) times daily.   multivitamin with minerals Tabs tablet Take 1 tablet by mouth daily.   ondansetron 4 MG tablet Commonly known as:  ZOFRAN Take 1 tablet (4 mg total) by mouth every 4 (four) hours as needed for nausea or vomiting.       No Known Allergies  Consultations:  Neurosurgery  Oncology   Procedures/Studies: Ct Head Wo Contrast  Result Date: 01/01/2018 CLINICAL DATA:  Slurred speech and right-sided weakness. EXAM: CT HEAD WITHOUT CONTRAST TECHNIQUE: Contiguous axial images were obtained from the base of the skull through the vertex without intravenous contrast. COMPARISON:  Head CT and brain MRI dated 12/31/2017 FINDINGS: Brain: The masslike area of heterogeneous attenuation, primarily hypoattenuation, surrounding the central left middle cerebral artery and extending from the left temporal lobe through the base a ganglia to the left centrum semiovale, is without change from the previous day's exams. There is still mild midline shift to the right currently measuring 4 mm. There are no new areas of abnormal parenchymal attenuation and no new areas of mass effect. No intracranial hemorrhage. Vascular: No hyperdense vessel or unexpected calcification. Skull: Normal. Negative for fracture or focal lesion. Sinuses/Orbits: Globes and orbits are unremarkable. Sinuses and mastoid air cells are clear. Other: None. IMPRESSION: 1. No significant change from the previous day's exams. 2. Masslike area of abnormal attenuation, primarily hypoattenuation, with associated mass effect, on the left, centered on the left  basal ganglia and surrounding the left middle cerebral artery, is unchanged in extent and degree of mass effect. 3. No new abnormalities.  No intracranial hemorrhage. Electronically Signed   By: Lajean Manes M.D.   On: 01/01/2018 11:35   Ct Head Wo Contrast  Result Date: 12/31/2017 CLINICAL DATA:  Altered level of consciousness. EXAM: CT HEAD WITHOUT CONTRAST TECHNIQUE: Contiguous axial images were obtained from the base of the skull through the vertex without intravenous contrast. COMPARISON:  None. FINDINGS: Brain: Large amount of white matter edema is seen in the left basal ganglia and temporal lobe. This is concerning for underlying neoplasm. This results in 9 mm of left-to-right midline shift. No ventricular dilatation is noted. No definite hemorrhage is noted. Vascular: No hyperdense vessel or unexpected calcification. Skull: Normal. Negative for fracture or focal lesion. Sinuses/Orbits: No acute finding. Other: None. IMPRESSION: Large amount of white matter edema is seen in the left basal ganglia and left temporal lobe concerning for underlying neoplasm. This results in 9 mm of left-to-right midline shift. Further evaluation with MRI with and without gadolinium administration is recommended. Electronically Signed   By: Marijo Conception, M.D.   On: 12/31/2017 21:32   Ct Chest W Contrast  Result Date: 01/01/2018 CLINICAL DATA:  Findings worrisome for CNS lymphoma versus glioma brain MRI. For staging. EXAM: CT CHEST,  ABDOMEN, AND PELVIS WITH CONTRAST TECHNIQUE: Multidetector CT imaging of the chest, abdomen and pelvis was performed following the standard protocol during bolus administration of intravenous contrast. CONTRAST:  171mL ISOVUE-300 IOPAMIDOL (ISOVUE-300) INJECTION 61% COMPARISON:  None. FINDINGS: CT CHEST FINDINGS Cardiovascular: Heart is normal in size.  No pericardial effusion. No evidence of thoracic aortic aneurysm. Mediastinum/Nodes: No suspicious mediastinal, hilar, or axillary  lymphadenopathy. Visualized thyroid is unremarkable. Lungs/Pleura: Mild dependent atelectasis in the bilateral lower lobes and posterior left upper lobe. No suspicious pulmonary nodules. No focal consolidation. No pleural effusion or pneumothorax. Musculoskeletal: Visualized osseous structures are within normal limits. CT ABDOMEN PELVIS FINDINGS Hepatobiliary: 13 mm cyst in segment 5 of the liver (series 3/image 56). Layering gallstone (series 3/image 34). No associated inflammatory changes. No intrahepatic or extrahepatic ductal dilatation. Pancreas: Within normal limits. Spleen: Spleen is normal in size. Adrenals/Urinary Tract: Adrenal glands are within normal limits. 3 mm nonobstructing left lower pole renal calculus (series 3/image 70). Multiple left renal cysts, measuring up to 5.1 cm in the posterior left lower kidney (series 3/image 69), measuring simple fluid density (Bosniak I). Right kidney is within normal limits. No hydronephrosis. Bladder is within normal limits. Stomach/Bowel: Stomach is notable for a tiny hiatal hernia. No evidence of bowel obstruction. Normal appendix (series 3/image 92). Mild sigmoid diverticulosis, without evidence of diverticulitis. Vascular/Lymphatic: No evidence of abdominal aortic aneurysm. Atherosclerotic calcifications of the abdominal aorta and branch vessels. No suspicious abdominopelvic lymphadenopathy. Reproductive: Uterus is within normal limits. Bilateral ovaries are within normal limits. Other: No abdominopelvic ascites. Musculoskeletal: Mild degenerative changes of the lumbar spine. IMPRESSION: No findings suspicious for malignancy (primary or metastatic) in the chest, abdomen, or pelvis. No suspicious lymphadenopathy.  Spleen is normal in size. 3 mm nonobstructing left lower pole renal calculus. Multiple left renal cysts, benign (Bosniak I). Cholelithiasis, without associated inflammatory changes. Electronically Signed   By: Julian Hy M.D.   On: 01/01/2018  09:12   Mr Jeri Cos JE Contrast  Result Date: 01/03/2018 CLINICAL DATA:  Primary CNS lymphoma.  Surgical planning EXAM: MRI HEAD WITHOUT AND WITH CONTRAST TECHNIQUE: Multiplanar, multiecho pulse sequences of the brain and surrounding structures were obtained without and with intravenous contrast. CONTRAST:  33mL MULTIHANCE GADOBENATE DIMEGLUMINE 529 MG/ML IV SOLN COMPARISON:  MRI head 12/31/2017, CT head 12/31/2017 FINDINGS: Brain: Large area of edema surrounding the left sylvian fissure similar to the prior MRI. This involves much of the temporal lobe as well as the left posterior frontal lobe, and the left frontal and operculum and left parietal operculum. The prior MRI demonstrated enhancement along the left middle cerebral artery in the sylvian fissure most compatible with tumor. Negative for hemorrhage. Contrast not administered today. Mild mass-effect on the left lateral ventricle with 4 mm midline shift to the right. 10 x 15 mm focus of restricted diffusion left periventricular white matter and basal ganglia is new since the prior study, consistent with acute infarction. Small hyperintensity in the right parietal white matter unchanged. 1 cm lesion in the left cerebellum hyperintense on T2 with facilitated diffusion also unchanged. Vascular: Normal arterial flow voids. Skull and upper cervical spine: Negative Sinuses/Orbits: Negative Other: None IMPRESSION: Large area of edema in the left cerebral white matter similar to the recent MRI. Contrast not administered today however abnormal enhancement along the left middle cerebral artery in the sylvian fissure is seen previously, most likely tumor. 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 4 mm midline shift to the right unchanged. 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 Small hyperintensities left cerebellum and right periventricular white matter likely due to chronic ischemia. These are unchanged. Electronically Signed   By: Franchot Gallo M.D.    On: 01/03/2018 16:47   Mr Brain W And Wo Contrast  Result Date: 01/01/2018 CLINICAL DATA:  Confusion and mass demonstrated on earlier head CT EXAM: MRI HEAD WITHOUT AND WITH CONTRAST MRA HEAD WITHOUT CONTRAST TECHNIQUE: Multiplanar, multiecho pulse sequences of the brain and surrounding structures were obtained without and with intravenous contrast. Angiographic images of the head were obtained using MRA technique without contrast. CONTRAST:  22mL MULTIHANCE GADOBENATE DIMEGLUMINE 529 MG/ML IV SOLN COMPARISON:  Head CT 12/31/2017 FINDINGS: MRI HEAD FINDINGS Brain: The midline structures are normal. No focal diffusion restriction to indicate acute infarct. No intraparenchymal hemorrhage. Large amount of hyperintense T2-weighted signal in the left basal ganglia, left insula and left temporal lobe. There is an area of contrast enhancement within the brain parenchyma following the left middle cerebral artery M1 and proximal M2 segments. The largest confluent area of enhancement measures 2.2 x 1.3 cm. Rightward midline shift of 6 mm is unchanged. No chronic microhemorrhage or cerebral amyloid angiopathy. No hydrocephalus, age advanced atrophy or lobar predominant volume loss. No dural abnormality or extra-axial collection. Skull and upper cervical spine: The visualized skull base, calvarium, upper cervical spine and extracranial soft tissues are normal. Sinuses/Orbits: No fluid levels or advanced mucosal thickening. No mastoid effusion. Normal orbits. MRA HEAD FINDINGS Intracranial internal carotid arteries: Normal. Anterior cerebral arteries: Normal. Middle cerebral arteries: Normal. Posterior communicating arteries: Present on the right. Posterior cerebral arteries: Normal. Fetal origin on the right. Basilar artery: Normal. Vertebral arteries: Left dominant. Normal. Superior cerebellar arteries: Normal. Anterior inferior cerebellar arteries: Normal. Posterior inferior cerebellar arteries: Normal. IMPRESSION: 1.  Masslike parenchymal contrast enhancement adjacent to the left MCA M1 and proximal M2 segments with large area of surrounding hyperintense T2-weighted signal. The appearance is concerning for lymphoma -- including the intravascular variant -- with a large amount of surrounding edema. A primary CNS neoplasm, most likely a high grade glioma, is also a possibility, in which case the abnormal T2WI hyperintensity could indicate edema or non-enhancing tumor. Vasculitis is less likely in the context of normal MRA, but remains a consideration (MRA is less sensitive for detecting findings of vasculitis than CTA or conventional angiography). 2. Normal MRA. 3. Unchanged 6 mm rightward midline shift. Electronically Signed   By: Ulyses Jarred M.D.   On: 01/01/2018 00:39   Ct Abdomen Pelvis W Contrast  Result Date: 01/01/2018 CLINICAL DATA:  Findings worrisome for CNS lymphoma versus glioma brain MRI. For staging. EXAM: CT CHEST, ABDOMEN, AND PELVIS WITH CONTRAST TECHNIQUE: Multidetector CT imaging of the chest, abdomen and pelvis was performed following the standard protocol during bolus administration of intravenous contrast. CONTRAST:  16mL ISOVUE-300 IOPAMIDOL (ISOVUE-300) INJECTION 61% COMPARISON:  None. FINDINGS: CT CHEST FINDINGS Cardiovascular: Heart is normal in size.  No pericardial effusion. No evidence of thoracic aortic aneurysm. Mediastinum/Nodes: No suspicious mediastinal, hilar, or axillary lymphadenopathy. Visualized thyroid is unremarkable. Lungs/Pleura: Mild dependent atelectasis in the bilateral lower lobes and posterior left upper lobe. No suspicious pulmonary nodules. No focal consolidation. No pleural effusion or pneumothorax. Musculoskeletal: Visualized osseous structures are within normal limits. CT ABDOMEN PELVIS FINDINGS Hepatobiliary: 13 mm cyst in segment 5 of the liver (series 3/image 56). Layering gallstone (series 3/image 34). No associated inflammatory changes. No intrahepatic or extrahepatic  ductal dilatation. Pancreas: Within normal limits. Spleen: Spleen is normal in  size. Adrenals/Urinary Tract: Adrenal glands are within normal limits. 3 mm nonobstructing left lower pole renal calculus (series 3/image 70). Multiple left renal cysts, measuring up to 5.1 cm in the posterior left lower kidney (series 3/image 69), measuring simple fluid density (Bosniak I). Right kidney is within normal limits. No hydronephrosis. Bladder is within normal limits. Stomach/Bowel: Stomach is notable for a tiny hiatal hernia. No evidence of bowel obstruction. Normal appendix (series 3/image 92). Mild sigmoid diverticulosis, without evidence of diverticulitis. Vascular/Lymphatic: No evidence of abdominal aortic aneurysm. Atherosclerotic calcifications of the abdominal aorta and branch vessels. No suspicious abdominopelvic lymphadenopathy. Reproductive: Uterus is within normal limits. Bilateral ovaries are within normal limits. Other: No abdominopelvic ascites. Musculoskeletal: Mild degenerative changes of the lumbar spine. IMPRESSION: No findings suspicious for malignancy (primary or metastatic) in the chest, abdomen, or pelvis. No suspicious lymphadenopathy.  Spleen is normal in size. 3 mm nonobstructing left lower pole renal calculus. Multiple left renal cysts, benign (Bosniak I). Cholelithiasis, without associated inflammatory changes. Electronically Signed   By: Julian Hy M.D.   On: 01/01/2018 09:12   Mr Jodene Nam Head (cerebral Arteries)  Result Date: 01/01/2018 CLINICAL DATA:  Confusion and mass demonstrated on earlier head CT EXAM: MRI HEAD WITHOUT AND WITH CONTRAST MRA HEAD WITHOUT CONTRAST TECHNIQUE: Multiplanar, multiecho pulse sequences of the brain and surrounding structures were obtained without and with intravenous contrast. Angiographic images of the head were obtained using MRA technique without contrast. CONTRAST:  78mL MULTIHANCE GADOBENATE DIMEGLUMINE 529 MG/ML IV SOLN COMPARISON:  Head CT 12/31/2017  FINDINGS: MRI HEAD FINDINGS Brain: The midline structures are normal. No focal diffusion restriction to indicate acute infarct. No intraparenchymal hemorrhage. Large amount of hyperintense T2-weighted signal in the left basal ganglia, left insula and left temporal lobe. There is an area of contrast enhancement within the brain parenchyma following the left middle cerebral artery M1 and proximal M2 segments. The largest confluent area of enhancement measures 2.2 x 1.3 cm. Rightward midline shift of 6 mm is unchanged. No chronic microhemorrhage or cerebral amyloid angiopathy. No hydrocephalus, age advanced atrophy or lobar predominant volume loss. No dural abnormality or extra-axial collection. Skull and upper cervical spine: The visualized skull base, calvarium, upper cervical spine and extracranial soft tissues are normal. Sinuses/Orbits: No fluid levels or advanced mucosal thickening. No mastoid effusion. Normal orbits. MRA HEAD FINDINGS Intracranial internal carotid arteries: Normal. Anterior cerebral arteries: Normal. Middle cerebral arteries: Normal. Posterior communicating arteries: Present on the right. Posterior cerebral arteries: Normal. Fetal origin on the right. Basilar artery: Normal. Vertebral arteries: Left dominant. Normal. Superior cerebellar arteries: Normal. Anterior inferior cerebellar arteries: Normal. Posterior inferior cerebellar arteries: Normal. IMPRESSION: 1. Masslike parenchymal contrast enhancement adjacent to the left MCA M1 and proximal M2 segments with large area of surrounding hyperintense T2-weighted signal. The appearance is concerning for lymphoma -- including the intravascular variant -- with a large amount of surrounding edema. A primary CNS neoplasm, most likely a high grade glioma, is also a possibility, in which case the abnormal T2WI hyperintensity could indicate edema or non-enhancing tumor. Vasculitis is less likely in the context of normal MRA, but remains a consideration  (MRA is less sensitive for detecting findings of vasculitis than CTA or conventional angiography). 2. Normal MRA. 3. Unchanged 6 mm rightward midline shift. Electronically Signed   By: Ulyses Jarred M.D.   On: 01/01/2018 00:39     Discharge Exam: Vitals:   01/06/18 0742 01/06/18 1141  BP: (!) 150/94 (!) 152/78  Pulse: 63 63  Resp:  16 16  Temp: 97.7 F (36.5 C) 97.8 F (36.6 C)  SpO2: 97% 96%   Vitals:   01/06/18 0130 01/06/18 0428 01/06/18 0742 01/06/18 1141  BP: (!) 150/77 (!) 154/81 (!) 150/94 (!) 152/78  Pulse: (!) 52 (!) 57 63 63  Resp:  18 16 16   Temp:  98.1 F (36.7 C) 97.7 F (36.5 C) 97.8 F (36.6 C)  TempSrc:  Oral Oral Axillary  SpO2:  98% 97% 96%  Weight:      Height:        General: Pt is alert, awake, not in acute distress Cardiovascular: RRR, S1/S2 +, no rubs, no gallops Respiratory: CTA bilaterally, no wheezing, no rhonchi Abdominal: Soft, NT, ND, bowel sounds + Extremities: no edema, no cyanosis    The results of significant diagnostics from this hospitalization (including imaging, microbiology, ancillary and laboratory) are listed below for reference.     Microbiology: Recent Results (from the past 240 hour(s))  Urine Culture     Status: Abnormal   Collection Time: 12/31/17  1:45 AM  Result Value Ref Range Status   Specimen Description URINE, RANDOM  Final   Special Requests   Final    NONE Performed at [facility], [address], [place], [place] [address]    Culture MULTIPLE SPECIES PRESENT, SUGGEST RECOLLECTION (A)  Final   Report Status 01/02/2018 FINAL  Final     Labs: BNP (last 3 results) No results for input(s): BNP in the last 8760 hours. Basic Metabolic Panel: Recent Labs  Lab 01/01/18 0209 01/02/18 0500 01/03/18 0354 01/04/18 0445 01/06/18 0254  NA 133* 136 140 138 138  K 3.8 3.4* 3.7 3.8 3.9  CL 101 105 108 107 104  CO2 21* 20* 22 23 26   GLUCOSE 147* 161* 149* 149* 152*  BUN 15 16 18 18 18   CREATININE 0.62  0.52 0.55 0.55 0.53  CALCIUM 9.3 9.3 8.8* 8.5* 8.4*  MG  --  1.9 2.2  --   --    Liver Function Tests: Recent Labs  Lab 12/31/17 1638 01/01/18 0209 01/03/18 0354  AST 15 15 19   ALT 14 17 21   ALKPHOS 118 113 88  BILITOT 0.7 0.6 0.7  PROT 7.2 6.5 6.2*  ALBUMIN 4.1 3.7 3.4*   No results for input(s): LIPASE, AMYLASE in the last 168 hours. No results for input(s): AMMONIA in the last 168 hours. CBC: Recent Labs  Lab 12/31/17 1638 12/31/17 2204 01/01/18 0209 01/02/18 0500 01/03/18 0354 01/04/18 0445  WBC 9.4  --  11.3* 15.2* 10.5 9.3  NEUTROABS 7.9*  --   --   --  9.6*  --   HGB 15.3* 13.9 14.0 15.4* 14.0 13.7  HCT 45.3 41.0 41.0 44.1 42.1 40.9  MCV 93.4  --  92.3 91.3 93.8 93.0  PLT 424*  --  378 421* 391 374   Cardiac Enzymes: No results for input(s): CKTOTAL, CKMB, CKMBINDEX, TROPONINI in the last 168 hours. BNP: Invalid input(s): POCBNP CBG: Recent Labs  Lab 01/05/18 1207 01/05/18 1534 01/05/18 2119 01/06/18 0603 01/06/18 1137  GLUCAP 141* 206* 120* 139* 128*   D-Dimer No results for input(s): DDIMER in the last 72 hours. Hgb A1c No results for input(s): HGBA1C in the last 72 hours. Lipid Profile No results for input(s): CHOL, HDL, LDLCALC, TRIG, CHOLHDL, LDLDIRECT in the last 72 hours. Thyroid function studies No results for input(s): TSH, T4TOTAL, T3FREE, THYROIDAB in the last 72 hours.  Invalid input(s): FREET3 Anemia work  up No results for input(s): VITAMINB12, FOLATE, FERRITIN, TIBC, IRON, RETICCTPCT in the last 72 hours. Urinalysis    Component Value Date/Time   COLORURINE YELLOW 01/01/2018 0152   APPEARANCEUR HAZY (A) 01/01/2018 0152   LABSPEC 1.024 01/01/2018 0152   PHURINE 5.0 01/01/2018 0152   GLUCOSEU NEGATIVE 01/01/2018 0152   HGBUR LARGE (A) 01/01/2018 0152   BILIRUBINUR NEGATIVE 01/01/2018 0152   KETONESUR NEGATIVE 01/01/2018 0152   PROTEINUR NEGATIVE 01/01/2018 0152   NITRITE NEGATIVE 01/01/2018 0152   LEUKOCYTESUR MODERATE (A)  01/01/2018 0152   Sepsis Labs Invalid input(s): PROCALCITONIN,  WBC,  LACTICIDVEN Microbiology Recent Results (from the past 240 hour(s))  Urine Culture     Status: Abnormal   Collection Time: 12/31/17  1:45 AM  Result Value Ref Range Status   Specimen Description URINE, RANDOM  Final   Special Requests   Final    NONE Performed at [facility], [place] [address], [place], [place] [address]    Culture MULTIPLE SPECIES PRESENT, SUGGEST RECOLLECTION (A)  Final   Report Status 01/02/2018 FINAL  Final     Time coordinating discharge: Over 30 minutes  SIGNED:   Rodena Goldmann, DO [facility] 01/06/2018, 1:12 PM Pager [phone]  If 7PM-7AM, please contact night-coverage [email or web address] Password [email or web address]

## 2018-01-06 NOTE — Evaluation (Signed)
Physical Therapy Evaluation Patient Details Name: Tara [name] MRN: [id number] DOB: Jul 08, 1951 Today's Date: 01/06/2018   History of Present Illness  Patient is a 67 y/o female who presents with speech difficulties and right sided weakness. 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 s/p crani and biopsy. 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 No PMH.  Clinical Impression  Patient presents with mild right sided weakness/impaired sensation, right inattention, ?visual deficits, decreased memory, impaired attention, awareness and problem solving s/p above. Pt independent PTA and has supportive family. Tolerated gait training with Min A for balance/safety. Pt with significant cognitive deficits noted today with poor awareness. Running into walls/objects on right side throughout session without any correcting. Difficulty with dual tasking with LOB when trying to perform cognitive tasks. Pt not able to find way back to room or remember room number without cues. Recommend CIR to maximize independence and mobility prior to return home so pt can return to PLOF and be able to care for new grandbaby. Recommend speech consult. Will follow.     Follow Up Recommendations CIR    Equipment Recommendations  None recommended by PT    Recommendations for Other Services Rehab consult;Speech consult     Precautions / Restrictions Precautions Precautions: Fall Precaution Comments: right inattention Restrictions Weight Bearing Restrictions: No      Mobility  Bed Mobility Overal bed mobility: Needs Assistance Bed Mobility: Supine to Sit     Supine to sit: Supervision;HOB elevated     General bed mobility comments: No assist needed. Use of rail. No dizziness.  Transfers Overall transfer level: Needs assistance Equipment used: None Transfers: Sit to/from Stand Sit to Stand: Min guard;Min assist         General transfer comment: Min guard for  safety. Pt with LOB initially in standing but able to catch self. Stood from [employer] from toilet x1.   Ambulation/Gait Ambulation/Gait assistance: Min assist Ambulation Distance (Feet): 120 Feet Assistive device: None Gait Pattern/deviations: Step-through pattern;Staggering right;Narrow base of support;Decreased stride length;Drifts right/left Gait velocity: decreased Gait velocity interpretation: Below normal speed for age/gender General Gait Details: Slow, guarded and unsteady gait with decreased arm swing and BUEs reaching for support; running into environment on right side- no awareness. LOB with dual tasking and cognitive tasks.   Stairs Stairs: Yes Stairs assistance: Min guard Stair Management: Step to pattern;Alternating pattern;Two rails Number of Stairs: 3(+ 2 steps x2 bouts) General stair comments: Cues for safety/technique.  Wheelchair Mobility    Modified Rankin (Stroke Patients Only) Modified Rankin (Stroke Patients Only) Pre-Morbid Rankin Score: No symptoms Modified Rankin: Moderately severe disability     Balance Overall balance assessment: Needs assistance Sitting-balance support: Feet supported;No upper extremity supported Sitting balance-Leahy Scale: Good Sitting balance - Comments: Able to reach outside BoS and doff/donn socks without LOB.   Standing balance support: During functional activity Standing balance-Leahy Scale: Fair Standing balance comment: Able to perform tasks at sink with Min guard-Min A. LOB when turning in bathroom.                             Pertinent Vitals/Pain Pain Assessment: No/denies pain    Home Living Family/patient expects to be discharged to:: Private residence Living Arrangements: Spouse/significant other Available Help at Discharge: Family;Available PRN/intermittently Type of Home: House Home Access: Stairs to enter Entrance Stairs-Rails: None Entrance Stairs-Number of Steps: 1 Home Layout: Two level;Able  to  live on main level with bedroom/bathroom Home Equipment: None      Prior Function Level of Independence: Independent         Comments: Drives. Not working. Spouse works during the day but can stay home as needed.      Hand Dominance   Dominant Hand: Right    Extremity/Trunk Assessment   Upper Extremity Assessment Upper Extremity Assessment: Defer to OT evaluation;RUE deficits/detail RUE Deficits / Details: Grossly ~4/5 throughout RUE Sensation: decreased light touch    Lower Extremity Assessment Lower Extremity Assessment: RLE deficits/detail RLE Deficits / Details: Grossly ~4/5 throughout.  RLE Sensation: decreased light touch    Cervical / Trunk Assessment Cervical / Trunk Assessment: Normal  Communication   Communication: Expressive difficulties  Cognition Arousal/Alertness: Awake/alert Behavior During Therapy: WFL for tasks assessed/performed Overall Cognitive Status: Impaired/Different from baseline Area of Impairment: Orientation;Memory;Safety/judgement;Awareness;Problem solving;Attention;Following commands                 Orientation Level: Disoriented to;Time;Situation Current Attention Level: Selective Memory: Decreased short-term memory Following Commands: Follows multi-step commands inconsistently;Follows multi-step commands with increased time Safety/Judgement: Decreased awareness of deficits;Decreased awareness of safety Awareness: Intellectual Problem Solving: Slow processing;Requires verbal cues General Comments: Able to get to March with contextual cues; not year. Does not know why she is in the hospital. Tangential speech not always relating to questions asked. Not able to recall info within session. Difficulty with dual tasking- not able to count down from 100s by 5 without LOB. Right inattention noted. Decreased awareness of deficits. "I just have not been up in awhile."      General Comments General comments (skin integrity, edema,  etc.): Children present during session.     Exercises     Assessment/Plan    PT Assessment Patient needs continued PT services  PT Problem List Decreased strength;Decreased mobility;Decreased safety awareness;Decreased cognition;Decreased balance;Impaired sensation       PT Treatment Interventions Functional mobility training;Balance training;Patient/family education;Gait training;Therapeutic activities;Neuromuscular re-education;Cognitive remediation;Stair training;Therapeutic exercise    PT Goals (Current goals can be found in the Care Plan section)  Acute Rehab PT Goals Patient Stated Goal: to get home and be able to care for grandbaby coming in April PT Goal Formulation: With patient Time For Goal Achievement: 01/20/18 Potential to Achieve Goals: Good    Frequency Min 4X/week   Barriers to discharge Decreased caregiver support spouse works but can take time off    Co-evaluation               AM-PAC PT "6 Clicks" Daily Activity  Outcome Measure Difficulty turning over in bed (including adjusting bedclothes, sheets and blankets)?: None Difficulty moving from lying on back to sitting on the side of the bed? : None Difficulty sitting down on and standing up from a chair with arms (e.g., wheelchair, bedside commode, etc,.)?: A Little Help needed moving to and from a bed to chair (including a wheelchair)?: A Little Help needed walking in hospital room?: A Little Help needed climbing 3-5 steps with a railing? : A Little 6 Click Score: 20    End of Session Equipment Utilized During Treatment: Gait belt Activity Tolerance: Patient tolerated treatment well Patient left: in bed;with call bell/phone within reach;Other (comment);with family/visitor present(with OT present in room) Nurse Communication: Mobility status PT Visit Diagnosis: Hemiplegia and hemiparesis;Difficulty in walking, not elsewhere classified (R26.2);Other symptoms and signs involving the nervous system  (R29.898);Unsteadiness on feet (R26.81) Hemiplegia - Right/Left: Right Hemiplegia - dominant/non-dominant: Dominant Hemiplegia - caused by: Other  cerebrovascular disease    Time: 0841-0909 PT Time Calculation (min) (ACUTE ONLY): 28 min   Charges:   PT Evaluation $PT Eval Moderate Complexity: 1 Mod PT Treatments $Gait Training: 8-22 mins   PT G Codes:        Tara [name], PT, DPT [phone]    Tara [name] 01/06/2018, 9:23 AM

## 2018-01-06 NOTE — Care Management Important Message (Signed)
Important Message  Patient Details  Name: Tara [name] MRN: [id number] Date of Birth: 1951/09/29   Medicare Important Message Given:  Yes    [name] 01/06/2018, 10:14 AM

## 2018-01-06 NOTE — PMR Pre-admission (Signed)
PMR Admission Coordinator Pre-Admission Assessment  Patient: Tara [name] is an 67 y.o., female MRN: [id number] DOB: 1951/09/14 Height: 5\' 6"  (167.6 cm) Weight: 99.5 kg (219 lb 5.7 oz)              Insurance Information HMO:     PPO:      PCP:      IPA:      80/20: yes     OTHER: no HMO PRIMARY: Medicare a and b      Policy#: [id number]      Subscriber: pt Benefits:  Phone #: passport one online     Name: 01/06/18 Eff. Date: 04/02/2016     Deduct: $1364      Out of Pocket Max: none      Life Max: none CIR: 100%      SNF: 20 full days Outpatient: 80%     Co-Pay: 20% Home Health: 100%      Co-Pay: none DME: 80%     Co-Pay: 20% Providers: pt choice  SECONDARY: none      Medicaid Application Date:       Case Manager:  Disability Application Date:       Case Worker:   Emergency Contact Information Contact Information    Name Relation Home Work Mobile   Punt,CLIFFORD  [phone]  [phone]   Valentino Saxon   [phone]     Current Medical History  Patient Admitting Diagnosis: left frontal mass, Left CVA  History of Present Illness:  HPI: Tara J Matthewsis a 67 y.o.right handed femalewith history of tobacco abuse on no prescription medications.  Presented 12/31/2017 with confusion and slurred speech as well as left frontal headache and right side weakness. CT/MRI demonstrated enhancing lesion with surrounding T2 signal consistent with likely mass. CT the chest with no finding suspicious for malignancy. CT abdomen pelvis unremarkable. Noted seizure 01/02/2018 loaded with Keppra. EEG suggestive for mild generalized cerebral dysfunction no seizure. 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 Large area of edema in the left cerebral white matter similar to the recent MRI. 4 mm shift to the right unchanged. Underwent left craniotomy as well as biopsy for brain mass 01/04/2018 per Dr. Vertell Limber.  Medical oncology consulted preliminary frozen path pathology non diagnostic await formal pathology report. Plan suggesting aggressive chemotherapy. Decadron protocol as indicated. Tolerating a soft diet.   Total: 2 NIHSS    Past Medical History  History reviewed. No pertinent past medical history.  Family History  family history includes CAD in her father; Colon cancer in her mother.  Prior Rehab/Hospitalizations:  Has the patient had major surgery during 100 days prior to admission? No  Current Medications   Current Facility-Administered Medications:  .  0.9 % NaCl with KCl 20 mEq/ L  infusion, , Intravenous, Continuous, Erline Levine, MD, Stopped at 01/05/18 [phone] .  acetaminophen (TYLENOL) tablet 650 mg, 650 mg, Oral, Q4H PRN **OR** acetaminophen (TYLENOL) suppository 650 mg, 650 mg, Rectal, Q4H PRN, Erline Levine, MD .  bisacodyl (DULCOLAX) suppository 10 mg, 10 mg, Rectal, Daily PRN, Erline Levine, MD .  Margrett Rud dexamethasone (DECADRON) injection 6 mg, 6 mg, Intravenous, Q6H, 6 mg at 01/05/18 1713 **FOLLOWED BY** dexamethasone (DECADRON) injection 4 mg, 4 mg, Intravenous, Q6H, 4 mg at 01/06/18 1140 **FOLLOWED BY** [START ON 01/07/2018] dexamethasone (DECADRON) injection 4 mg, 4 mg, Intravenous, Q8H, Erline Levine, MD .  docusate sodium (COLACE) capsule 100 mg, 100  mg, Oral, BID, Erline Levine, MD, 100 mg at 01/06/18 1139 .  HYDROcodone-acetaminophen (NORCO/VICODIN) 5-325 MG per tablet 1 tablet, 1 tablet, Oral, Q4H PRN, Erline Levine, MD .  labetalol (NORMODYNE,TRANDATE) injection 10-40 mg, 10-40 mg, Intravenous, Q10 min PRN, Erline Levine, MD, 10 mg at 01/06/18 0031 .  levETIRAcetam (KEPPRA) tablet 500 mg, 500 mg, Oral, BID, Rai, Ripudeep K, MD, 500 mg at 01/06/18 1139 .  morphine 4 MG/ML injection 2 mg, 2 mg, Intravenous, Q2H PRN, Erline Levine, MD .  ondansetron [facility]) tablet 4 mg, 4 mg, Oral, Q4H PRN **OR** ondansetron (ZOFRAN) injection 4 mg, 4 mg, Intravenous, Q4H PRN, Erline Levine, MD .  pantoprazole (PROTONIX) injection 40 mg, 40 mg, Intravenous, Graciela Husbands, MD, 40 mg at 01/05/18 2234 .  polyethylene glycol (MIRALAX / GLYCOLAX) packet 17 g, 17 g, Oral, Daily PRN, Erline Levine, MD .  promethazine (PHENERGAN) tablet 12.5-25 mg, 12.5-25 mg, Oral, Q4H PRN, Erline Levine, MD .  sodium phosphate (FLEET) 7-19 GM/118ML enema 1 enema, 1 enema, Rectal, Once PRN, Erline Levine, MD  Patients Current Diet: DIET SOFT Room service appropriate? Yes; Fluid consistency: Thin Diet - low sodium heart healthy  Precautions / Restrictions Precautions Precautions: Fall Precaution Comments: right inattention Restrictions Weight Bearing Restrictions: No   Has the patient had 2 or more falls or a fall with injury in the past year?No  Prior Activity Level Community (5-7x/wk): INdependent and driving pta  Development worker, international aid / Equipment Home Assistive Devices/Equipment: None Home Equipment: None  Prior Device Use: Indicate devices/aids used by the patient prior to current illness, exacerbation or injury? None of the above  Prior Functional Level Prior Function Level of Independence: Independent Comments: Drives. Not working. Spouse works during the day but can stay home as needed.   Self Care: Did the patient need help bathing, dressing, using the toilet or eating?  Independent  Indoor Mobility: Did the patient need assistance with walking from room to room (with or without device)? Independent  Stairs: Did the patient need assistance with internal or external stairs (with or without device)? Independent  Functional Cognition: Did the patient need help planning regular tasks such as shopping or remembering to take medications? Independent  Current Functional Level Cognition  Overall Cognitive Status: Impaired/Different from baseline Current Attention Level: Selective Orientation Level: Oriented to place, Oriented to person Following Commands: Follows  multi-step commands inconsistently, Follows multi-step commands with increased time Safety/Judgement: Decreased awareness of deficits, Decreased awareness of safety General Comments: pt initially able to state correct day, though when having pt read aloud food items from menu for today (Thursday) pt reading items for Friday and requires cues to recall that it is Tuesday. Pt able to read clock however does not correctly state time, instead reading minute and second numbers located closest to clock hands    Extremity Assessment (includes Sensation/Coordination)  Upper Extremity Assessment: RUE deficits/detail RUE Deficits / Details: Grossly ~4/5 throughout; decreased smoothness during finger to nose requiring increased time/effort   RUE Sensation: decreased light touch RUE Coordination: decreased fine motor  Lower Extremity Assessment: Defer to PT evaluation RLE Deficits / Details: Grossly ~4/5 throughout.  RLE Sensation: decreased light touch    ADLs  Overall ADL's : Needs assistance/impaired Eating/Feeding: Supervision/ safety, Set up, Sitting Grooming: Minimal assistance, Standing, Wash/dry face, Oral care Grooming Details (indicate cue type and reason): min steadying assist while standing at sink; verbal cues to initiate, follow through and to terminate tasks  Lower Body Bathing: Sit to/from  stand, Min guard Upper Body Dressing : Min guard, Sitting Lower Body Dressing: Minimal assistance, Sit to/from stand Lower Body Dressing Details (indicate cue type and reason): pt able to reach and adjust socks seated EOB  Toilet Transfer: Minimal assistance, Ambulation, Regular Toilet, Grab bars Toileting- Clothing Manipulation and Hygiene: Minimal assistance, Sit to/from stand Functional mobility during ADLs: Minimal assistance General ADL Comments: handoff from PT at start of session with Pt standing at sink to complete grooming ADLs; pt completing room level functional mobility with MinA      Mobility  Overal bed mobility: Needs Assistance Bed Mobility: Supine to Sit Supine to sit: Supervision, HOB elevated General bed mobility comments: OOB with PT     Transfers  Overall transfer level: Needs assistance Equipment used: None Transfers: Sit to/from Stand Sit to Stand: Min guard General transfer comment: MinGuard from EOB for safety    Ambulation / Gait / Stairs / Wheelchair Mobility  Ambulation/Gait Ambulation/Gait assistance: Museum/gallery curator (Feet): 120 Feet Assistive device: None Gait Pattern/deviations: Step-through pattern, Staggering right, Narrow base of support, Decreased stride length, Drifts right/left General Gait Details: Slow, guarded and unsteady gait with decreased arm swing and BUEs reaching for support; running into environment on right side- no awareness. LOB with dual tasking and cognitive tasks.  Gait velocity: decreased Gait velocity interpretation: Below normal speed for age/gender Stairs: Yes Stairs assistance: Min guard Stair Management: Step to pattern, Alternating pattern, Two rails Number of Stairs: 3(+ 2 steps x2 bouts) General stair comments: Cues for safety/technique.    Posture / Balance Dynamic Sitting Balance Sitting balance - Comments: Able to reach outside BoS and adjust socks without LOB. Balance Overall balance assessment: Needs assistance Sitting-balance support: Feet supported, No upper extremity supported Sitting balance-Leahy Scale: Good Sitting balance - Comments: Able to reach outside BoS and adjust socks without LOB. Standing balance support: During functional activity Standing balance-Leahy Scale: Fair Standing balance comment: Able to perform tasks at sink with Min guard-Min A. LOB with PT when turning in bathroom.    Special needs/care consideration BiPAP/CPAP  N/a CPM  N/a Continuous Drip IV  N/a Dialysis  N/a Life Vest  N/a Oxygen  N/a Special Bed  N/a Trach Size  N/a Wound Vac n/a Skin  surgical incision with skin glu; ecchymosis and swelling to surgical area and left eye Bowel mgmt: continent LBM 3/5 Bladder mgmt:continent Diabetic mgmt  N/a   Previous Home Environment Living Arrangements: Spouse/significant other  Lives With: Spouse Available Help at Discharge: Family, Available 24 hours/day(spouse owns his own buisiness and can stay at home) Type of Home: House Home Layout: Two level, Able to live on main level with bedroom/bathroom Home Access: Stairs to enter Entrance Stairs-Rails: None Entrance Stairs-Number of Steps: 1 Bathroom Shower/Tub: Optometrist: Yes How Accessible: Accessible via walker [place]: No  Discharge Living Setting Plans for Discharge Living Setting: Patient's home, Lives with (comment)(spouse) Type of Home at Discharge: House Discharge Home Layout: Two level, Able to live on main level with bedroom/bathroom Discharge Home Access: Stairs to enter Entrance Stairs-Rails: None Entrance Stairs-Number of Steps: 1 Discharge Bathroom Shower/Tub: Tub/shower unit Discharge Bathroom Toilet: Standard Discharge Bathroom Accessibility: Yes How Accessible: Accessible via walker Does the patient have any problems obtaining your medications?: No  Social/Family/Support Systems Patient Roles: Spouse, Parent Contact Information: son, [name], easier than spouse "Bud" to get up with as needed Anticipated Caregiver: family Anticipated Caregiver's Contact Information: see above, son, [name] Ability/Limitations of  Caregiver: no limitations Caregiver Availability: 24/7 Discharge Plan Discussed with Primary Caregiver: Yes Is Caregiver In Agreement with Plan?: Yes Does Caregiver/Family have Issues with Lodging/Transportation while Pt is in Rehab?: No  Goals/Additional Needs Patient/Family Goal for Rehab: MOd I to supervision to min assist with PT, OT, and SLP Expected length of stay: [facility] 7 to 10  days Pt/Family Agrees to Admission and willing to participate: Yes Program Orientation Provided & Reviewed with Pt/Caregiver Including Roles  & Responsibilities: Yes  Decrease burden of Care through IP rehab admission: n/a  Possible need for SNF placement upon discharge:not anticipated  Patient Condition: This patient's condition remains as documented in the consult dated 01/06/2017, in which the Rehabilitation Physician determined and documented that the patient's condition is appropriate for intensive rehabilitative care in an inpatient rehabilitation facility. Will admit to inpatient rehab today.  Preadmission Screen Completed By:  Cleatrice Burke, 01/06/2018 1:11 PM ______________________________________________________________________   Discussed status with Dr. Naaman Plummer on 01/06/2018 at  1317 and received telephone approval for admission today.  Admission Coordinator:  Cleatrice Burke, time 8110 Date 01/06/2018

## 2018-01-06 NOTE — Consult Note (Signed)
Physical Medicine and Rehabilitation Consult Reason for Consult: Right sided weakness and slurred speech Referring Physician: [facility]   HPI: Tara [name] is a 67 y.o. right handed female with history of tobacco abuse on no prescription medications. Per chart review patient lives with spouse and was independent prior to admission. Two-level home with bedroom on main level. Spouse works during the day but can stay home as needed. Presented 12/31/2017 with confusion and slurred speech as well as left frontal headache. CT/ MRI demonstrated enhancing lesion with surrounding T2 signal consistent with likely mass. CT the chest with no finding suspicious for malignancy. CT abdomen pelvis unremarkable. Noted seizure 01/02/2018 loaded with Keppra. EEG suggestive for mild generalized cerebral dysfunction no seizure. Underwent left craniotomy as well as biopsy for brain mass 01/04/2018 per Dr. Vertell Limber. Medical oncology consulted preliminary frozen path pathology nondiagnostic await formal pathology report. Decadron protocol as indicated. Tolerating a soft diet. Physical therapy evaluation completed with recommendations of physical medicine rehabilitation consult.   Review of Systems  Constitutional: Negative for chills and fever.  HENT: Negative for hearing loss.   Eyes: Negative for blurred vision and double vision.  Respiratory: Negative for cough and shortness of breath.   Cardiovascular: Negative for chest pain, palpitations and leg swelling.  Gastrointestinal: Positive for constipation. Negative for nausea.  Genitourinary: Negative for dysuria and hematuria.  Musculoskeletal: Positive for myalgias.  Skin: Negative for rash.  Neurological: Positive for dizziness, speech change, focal weakness and headaches.   History reviewed. No pertinent past medical history. Past Surgical History:  Procedure Laterality Date  . APPLICATION OF CRANIAL NAVIGATION N/A 01/04/2018   Procedure: APPLICATION OF  CRANIAL NAVIGATION;  Surgeon: Erline Levine, MD;  Location: [place];  Service: Neurosurgery;  Laterality: N/A;  . PR DURAL GRAFT REPAIR,SPINE DEFECT Left 01/04/2018   Procedure: Left Pterional craniotomy for biopsy with Brainlab;  Surgeon: Erline Levine, MD;  Location: [place];  Service: Neurosurgery;  Laterality: Left;  Left Pterional craniotomy for biopsy with Brainlab   Family History  Problem Relation Age of Onset  . Colon cancer Mother   . CAD Father    Social History:  reports that she has been smoking cigarettes.  She has been smoking about 1.00 pack per day. she has never used smokeless tobacco. She reports that she does not drink alcohol or use drugs. Allergies: No Known Allergies Medications Prior to Admission  Medication Sig Dispense Refill  . aspirin-acetaminophen-caffeine (EXCEDRIN MIGRAINE) 250-250-65 MG tablet Take 1 tablet by mouth every 6 (six) hours as needed for headache or migraine.    . Multiple Vitamin (MULTIVITAMIN WITH MINERALS) TABS tablet Take 1 tablet by mouth daily.    Marland Kitchen OVER THE COUNTER MEDICATION Take by mouth See admin instructions. Over the counter diet pills (Keto etc)      Home: Home Living Family/patient expects to be discharged to:: Private residence Living Arrangements: Spouse/significant other Available Help at Discharge: Family, Available PRN/intermittently Type of Home: House Home Access: Stairs to enter Technical brewer of Steps: 1 Entrance Stairs-Rails: None Home Layout: Two level, Able to live on main level with bedroom/bathroom Bathroom Shower/Tub: Chiropodist: Standard Home Equipment: None  Functional History: Prior Function Level of Independence: Independent Comments: Drives. Not working. Spouse works during the day but can stay home as needed.  Functional Status:  Mobility: Bed Mobility Overal bed mobility: Needs Assistance Bed Mobility: Supine to Sit Supine to sit: Supervision, HOB elevated General bed  mobility comments: OOB with  PT  Transfers Overall transfer level: Needs assistance Equipment used: None Transfers: Sit to/from Stand Sit to Stand: Min guard General transfer comment: MinGuard from EOB for safety Ambulation/Gait Ambulation/Gait assistance: Min assist Ambulation Distance (Feet): 120 Feet Assistive device: None Gait Pattern/deviations: Step-through pattern, Staggering right, Narrow base of support, Decreased stride length, Drifts right/left General Gait Details: Slow, guarded and unsteady gait with decreased arm swing and BUEs reaching for support; running into environment on right side- no awareness. LOB with dual tasking and cognitive tasks.  Gait velocity: decreased Gait velocity interpretation: Below normal speed for age/gender Stairs: Yes Stairs assistance: Min guard Stair Management: Step to pattern, Alternating pattern, Two rails Number of Stairs: 3(+ 2 steps x2 bouts) General stair comments: Cues for safety/technique.    ADL: ADL Overall ADL's : Needs assistance/impaired Eating/Feeding: Supervision/ safety, Set up, Sitting Grooming: Minimal assistance, Standing, Wash/dry face, Oral care Grooming Details (indicate cue type and reason): min steadying assist while standing at sink; verbal cues to initiate, follow through and to terminate tasks  Lower Body Bathing: Sit to/from stand, Min guard Upper Body Dressing : Min guard, Sitting Lower Body Dressing: Minimal assistance, Sit to/from stand Lower Body Dressing Details (indicate cue type and reason): pt able to reach and adjust socks seated EOB  Toilet Transfer: Minimal assistance, Ambulation, Regular Toilet, Grab bars Toileting- Clothing Manipulation and Hygiene: Minimal assistance, Sit to/from stand Functional mobility during ADLs: Minimal assistance General ADL Comments: handoff from PT at start of session with Pt standing at sink to complete grooming ADLs; pt completing room level functional mobility with MinA    Cognition: Cognition Overall Cognitive Status: Impaired/Different from baseline Orientation Level: Oriented to person, Disoriented to place, Disoriented to situation Cognition Arousal/Alertness: Awake/alert Behavior During Therapy: WFL for tasks assessed/performed Overall Cognitive Status: Impaired/Different from baseline Area of Impairment: Orientation, Memory, Safety/judgement, Awareness, Problem solving, Attention, Following commands Orientation Level: Disoriented to, Time, Situation Current Attention Level: Selective Memory: Decreased short-term memory Following Commands: Follows multi-step commands inconsistently, Follows multi-step commands with increased time Safety/Judgement: Decreased awareness of deficits, Decreased awareness of safety Awareness: Intellectual Problem Solving: Slow processing, Requires verbal cues General Comments: pt initially able to state correct day, though when having pt read aloud food items from menu for today (Thursday) pt reading items for Friday and requires cues to recall that it is Tuesday. Pt able to read clock however does not correctly state time, instead reading minute and second numbers located closest to clock hands  Blood pressure (!) 150/94, pulse 63, temperature 97.7 F (36.5 C), temperature source Oral, resp. rate 16, height 5\' 6"  (1.676 m), weight 99.5 kg (219 lb 5.7 oz), SpO2 97 %. Physical Exam  Vitals reviewed. HENT:  Cranio site is dressed with dried blood  Eyes: EOM are normal.  Neck: Normal range of motion. Neck supple. No thyromegaly present.  Cardiovascular: Normal rate, regular rhythm and normal heart sounds.  Respiratory: Effort normal and breath sounds normal. No respiratory distress.  GI: Soft. Bowel sounds are normal. She exhibits no distension.  Neurological: She is alert.  Patient is expressively aphasic. Occasional processing delays/apraxia. Able to convey basic thoughts with extra time. RUE 4/5 prox to distal. LUE  5/5. RLE: 4+/5. LLE 5/5. No sensory deficits.     Results for orders placed or performed during the hospital encounter of 12/31/17 (from the past 24 hour(s))  Glucose, capillary     Status: Abnormal   Collection Time: 01/05/18 12:07 PM  Result Value Ref Range   Glucose-Capillary  141 (H) 65 - 99 mg/dL   Comment 1 Notify RN    Comment 2 Document in Chart   Glucose, capillary     Status: Abnormal   Collection Time: 01/05/18  3:34 PM  Result Value Ref Range   Glucose-Capillary 206 (H) 65 - 99 mg/dL   Comment 1 Notify RN    Comment 2 Document in Chart   Glucose, capillary     Status: Abnormal   Collection Time: 01/05/18  9:19 PM  Result Value Ref Range   Glucose-Capillary 120 (H) 65 - 99 mg/dL   Comment 1 Notify RN    Comment 2 Document in Chart   Basic metabolic panel     Status: Abnormal   Collection Time: 01/06/18  2:54 AM  Result Value Ref Range   Sodium 138 135 - 145 mmol/L   Potassium 3.9 3.5 - 5.1 mmol/L   Chloride 104 101 - 111 mmol/L   CO2 26 22 - 32 mmol/L   Glucose, Bld 152 (H) 65 - 99 mg/dL   BUN 18 6 - 20 mg/dL   Creatinine, Ser 0.53 0.44 - 1.00 mg/dL   Calcium 8.4 (L) 8.9 - 10.3 mg/dL   GFR calc non Af Amer >60 >60 mL/min   GFR calc Af Amer >60 >60 mL/min   Anion gap 8 5 - 15  Glucose, capillary     Status: Abnormal   Collection Time: 01/06/18  6:03 AM  Result Value Ref Range   Glucose-Capillary 139 (H) 65 - 99 mg/dL   Comment 1 Notify RN    Comment 2 Document in Chart    No results found.  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 Does the need for close, 24 hr/day medical supervision in concert with the patient's rehab needs make it unreasonable for this patient to be served in a less intensive setting? Yes 2. Co-Morbidities requiring supervision/potential complications: NS/onc considerations, seizure risk, CAD, pain control 3. Due to bladder management, bowel management, safety,  skin/wound care, disease management, medication administration, pain management and patient education, does the patient require 24 hr/day rehab nursing? Yes 4. Does the patient require coordinated care of a physician, rehab nurse, PT (1-2 hrs/day, 5 days/week), OT (1-2 hrs/day, 5 days/week) and SLP (1-2 hrs/day, 5 days/week) to address physical and functional deficits in the context of the above medical diagnosis(es)? Yes Addressing deficits in the following areas: balance, endurance, locomotion, strength, transferring, bowel/bladder control, bathing, dressing, feeding, grooming, toileting, cognition, language, swallowing and psychosocial support 5. Can the patient actively participate in an intensive therapy program of at least 3 hrs of therapy per day at least 5 days per week? Yes 6. The potential for patient to make measurable gains while on inpatient rehab is excellent 7. Anticipated functional outcomes upon discharge from inpatient rehab are modified independent  with PT, modified independent and supervision with OT, supervision, min assist and mod assist with SLP. 8. Estimated rehab length of stay to reach the above functional goals is: potentially 7-10 days 9. Anticipated D/C setting: Home 10. Anticipated post D/C treatments: HH therapy and Outpatient therapy 11. Overall Rehab/Functional Prognosis: good  RECOMMENDATIONS: This patient's condition is appropriate for continued rehabilitative care in the following setting: CIR Patient has agreed to participate in recommended program. Potentially   Note that insurance prior authorization may be required for reimbursement for recommended care.  Comment: Rehab Admissions Coordinator to follow up.  Thanks,  Meredith Staggers, MD,  FAAPMR    Lavon Paganini Angiulli, PA-C 01/06/2018

## 2018-01-06 NOTE — Progress Notes (Signed)
I met with pt, a daughter, son, and daughter in law at bedside to discuss an inpt rehab goals and expectations. They are all in agreement and prefer inpt rehab rather than SNF. I contacted Dr. Manuella Ghazi and we will make the arrangements to admit today. RN CM and SW made aware. [phone]

## 2018-01-06 NOTE — Progress Notes (Signed)
Cristina Gong, RN  Rehab Admission Coordinator  Physical Medicine and Rehabilitation  PMR Pre-admission  Signed  Date of Service:  01/06/2018 1:11 PM       Related encounter: ED to Hosp-Admission (Current) from 12/31/2017 in [place] [facility]      Signed           [] Hide copied text  [] Hover for details   PMR Admission Coordinator Pre-Admission Assessment  Patient: Tara [name] is an 67 y.o., female MRN: [id number] DOB: 03/04/1951 Height: 5\' 6"  (167.6 cm) Weight: 99.5 kg (219 lb 5.7 oz)                                                                                                                                                  Insurance Information HMO:     PPO:      PCP:      IPA:      80/20: yes     OTHER: no HMO PRIMARY: Medicare a and b      Policy#: [id number]      Subscriber: pt Benefits:  Phone #: passport one online     Name: 01/06/18 Eff. Date: 04/02/2016     Deduct: $1364      Out of Pocket Max: none      Life Max: none CIR: 100%      SNF: 20 full days Outpatient: 80%     Co-Pay: 20% Home Health: 100%      Co-Pay: none DME: 80%     Co-Pay: 20% Providers: pt choice  SECONDARY: none      Medicaid Application Date:       Case Manager:  Disability Application Date:       Case Worker:   Emergency Contact Information        Contact Information    Name Relation Home Work Mobile   Dimaggio,CLIFFORD  [phone]  [phone]   Valentino Saxon   [phone]     Current Medical History  Patient Admitting Diagnosis: left frontal mass, Left CVA  History of Present Illness:  Tara J Matthewsis a 67 y.o.right handed femalewith history of tobacco abuse on no prescription medications.  Presented 12/31/2017 with confusion and slurred speech as well as left frontal headacheand right side weakness. CT/MRI demonstrated enhancing lesion with surrounding T2 signal consistent with likely mass. CT the chest with no finding  suspicious for malignancy. CT abdomen pelvis unremarkable. Noted seizure 01/02/2018 loaded with Keppra. 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 Large area of edema in the left cerebral white matter similar to the recent MRI. 4 mm shift to the right unchanged.Underwent left craniotomy as well as biopsy for brain mass 01/04/2018 per Dr. Vertell Limber. Medical oncology  consulted preliminary frozen path pathology non diagnostic await formal pathology report.Plan suggesting aggressive chemotherapy.Decadron protocol as indicated. Tolerating a soft diet.   Total: 2 NIHSS  Past Medical History  History reviewed. No pertinent past medical history.  Family History  family history includes CAD in her father; Colon cancer in her mother.  Prior Rehab/Hospitalizations:  Has the patient had major surgery during 100 days prior to admission? No  Current Medications   Current Facility-Administered Medications:  .  0.9 % NaCl with KCl 20 mEq/ L  infusion, , Intravenous, Continuous, Erline Levine, MD, Stopped at 01/05/18 [phone] .  acetaminophen (TYLENOL) tablet 650 mg, 650 mg, Oral, Q4H PRN **OR** acetaminophen (TYLENOL) suppository 650 mg, 650 mg, Rectal, Q4H PRN, Erline Levine, MD .  bisacodyl (DULCOLAX) suppository 10 mg, 10 mg, Rectal, Daily PRN, Erline Levine, MD .  Margrett Rud dexamethasone (DECADRON) injection 6 mg, 6 mg, Intravenous, Q6H, 6 mg at 01/05/18 1713 **FOLLOWED BY** dexamethasone (DECADRON) injection 4 mg, 4 mg, Intravenous, Q6H, 4 mg at 01/06/18 1140 **FOLLOWED BY** [START ON 01/07/2018] dexamethasone (DECADRON) injection 4 mg, 4 mg, Intravenous, Q8H, Erline Levine, MD .  docusate sodium (COLACE) capsule 100 mg, 100 mg, Oral, BID, Erline Levine, MD, 100 mg at 01/06/18 1139 .  HYDROcodone-acetaminophen (NORCO/VICODIN) 5-325 MG per tablet 1 tablet, 1  tablet, Oral, Q4H PRN, Erline Levine, MD .  labetalol (NORMODYNE,TRANDATE) injection 10-40 mg, 10-40 mg, Intravenous, Q10 min PRN, Erline Levine, MD, 10 mg at 01/06/18 0031 .  levETIRAcetam (KEPPRA) tablet 500 mg, 500 mg, Oral, BID, Rai, Ripudeep K, MD, 500 mg at 01/06/18 1139 .  morphine 4 MG/ML injection 2 mg, 2 mg, Intravenous, Q2H PRN, Erline Levine, MD .  ondansetron [facility]) tablet 4 mg, 4 mg, Oral, Q4H PRN **OR** ondansetron (ZOFRAN) injection 4 mg, 4 mg, Intravenous, Q4H PRN, Erline Levine, MD .  pantoprazole (PROTONIX) injection 40 mg, 40 mg, Intravenous, Graciela Husbands, MD, 40 mg at 01/05/18 2234 .  polyethylene glycol (MIRALAX / GLYCOLAX) packet 17 g, 17 g, Oral, Daily PRN, Erline Levine, MD .  promethazine (PHENERGAN) tablet 12.5-25 mg, 12.5-25 mg, Oral, Q4H PRN, Erline Levine, MD .  sodium phosphate (FLEET) 7-19 GM/118ML enema 1 enema, 1 enema, Rectal, Once PRN, Erline Levine, MD  Patients Current Diet: DIET SOFT Room service appropriate? Yes; Fluid consistency: Thin Diet - low sodium heart healthy  Precautions / Restrictions Precautions Precautions: Fall Precaution Comments: right inattention Restrictions Weight Bearing Restrictions: No   Has the patient had 2 or more falls or a fall with injury in the past year?No  Prior Activity Level Community (5-7x/wk): INdependent and driving pta  Development worker, international aid / Equipment Home Assistive Devices/Equipment: None Home Equipment: None  Prior Device Use: Indicate devices/aids used by the patient prior to current illness, exacerbation or injury? None of the above  Prior Functional Level Prior Function Level of Independence: Independent Comments: Drives. Not working. Spouse works during the day but can stay home as needed.   Self Care: Did the patient need help bathing, dressing, using the toilet or eating?  Independent  Indoor Mobility: Did the patient need assistance with walking from room to room (with or  without device)? Independent  Stairs: Did the patient need assistance with internal or external stairs (with or without device)? Independent  Functional Cognition: Did the patient need help planning regular tasks such as shopping or remembering to take medications? Independent  Current Functional Level Cognition  Overall Cognitive Status: Impaired/Different from baseline Current Attention Level: Selective Orientation  Level: Oriented to place, Oriented to person Following Commands: Follows multi-step commands inconsistently, Follows multi-step commands with increased time Safety/Judgement: Decreased awareness of deficits, Decreased awareness of safety General Comments: pt initially able to state correct day, though when having pt read aloud food items from menu for today (Thursday) pt reading items for Friday and requires cues to recall that it is Tuesday. Pt able to read clock however does not correctly state time, instead reading minute and second numbers located closest to clock hands    Extremity Assessment (includes Sensation/Coordination)  Upper Extremity Assessment: RUE deficits/detail RUE Deficits / Details: Grossly ~4/5 throughout; decreased smoothness during finger to nose requiring increased time/effort   RUE Sensation: decreased light touch RUE Coordination: decreased fine motor  Lower Extremity Assessment: Defer to PT evaluation RLE Deficits / Details: Grossly ~4/5 throughout.  RLE Sensation: decreased light touch    ADLs  Overall ADL's : Needs assistance/impaired Eating/Feeding: Supervision/ safety, Set up, Sitting Grooming: Minimal assistance, Standing, Wash/dry face, Oral care Grooming Details (indicate cue type and reason): min steadying assist while standing at sink; verbal cues to initiate, follow through and to terminate tasks  Lower Body Bathing: Sit to/from stand, Min guard Upper Body Dressing : Min guard, Sitting Lower Body Dressing: Minimal assistance,  Sit to/from stand Lower Body Dressing Details (indicate cue type and reason): pt able to reach and adjust socks seated EOB  Toilet Transfer: Minimal assistance, Ambulation, Regular Toilet, Grab bars Toileting- Clothing Manipulation and Hygiene: Minimal assistance, Sit to/from stand Functional mobility during ADLs: Minimal assistance General ADL Comments: handoff from PT at start of session with Pt standing at sink to complete grooming ADLs; pt completing room level functional mobility with MinA     Mobility  Overal bed mobility: Needs Assistance Bed Mobility: Supine to Sit Supine to sit: Supervision, HOB elevated General bed mobility comments: OOB with PT     Transfers  Overall transfer level: Needs assistance Equipment used: None Transfers: Sit to/from Stand Sit to Stand: Min guard General transfer comment: MinGuard from EOB for safety    Ambulation / Gait / Stairs / Wheelchair Mobility  Ambulation/Gait Ambulation/Gait assistance: Museum/gallery curator (Feet): 120 Feet Assistive device: None Gait Pattern/deviations: Step-through pattern, Staggering right, Narrow base of support, Decreased stride length, Drifts right/left General Gait Details: Slow, guarded and unsteady gait with decreased arm swing and BUEs reaching for support; running into environment on right side- no awareness. LOB with dual tasking and cognitive tasks.  Gait velocity: decreased Gait velocity interpretation: Below normal speed for age/gender Stairs: Yes Stairs assistance: Min guard Stair Management: Step to pattern, Alternating pattern, Two rails Number of Stairs: 3(+ 2 steps x2 bouts) General stair comments: Cues for safety/technique.    Posture / Balance Dynamic Sitting Balance Sitting balance - Comments: Able to reach outside BoS and adjust socks without LOB. Balance Overall balance assessment: Needs assistance Sitting-balance support: Feet supported, No upper extremity  supported Sitting balance-Leahy Scale: Good Sitting balance - Comments: Able to reach outside BoS and adjust socks without LOB. Standing balance support: During functional activity Standing balance-Leahy Scale: Fair Standing balance comment: Able to perform tasks at sink with Min guard-Min A. LOB with PT when turning in bathroom.    Special needs/care consideration BiPAP/CPAP  N/a CPM  N/a Continuous Drip IV  N/a Dialysis  N/a Life Vest  N/a Oxygen  N/a Special Bed  N/a Trach Size  N/a Wound Vac n/a Skin surgical incision with skin glu; ecchymosis and swelling to surgical  area and left eye Bowel mgmt: continent LBM 3/5 Bladder mgmt:continent Diabetic mgmt  N/a   Previous Home Environment Living Arrangements: Spouse/significant other  Lives With: Spouse Available Help at Discharge: Family, Available 24 hours/day(spouse owns his own buisiness and can stay at home) Type of Home: House Home Layout: Two level, Able to live on main level with bedroom/bathroom Home Access: Stairs to enter Entrance Stairs-Rails: None Entrance Stairs-Number of Steps: 1 Bathroom Shower/Tub: Optometrist: Yes How Accessible: Accessible via walker [place]: No  Discharge Living Setting Plans for Discharge Living Setting: Patient's home, Lives with (comment)(spouse) Type of Home at Discharge: House Discharge Home Layout: Two level, Able to live on main level with bedroom/bathroom Discharge Home Access: Stairs to enter Entrance Stairs-Rails: None Entrance Stairs-Number of Steps: 1 Discharge Bathroom Shower/Tub: Tub/shower unit Discharge Bathroom Toilet: Standard Discharge Bathroom Accessibility: Yes How Accessible: Accessible via walker Does the patient have any problems obtaining your medications?: No  Social/Family/Support Systems Patient Roles: Spouse, Parent Contact Information: son, [name], easier than spouse "Bud" to get up  with as needed Anticipated Caregiver: family Anticipated Caregiver's Contact Information: see above, son, [name] Ability/Limitations of Caregiver: no limitations Caregiver Availability: 24/7 Discharge Plan Discussed with Primary Caregiver: Yes Is Caregiver In Agreement with Plan?: Yes Does Caregiver/Family have Issues with Lodging/Transportation while Pt is in Rehab?: No  Goals/Additional Needs Patient/Family Goal for Rehab: MOd I to supervision to min assist with PT, OT, and SLP Expected length of stay: ELOS 7 to 10 days Pt/Family Agrees to Admission and willing to participate: Yes Program Orientation Provided & Reviewed with Pt/Caregiver Including Roles  & Responsibilities: Yes  Decrease burden of Care through IP rehab admission: n/a  Possible need for SNF placement upon discharge:not anticipated  Patient Condition: This patient's condition remains as documented in the consult dated 01/06/2017, in which the Rehabilitation Physician determined and documented that the patient's condition is appropriate for intensive rehabilitative care in an inpatient rehabilitation facility. Will admit to inpatient rehab today.  Preadmission Screen Completed By:  Cleatrice Burke, 01/06/2018 1:11 PM ______________________________________________________________________   Discussed status with Dr. Naaman Plummer on 01/06/2018 at  1317 and received telephone approval for admission today.  Admission Coordinator:  Cleatrice Burke, time 7106 Date 01/06/2018             Cosigned by: Meredith Staggers, MD at 01/06/2018 1:22 PM  Revision History

## 2018-01-06 NOTE — Evaluation (Signed)
Occupational Therapy Evaluation Patient Details Name: Tara [name] MRN: [id number] DOB: [birth year] Today's Date: 01/06/2018    History of Present Illness Patient is a 67 y/o female who presents with speech difficulties and right sided weakness. 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 s/p crani and biopsy. 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 No PMH.   Clinical Impression   This 67 y/o F presents with the above. At baseline Pt is independent with ADLs and functional mobility. Pt completing room level functional mobility and standing grooming ADLs without AD and with MinA this session; currently requires MinA for LB ADLs, demonstrating deficits including cognitive impairments, decreased attention to R visual field, RUE fine motor/coordination deficits. Pt very pleasant throughout session and motivated to work with therapy. Feel Pt will benefit from continued acute OT services and feel Pt is an excellent candidate for CIR level services to maximize her overall safety and independence with ADLS and mobility prior to return home.      Follow Up Recommendations  CIR;Supervision/Assistance - 24 hour    Equipment Recommendations  Other (comment)(TBD in next venue )    Recommendations for Other Services Rehab consult     Precautions / Restrictions Precautions Precautions: Fall Precaution Comments: right inattention Restrictions Weight Bearing Restrictions: No      Mobility Bed Mobility Overal bed mobility: Needs Assistance Bed Mobility: Supine to Sit     Supine to sit: Supervision;HOB elevated     General bed mobility comments: OOB with PT   Transfers Overall transfer level: Needs assistance Equipment used: None Transfers: Sit to/from Stand Sit to Stand: Min guard         General transfer comment: MinGuard from EOB for safety    Balance Overall balance assessment: Needs assistance Sitting-balance support: Feet  supported;No upper extremity supported Sitting balance-Leahy Scale: Good Sitting balance - Comments: Able to reach outside BoS and adjust socks without LOB.   Standing balance support: During functional activity Standing balance-Leahy Scale: Fair Standing balance comment: Able to perform tasks at sink with Min guard-Min A. LOB with PT when turning in bathroom.                           ADL either performed or assessed with clinical judgement   ADL Overall ADL's : Needs assistance/impaired Eating/Feeding: Supervision/ safety;Set up;Sitting   Grooming: Minimal assistance;Standing;Wash/dry face;Oral care Grooming Details (indicate cue type and reason): min steadying assist while standing at sink; verbal cues to initiate, follow through and to terminate tasks      Lower Body Bathing: Sit to/from stand;Min guard   Upper Body Dressing : Min guard;Sitting   Lower Body Dressing: Minimal assistance;Sit to/from stand Lower Body Dressing Details (indicate cue type and reason): pt able to reach and adjust socks seated EOB  Toilet Transfer: Minimal assistance;Ambulation;Regular Toilet;Grab bars   Toileting- Clothing Manipulation and Hygiene: Minimal assistance;Sit to/from stand       Functional mobility during ADLs: Minimal assistance General ADL Comments: handoff from PT at start of session with Pt standing at sink to complete grooming ADLs; pt completing room level functional mobility with MinA      Vision Baseline Vision/History: Wears glasses(wears one contact at baseline ) Patient Visual Report: Other (comment)(pt reports improvements in vision ) Vision Assessment?: Yes Eye Alignment: Within Functional Limits Ocular Range of Motion: Within Functional Limits Tracking/Visual Pursuits: Able to track stimulus in all quads without difficulty  Visual Fields: No apparent deficits                Pertinent Vitals/Pain Pain Assessment: No/denies pain     Hand Dominance  Right   Extremity/Trunk Assessment Upper Extremity Assessment Upper Extremity Assessment: RUE deficits/detail RUE Deficits / Details: Grossly ~4/5 throughout; decreased smoothness during finger to nose requiring increased time/effort   RUE Sensation: decreased light touch RUE Coordination: decreased fine motor   Lower Extremity Assessment Lower Extremity Assessment: Defer to PT evaluation RLE Deficits / Details: Grossly ~4/5 throughout.  RLE Sensation: decreased light touch   Cervical / Trunk Assessment Cervical / Trunk Assessment: Normal   Communication Communication Communication: Expressive difficulties   Cognition Arousal/Alertness: Awake/alert Behavior During Therapy: WFL for tasks assessed/performed Overall Cognitive Status: Impaired/Different from baseline Area of Impairment: Orientation;Memory;Safety/judgement;Awareness;Problem solving;Attention;Following commands                 Orientation Level: Disoriented to;Time;Situation Current Attention Level: Selective Memory: Decreased short-term memory Following Commands: Follows multi-step commands inconsistently;Follows multi-step commands with increased time Safety/Judgement: Decreased awareness of deficits;Decreased awareness of safety Awareness: Intellectual Problem Solving: Slow processing;Requires verbal cues General Comments: pt initially able to state correct day, though when having pt read aloud food items from menu for today (Thursday) pt reading items for Friday and requires cues to recall that it is Tuesday. Pt able to read clock however does not correctly state time, instead reading minute and second numbers located closest to clock hands   General Comments  Children present during session.                Home Living Family/patient expects to be discharged to:: Private residence Living Arrangements: Spouse/significant other Available Help at Discharge: Family;Available PRN/intermittently Type of  Home: House Home Access: Stairs to enter [employer] of Steps: 1 Entrance Stairs-Rails: None Home Layout: Two level;Able to live on main level with bedroom/bathroom     Bathroom Shower/Tub: Teacher, early years/pre: Standard     Home Equipment: None          Prior Functioning/Environment Level of Independence: Independent        Comments: Drives. Not working. Spouse works during the day but can stay home as needed.         OT Problem List: Decreased strength;Decreased activity tolerance;Decreased range of motion;Impaired balance (sitting and/or standing);Decreased cognition;Impaired sensation;Impaired UE functional use;Decreased safety awareness      OT Treatment/Interventions: Self-care/ADL training;Neuromuscular education;DME and/or AE instruction;Therapeutic activities;Balance training;Therapeutic exercise;Cognitive remediation/compensation;Patient/family education    OT Goals(Current goals can be found in the care plan section) Acute Rehab OT Goals Patient Stated Goal: to get home and be able to care for grandbaby coming in April OT Goal Formulation: With patient Time For Goal Achievement: 01/20/18 Potential to Achieve Goals: Good  OT Frequency: Min 2X/week     AM-PAC PT "6 Clicks" Daily Activity     Outcome Measure Help from another person eating meals?: A Little Help from another person taking care of personal grooming?: A Little Help from another person toileting, which includes using toliet, bedpan, or urinal?: A Little Help from another person bathing (including washing, rinsing, drying)?: A Lot Help from another person to put on and taking off regular upper body clothing?: A Little Help from another person to put on and taking off regular lower body clothing?: A Lot 6 Click Score: 16   End of Session Equipment Utilized During Treatment: Gait belt Nurse Communication: Mobility status  Activity Tolerance: Patient  tolerated treatment  well Patient left: in chair;with call bell/phone within reach;with chair alarm set;with family/visitor present  OT Visit Diagnosis: Unsteadiness on feet (R26.81);Other symptoms and signs involving cognitive function                Time: 5300-5110 OT Time Calculation (min): 20 min Charges:  OT General Charges $OT Visit: 1 Visit OT Evaluation $OT Eval Moderate Complexity: 1 Mod G-Codes:     Lou Cal, OT Pager [phone] 01/06/2018   Raymondo Band 01/06/2018, 10:53 AM

## 2018-01-06 NOTE — Care Management Note (Signed)
Case Management Note  Patient Details  Name: Tara [name] MRN: [id number] Date of Birth: [birth year]  Subjective/Objective:                    Action/Plan: Pt discharging to CIR. Pt will need PCP prior to d/c from CIR. CM unable to make arrangements d/t not knowing d/c from CIR. CM signing off.   Expected Discharge Date:  01/06/18               Expected Discharge Plan:  [place]  In-House Referral:     Discharge planning Services  CM Consult  Post Acute Care Choice:    Choice offered to:     DME Arranged:    DME Agency:     HH Arranged:    HH Agency:     Status of Service:  Completed, signed off  If discussed at [employer], dates discussed:    Additional Comments:  Pollie Friar, RN 01/06/2018, 1:37 PM

## 2018-01-06 NOTE — Progress Notes (Signed)
Patient ID: Tara [name], female   DOB: 1951-08-26, 67 y.o.   MRN: [id number] Patient admitted to [phone] via bed, escorted by nursing staff and family.  Patient and family verbalized understanding of rehab process, specifically our fall prevention policy.  Dressing to surgical incision on left head stained with old blood, no new drainage noted.  Patient appears to be in no immediate distress at this time.  Patient left in bed with call bell within reach and family at bedside.  Patient denies pain.  Will continue to monitor. Brita Romp, RN

## 2018-01-06 NOTE — H&P (Signed)
Physical Medicine and Rehabilitation Admission H&P       Chief Complaint  Patient presents with  . Altered Mental Status  : HPI: Tara J Matthewsis a 67 y.o.right handed femalewith history of tobacco abuse on no prescription medications. Per chart review patient lives with spouse and was independent prior to admission. Two-level home with bedroom on main level. Spouse works during the day but can stay home as needed. Presented 12/31/2017 with confusion and slurred speech as well as left frontal headache and right side weakness. CT/MRI demonstrated enhancing lesion with surrounding T2 signal consistent with likely mass. CT the chest with no finding suspicious for malignancy. CT abdomen pelvis unremarkable. Noted seizure 01/02/2018 loaded with Keppra. EEG suggestive for mild generalized cerebral dysfunction no seizure. 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 Large area of edema in the left cerebral white matter similar to the recent MRI. 4 mm shift to the right unchanged. Underwent left craniotomy as well as biopsy for brain mass 01/04/2018 per Dr. Vertell Limber. Medical oncology consulted preliminary frozen path pathology nondiagnostic await formal pathology report. Plan suggesting aggressive chemotherapy. Decadron protocol as indicated. Tolerating a soft diet. Physical and occupational therapy evaluations completed with recommendations of physical medicine rehabilitation consult. Patient was admitted for a comprehensive rehabilitation program    Review of Systems  Constitutional: Negative for chills and fever.  HENT: Negative for hearing loss.   Eyes: Negative for blurred vision and double vision.  Respiratory: Negative for cough and shortness of breath.   Cardiovascular: Negative for chest pain and palpitations.  Gastrointestinal: Positive for constipation. Negative for nausea and vomiting.  Genitourinary:  Negative for dysuria and hematuria.  Musculoskeletal: Positive for myalgias.  Skin: Negative for rash.  Neurological: Positive for speech change, focal weakness, seizures and headaches.  All other systems reviewed and are negative.  History reviewed. No pertinent past medical history.      Past Surgical History:  Procedure Laterality Date  . APPLICATION OF CRANIAL NAVIGATION N/A 01/04/2018   Procedure: APPLICATION OF CRANIAL NAVIGATION;  Surgeon: Erline Levine, MD;  Location: [facility];  Service: Neurosurgery;  Laterality: N/A;  . PR DURAL GRAFT REPAIR,SPINE DEFECT Left 01/04/2018   Procedure: Left Pterional craniotomy for biopsy with Brainlab;  Surgeon: Erline Levine, MD;  Location: [facility];  Service: Neurosurgery;  Laterality: Left;  Left Pterional craniotomy for biopsy with Brainlab        Family History  Problem Relation Age of Onset  . Colon cancer Mother   . CAD Father    Social History:  reports that she has been smoking cigarettes.  She has been smoking about 1.00 pack per day. she has never used smokeless tobacco. She reports that she does not drink alcohol or use drugs. Allergies: No Known Allergies       Medications Prior to Admission  Medication Sig Dispense Refill  . aspirin-acetaminophen-caffeine (EXCEDRIN MIGRAINE) 250-250-65 MG tablet Take 1 tablet by mouth every 6 (six) hours as needed for headache or migraine.    . Multiple Vitamin (MULTIVITAMIN WITH MINERALS) TABS tablet Take 1 tablet by mouth daily.    Marland Kitchen OVER THE COUNTER MEDICATION Take by mouth See admin instructions. Over the counter diet pills (Keto etc)      Drug Regimen Review Drug regimen was reviewed and remains appropriate with no significant issues identified  Home: Home Living Family/patient expects to be discharged to:: Private residence Living Arrangements: Spouse/significant other  Available Help at Discharge: Family, Available PRN/intermittently Type of Home: House Home Access: Stairs to  enter [employer] of Steps: 1 Entrance Stairs-Rails: None Home Layout: Two level, Able to live on main level with bedroom/bathroom Bathroom Shower/Tub: Chiropodist: Standard Home Equipment: None   Functional History: Prior Function Level of Independence: Independent Comments: Drives. Not working. Spouse works during the day but can stay home as needed.   Functional Status:  Mobility: Bed Mobility Overal bed mobility: Needs Assistance Bed Mobility: Supine to Sit Supine to sit: Supervision, HOB elevated General bed mobility comments: OOB with PT  Transfers Overall transfer level: Needs assistance Equipment used: None Transfers: Sit to/from Stand Sit to Stand: Min guard General transfer comment: MinGuard from EOB for safety Ambulation/Gait Ambulation/Gait assistance: Min assist Ambulation Distance (Feet): 120 Feet Assistive device: None Gait Pattern/deviations: Step-through pattern, Staggering right, Narrow base of support, Decreased stride length, Drifts right/left General Gait Details: Slow, guarded and unsteady gait with decreased arm swing and BUEs reaching for support; running into environment on right side- no awareness. LOB with dual tasking and cognitive tasks.  Gait velocity: decreased Gait velocity interpretation: Below normal speed for age/gender Stairs: Yes Stairs assistance: Min guard Stair Management: Step to pattern, Alternating pattern, Two rails Number of Stairs: 3(+ 2 steps x2 bouts) General stair comments: Cues for safety/technique.  ADL: ADL Overall ADL's : Needs assistance/impaired Eating/Feeding: Supervision/ safety, Set up, Sitting Grooming: Minimal assistance, Standing, Wash/dry face, Oral care Grooming Details (indicate cue type and reason): min steadying assist while standing at sink; verbal cues to initiate, follow through and to terminate tasks  Lower Body Bathing: Sit to/from stand, Min guard Upper Body Dressing  : Min guard, Sitting Lower Body Dressing: Minimal assistance, Sit to/from stand Lower Body Dressing Details (indicate cue type and reason): pt able to reach and adjust socks seated EOB  Toilet Transfer: Minimal assistance, Ambulation, Regular Toilet, Grab bars Toileting- Clothing Manipulation and Hygiene: Minimal assistance, Sit to/from stand Functional mobility during ADLs: Minimal assistance General ADL Comments: handoff from PT at start of session with Pt standing at sink to complete grooming ADLs; pt completing room level functional mobility with MinA   Cognition: Cognition Overall Cognitive Status: Impaired/Different from baseline Orientation Level: Oriented to place, Oriented to person Cognition Arousal/Alertness: Awake/alert Behavior During Therapy: WFL for tasks assessed/performed Overall Cognitive Status: Impaired/Different from baseline Area of Impairment: Orientation, Memory, Safety/judgement, Awareness, Problem solving, Attention, Following commands Orientation Level: Disoriented to, Time, Situation Current Attention Level: Selective Memory: Decreased short-term memory Following Commands: Follows multi-step commands inconsistently, Follows multi-step commands with increased time Safety/Judgement: Decreased awareness of deficits, Decreased awareness of safety Awareness: Intellectual Problem Solving: Slow processing, Requires verbal cues General Comments: pt initially able to state correct day, though when having pt read aloud food items from menu for today (Thursday) pt reading items for Friday and requires cues to recall that it is Tuesday. Pt able to read clock however does not correctly state time, instead reading minute and second numbers located closest to clock hands  Physical Exam: Blood pressure (!) 152/78, pulse 63, temperature 97.8 F (36.6 C), temperature source Axillary, resp. rate 16, height '5\' 6"'  (1.676 m), weight 99.5 kg (219 lb 5.7 oz), SpO2 96 %. Physical  Exam Vitalsreviewed. HENT: Cranio site is dressed with dried blood, bruising, ecchymoses over left temple and peri-ocular area Eyes:EOMare normal.  Neck:Normal range of motion.Neck supple.No thyromegalypresent.  Cardiovascular:Normal rate,regular rhythmand normal heart sounds.  Respiratory:Effort normaland breath sounds normal. Norespiratory distress.  QM:VHQI.Bowel sounds are normal. She exhibitsno distension/nontender Neurological: She isalert. Patient is expressively aphasic. Occasional processing delays/apraxia. Able to convey basic thoughts using short phrases and words. With extra time, she can convey thoughts.  RUE 4/5 prox to distal. LUE 5/5. RLE: 4+/5. LLE 5/5. No sensory deficits Psych: pleasant and cooperative    LabResultsLast48Hours        Results for orders placed or performed during the hospital encounter of 12/31/17 (from the past 48 hour(s))  Glucose, capillary     Status: Abnormal   Collection Time: 01/05/18  3:17 AM  Result Value Ref Range   Glucose-Capillary 145 (H) 65 - 99 mg/dL  Glucose, capillary     Status: Abnormal   Collection Time: 01/05/18  7:44 AM  Result Value Ref Range   Glucose-Capillary 149 (H) 65 - 99 mg/dL  Glucose, capillary     Status: Abnormal   Collection Time: 01/05/18 12:07 PM  Result Value Ref Range   Glucose-Capillary 141 (H) 65 - 99 mg/dL   Comment 1 Notify RN    Comment 2 Document in Chart   Glucose, capillary     Status: Abnormal   Collection Time: 01/05/18  3:34 PM  Result Value Ref Range   Glucose-Capillary 206 (H) 65 - 99 mg/dL   Comment 1 Notify RN    Comment 2 Document in Chart   Glucose, capillary     Status: Abnormal   Collection Time: 01/05/18  9:19 PM  Result Value Ref Range   Glucose-Capillary 120 (H) 65 - 99 mg/dL   Comment 1 Notify RN    Comment 2 Document in Chart   Basic metabolic panel     Status: Abnormal   Collection Time: 01/06/18  2:54 AM  Result Value Ref  Range   Sodium 138 135 - 145 mmol/L   Potassium 3.9 3.5 - 5.1 mmol/L   Chloride 104 101 - 111 mmol/L   CO2 26 22 - 32 mmol/L   Glucose, Bld 152 (H) 65 - 99 mg/dL   BUN 18 6 - 20 mg/dL   Creatinine, Ser 0.53 0.44 - 1.00 mg/dL   Calcium 8.4 (L) 8.9 - 10.3 mg/dL   GFR calc non Af Amer >60 >60 mL/min   GFR calc Af Amer >60 >60 mL/min    Comment: (NOTE) The eGFR has been calculated using the CKD EPI equation. This calculation has not been validated in all clinical situations. eGFR's persistently <60 mL/min signify possible Chronic Kidney Disease.    Anion gap 8 5 - 15    Comment: Performed at [facility] [address] [place], [place] [address]  Glucose, capillary     Status: Abnormal   Collection Time: 01/06/18  6:03 AM  Result Value Ref Range   Glucose-Capillary 139 (H) 65 - 99 mg/dL   Comment 1 Notify RN    Comment 2 Document in Chart   Glucose, capillary     Status: Abnormal   Collection Time: 01/06/18 11:37 AM  Result Value Ref Range   Glucose-Capillary 128 (H) 65 - 99 mg/dL   Comment 1 Notify RN    Comment 2 Document in Chart      ImagingResults(Last48hours)  No results found.       Medical Problem List and Plan: 1.  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 Formal pathology report pending.             -admit to  inpatient rehab 2.  DVT Prophylaxis/Anticoagulation: SCDs. Monitor for any signs of DVT. Patient is ambulatory. 3. Pain Management: Hydrocodone as needed 4. Mood: Provide emotional support 5. Neuropsych: This patient is capable of making decisions on her own behalf. 6. Skin/Wound Care: local care to crani incision/face 7. Fluids/Electrolytes/Nutrition: Routine I&O's with follow-up chemistries upon admit.  8. Seizure disorder. Keppra 500 mg twice a day. EEG negative 9. Tobacco abuse. Counseling as appropriate 10.  Constipation. Laxative assistance    Post Admission Physician Evaluation: 1. Functional deficits secondary  to left frontal brain mass, left CR infarct. 2. Patient is admitted to receive collaborative, interdisciplinary care between the physiatrist, rehab nursing staff, and therapy team. 3. Patient's level of medical complexity and substantial therapy needs in context of that medical necessity cannot be provided at a lesser intensity of care such as a SNF. 4. Patient has experienced substantial functional loss from his/her baseline which was documented above under the "Functional History" and "Functional Status" headings.  Judging by the patient's diagnosis, physical exam, and functional history, the patient has potential for functional progress which will result in measurable gains while on inpatient rehab.  These gains will be of substantial and practical use upon discharge  in facilitating mobility and self-care at the household level. 5. Physiatrist will provide 24 hour management of medical needs as well as oversight of the therapy plan/treatment and provide guidance as appropriate regarding the interaction of the two. 6. The Preadmission Screening has been reviewed and patient status is unchanged unless otherwise stated above. 7. 24 hour rehab nursing will assist with bladder management, bowel management, safety, skin/wound care, disease management, medication administration, pain management and patient education  and help integrate therapy concepts, techniques,education, etc. 8. PT will assess and treat for/with: Lower extremity strength, range of motion, stamina, balance, functional mobility, safety, adaptive techniques and equipment, NMR, family education.   Goals are: mod I. 9. OT will assess and treat for/with: ADL's, functional mobility, safety, upper extremity strength, adaptive techniques and equipment, NMR, family education.   Goals are: mod I. Therapy may proceed with showering this  patient. 10. SLP will assess and treat for/with: speech, language, cognition, family education.  Goals are: supervision to min assist. 11. Case Management and Social Worker will assess and treat for psychological issues and discharge planning. 12. Team conference will be held weekly to assess progress toward goals and to determine barriers to discharge. 13. Patient will receive at least 3 hours of therapy per day at least 5 days per week. 14. ELOS: 7-10 days       15. Prognosis:  excellent     Meredith Staggers, MD, [facility] 01/06/2018  Lavon Paganini [place], PA-C 01/06/2018

## 2018-01-06 NOTE — Progress Notes (Signed)
Meredith Staggers, MD  Physician  Physical Medicine and Rehabilitation  Consult Note  Signed  Date of Service:  01/06/2018 10:33 AM       Related encounter: ED to Hosp-Admission (Current) from 12/31/2017 in [place] [facility]      Signed      Expand All Collapse All      [] Hide copied text  [] Hover for details        Physical Medicine and Rehabilitation Consult Reason for Consult: Right sided weakness and slurred speech Referring Physician: [facility]   HPI: Tara [name] is a 67 y.o. right handed female with history of tobacco abuse on no prescription medications. Per chart review patient lives with spouse and was independent prior to admission. Two-level home with bedroom on main level. Spouse works during the day but can stay home as needed. Presented 12/31/2017 with confusion and slurred speech as well as left frontal headache. CT/ MRI demonstrated enhancing lesion with surrounding T2 signal consistent with likely mass. CT the chest with no finding suspicious for malignancy. CT abdomen pelvis unremarkable. Noted seizure 01/02/2018 loaded with Keppra. EEG suggestive for mild generalized cerebral dysfunction no seizure. Underwent left craniotomy as well as biopsy for brain mass 01/04/2018 per Dr. Vertell Limber. Medical oncology consulted preliminary frozen path pathology nondiagnostic await formal pathology report. Decadron protocol as indicated. Tolerating a soft diet. Physical therapy evaluation completed with recommendations of physical medicine rehabilitation consult.   Review of Systems  Constitutional: Negative for chills and fever.  HENT: Negative for hearing loss.   Eyes: Negative for blurred vision and double vision.  Respiratory: Negative for cough and shortness of breath.   Cardiovascular: Negative for chest pain, palpitations and leg swelling.  Gastrointestinal: Positive for constipation. Negative for nausea.  Genitourinary: Negative for dysuria and  hematuria.  Musculoskeletal: Positive for myalgias.  Skin: Negative for rash.  Neurological: Positive for dizziness, speech change, focal weakness and headaches.   History reviewed. No pertinent past medical history.      Past Surgical History:  Procedure Laterality Date  . APPLICATION OF CRANIAL NAVIGATION N/A 01/04/2018   Procedure: APPLICATION OF CRANIAL NAVIGATION;  Surgeon: Erline Levine, MD;  Location: [place];  Service: Neurosurgery;  Laterality: N/A;  . PR DURAL GRAFT REPAIR,SPINE DEFECT Left 01/04/2018   Procedure: Left Pterional craniotomy for biopsy with Brainlab;  Surgeon: Erline Levine, MD;  Location: [place];  Service: Neurosurgery;  Laterality: Left;  Left Pterional craniotomy for biopsy with Brainlab        Family History  Problem Relation Age of Onset  . Colon cancer Mother   . CAD Father    Social History:  reports that she has been smoking cigarettes.  She has been smoking about 1.00 pack per day. she has never used smokeless tobacco. She reports that she does not drink alcohol or use drugs. Allergies: No Known Allergies       Medications Prior to Admission  Medication Sig Dispense Refill  . aspirin-acetaminophen-caffeine (EXCEDRIN MIGRAINE) 250-250-65 MG tablet Take 1 tablet by mouth every 6 (six) hours as needed for headache or migraine.    . Multiple Vitamin (MULTIVITAMIN WITH MINERALS) TABS tablet Take 1 tablet by mouth daily.    Marland Kitchen OVER THE COUNTER MEDICATION Take by mouth See admin instructions. Over the counter diet pills (Keto etc)      Home: Home Living Family/patient expects to be discharged to:: Private residence Living Arrangements: Spouse/significant other Available Help at Discharge: Family, Available PRN/intermittently Type of Home:  House Home Access: Stairs to enter [employer] of Steps: 1 Entrance Stairs-Rails: None Home Layout: Two level, Able to live on main level with bedroom/bathroom Bathroom Shower/Tub: Scientist, forensic: Standard Home Equipment: None  Functional History: Prior Function Level of Independence: Independent Comments: Drives. Not working. Spouse works during the day but can stay home as needed.  Functional Status:  Mobility: Bed Mobility Overal bed mobility: Needs Assistance Bed Mobility: Supine to Sit Supine to sit: Supervision, HOB elevated General bed mobility comments: OOB with PT  Transfers Overall transfer level: Needs assistance Equipment used: None Transfers: Sit to/from Stand Sit to Stand: Min guard General transfer comment: MinGuard from EOB for safety Ambulation/Gait Ambulation/Gait assistance: Min assist Ambulation Distance (Feet): 120 Feet Assistive device: None Gait Pattern/deviations: Step-through pattern, Staggering right, Narrow base of support, Decreased stride length, Drifts right/left General Gait Details: Slow, guarded and unsteady gait with decreased arm swing and BUEs reaching for support; running into environment on right side- no awareness. LOB with dual tasking and cognitive tasks.  Gait velocity: decreased Gait velocity interpretation: Below normal speed for age/gender Stairs: Yes Stairs assistance: Min guard Stair Management: Step to pattern, Alternating pattern, Two rails Number of Stairs: 3(+ 2 steps x2 bouts) General stair comments: Cues for safety/technique.  ADL: ADL Overall ADL's : Needs assistance/impaired Eating/Feeding: Supervision/ safety, Set up, Sitting Grooming: Minimal assistance, Standing, Wash/dry face, Oral care Grooming Details (indicate cue type and reason): min steadying assist while standing at sink; verbal cues to initiate, follow through and to terminate tasks  Lower Body Bathing: Sit to/from stand, Min guard Upper Body Dressing : Min guard, Sitting Lower Body Dressing: Minimal assistance, Sit to/from stand Lower Body Dressing Details (indicate cue type and reason): pt able to reach and adjust socks seated  EOB  Toilet Transfer: Minimal assistance, Ambulation, Regular Toilet, Grab bars Toileting- Clothing Manipulation and Hygiene: Minimal assistance, Sit to/from stand Functional mobility during ADLs: Minimal assistance General ADL Comments: handoff from PT at start of session with Pt standing at sink to complete grooming ADLs; pt completing room level functional mobility with MinA   Cognition: Cognition Overall Cognitive Status: Impaired/Different from baseline Orientation Level: Oriented to person, Disoriented to place, Disoriented to situation Cognition Arousal/Alertness: Awake/alert Behavior During Therapy: WFL for tasks assessed/performed Overall Cognitive Status: Impaired/Different from baseline Area of Impairment: Orientation, Memory, Safety/judgement, Awareness, Problem solving, Attention, Following commands Orientation Level: Disoriented to, Time, Situation Current Attention Level: Selective Memory: Decreased short-term memory Following Commands: Follows multi-step commands inconsistently, Follows multi-step commands with increased time Safety/Judgement: Decreased awareness of deficits, Decreased awareness of safety Awareness: Intellectual Problem Solving: Slow processing, Requires verbal cues General Comments: pt initially able to state correct day, though when having pt read aloud food items from menu for today (Thursday) pt reading items for Friday and requires cues to recall that it is Tuesday. Pt able to read clock however does not correctly state time, instead reading minute and second numbers located closest to clock hands  Blood pressure (!) 150/94, pulse 63, temperature 97.7 F (36.5 C), temperature source Oral, resp. rate 16, height 5\' 6"  (1.676 m), weight 99.5 kg (219 lb 5.7 oz), SpO2 97 %. Physical Exam  Vitals reviewed. HENT:  Cranio site is dressed with dried blood  Eyes: EOM are normal.  Neck: Normal range of motion. Neck supple. No thyromegaly present.   Cardiovascular: Normal rate, regular rhythm and normal heart sounds.  Respiratory: Effort normal and breath sounds normal. No respiratory distress.  GI: Soft.  Bowel sounds are normal. She exhibits no distension.  Neurological: She is alert.  Patient is expressively aphasic. Occasional processing delays/apraxia. Able to convey basic thoughts with extra time. RUE 4/5 prox to distal. LUE 5/5. RLE: 4+/5. LLE 5/5. No sensory deficits.     LabResultsLast24Hours       Results for orders placed or performed during the hospital encounter of 12/31/17 (from the past 24 hour(s))  Glucose, capillary     Status: Abnormal   Collection Time: 01/05/18 12:07 PM  Result Value Ref Range   Glucose-Capillary 141 (H) 65 - 99 mg/dL   Comment 1 Notify RN    Comment 2 Document in Chart   Glucose, capillary     Status: Abnormal   Collection Time: 01/05/18  3:34 PM  Result Value Ref Range   Glucose-Capillary 206 (H) 65 - 99 mg/dL   Comment 1 Notify RN    Comment 2 Document in Chart   Glucose, capillary     Status: Abnormal   Collection Time: 01/05/18  9:19 PM  Result Value Ref Range   Glucose-Capillary 120 (H) 65 - 99 mg/dL   Comment 1 Notify RN    Comment 2 Document in Chart   Basic metabolic panel     Status: Abnormal   Collection Time: 01/06/18  2:54 AM  Result Value Ref Range   Sodium 138 135 - 145 mmol/L   Potassium 3.9 3.5 - 5.1 mmol/L   Chloride 104 101 - 111 mmol/L   CO2 26 22 - 32 mmol/L   Glucose, Bld 152 (H) 65 - 99 mg/dL   BUN 18 6 - 20 mg/dL   Creatinine, Ser 0.53 0.44 - 1.00 mg/dL   Calcium 8.4 (L) 8.9 - 10.3 mg/dL   GFR calc non Af Amer >60 >60 mL/min   GFR calc Af Amer >60 >60 mL/min   Anion gap 8 5 - 15  Glucose, capillary     Status: Abnormal   Collection Time: 01/06/18  6:03 AM  Result Value Ref Range   Glucose-Capillary 139 (H) 65 - 99 mg/dL   Comment 1 Notify RN    Comment 2 Document in Chart      ImagingResults(Last48hours)   No results found.    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 Does the need for close, 24 hr/day medical supervision in concert with the patient's rehab needs make it unreasonable for this patient to be served in a less intensive setting? Yes 2. Co-Morbidities requiring supervision/potential complications: NS/onc considerations, seizure risk, CAD, pain control 3. Due to bladder management, bowel management, safety, skin/wound care, disease management, medication administration, pain management and patient education, does the patient require 24 hr/day rehab nursing? Yes 4. Does the patient require coordinated care of a physician, rehab nurse, PT (1-2 hrs/day, 5 days/week), OT (1-2 hrs/day, 5 days/week) and SLP (1-2 hrs/day, 5 days/week) to address physical and functional deficits in the context of the above medical diagnosis(es)? Yes Addressing deficits in the following areas: balance, endurance, locomotion, strength, transferring, bowel/bladder control, bathing, dressing, feeding, grooming, toileting, cognition, language, swallowing and psychosocial support 5. Can the patient actively participate in an intensive therapy program of at least 3 hrs of therapy per day at least 5 days per week? Yes 6. The potential for patient to make measurable gains while on inpatient rehab is excellent 7. Anticipated functional outcomes upon discharge from inpatient rehab are modified independent  with PT, modified independent and supervision  with OT, supervision, min assist and mod assist with SLP. 8. Estimated rehab length of stay to reach the above functional goals is: potentially 7-10 days 9. Anticipated D/C setting: Home 10. Anticipated post D/C treatments: HH therapy and Outpatient therapy 11. Overall Rehab/Functional Prognosis: good  RECOMMENDATIONS: This patient's condition is appropriate for continued rehabilitative care in  the following setting: CIR Patient has agreed to participate in recommended program. Potentially   Note that insurance prior authorization may be required for reimbursement for recommended care.  Comment: Rehab Admissions Coordinator to follow up.  Thanks,  Meredith Staggers, MD, Mellody Drown    Lavon Paganini Angiulli, PA-C 01/06/2018          Revision History                        Routing History

## 2018-01-06 NOTE — Progress Notes (Signed)
Inpatient Rehabilitation  Per PT request, patient was screened by Jalen Oberry for appropriateness for an Inpatient Acute Rehab consult.  At this time we are recommending an Inpatient Rehab consult.  Text paged MD to notify; please order if you are agreeable.    Jolane Bankhead, M.A., CCC/SLP Admission Coordinator  [facility]  Cell [phone]  

## 2018-01-06 NOTE — H&P (Signed)
Physical Medicine and Rehabilitation Admission H&P    Chief Complaint  Patient presents with  . Altered Mental Status  : HPI:  Tara [name] is a 67 y.o. right handed female with history of tobacco abuse on no prescription medications. Per chart review patient lives with spouse and was independent prior to admission. Two-level home with bedroom on main level. Spouse works during the day but can stay home as needed. Presented 12/31/2017 with confusion and slurred speech as well as left frontal headache and right side weakness. CT/ MRI demonstrated enhancing lesion with surrounding T2 signal consistent with likely mass. CT the chest with no finding suspicious for malignancy. CT abdomen pelvis unremarkable. Noted seizure 01/02/2018 loaded with Keppra. EEG suggestive for mild generalized cerebral dysfunction no seizure. 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 Large area of edema in the left cerebral white matter similar to the recent MRI. 4 mm shift to the right unchanged. Underwent left craniotomy as well as biopsy for brain mass 01/04/2018 per Dr. Vertell Limber. Medical oncology consulted preliminary frozen path pathology nondiagnostic await formal pathology report. Plan suggesting aggressive chemotherapy. Decadron protocol as indicated. Tolerating a soft diet. Physical and occupational therapy evaluations completed with recommendations of physical medicine rehabilitation consult. Patient was admitted for a comprehensive rehabilitation program    Review of Systems  Constitutional: Negative for chills and fever.  HENT: Negative for hearing loss.   Eyes: Negative for blurred vision and double vision.  Respiratory: Negative for cough and shortness of breath.   Cardiovascular: Negative for chest pain and palpitations.  Gastrointestinal: Positive for constipation. Negative for nausea and vomiting.  Genitourinary: Negative  for dysuria and hematuria.  Musculoskeletal: Positive for myalgias.  Skin: Negative for rash.  Neurological: Positive for speech change, focal weakness, seizures and headaches.  All other systems reviewed and are negative.  History reviewed. No pertinent past medical history. Past Surgical History:  Procedure Laterality Date  . APPLICATION OF CRANIAL NAVIGATION N/A 01/04/2018   Procedure: APPLICATION OF CRANIAL NAVIGATION;  Surgeon: Erline Levine, MD;  Location: [facility];  Service: Neurosurgery;  Laterality: N/A;  . PR DURAL GRAFT REPAIR,SPINE DEFECT Left 01/04/2018   Procedure: Left Pterional craniotomy for biopsy with Brainlab;  Surgeon: Erline Levine, MD;  Location: [facility];  Service: Neurosurgery;  Laterality: Left;  Left Pterional craniotomy for biopsy with Brainlab   Family History  Problem Relation Age of Onset  . Colon cancer Mother   . CAD Father    Social History:  reports that she has been smoking cigarettes.  She has been smoking about 1.00 pack per day. she has never used smokeless tobacco. She reports that she does not drink alcohol or use drugs. Allergies: No Known Allergies Medications Prior to Admission  Medication Sig Dispense Refill  . aspirin-acetaminophen-caffeine (EXCEDRIN MIGRAINE) 250-250-65 MG tablet Take 1 tablet by mouth every 6 (six) hours as needed for headache or migraine.    . Multiple Vitamin (MULTIVITAMIN WITH MINERALS) TABS tablet Take 1 tablet by mouth daily.    Marland Kitchen OVER THE COUNTER MEDICATION Take by mouth See admin instructions. Over the counter diet pills (Keto etc)      Drug Regimen Review Drug regimen was reviewed and remains appropriate with no significant issues identified  Home: Home Living Family/patient expects to be discharged to:: Private residence Living Arrangements: Spouse/significant other Available Help at Discharge: Family, Available PRN/intermittently Type of Home: House Home Access: Stairs  to enter Entrance Stairs-Number of Steps:  1 Entrance Stairs-Rails: None Home Layout: Two level, Able to live on main level with bedroom/bathroom Bathroom Shower/Tub: Chiropodist: Standard Home Equipment: None   Functional History: Prior Function Level of Independence: Independent Comments: Drives. Not working. Spouse works during the day but can stay home as needed.   Functional Status:  Mobility: Bed Mobility Overal bed mobility: Needs Assistance Bed Mobility: Supine to Sit Supine to sit: Supervision, HOB elevated General bed mobility comments: OOB with PT  Transfers Overall transfer level: Needs assistance Equipment used: None Transfers: Sit to/from Stand Sit to Stand: Min guard General transfer comment: MinGuard from EOB for safety Ambulation/Gait Ambulation/Gait assistance: Min assist Ambulation Distance (Feet): 120 Feet Assistive device: None Gait Pattern/deviations: Step-through pattern, Staggering right, Narrow base of support, Decreased stride length, Drifts right/left General Gait Details: Slow, guarded and unsteady gait with decreased arm swing and BUEs reaching for support; running into environment on right side- no awareness. LOB with dual tasking and cognitive tasks.  Gait velocity: decreased Gait velocity interpretation: Below normal speed for age/gender Stairs: Yes Stairs assistance: Min guard Stair Management: Step to pattern, Alternating pattern, Two rails Number of Stairs: 3(+ 2 steps x2 bouts) General stair comments: Cues for safety/technique.    ADL: ADL Overall ADL's : Needs assistance/impaired Eating/Feeding: Supervision/ safety, Set up, Sitting Grooming: Minimal assistance, Standing, Wash/dry face, Oral care Grooming Details (indicate cue type and reason): min steadying assist while standing at sink; verbal cues to initiate, follow through and to terminate tasks  Lower Body Bathing: Sit to/from stand, Min guard Upper Body Dressing : Min guard, Sitting Lower Body  Dressing: Minimal assistance, Sit to/from stand Lower Body Dressing Details (indicate cue type and reason): pt able to reach and adjust socks seated EOB  Toilet Transfer: Minimal assistance, Ambulation, Regular Toilet, Grab bars Toileting- Clothing Manipulation and Hygiene: Minimal assistance, Sit to/from stand Functional mobility during ADLs: Minimal assistance General ADL Comments: handoff from PT at start of session with Pt standing at sink to complete grooming ADLs; pt completing room level functional mobility with MinA   Cognition: Cognition Overall Cognitive Status: Impaired/Different from baseline Orientation Level: Oriented to place, Oriented to person Cognition Arousal/Alertness: Awake/alert Behavior During Therapy: WFL for tasks assessed/performed Overall Cognitive Status: Impaired/Different from baseline Area of Impairment: Orientation, Memory, Safety/judgement, Awareness, Problem solving, Attention, Following commands Orientation Level: Disoriented to, Time, Situation Current Attention Level: Selective Memory: Decreased short-term memory Following Commands: Follows multi-step commands inconsistently, Follows multi-step commands with increased time Safety/Judgement: Decreased awareness of deficits, Decreased awareness of safety Awareness: Intellectual Problem Solving: Slow processing, Requires verbal cues General Comments: pt initially able to state correct day, though when having pt read aloud food items from menu for today (Thursday) pt reading items for Friday and requires cues to recall that it is Tuesday. Pt able to read clock however does not correctly state time, instead reading minute and second numbers located closest to clock hands  Physical Exam: Blood pressure (!) 152/78, pulse 63, temperature 97.8 F (36.6 C), temperature source Axillary, resp. rate 16, height '5\' 6"'  (1.676 m), weight 99.5 kg (219 lb 5.7 oz), SpO2 96 %. Physical Exam Vitals reviewed. HENT:    Cranio site is dressed with dried blood, bruising, ecchymoses over left temple and peri-ocular area Eyes: EOM are normal.  Neck: Normal range of motion. Neck supple. No thyromegaly present.  Cardiovascular: Normal rate, regular rhythm and normal heart sounds.  Respiratory: Effort normal and breath sounds normal.  No respiratory distress.  GI: Soft. Bowel sounds are normal. She exhibits no distension/nontender Neurological: She is alert.  Patient is expressively aphasic. Occasional processing delays/apraxia. Able to convey basic thoughts using short phrases and words. With extra time, she can convey thoughts.  RUE 4/5 prox to distal. LUE 5/5. RLE: 4+/5. LLE 5/5. No sensory deficits Psych: pleasant and cooperative    Results for orders placed or performed during the hospital encounter of 12/31/17 (from the past 48 hour(s))  Glucose, capillary     Status: Abnormal   Collection Time: 01/05/18  3:17 AM  Result Value Ref Range   Glucose-Capillary 145 (H) 65 - 99 mg/dL  Glucose, capillary     Status: Abnormal   Collection Time: 01/05/18  7:44 AM  Result Value Ref Range   Glucose-Capillary 149 (H) 65 - 99 mg/dL  Glucose, capillary     Status: Abnormal   Collection Time: 01/05/18 12:07 PM  Result Value Ref Range   Glucose-Capillary 141 (H) 65 - 99 mg/dL   Comment 1 Notify RN    Comment 2 Document in Chart   Glucose, capillary     Status: Abnormal   Collection Time: 01/05/18  3:34 PM  Result Value Ref Range   Glucose-Capillary 206 (H) 65 - 99 mg/dL   Comment 1 Notify RN    Comment 2 Document in Chart   Glucose, capillary     Status: Abnormal   Collection Time: 01/05/18  9:19 PM  Result Value Ref Range   Glucose-Capillary 120 (H) 65 - 99 mg/dL   Comment 1 Notify RN    Comment 2 Document in Chart   Basic metabolic panel     Status: Abnormal   Collection Time: 01/06/18  2:54 AM  Result Value Ref Range   Sodium 138 135 - 145 mmol/L   Potassium 3.9 3.5 - 5.1 mmol/L   Chloride 104 101 -  111 mmol/L   CO2 26 22 - 32 mmol/L   Glucose, Bld 152 (H) 65 - 99 mg/dL   BUN 18 6 - 20 mg/dL   Creatinine, Ser 0.53 0.44 - 1.00 mg/dL   Calcium 8.4 (L) 8.9 - 10.3 mg/dL   GFR calc non Af Amer >60 >60 mL/min   GFR calc Af Amer >60 >60 mL/min    Comment: (NOTE) The eGFR has been calculated using the CKD EPI equation. This calculation has not been validated in all clinical situations. eGFR's persistently <60 mL/min signify possible Chronic Kidney Disease.    Anion gap 8 5 - 15    Comment: Performed at [facility] [address] [place], [place] [address]  Glucose, capillary     Status: Abnormal   Collection Time: 01/06/18  6:03 AM  Result Value Ref Range   Glucose-Capillary 139 (H) 65 - 99 mg/dL   Comment 1 Notify RN    Comment 2 Document in Chart   Glucose, capillary     Status: Abnormal   Collection Time: 01/06/18 11:37 AM  Result Value Ref Range   Glucose-Capillary 128 (H) 65 - 99 mg/dL   Comment 1 Notify RN    Comment 2 Document in Chart    No results found.     Medical Problem List and Plan: 1.  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 Formal pathology report pending.   -admit to inpatient rehab 2.  DVT Prophylaxis/Anticoagulation: SCDs. Monitor for any signs of DVT. Patient is  ambulatory. 3. Pain Management: Hydrocodone as needed 4. Mood: Provide emotional support 5. Neuropsych: This patient is capable of making decisions on her own behalf. 6. Skin/Wound Care: local care to crani incision/face 7. Fluids/Electrolytes/Nutrition: Routine I&O's with follow-up chemistries upon admit.  8. Seizure disorder. Keppra 500 mg twice a day. EEG negative 9. Tobacco abuse. Counseling as appropriate 10. Constipation. Laxative assistance    Post Admission Physician Evaluation: 1. Functional deficits secondary  to left frontal brain mass, left CR infarct. 2. Patient is admitted to  receive collaborative, interdisciplinary care between the physiatrist, rehab nursing staff, and therapy team. 3. Patient's level of medical complexity and substantial therapy needs in context of that medical necessity cannot be provided at a lesser intensity of care such as a SNF. 4. Patient has experienced substantial functional loss from his/her baseline which was documented above under the "Functional History" and "Functional Status" headings.  Judging by the patient's diagnosis, physical exam, and functional history, the patient has potential for functional progress which will result in measurable gains while on inpatient rehab.  These gains will be of substantial and practical use upon discharge  in facilitating mobility and self-care at the household level. 5. Physiatrist will provide 24 hour management of medical needs as well as oversight of the therapy plan/treatment and provide guidance as appropriate regarding the interaction of the two. 6. The Preadmission Screening has been reviewed and patient status is unchanged unless otherwise stated above. 7. 24 hour rehab nursing will assist with bladder management, bowel management, safety, skin/wound care, disease management, medication administration, pain management and patient education  and help integrate therapy concepts, techniques,education, etc. 8. PT will assess and treat for/with: Lower extremity strength, range of motion, stamina, balance, functional mobility, safety, adaptive techniques and equipment, NMR, family education.   Goals are: mod I. 9. OT will assess and treat for/with: ADL's, functional mobility, safety, upper extremity strength, adaptive techniques and equipment, NMR, family education.   Goals are: mod I. Therapy may proceed with showering this patient. 10. SLP will assess and treat for/with: speech, language, cognition, family education.  Goals are: supervision to min assist. 11. Case Management and Social Worker will assess  and treat for psychological issues and discharge planning. 12. Team conference will be held weekly to assess progress toward goals and to determine barriers to discharge. 13. Patient will receive at least 3 hours of therapy per day at least 5 days per week. 14. ELOS: 7-10 days       15. Prognosis:  excellent     Meredith Staggers, MD, [facility] 01/06/2018  Lavon Paganini Roodhouse, PA-C 01/06/2018

## 2018-01-07 ENCOUNTER — Inpatient Hospital Stay (HOSPITAL_COMMUNITY): Payer: Medicare Other | Admitting: Occupational Therapy

## 2018-01-07 ENCOUNTER — Inpatient Hospital Stay (HOSPITAL_COMMUNITY): Payer: Medicare Other | Admitting: Physical Therapy

## 2018-01-07 ENCOUNTER — Inpatient Hospital Stay (HOSPITAL_COMMUNITY): Payer: Medicare Other | Admitting: Speech Pathology

## 2018-01-07 DIAGNOSIS — R7989 Other specified abnormal findings of blood chemistry: Secondary | ICD-10-CM

## 2018-01-07 LAB — COMPREHENSIVE METABOLIC PANEL
ALT: 87 U/L — ABNORMAL HIGH (ref 14–54)
AST: 30 U/L (ref 15–41)
Albumin: 3 g/dL — ABNORMAL LOW (ref 3.5–5.0)
Alkaline Phosphatase: 75 U/L (ref 38–126)
Anion gap: 10 (ref 5–15)
BUN: 21 mg/dL — ABNORMAL HIGH (ref 6–20)
CO2: 26 mmol/L (ref 22–32)
Calcium: 8.4 mg/dL — ABNORMAL LOW (ref 8.9–10.3)
Chloride: 103 mmol/L (ref 101–111)
Creatinine, Ser: 0.64 mg/dL (ref 0.44–1.00)
GFR calc Af Amer: >60 mL/min (ref >60)
GFR calc non Af Amer: >60 mL/min (ref >60)
Glucose, Bld: 141 mg/dL — ABNORMAL HIGH (ref 65–99)
Potassium: 4.2 mmol/L (ref 3.5–5.1)
Sodium: 139 mmol/L (ref 135–145)
Total Bilirubin: 0.7 mg/dL (ref 0.3–1.2)
Total Protein: 5.7 g/dL — ABNORMAL LOW (ref 6.5–8.1)

## 2018-01-07 LAB — CBC WITH DIFFERENTIAL/PLATELET
Basophils Absolute: 0 10*3/uL (ref 0.0–0.1)
Basophils Relative: 0 %
Eosinophils Absolute: 0 10*3/uL (ref 0.0–0.7)
Eosinophils Relative: 0 %
HCT: 39.3 % (ref 36.0–46.0)
Hemoglobin: 12.8 g/dL (ref 12.0–15.0)
Lymphocytes Relative: 8 %
Lymphs Abs: 0.8 10*3/uL (ref 0.7–4.0)
MCH: 31.1 pg (ref 26.0–34.0)
MCHC: 32.6 g/dL (ref 30.0–36.0)
MCV: 95.4 fL (ref 78.0–100.0)
Monocytes Absolute: 0.8 10*3/uL (ref 0.1–1.0)
Monocytes Relative: 8 %
Neutro Abs: 8.3 10*3/uL — ABNORMAL HIGH (ref 1.7–7.7)
Neutrophils Relative %: 84 %
Platelets: 330 10*3/uL (ref 150–400)
RBC: 4.12 MIL/uL (ref 3.87–5.11)
RDW: 13.6 % (ref 11.5–15.5)
WBC: 9.8 10*3/uL (ref 4.0–10.5)

## 2018-01-07 MED ORDER — PANTOPRAZOLE SODIUM 40 MG PO TBEC
40.0000 mg | DELAYED_RELEASE_TABLET | Freq: Every day | ORAL | Status: DC
  Administered 2018-01-08 – 2018-01-12 (×5): 40 mg via ORAL
  Filled 2018-01-07 (×5): qty 1

## 2018-01-07 NOTE — Evaluation (Signed)
Speech Language Pathology Assessment and Plan  Patient Details  Name: Tara [name] MRN: [id number] Date of Birth: 10/10/1951  SLP Diagnosis: Cognitive Impairments;Aphasia  Rehab Potential: Excellent ELOS: 7 dyas    Today's Date: 01/07/2018 SLP Individual Time: 1030-1130 SLP Individual Time Calculation (min): 60 min   Problem List:  Patient Active Problem List   Diagnosis Date Noted  . Brain mass 01/06/2018  . Aphasia   . Abnormal CT of the head   . CNS mass 01/01/2018  . Acute metabolic encephalopathy 01/01/2018  . Headache 12/31/2017  . Hypertension 12/31/2017  . Hyperglycemia 12/31/2017  . Confusion 12/31/2017   Past Medical History: No past medical history on file. Past Surgical History:  Past Surgical History:  Procedure Laterality Date  . APPLICATION OF CRANIAL NAVIGATION N/A 01/04/2018   Procedure: APPLICATION OF CRANIAL NAVIGATION;  Surgeon: Stern, Joseph, MD;  Location: [facility];  Service: Neurosurgery;  Laterality: N/A;  . PR DURAL GRAFT REPAIR,SPINE DEFECT Left 01/04/2018   Procedure: Left Pterional craniotomy for biopsy with Brainlab;  Surgeon: Stern, Joseph, MD;  Location: [facility];  Service: Neurosurgery;  Laterality: Left;  Left Pterional craniotomy for biopsy with Brainlab    Assessment / Plan / Recommendation Clinical Impression Tara Flemingis a 66 y.o.right handed femalewith history of tobacco abuse on no prescription medications. Per chart review patient lives with spouse and was independent prior to admission. Two-level home with bedroom on main level. Spouse works during the day but can stay home as needed. Presented 12/31/2017 with confusion and slurred speech as well as left frontal headacheand right side weakness. CT/MRI demonstrated enhancing lesion with surrounding T2 signal consistent with likely mass. CT the chest with no finding suspicious for malignancy. CT abdomen pelvis unremarkable. Noted seizure 01/02/2018 loaded with Keppra. 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 Large area of edema in the left cerebral white matter similar to the recent MRI. 4 mm shift to the right unchanged.Underwent left craniotomy as well as biopsy for brain mass 01/04/2018 per Dr. Stern. Medical oncology consulted preliminary frozen path pathology nondiagnostic await formal pathology report.Plan suggesting aggressive chemotherapy.Decadron protocol as indicated. Tolerating a soft diet. Physicaland occupationaltherapy evaluationscompleted with recommendations of physical medicine rehabilitation consult.  Patient was admitted for a comprehensive rehabilitation program on 01/06/18. Bedside Swallow and speech-language evaluations completed on 01/07/18. Pt currently consumes regular diet without overt s/s of aspiration. No dysphagia goals are indicated at this time.    Pt presents with mild expressive aphasia > receptive aphasia d/t word finding difficulties within higher level language tasks. Pt is able to read and is also able to write at the word level with good legibility. Writing and semantic cues are the most helpful word finding strategies. Pt also presents with mild receptive language deficits as evidenced by difficulty following multi-step directions. Pt is able to select objects/pictures and answer yes/no questions with 100% accuracy. Pt also presents iwth mild cognitive deficits in higher level attention (selective) and intellectual/emergent awareness. Pt demonstrates poor awareness of cognitive/physical deficits and the impact on safety within everyday life.Pt presents with right-side facial weakness but is not impactful on speech intelligibility.  Given this, recommend 24 hour supervision at discharge.    Skilled Therapeutic Interventions          Skilled treatment session focused on completion of the above  mentioned evals, see above. Daughter present during evaluation   and extensive education provided to pt and daughter on areas of deficits and effective strategies to increase word finding abilities.    SLP Assessment  Patient will need skilled Speech Lanaguage Pathology Services during CIR admission    Recommendations  SLP Diet Recommendations: Age appropriate regular solids;Thin Liquid Administration via: Cup;Straw Medication Administration: Whole meds with puree Supervision: Intermittent supervision to cue for compensatory strategies Compensations: Slow rate;Small sips/bites Postural Changes and/or Swallow Maneuvers: Seated upright 90 degrees Oral Care Recommendations: Oral care BID Recommendations for Other Services: Neuropsych consult Patient destination: Home Follow up Recommendations: Outpatient SLP;24 hour supervision/assistance Equipment Recommended: None recommended by SLP    SLP Frequency 3 to 5 out of 7 days   SLP Duration  SLP Intensity  SLP Treatment/Interventions 7 dyas  Minumum of 1-2 x/day, 30 to 90 minutes  Cognitive remediation/compensation;Speech/Language facilitation;Therapeutic Activities;Patient/family education    Pain    Prior Functioning Cognitive/Linguistic Baseline: Within functional limits Type of Home: House  Lives With: Significant other Available Help at Discharge: Available 24 hours/day;Friend(s);Family Vocation: Retired  Function:  Eating Eating   Modified Consistency Diet: No Eating Assist Level: Set up assist for           Cognition Comprehension Comprehension assist level: Follows basic conversation/direction with extra time/assistive device  Expression   Expression assist level: Expresses basic 90% of the time/requires cueing < 10% of the time.;Expresses basic needs/ideas: With extra time/assistive device  Social Interaction Social Interaction assist level: Interacts appropriately 90% of the time - Needs monitoring or  encouragement for participation or interaction.  Problem Solving Problem solving assist level: Solves basic 90% of the time/requires cueing < 10% of the time  Memory Memory assist level: Recognizes or recalls 90% of the time/requires cueing < 10% of the time   Short Term Goals: Week 1: SLP Short Term Goal 1 (Week 1): Given supervision cues, pt will utilize word finding strategies to convey semi-complex information.  SLP Short Term Goal 2 (Week 1): Pt will follow 2 step directions with >90% accuracy and supervision level cues. SLP Short Term Goal 3 (Week 1): Pt will demonstrate selective attention in moderately distracting environment for 45 minutes with supervision level cues.  SLP Short Term Goal 4 (Week 1): Pt will demonstrate intellectual awareness/emergent awareness by listing 3 phsycial and 3 speech-language deficits and their impact on safety within dialy living with supervision cues.  SLP Short Term Goal 5 (Week 1): Pt will self-monitor and self-correct verbal erros with supervision level cues.   Refer to Care Plan for Long Term Goals  Recommendations for other services: Neuropsych  Discharge Criteria: Patient will be discharged from SLP if patient refuses treatment 3 consecutive times without medical reason, if treatment goals not met, if there is a change in medical status, if patient makes no progress towards goals or if patient is discharged from hospital.  The above assessment, treatment plan, treatment alternatives and goals were discussed and mutually agreed upon: by patient and by family  Nawal Burling 01/07/2018, 2:30 PM

## 2018-01-07 NOTE — Progress Notes (Addendum)
[facility] PHYSICAL MEDICINE & REHABILITATION     PROGRESS NOTE    Subjective/Complaints: Had a reasonable night. Is homesick. Denies pain, able to sleep last night  ROS: pt denies nausea, vomiting, diarrhea, cough, shortness of breath or chest pain, rash, blurred vision, mood change   Objective: Vital Signs: Blood pressure (!) 168/91, pulse 70, temperature 98.1 F (36.7 C), temperature source Oral, resp. rate 17, height 5\' 6"  (1.676 m), weight 96.6 kg (212 lb 15.4 oz), SpO2 95 %. No results found. Recent Labs    01/07/18 0539  WBC 9.8  HGB 12.8  HCT 39.3  PLT 330   Recent Labs    01/06/18 0254 01/07/18 0539  NA 138 139  K 3.9 4.2  CL 104 103  GLUCOSE 152* 141*  BUN 18 21*  CREATININE 0.53 0.64  CALCIUM 8.4* 8.4*   CBG (last 3)  Recent Labs    01/05/18 2119 01/06/18 0603 01/06/18 1137  GLUCAP 120* 139* 128*    Wt Readings from Last 3 Encounters:  01/06/18 96.6 kg (212 lb 15.4 oz)  01/05/18 99.5 kg (219 lb 5.7 oz)    Physical Exam:  HENT: Crani-site with tegaderm/dressing, coming loose, some underlying sero-sang discharge Eyes:left per-ocular bruising/swelling Neck:supple Cardiovascular:Normal rate,regular rhythmand normal heart sounds.  Respiratory:Effort normaland breath sounds normal. Norespiratory distress.  GI:BS +, non-tender, non-distended  Neurological: She isalert. Expressive language deficits but is able to express fluidly in short spurts. RUE 4/5 prox to distal. LUE 5/5. RLE: 4+/5. LLE 5/5. No sensory deficits Psych: pleasant and cooperative. A little anxious    Assessment/Plan: 1. 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 Physiatrist is providing close team supervision and 24 hour management of active medical problems listed below. Physiatrist and rehab team continue to assess barriers to  discharge/monitor patient progress toward functional and medical goals.  Function:  Bathing Bathing position      Bathing parts      Bathing assist        Upper Body Dressing/Undressing Upper body dressing                    Upper body assist        Lower Body Dressing/Undressing Lower body dressing                                  Lower body assist        Toileting Toileting Toileting activity did not occur: No continent bowel/bladder event        Toileting assist     Transfers Chair/bed Clinical biochemist          Cognition Comprehension Comprehension assist level: Follows basic conversation/direction with extra time/assistive device  Expression Expression assist level: Expresses basic needs/ideas: With extra time/assistive device  Social Interaction Social Interaction assist level: Interacts appropriately 90% of the time - Needs monitoring or encouragement for participation or interaction.  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 Formal pathology report pending. -beginning therapies today.  Short LOS 2. DVT Prophylaxis/Anticoagulation: SCDs. ambulating 3. Pain Management:Hydrocodone as needed for severe pain 4. Mood:Provide emotional support, positive reinforcement 5. Neuropsych: This patientiscapable of making decisions on herown behalf. 6. Skin/Wound Care:remove crani dressing, apply foam or dry dressing as appropriate 7. Fluids/Electrolytes/Nutrition: I personally reviewed the patient's labs today.     -BUN sl elevated----encourage  PO fluids   -recheck bmet on Monday  8.Seizure disorder. Keppra 500 mg twice a day. EEG negative 9.Tobacco abuse. Counseling as appropriate 10.Constipation. Laxative assistance   LOS (Days) 1 A FACE TO FACE EVALUATION WAS PERFORMED  Meredith Staggers, MD 01/07/2018 10:24 AM

## 2018-01-07 NOTE — IPOC Note (Signed)
Overall Plan of Care [facility]) Patient Details Name: JELICIA NANTZ MRN: [id number] DOB: [birth year]  Admitting Diagnosis: <principal problem not specified> left frontal brain mass  Hospital Problems: Active Problems:   Brain mass   Aphasia     Functional Problem List: Nursing Bowel, Edema, Endurance, Medication Management, Motor, Safety, Perception, Skin Integrity  PT Balance, Behavior, Edema, Endurance, Motor, Pain, Perception, Safety, Skin Integrity, Sensory  OT Balance, Pain, Cognition, Sensory, Endurance, Edema, Motor  SLP Cognition, Perception, Safety  TR         Basic ADL's: OT Eating, Grooming, Bathing, Dressing, Toileting     Advanced  ADL's: OT       Transfers: PT Bed Mobility, Bed to Chair, Car, [employer], Floor  OT Toilet, Metallurgist: PT Ambulation, Emergency planning/management officer, Stairs     Additional Impairments: OT Fuctional Use of Upper Extremity  SLP Communication, Social Cognition comprehension, expression Attention, Awareness  TR      Anticipated Outcomes Item Anticipated Outcome  Self Feeding Set-up  Swallowing      Basic self-care  Supervision-mod I  Insurance underwriter Transfers Supervision  Bowel/Bladder  Mod I assist  Transfers  Mod I with LRAD   Locomotion  Mod I at household level with RLAD   Communication  Supervision  Cognition  Supervision  Pain  < 3  Safety/Judgment  Supervision   Therapy Plan: PT Intensity: Minimum of 1-2 x/day ,45 to 90 minutes PT Frequency: 5 out of 7 days PT Duration Estimated Length of Stay: 6-8 days  OT Intensity: Minimum of 1-2 x/day, 45 to 90 minutes OT Frequency: 5 out of 7 days OT Duration/Estimated Length of Stay: 7-10 days SLP Intensity: Minumum of 1-2 x/day, 30 to 90 minutes SLP Frequency: 3 to 5 out of 7 days SLP Duration/Estimated Length of Stay: 7 dyas    Team Interventions: Nursing Interventions Patient/Family Education, Bowel Management, Disease  Management/Prevention, Cognitive Remediation/Compensation, Skin Care/Wound Management, Medication Management, Dysphagia/Aspiration Precaution Training  PT interventions Ambulation/gait training, Community reintegration, DME/adaptive equipment instruction, Psychosocial support, Neuromuscular re-education, Stair training, UE/LE Strength taining/ROM, Wheelchair propulsion/positioning, UE/LE Coordination activities, Therapeutic Activities, Functional electrical stimulation, Pain management, Skin care/wound management, Discharge planning, Cognitive remediation/compensation, Training and development officer, Disease management/prevention, Functional mobility training, Splinting/orthotics, Patient/family education, Therapeutic Exercise, Visual/perceptual remediation/compensation  OT Interventions Balance/vestibular training, Discharge planning, Pain management, Self Care/advanced ADL retraining, Therapeutic Activities, UE/LE Coordination activities, Therapeutic Exercise, Patient/family education, Functional mobility training, Disease mangement/prevention, Cognitive remediation/compensation, Community reintegration, Engineer, drilling, Neuromuscular re-education, Psychosocial support, Splinting/orthotics, UE/LE Strength taining/ROM  SLP Interventions Cognitive remediation/compensation, Speech/Language facilitation, Therapeutic Activities, Patient/family education  TR Interventions    SW/CM Interventions Discharge Planning, Psychosocial Support, Patient/Family Education   Barriers to Discharge MD  Medical stability  Nursing      PT Inaccessible home environment, Decreased caregiver support, Home environment access/layout    OT Pending chemo/radiation    SLP      SW       Team Discharge Planning: Destination: PT-Home ,OT- Home , SLP-Home Projected Follow-up: PT-Outpatient PT, OT-  Outpatient OT, SLP-Outpatient SLP, 24 hour supervision/assistance Projected Equipment Needs: PT-To be determined,  Cane, OT- To be determined, SLP-None recommended by SLP Equipment Details: PT- , OT-  Patient/family involved in discharge planning: PT- Patient, Family member/caregiver,  OT-Patient, Family member/caregiver, SLP-Patient  MD ELOS: 7-10 days Medical Rehab Prognosis:  Excellent Assessment: The patient has been admitted for CIR therapies with the diagnosis of left frontal brain mass. The team will be addressing  functional mobility, strength, stamina, balance, safety, adaptive techniques and equipment, self-care, bowel and bladder mgt, patient and caregiver education, NMR, language, comunication, mood, pain control. Goals have been set at supervision with basic self-care/ADl's and language, mod I with locomotion and transfers. Meredith Staggers, MD, FAAPMR      See Team Conference Notes for weekly updates to the plan of care

## 2018-01-07 NOTE — Care Management Note (Signed)
Inpatient Rehabilitation Center Individual Statement of Services  Patient Name:  Tara [name]  Date:  01/07/2018  Welcome to the [facility].  Our goal is to provide you with an individualized program based on your diagnosis and situation, designed to meet your specific needs.  With this comprehensive rehabilitation program, you will be expected to participate in at least 3 hours of rehabilitation therapies Monday-Friday, with modified therapy programming on the weekends.  Your rehabilitation program will include the following services:  Physical Therapy (PT), Occupational Therapy (OT), Speech Therapy (ST), 24 hour per day rehabilitation nursing, Case Management (Social Worker), Rehabilitation Medicine, Nutrition Services and Pharmacy Services  Weekly team conferences will be held on Wednesday to discuss your progress.  Your Social Worker will talk with you frequently to get your input and to update you on team discussions.  Team conferences with you and your family in attendance may also be held.  Expected length of stay: 6-8  days  Overall anticipated outcome: mod/i-supervision level  Depending on your progress and recovery, your program may change. Your Social Worker will coordinate services and will keep you informed of any changes. Your Social Worker's name and contact numbers are listed  below.  The following services may also be recommended but are not provided by the [facility] will be made to provide these services after discharge if needed.  Arrangements include referral to agencies that provide these services.  Your insurance has been verified to be:  Medicare Your primary doctor is:    Pertinent information will be shared with your doctor and your insurance company.  Social Worker:  Ovidio Kin, [place] or ([phone]  Information discussed with and copy given to patient by: [name], 01/07/2018, 12:04 PM

## 2018-01-07 NOTE — Progress Notes (Signed)
Social Work  Social Work Assessment and Plan  Patient Details  Name: Tara [name] MRN: [id number] Date of Birth: April 16, 1951  Today's Date: 01/07/2018  Problem List:  Patient Active Problem List   Diagnosis Date Noted  . Brain mass 01/06/2018  . Aphasia   . Abnormal CT of the head   . CNS mass 01/01/2018  . Acute metabolic encephalopathy 27/25/3664  . Headache 12/31/2017  . Hypertension 12/31/2017  . Hyperglycemia 12/31/2017  . Confusion 12/31/2017   Past Medical History: No past medical history on file. Past Surgical History:  Past Surgical History:  Procedure Laterality Date  . APPLICATION OF CRANIAL NAVIGATION N/A 01/04/2018   Procedure: APPLICATION OF CRANIAL NAVIGATION;  Surgeon: Erline Levine, MD;  Location: [facility];  Service: Neurosurgery;  Laterality: N/A;  . PR DURAL GRAFT REPAIR,SPINE DEFECT Left 01/04/2018   Procedure: Left Pterional craniotomy for biopsy with Brainlab;  Surgeon: Erline Levine, MD;  Location: [facility];  Service: Neurosurgery;  Laterality: Left;  Left Pterional craniotomy for biopsy with Brainlab   Social History:  reports that she has been smoking cigarettes.  She has been smoking about 1.00 pack per day. she has never used smokeless tobacco. She reports that she does not drink alcohol or use drugs.  Family / Support Systems Marital Status: Married Patient Roles: Spouse, Parent Spouse/Significant Other: Clifford (Bud) [phone] Children: Zach-son [phone] Other Supports: Three other children out of town  Anticipated Caregiver: Family Ability/Limitations of Caregiver: husband is self employed and can be flexible in his schedule, if 24 hr care needed he can do this Caregiver Availability: 24/7 Family Dynamics: Close knit family all pull toether when times of need, although it is usually pt who is assisting them. Not the one who needs assistance. They have close friends and church members who are supportive and will visit and help  out  Social History Preferred language: English Religion:  Cultural Background: No issues Education: Secretary/administrator educated Read: Yes Write: Yes Employment Status: Unemployed Date Retired/Disabled/Unemployed: Primary school teacher; work Freight forwarder Issues: No issues Guardian/Conservator: None-according to MD pt is capable of making her own decisions while here.   Abuse/Neglect Abuse/Neglect Assessment Can Be Completed: Yes Physical Abuse: Denies Verbal Abuse: Denies Sexual Abuse: Denies Exploitation of patient/patient's resources: Denies Self-Neglect: Denies  Emotional Status Pt's affect, behavior adn adjustment status: Pt is motivated to do well and feels she is doing remarkable well since surgery. Daughter is here from [place] and feels she is also. She has always been independent and taken care of others this is hard for her in this role. Pt plans to be mod/i by the time she leaves rehab. Recent Psychosocial Issues: healthy prior to admission didn't go to the MD Pyschiatric History: No history deferred depression screen due to pt doing well and will be a short length of stay here. Will monitor her coping and see if would benefit from seeing neuro-psych while here. Substance Abuse History: No issues  Patient / Family Perceptions, Expectations & Goals Pt/Family understanding of illness & functional limitations: Pt and daughter can explain her surgery and reaosn for it. They are awaiting path report to find out next step in her treatment process. Pt, children and husband all talk with the MD's and feel they know what is going on at this time and going forward. Premorbid pt/family roles/activities: Wife, Mom, grandmother, retiree, church member, friend, etc Anticipated changes in roles/activities/participation: resume Pt/family expectations/goals: Pt states: " I want to be able to take care of myself, I  am getting there."  Daughter states: " I hope she does well she already has and is  making progress daily."  [employer]: None Premorbid Home Care/DME Agencies: None Transportation available at discharge: Family Resource referrals recommended: Support group (specify)  Discharge Planning Living Arrangements: Spouse/significant other Support Systems: Spouse/significant other, Children, Other relatives, Water engineer, Social worker community Type of Residence: Private residence Insurance Resources: Chartered certified accountant Resources: [place] Referred: Yes Living Expenses: Own Money Management: Spouse, Patient Does the patient have any problems obtaining your medications?: No Home Management: Self Patient/Family Preliminary Plans: Retrun home with husband who can flex his schedule for whatever pt needs to discharge. She is doing well and making daily progress. She is looking forward to rehab and making even more progress. Waiting to hear the path report to know what the next step in her jourrney will be. Social Work Anticipated Follow Up Needs: HH/OP, Support Group  Clinical Impression Pleasant female who is motivated and positive in her recovery. She has a very supportive and involved family who will assist at discharge. Pt will be a short length of stay here and will need OP therapies, will go ahead and make referral for this, so there is no lag time between.  [name] 01/07/2018, 2:46 PM

## 2018-01-07 NOTE — Evaluation (Signed)
Physical Therapy Assessment and Plan  Patient Details  Name: Tara [name] MRN: [id number] Date of Birth: 06-21-1951  PT Diagnosis: Abnormal posture, Abnormality of gait, Coordination disorder and Hemiplegia dominant Rehab Potential: Excellent ELOS: 6-8 days    Today's Date: 01/07/2018 PT Individual Time: 0805-0905 PT Individual Time Calculation (min): 60 min    Problem List:  Patient Active Problem List   Diagnosis Date Noted  . Brain mass 01/06/2018  . Aphasia   . Abnormal CT of the head   . CNS mass 01/01/2018  . Acute metabolic encephalopathy 80/32/1224  . Headache 12/31/2017  . Hypertension 12/31/2017  . Hyperglycemia 12/31/2017  . Confusion 12/31/2017    Past Medical History: No past medical history on file. Past Surgical History:  Past Surgical History:  Procedure Laterality Date  . APPLICATION OF CRANIAL NAVIGATION N/A 01/04/2018   Procedure: APPLICATION OF CRANIAL NAVIGATION;  Surgeon: Erline Levine, MD;  Location: [place];  Service: Neurosurgery;  Laterality: N/A;  . PR DURAL GRAFT REPAIR,SPINE DEFECT Left 01/04/2018   Procedure: Left Pterional craniotomy for biopsy with Brainlab;  Surgeon: Erline Levine, MD;  Location: [place];  Service: Neurosurgery;  Laterality: Left;  Left Pterional craniotomy for biopsy with Brainlab    Assessment & Plan Clinical Impression: Patient is a 67 y.o.right handed femalewith history of tobacco abuse on no prescription medications. Per chart review patient lives with spouse and was independent prior to admission. Two-level home with bedroom on main level. Spouse works during the day but can stay home as needed. Presented 12/31/2017 with confusion and slurred speech as well as left frontal headacheand right side weakness. CT/MRI demonstrated enhancing lesion with surrounding T2 signal consistent with likely mass. CT the chest with no finding suspicious for malignancy. CT abdomen pelvis unremarkable. Noted seizure 01/02/2018 loaded with  Keppra. 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 Large area of edema in the left cerebral white matter similar to the recent MRI. 4 mm shift to the right unchanged.Underwent left craniotomy as well as biopsy for brain mass 01/04/2018 per Dr. Vertell Limber. Medical oncology consulted preliminary frozen path pathology nondiagnostic await formal pathology report.Plan suggesting aggressive chemotherapy.   Patient transferred to CIR on 01/06/2018 .   Patient currently requires min with mobility secondary to muscle weakness, impaired timing and sequencing and decreased coordination, decreased attention to right and decreased standing balance, hemiplegia and decreased balance strategies.  Prior to hospitalization, patient was independent  with mobility and lived with Significant other in a House home.  Home access is  Stairs to enter.  Patient will benefit from skilled PT intervention to maximize safe functional mobility, minimize fall risk and decrease caregiver burden for planned discharge home with intermittent assist.  Anticipate patient will benefit from follow up [facility] at discharge.  PT - End of Session Activity Tolerance: Tolerates 10 - 20 min activity with multiple rests PT Assessment Rehab Potential (ACUTE/IP ONLY): Excellent PT Barriers to Discharge: Inaccessible home environment;Decreased caregiver support;Home environment access/layout PT Patient demonstrates impairments in the following area(s): Balance;Behavior;Edema;Endurance;Motor;Pain;Perception;Safety;Skin Integrity;Sensory PT Transfers Functional Problem(s): Bed Mobility;Bed to Chair;Car;Furniture;Floor PT Locomotion Functional Problem(s): Ambulation;Wheelchair Mobility;Stairs PT Plan PT Intensity: Minimum of 1-2 x/day ,45 to 90 minutes PT Frequency: 5 out of 7 days PT Duration Estimated Length of  Stay: 6-8 days  PT Treatment/Interventions: Ambulation/gait training;Community reintegration;DME/adaptive equipment instruction;Psychosocial support;Neuromuscular re-education;Stair training;UE/LE Strength taining/ROM;Wheelchair propulsion/positioning;UE/LE Coordination activities;Therapeutic Activities;Functional electrical  stimulation;Pain management;Skin care/wound management;Discharge planning;Cognitive remediation/compensation;Balance/vestibular training;Disease management/prevention;Functional mobility training;Splinting/orthotics;Patient/family education;Therapeutic Exercise;Visual/perceptual remediation/compensation PT Transfers Anticipated Outcome(s): Mod I with LRAD  PT Locomotion Anticipated Outcome(s): Mod I at household level with RLAD  PT Recommendation Recommendations for Other Services: Therapeutic Recreation consult Therapeutic Recreation Interventions: Outing/community reintergration Follow Up Recommendations: Outpatient PT Patient destination: Home Equipment Recommended: To be determined;Cane  Skilled Therapeutic Intervention PT instructed patient in PT Evaluation and initiated treatment intervention; see below for results. PT educated patient in [place], rehab potential, rehab goals, and discharge recommendations.  Patient demonstrates increased fall risk as noted by score of  53 /56 on Berg Balance Scale.  (<36= high risk for falls, close to 100%; 37-45 significant >80%; 46-51 moderate >50%; 52-55 lower >25%).  Pt received sitting in WC and agreeable to PT    PT Evaluation Precautions/Restrictions Precautions Precautions: Fall Precaution Comments: Mild right inattention Restrictions Weight Bearing Restrictions: No General   Vital Signs Pain Pain Assessment Pain Assessment: No/denies pain Faces Pain Scale: No hurt Home Living/Prior Functioning Home Living Available Help at Discharge: Available 24 hours/day;Friend(s);Family Type of Home: House Home Access: Stairs to  enter Home Layout: Multi-level;Laundry or work area in basement;Full bath on main level;Able to live on main level with bedroom/bathroom Alternate Level Stairs-Number of Steps: Pt reports multi level split floor plan with 4 levels. ~ 1/2 staircase between levels  Alternate Level Stairs-Rails: Right Bathroom Shower/Tub: Tub/shower unit;Walk-in shower Bathroom Toilet: Handicapped height Bathroom Accessibility: Yes  Lives With: Significant other Prior Function Level of Independence: Independent with basic ADLs;Independent with homemaking with ambulation;Independent with gait  Able to Take Stairs?: Yes Driving: Yes Vocation: Retired Comments: spends a lot of time with grandchilden  Vision/Perception  Vision - Assessment Eye Alignment: Within Functional Limits Tracking/Visual Pursuits: Able to track stimulus in all quads without difficulty Perception Perception: Impaired Inattention/Neglect: Does not attend to right side of body(mild R inattention) Praxis Praxis: Intact  Sensation Sensation Light Touch: Appears Intact Proprioception: Appears Intact Coordination Gross Motor Movements are Fluid and Coordinated: No Fine Motor Movements are Fluid and Coordinated: No Coordination and Movement Description: mild dysmetria in the RLE and RUE  Heel Shin Test: decreased speed and accuracy in RLE Motor  Motor Motor: Hemiplegia Motor - Skilled Clinical Observations: R hemiplegia.   Mobility Bed Mobility Bed Mobility: Rolling Right;Rolling Left;Supine to Sit;Sit to Supine Rolling Right: 5: Supervision Rolling Left: 5: Supervision Supine to Sit: 5: Supervision Sit to Supine: 5: Supervision Transfers Transfers: Yes Sit to Stand: 4: Min assist Sit to Stand Details: Verbal cues for technique;Verbal cues for precautions/safety Stand Pivot Transfers: 4: Min assist Stand Pivot Transfer Details: Verbal cues for precautions/safety;Verbal cues for technique Stand Pivot Transfer Details (indicate  cue type and reason): Car transfer with min assist from PT. Min cues for safety from PT Locomotion  Ambulation Ambulation: Yes Ambulation/Gait Assistance: 4: Min assist Ambulation Distance (Feet): 150 Feet Assistive device: None Gait Gait: Yes Gait Pattern: Ataxic;Wide base of support Stairs / Additional Locomotion Stairs: Yes Stairs Assistance: 4: Min assist Stairs Assistance Details: Verbal cues for gait pattern;Verbal cues for safe use of DME/AE;Verbal cues for precautions/safety Stair Management Technique: Two rails Number of Stairs: 12 Wheelchair Mobility Wheelchair Mobility: No  Trunk/Postural Assessment  Cervical Assessment Cervical Assessment: Exceptions to WFL(forward head) Thoracic Assessment Thoracic Assessment: Exceptions to WFL(rounded shoulders) Lumbar Assessment Lumbar Assessment: Exceptions to [facility] Postural Control Postural Control: Within Functional Limits  Balance Balance Balance Assessed: Yes Standardized Balance Assessment Standardized Balance Assessment: Oceanographer Test [facility] Balance  Test Sit to Stand: Able to stand without using hands and stabilize independently Standing Unsupported: Able to stand safely 2 minutes Sitting with Back Unsupported but Feet Supported on Floor or Stool: Able to sit safely and securely 2 minutes Stand to Sit: Sits safely with minimal use of hands Transfers: Able to transfer safely, minor use of hands Standing Unsupported with Eyes Closed: Able to stand 10 seconds with supervision Standing Ubsupported with Feet Together: Able to place feet together independently but unable to hold for 30 seconds From Standing, Reach Forward with Outstretched Arm: Can reach confidently >25 cm (10") From Standing Position, Pick up Object from Floor: Able to pick up shoe, needs supervision From Standing Position, Turn to Look Behind Over each Shoulder: Looks behind one side only/other side shows less weight shift Turn 360 Degrees: Able to turn  360 degrees safely one side only in 4 seconds or less Standing Unsupported, Alternately Place Feet on Step/Stool: Able to complete >2 steps/needs minimal assist Standing Unsupported, One Foot in Front: Able to take small step independently and hold 30 seconds Standing on One Leg: Tries to lift leg/unable to hold 3 seconds but remains standing independently Total Score: 42 Static Sitting Balance Static Sitting - Level of Assistance: 6: Modified independent (Device/Increase time) Dynamic Sitting Balance Dynamic Sitting - Level of Assistance: 6: Modified independent (Device/Increase time) Static Standing Balance Static Standing - Level of Assistance: 5: Stand by assistance Dynamic Standing Balance Dynamic Standing - Level of Assistance: 4: Min assist Dynamic Standing - Balance Activities: Reaching for objects Extremity Assessment      RLE Assessment RLE Assessment: Exceptions to [facility] RLE Strength RLE Overall Strength Comments: 5/5 proximal to distal except knee flexion and Hip flexionl 4+/5  LLE Assessment LLE Assessment: Within Functional Limits   See Function Navigator for Current Functional Status.   Refer to Care Plan for Long Term Goals  Recommendations for other services: Therapeutic Recreation  Outing/community reintegration  Discharge Criteria: Patient will be discharged from PT if patient refuses treatment 3 consecutive times without medical reason, if treatment goals not met, if there is a change in medical status, if patient makes no progress towards goals or if patient is discharged from hospital.  The above assessment, treatment plan, treatment alternatives and goals were discussed and mutually agreed upon: by patient and by family  Lorie Phenix 01/07/2018, 10:33 AM

## 2018-01-07 NOTE — Evaluation (Signed)
Occupational Therapy Assessment and Plan  Patient Details  Name: Tara [name] MRN: [id number] Date of Birth: 18-Aug-1951  OT Diagnosis: abnormal posture, apraxia, ataxia, cognitive deficits, hemiplegia affecting dominant side and muscle weakness (generalized) Rehab Potential: Rehab Potential (ACUTE ONLY): Good ELOS: 7-10 days   Today's Date: 01/07/2018 OT Individual Time: 1300-1415 OT Individual Time Calculation (min): 75 min     Problem List:  Patient Active Problem List   Diagnosis Date Noted  . Brain mass 01/06/2018  . Aphasia   . Abnormal CT of the head   . CNS mass 01/01/2018  . Acute metabolic encephalopathy 95/28/4132  . Headache 12/31/2017  . Hypertension 12/31/2017  . Hyperglycemia 12/31/2017  . Confusion 12/31/2017    Past Medical History: No past medical history on file. Past Surgical History:  Past Surgical History:  Procedure Laterality Date  . APPLICATION OF CRANIAL NAVIGATION N/A 01/04/2018   Procedure: APPLICATION OF CRANIAL NAVIGATION;  Surgeon: Erline Levine, MD;  Location: [place];  Service: Neurosurgery;  Laterality: N/A;  . PR DURAL GRAFT REPAIR,SPINE DEFECT Left 01/04/2018   Procedure: Left Pterional craniotomy for biopsy with Brainlab;  Surgeon: Erline Levine, MD;  Location: [place];  Service: Neurosurgery;  Laterality: Left;  Left Pterional craniotomy for biopsy with Brainlab    Assessment & Plan Clinical Impression: Tara J Matthewsis a 67 y.o.right handed femalewith history of tobacco abuse on no prescription medications. Per chart review patient lives with spouse and was independent prior to admission. Two-level home with bedroom on main level. Spouse works during the day but can stay home as needed. Presented 12/31/2017 with confusion and slurred speech as well as left frontal headacheand right side weakness. CT/MRI demonstrated enhancing lesion with surrounding T2 signal consistent with likely mass. CT the chest with no finding suspicious for  malignancy. CT abdomen pelvis unremarkable. Noted seizure 01/02/2018 loaded with Keppra. 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 Large area of edema in the left cerebral white matter similar to the recent MRI. 4 mm shift to the right unchanged.Underwent left craniotomy as well as biopsy for brain mass 01/04/2018 per Dr. Vertell Limber. Medical oncology consulted preliminary frozen path pathology nondiagnostic await formal pathology report.Plan suggesting aggressive chemotherapy.Decadron protocol as indicated. Tolerating a soft diet. Physicaland occupationaltherapy evaluationscompleted with recommendations of physical medicine rehabilitation consult.Patient was admitted for a comprehensive rehabilitation program. Patient transferred to CIR on 01/06/2018 .    Patient currently requires min with basic self-care skills secondary to muscle weakness, decreased cardiorespiratoy endurance, ataxia, decreased coordination and decreased motor planning, decreased awareness, decreased problem solving, decreased safety awareness and delayed processing and decreased sitting balance, decreased standing balance, decreased postural control, hemiplegia and decreased balance strategies.  Prior to hospitalization, patient could complete ADls/IADLs with independent .  Patient will benefit from skilled intervention to decrease level of assist with basic self-care skills and increase independence with basic self-care skills prior to discharge home independently.  Anticipate patient will require 24 hour supervision and follow up outpatient.  OT - End of Session Activity Tolerance: Tolerates 10 - 20 min activity with multiple rests Endurance Deficit: Yes Endurance Deficit Description: Rest breaks required throughout bathing/dressing session.  OT Assessment Rehab Potential (ACUTE ONLY):  Good OT Barriers to Discharge: Pending chemo/radiation OT Patient demonstrates impairments in the following area(s): Balance;Pain;Cognition;Sensory;Endurance;Edema;Motor OT Basic ADL's Functional Problem(s): Eating;Grooming;Bathing;Dressing;Toileting OT Transfers Functional Problem(s): Toilet;Tub/Shower OT Additional Impairment(s): Fuctional Use  of Upper Extremity OT Plan OT Intensity: Minimum of 1-2 x/day, 45 to 90 minutes OT Frequency: 5 out of 7 days OT Duration/Estimated Length of Stay: 7-10 days OT Treatment/Interventions: Balance/vestibular training;Discharge planning;Pain management;Self Care/advanced ADL retraining;Therapeutic Activities;UE/LE Coordination activities;Therapeutic Exercise;Patient/family education;Functional mobility training;Disease mangement/prevention;Cognitive remediation/compensation;Community reintegration;DME/adaptive equipment instruction;Neuromuscular re-education;Psychosocial support;Splinting/orthotics;UE/LE Strength taining/ROM OT Self Feeding Anticipated Outcome(s): Set-up OT Basic Self-Care Anticipated Outcome(s): Supervision-mod I OT Toileting Anticipated Outcome(s): Supervision OT Bathroom Transfers Anticipated Outcome(s): Supervision OT Recommendation Recommendations for Other Services: Neuropsych consult;Therapeutic Recreation consult Therapeutic Recreation Interventions: Stress management Patient destination: Home Follow Up Recommendations: Outpatient OT Equipment Recommended: To be determined   Skilled Therapeutic Intervention Pt seen for OT eval and ADL bathing/dressing session. Pt sitting up in w/c upon arrival with family members present, agreeable to tx session and denying pain. She ambulated throughout session with HHA, VCs for safety awareness due to impulsivity and pt constantly reaching out to steady herself on furniture and walls. She bathed seated on tub bench, standing with steadying assist and use of grab bars to complete pericare/  buttock hygiene. She dressed seated in w/c, standing with steadying assist to pull pants up. Rest break required throughout bathing/dressing due to decreased activity tolerance. Completed 9 hole peg test, see results below. Pt provided with red built up gripper for utensils and education provided regarding use and how to incoporate R UE into functional tasks. Also provided with small foam blocks to practice pincer grasp and fine motor skills outside of tx times. Pt left seated in w/c at end of session, left set-up with meal tray, all needs in reach, daughter present and QRB donned. Education provided throughout session regarding tole of OT, POC, OT/PT/SLP goal, IPR, CVA recovery, continuum of care, and d/c planning.   OT Evaluation Precautions/Restrictions  Precautions Precautions: Fall Precaution Comments: Mild right inattention Restrictions Weight Bearing Restrictions: No General Chart Reviewed: Yes Home Living/Prior Functioning Home Living Family/patient expects to be discharged to:: Private residence Living Arrangements: Spouse/significant other Available Help at Discharge: Available 24 hours/day, Friend(s), Family Type of Home: House Home Access: Stairs to enter Technical brewer of Steps: 1 Entrance Stairs-Rails: None Home Layout: Multi-level, Laundry or work area in basement, [employer] on main level, Able to live on main level with bedroom/bathroom Alternate Level Stairs-Number of Steps: Pt reports multi level split floor plan with 4 levels. ~ 1/2 staircase between levels; able to live on main level Alternate Level Stairs-Rails: Right Bathroom Shower/Tub: Tub/shower unit, Multimedia programmer: Handicapped height Bathroom Accessibility: Yes  Lives With: Significant other IADL History Homemaking Responsibilities: Yes Current License: Yes Mode of Transportation: Musician Occupation: Retired Prior Function Level of Independence: Independent with basic ADLs,  Independent with homemaking with ambulation, Independent with gait  Able to Take Stairs?: Yes Driving: Yes Vocation: Retired Comments: spends a lot of time with grandchilden  Vision Baseline Vision/History: Wears glasses Wears Glasses: Reading only Patient Visual Report: No change from baseline Vision Assessment?: Yes Eye Alignment: Within Functional Limits Ocular Range of Motion: Within Functional Limits Tracking/Visual Pursuits: Able to track stimulus in all quads without difficulty Visual Fields: No apparent deficits Depth Perception: Undershoots Perception  Perception: Within Functional Limits Praxis Praxis: Intact Cognition Overall Cognitive Status: Impaired/Different from baseline Arousal/Alertness: Awake/alert Orientation Level: Person;Place;Other(comment)(Knows she's in hospital, however, unable to explain current medical status) Year: 2019 Month: March Day of Week: Correct Memory: Appears intact Immediate Memory Recall: Sock;Blue;Bed Memory Recall: Sock;Blue;Bed Memory Recall Sock: Without Cue Memory Recall Blue: Without Cue Memory Recall Bed: Without Cue  Attention: Selective Selective Attention: Impaired Selective Attention Impairment: Verbal complex;Functional complex Awareness: Impaired Awareness Impairment: Intellectual impairment;Emergent impairment Executive Function: Self Monitoring;Self Correcting;Decision Making Decision Making: Impaired Decision Making Impairment: Verbal complex;Functional complex Self Monitoring: Impaired Self Monitoring Impairment: Verbal complex;Functional complex Self Correcting: Impaired Self Correcting Impairment: Verbal complex;Functional complex Safety/Judgment: Impaired Comments: decreased awareness of deficits and decreased safety awareness Sensation Sensation Light Touch: Appears Intact Proprioception: Impaired Detail Proprioception Impaired Details: Impaired RUE Coordination Gross Motor Movements are Fluid and  Coordinated: No Fine Motor Movements are Fluid and Coordinated: No Coordination and Movement Description: mild dysmetria in the RLE and RUE  Finger Nose Finger Test: Decreased speed and accuracy in R UE 9 Hole Peg Test: R: 2 min 2 seconds and 1 min 47 seconds  L:32.5 seconds Motor  Motor Motor: Hemiplegia Motor - Skilled Clinical Observations: R hemiplegia.  Mobility     Trunk/Postural Assessment  Cervical Assessment Cervical Assessment: Exceptions to WFL(Forward head) Thoracic Assessment Thoracic Assessment: Exceptions to WFL(Rounded shoulders; kyphotic) Lumbar Assessment Lumbar Assessment: Exceptions to [facility] Postural Control Postural Control: Within Functional Limits  Balance Balance Balance Assessed: Yes Static Sitting Balance Static Sitting - Level of Assistance: 5: Stand by assistance Dynamic Sitting Balance Dynamic Sitting - Balance Support: During functional activity;No upper extremity supported Dynamic Sitting - Level of Assistance: 5: Stand by assistance;4: Min assist Sitting balance - Comments: Sitting to complete bathing/dressing tasks Static Standing Balance Static Standing - Balance Support: During functional activity Static Standing - Level of Assistance: 4: Min assist Static Standing - Comment/# of Minutes: Standing during bathing/dressing tasks Dynamic Standing Balance Dynamic Standing - Balance Support: No upper extremity supported;During functional activity Dynamic Standing - Level of Assistance: 4: Min assist;3: Mod assist Dynamic Standing - Comments: Standing during bathing/dressing tasks Extremity/Trunk Assessment RUE Assessment RUE Assessment: Exceptions to WFL(4/5 proximal-distal) LUE Assessment LUE Assessment: Within Functional Limits   See Function Navigator for Current Functional Status.   Refer to Care Plan for Long Term Goals  Recommendations for other services: Neuropsych and Therapeutic Recreation  Stress management and Outing/community  reintegration   Discharge Criteria: Patient will be discharged from OT if patient refuses treatment 3 consecutive times without medical reason, if treatment goals not met, if there is a change in medical status, if patient makes no progress towards goals or if patient is discharged from hospital.  The above assessment, treatment plan, treatment alternatives and goals were discussed and mutually agreed upon: by patient and by family  Yulianna Folse L 01/07/2018, 3:37 PM

## 2018-01-08 ENCOUNTER — Inpatient Hospital Stay (HOSPITAL_COMMUNITY): Payer: Medicare Other | Admitting: Physical Therapy

## 2018-01-08 ENCOUNTER — Inpatient Hospital Stay (HOSPITAL_COMMUNITY): Payer: Medicare Other

## 2018-01-08 DIAGNOSIS — E46 Unspecified protein-calorie malnutrition: Secondary | ICD-10-CM

## 2018-01-08 DIAGNOSIS — R569 Unspecified convulsions: Secondary | ICD-10-CM

## 2018-01-08 DIAGNOSIS — R799 Abnormal finding of blood chemistry, unspecified: Secondary | ICD-10-CM

## 2018-01-08 DIAGNOSIS — E8809 Other disorders of plasma-protein metabolism, not elsewhere classified: Secondary | ICD-10-CM

## 2018-01-08 MED ORDER — PRO-STAT SUGAR FREE PO LIQD
30.0000 mL | Freq: Two times a day (BID) | ORAL | Status: DC
  Administered 2018-01-08 – 2018-01-11 (×8): 30 mL via ORAL
  Filled 2018-01-08 (×9): qty 30

## 2018-01-08 NOTE — Progress Notes (Signed)
[place] PHYSICAL MEDICINE & REHABILITATION     PROGRESS NOTE    Subjective/Complaints: Patient seen lying in bed this morning. She states she slept well overnight. She notes she had a good first in therapies yesterday.  ROS: Denies CP, SOB, nausea, vomiting, diarrhea.  Objective: Vital Signs: Blood pressure (!) (P) 184/84, pulse (!) (P) 48, temperature (P) 98 F (36.7 C), temperature source (P) Oral, resp. rate (P) 18, height 5\' 6"  (1.676 m), weight 96.6 kg (212 lb 15.4 oz), SpO2 (P) 98 %. No results found. Recent Labs    01/07/18 0539  WBC 9.8  HGB 12.8  HCT 39.3  PLT 330   Recent Labs    01/06/18 0254 01/07/18 0539  NA 138 139  K 3.9 4.2  CL 104 103  GLUCOSE 152* 141*  BUN 18 21*  CREATININE 0.53 0.64  CALCIUM 8.4* 8.4*   CBG (last 3)  Recent Labs    01/05/18 2119 01/06/18 0603 01/06/18 1137  GLUCAP 120* 139* 128*    Wt Readings from Last 3 Encounters:  01/06/18 96.6 kg (212 lb 15.4 oz)  01/05/18 99.5 kg (219 lb 5.7 oz)    Physical Exam:  General:. NAD. Vital signs reviewed. HENT:Crani-site with dressing Eyes:left orbital bruising/swelling Cardiovascular:Normal rate,regular rhythmand No JVD. Respiratory:Effort normaland breath sounds normal.  GI:BS +, non-distended  Neurological: She isalert. Expressive language deficits Motor: RUE 4+/5 prox to distal.  LUE 5/5. RLE: 4+/5.  LLE 5/5. No sensory deficits Psych: pleasant and cooperative. A little anxious  Assessment/Plan: 1. 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 Physiatrist is providing close team supervision and 24 hour management of active medical problems listed below. Physiatrist and rehab team continue to assess barriers to discharge/monitor patient progress toward functional and medical goals.  Function:  Bathing Bathing position   Position:  Shower  Bathing parts Body parts bathed by patient: Right arm, Left lower leg, Left arm, Chest, Abdomen, Front perineal area, Buttocks, Right upper leg, Left upper leg Body parts bathed by helper: Right lower leg, Back  Bathing assist Assist Level: Touching or steadying assistance(Pt > 75%)      Upper Body Dressing/Undressing Upper body dressing   What is the patient wearing?: Pull over shirt/dress, Bra Bra - Perfomed by patient: Thread/unthread right bra strap, Thread/unthread left bra strap Bra - Perfomed by helper: Hook/unhook bra (pull down sports bra) Pull over shirt/dress - Perfomed by patient: Thread/unthread right sleeve, Thread/unthread left sleeve, Put head through opening, Pull shirt over trunk          Upper body assist Assist Level: Touching or steadying assistance(Pt > 75%)      Lower Body Dressing/Undressing Lower body dressing   What is the patient wearing?: Underwear, Pants, Non-skid slipper socks Underwear - Performed by patient: Thread/unthread right underwear leg, Thread/unthread left underwear leg, Pull underwear up/down   Pants- Performed by patient: Thread/unthread right pants leg, Thread/unthread left pants leg, Pull pants up/down   Non-skid slipper socks- Performed by patient: Don/doff right sock, Don/doff left sock                    Lower body assist Assist for lower body dressing: Touching or steadying assistance (Pt > 75%)      Toileting Toileting Toileting activity did not occur: No continent bowel/bladder event Toileting steps completed by patient: Adjust clothing prior to toileting, Performs perineal hygiene, Adjust  clothing after toileting   Toileting Assistive Devices: Grab bar or rail  Toileting assist Assist level: Supervision or verbal cues   Transfers Chair/bed transfer   Chair/bed transfer method: Ambulatory Chair/bed transfer assist level: Supervision or verbal cues       Locomotion Ambulation     Max distance: 166ft   Assist level: Touching or steadying assistance (Pt > 75%)   Wheelchair Wheelchair activity did not occur: N/A        Cognition Comprehension Comprehension assist level: Follows basic conversation/direction with extra time/assistive device  Expression Expression assist level: Expresses basic 90% of the time/requires cueing < 10% of the time., Expresses basic needs/ideas: With extra time/assistive device  Social Interaction Social Interaction assist level: Interacts appropriately 90% of the time - Needs monitoring or encouragement for participation or interaction.  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 Formal pathology report pending. Cont CIR   Notes reviewed, images reviewed, labs reviewed 2. DVT Prophylaxis/Anticoagulation: SCDs. ambulating 3. Pain Management:Hydrocodone as needed for severe pain 4. Mood:Provide emotional support, positive reinforcement 5. Neuropsych: This patientiscapable of making decisions on herown behalf. 6. Skin/Wound Care:remove crani dressing, apply foam or dry dressing as appropriate 7. Fluids/Electrolytes/Nutrition:      -BUN sl elevated   Encourage PO fluids   -recheck bmet on Monday  8.Seizure disorder. Keppra 500 mg twice a day. EEG negative 9.Tobacco abuse. Counseling as appropriate 10.Constipation. Laxative assistance 11. HTN   Labile with hypertensive crisis this morning 12. Hypoalbuminemia   Supplement initiated on 3/9  LOS (Days) 2 A FACE TO FACE EVALUATION WAS PERFORMED  Rylon Poitra Lorie Phenix, MD 01/08/2018 9:35 AM

## 2018-01-08 NOTE — Progress Notes (Signed)
Occupational Therapy Session Note  Patient Details  Name: Tara [name] MRN: [id number] Date of Birth: 21-Apr-1951  Today's Date: 01/08/2018 OT Individual Time: 6433-2951 OT Individual Time Calculation (min): 45 min    Short Term Goals: Week 1:  OT Short Term Goal 1 (Week 1): STG=LTG due to LOS  Skilled Therapeutic Interventions/Progress Updates:    1;1. Pt completes bathing and dressing at shower level with ambulatory transfers at min guard level with Vc to not use walls to steady self. Forced use of RUE with all bathing tasks during showering at sit to stand level. Pt dresses sit to stand from TTB with min guard for standing balance from [facility] in bathroom with VC for hooking bra in front and spinning around to back. Pt stands to brush/detangle hair with min guard. Pt able to pump detangle spray with RUE and use RUE to brush all of head with intermittent touching HOH A to reach back of head with brush and VC for long strokes. Exited session with pt seated in w/c with family present and call light in reach  Therapy Documentation Precautions:  Precautions Precautions: Fall Precaution Comments: Mild right inattention Restrictions Weight Bearing Restrictions: No  See Function Navigator for Current Functional Status.   Therapy/Group: Individual Therapy  Tonny Branch 01/08/2018, 9:57 AM

## 2018-01-08 NOTE — Progress Notes (Signed)
Physical Therapy Session Note  Patient Details  Name: Tara [name] MRN: [id number] Date of Birth: 07/27/1951  Today's Date: 01/08/2018 PT Individual Time: 1005-1105  1615-1700 PT Individual Time Calculation (min): 60 min 45 min   Short Term Goals: Week 1:  PT Short Term Goal 1 (Week 1): STG=LTG due to ELOS   Skilled Therapeutic Interventions/Progress Updates:  Session 1  Pt received sitting in WC and agreeable to PT  PT instructed pt in gait training without AD to day room x 150ft and to rehab gym x 120ft with supervision assist. Min cues initially to improve step height on the RLE to prevent foot drag. .   Nustep reciprocal movement training and forced attention to the RUE. Min cues throughout to prevent RUE from slipping of hand grip as well as proper hip alignment in the RLE.    PT instructed DGI 15/24(<19 indicates increased fall risk; see below for results.)  4 square balance activity: Forward/backward x 12.  R/L x 12 each.  Box step x 10 each direction.  Min assist throughout with cues for improved posture, increased step length bilaterally and improved step height on the R. Pt noted to have improved use of ankle strategy to prevent near LOB with increased trials.   Gait training through hospital to East wing 3x 300ft supervision assist overall from PT with multiple rest breaks due to fatigue. Min cues for safety awareness of obstacles on the L.   Patient returned to room and left sitting in WC with call bell in reach and all needs met.    Session 2.    Pt received supine in bed and agreeable to PT. Supine>sit transfer with Supervision assist and min cues   Gait training through hall with supervision assist from PT 200ft, 150ft, and 180ft.   PT instructed pt in Dynavision balance and force use of RUE tasks. Level surface program A x 3, ~1.7 sec reaction time. Program A on airex pad x3 Average of ~1.9 sec. Progarm A with T scope and standing on airex pad x 3 ~1.9 sec  and 75-90% accuracy with words. supervision assist from PT with cues for attention to the R.   Dynamic standing balance and Rocker board, AP and LR 2 x 2 min each Bil UP progressing to no UE in each direction for up to 30 sec. Min-supervision assist from PT with min cues for improved use of ankle strategy with posterior and L weight shifting.   Patient returned to room and left sitting in WC with call bell in reach and all needs met.           Therapy Documentation Precautions:  Precautions Precautions: Fall Precaution Comments: Mild right inattention Restrictions Weight Bearing Restrictions: No   Pain: Denies.     Balance: Standardized Balance Assessment Standardized Balance Assessment: Dynamic Gait Index Dynamic Gait Index Level Surface: Mild Impairment Change in Gait Speed: Mild Impairment Gait with Horizontal Head Turns: Mild Impairment Gait with Vertical Head Turns: Mild Impairment Gait and Pivot Turn: Mild Impairment Step Over Obstacle: Moderate Impairment Step Around Obstacles: Mild Impairment Steps: Mild Impairment Total Score: 15   See Function Navigator for Current Functional Status.   Therapy/Group: Individual Therapy   E  01/08/2018, 12:25 PM  

## 2018-01-08 NOTE — Progress Notes (Addendum)
Occupational Therapy Session Note  Patient Details  Name: Tara [name] MRN: [id number] Date of Birth: 1951-09-29   1400-1500 60 min  Short Term Goals: Week 1:  OT Short Term Goal 1 (Week 1): STG=LTG due to LOS  Skilled Therapeutic Interventions/Progress Updates:    1:1. No c/o pain. Pt requesting to go to bathroom and then work on [place]. Pt completes all ambulation with CGA and Vc for not reaching for walls. Pt completes toileting B&B in toilet with increased time and supervision for hygiene and CGA for clothing management. In dayroom, pt completes palm<>finger translation of checkers and coins in RUE sorting and stacking coins with VC for organization of coins highest to lowest value. Pt unable to recall how many year she had been married and OT facilitates discussion and written time line to answer question with mod cueing for problem solving setting up subtraction problem and cueing to write larger/press harder onto paper with pen. Pt learns new board game rules, scoring and plays game with mod question cueing for rule following and forced use of RUE to improve cognition and RUE manipulation skills. Exited session with pt seated in w/c call light in reach and all needs met.   Therapy Documentation Precautions:  Precautions Precautions: Fall Precaution Comments: Mild right inattention Restrictions Weight Bearing Restrictions: No General:    See Function Navigator for Current Functional Status.   Therapy/Group: Individual Therapy  Tonny Branch 01/08/2018, 4:57 PM

## 2018-01-09 ENCOUNTER — Inpatient Hospital Stay (HOSPITAL_COMMUNITY): Payer: Medicare Other

## 2018-01-09 DIAGNOSIS — I10 Essential (primary) hypertension: Secondary | ICD-10-CM

## 2018-01-09 MED ORDER — LISINOPRIL 2.5 MG PO TABS
2.5000 mg | ORAL_TABLET | Freq: Every day | ORAL | Status: DC
  Administered 2018-01-09 – 2018-01-11 (×3): 2.5 mg via ORAL
  Filled 2018-01-09 (×3): qty 1

## 2018-01-09 NOTE — Progress Notes (Signed)
[place] PHYSICAL MEDICINE & REHABILITATION     PROGRESS NOTE    Subjective/Complaints: Patient seen lying in bed this morning. He states he slept well overnight. He states he has to use a restroom this morning.  ROS: Denies CP, SOB, nausea, vomiting, diarrhea.  Objective: Vital Signs: Blood pressure (!) 177/101, pulse 69, temperature 99.3 F (37.4 C), temperature source Oral, resp. rate 18, height 5\' 6"  (1.676 m), weight 98.9 kg (218 lb 0.6 oz), SpO2 97 %. No results found. Recent Labs    01/07/18 0539  WBC 9.8  HGB 12.8  HCT 39.3  PLT 330   Recent Labs    01/07/18 0539  NA 139  K 4.2  CL 103  GLUCOSE 141*  BUN 21*  CREATININE 0.64  CALCIUM 8.4*   CBG (last 3)  Recent Labs    01/06/18 1137  GLUCAP 128*    Wt Readings from Last 3 Encounters:  01/09/18 98.9 kg (218 lb 0.6 oz)  01/05/18 99.5 kg (219 lb 5.7 oz)    Physical Exam:  General:. NAD. Vital signs reviewed. HENT:Crani-site with dressing Eyes:left orbital bruising/swelling Cardiovascular:RRRand No JVD. Respiratory:Effort normal and breath sounds normal.  GI:BS +, non-distended  Neurological: She isalert. Expressive language deficits Motor: RUE 4+/5 prox to distal.  LUE 5/5.  RLE: 4/5 hip flexion, knee extension, 4+/5 ADF.  LLE 5/5. No sensory deficits Psych: pleasant and cooperative. A little anxious  Assessment/Plan: 1. 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 Physiatrist is providing close team supervision and 24 hour management of active medical problems listed below. Physiatrist and rehab team continue to assess barriers to discharge/monitor patient progress toward functional and medical goals.  Function:  Bathing Bathing position   Position: Shower  Bathing parts Body parts bathed by patient: Right arm, Left lower leg, Left arm, Chest, Abdomen,  Front perineal area, Buttocks, Right upper leg, Left upper leg Body parts bathed by helper: Right lower leg, Back  Bathing assist Assist Level: Touching or steadying assistance(Pt > 75%)      Upper Body Dressing/Undressing Upper body dressing   What is the patient wearing?: Pull over shirt/dress, Bra Bra - Perfomed by patient: Thread/unthread right bra strap, Thread/unthread left bra strap Bra - Perfomed by helper: Hook/unhook bra (pull down sports bra) Pull over shirt/dress - Perfomed by patient: Thread/unthread right sleeve, Thread/unthread left sleeve, Put head through opening, Pull shirt over trunk          Upper body assist Assist Level: Touching or steadying assistance(Pt > 75%)      Lower Body Dressing/Undressing Lower body dressing   What is the patient wearing?: Underwear, Pants, Non-skid slipper socks Underwear - Performed by patient: Thread/unthread right underwear leg, Thread/unthread left underwear leg, Pull underwear up/down   Pants- Performed by patient: Thread/unthread right pants leg, Thread/unthread left pants leg, Pull pants up/down   Non-skid slipper socks- Performed by patient: Don/doff right sock, Don/doff left sock                    Lower body assist Assist for lower body dressing: Touching or steadying assistance (Pt > 75%)      Toileting Toileting Toileting activity did not occur: No continent bowel/bladder event Toileting steps completed by patient: Adjust clothing prior to toileting, Performs perineal hygiene, Adjust clothing after toileting   Toileting Assistive Devices: Grab bar or Photographer  level: Supervision or verbal cues   Transfers Chair/bed transfer   Chair/bed transfer method: Ambulatory Chair/bed transfer assist level: Supervision or verbal cues       Locomotion Ambulation     Max distance: 342ft  Assist level: Touching or steadying assistance (Pt > 75%)   Wheelchair Wheelchair activity did not occur:  N/A        Cognition Comprehension Comprehension assist level: Follows basic conversation/direction with extra time/assistive device  Expression Expression assist level: Expresses basic needs/ideas: With extra time/assistive device  Social Interaction Social Interaction assist level: Interacts appropriately 90% of the time - Needs monitoring or encouragement for participation or interaction.  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 Formal pathology report pending. Cont CIR 2. DVT Prophylaxis/Anticoagulation: SCDs. ambulating 3. Pain Management:Hydrocodone as needed for severe pain 4. Mood:Provide emotional support, positive reinforcement 5. Neuropsych: This patientiscapable of making decisions on herown behalf. 6. Skin/Wound Care:remove crani dressing, apply foam or dry dressing as appropriate 7. Fluids/Electrolytes/Nutrition:      -BUN sl elevated   Encourage PO fluids   -recheck bmet on tomorrow 8.Seizure disorder. Keppra 500 mg twice a day. EEG negative 9.Tobacco abuse. Counseling as appropriate 10.Constipation. Laxative assistance 11. HTN   Labile on 3/10   Lisinopril 2.5 mg started on 3/10 12. Hypoalbuminemia   Supplement initiated on 3/9  LOS (Days) 3 A FACE TO FACE EVALUATION WAS PERFORMED  Ankit Lorie Phenix, MD 01/09/2018 7:28 AM

## 2018-01-09 NOTE — Progress Notes (Signed)
Occupational Therapy Session Note  Patient Details  Name: Tara [name] MRN: [id number] Date of Birth: 03/09/1951  Today's Date: 01/09/2018 OT Individual Time: 1000-1100 OT Individual Time Calculation (min): 60 min    Short Term Goals: Week 1:  OT Short Term Goal 1 (Week 1): STG=LTG due to LOS  Skilled Therapeutic Interventions/Progress Updates:    1:1. Pt with no c/o pain. Pt requesting to toilet on own. Educated pt and family on safety plan and needing staff to assist to bathroom. Pt verbalizes understanding and family encourages compliance. Pt completes all ambulation with CGA throughout session gathering all needed clothing items and bathing and dressing at sit to stand level with supervision on TTB and BSC in bathroom. Pt educated safety risk of attempting to doff clothing over feet in standing d/t balance deficits. Needs more reinforcement despite verbalizing understanding. Pt stands at sink to complete grooming with supervision resting hips on sink for balance. Pt ambulates to gift shop with 1 seated rest break and locates 5 items in gift shop with mod VC for organized scanning pattern and use of list to recall needed items. Exited session with pt seated in bed, call light in reach and all needs met.  Therapy Documentation Precautions:  Precautions Precautions: Fall Precaution Comments: Mild right inattention Restrictions Weight Bearing Restrictions: No General:   Vital Signs:  See Function Navigator for Current Functional Status.   Therapy/Group: Individual Therapy  Tonny Branch 01/09/2018, 12:26 PM

## 2018-01-10 ENCOUNTER — Inpatient Hospital Stay (HOSPITAL_COMMUNITY): Payer: Medicare Other | Admitting: Physical Therapy

## 2018-01-10 ENCOUNTER — Inpatient Hospital Stay (HOSPITAL_COMMUNITY): Payer: Medicare Other | Admitting: Occupational Therapy

## 2018-01-10 ENCOUNTER — Inpatient Hospital Stay (HOSPITAL_COMMUNITY): Payer: Medicare Other

## 2018-01-10 DIAGNOSIS — I69351 Hemiplegia and hemiparesis following cerebral infarction affecting right dominant side: Principal | ICD-10-CM

## 2018-01-10 DIAGNOSIS — R269 Unspecified abnormalities of gait and mobility: Secondary | ICD-10-CM

## 2018-01-10 DIAGNOSIS — I69398 Other sequelae of cerebral infarction: Secondary | ICD-10-CM

## 2018-01-10 DIAGNOSIS — I69320 Aphasia following cerebral infarction: Secondary | ICD-10-CM

## 2018-01-10 LAB — BASIC METABOLIC PANEL
Anion gap: 9 (ref 5–15)
BUN: 20 mg/dL (ref 6–20)
CO2: 27 mmol/L (ref 22–32)
Calcium: 8.9 mg/dL (ref 8.9–10.3)
Chloride: 101 mmol/L (ref 101–111)
Creatinine, Ser: 0.65 mg/dL (ref 0.44–1.00)
GFR calc Af Amer: >60 mL/min (ref >60)
GFR calc non Af Amer: >60 mL/min (ref >60)
Glucose, Bld: 118 mg/dL — ABNORMAL HIGH (ref 65–99)
Potassium: 4.8 mmol/L (ref 3.5–5.1)
Sodium: 137 mmol/L (ref 135–145)

## 2018-01-10 NOTE — Progress Notes (Signed)
Social Work Patient ID: Tara [name], female   DOB: 1951/03/12, 67 y.o.   MRN: [id number]  Team feels pt will meet goals and be ready for discharge on Wed. Checking with MD to make sure medically stable for DC Wed.

## 2018-01-10 NOTE — Progress Notes (Signed)
Speech Language Pathology Daily Session Note  Patient Details  Name: Tara [name] MRN: [id number] Date of Birth: 02-09-1951  Today's Date: 01/10/2018 SLP Individual Time: 4098-1191 SLP Individual Time Calculation (min): 60 min  Short Term Goals: Week 1: SLP Short Term Goal 1 (Week 1): Given supervision cues, pt will utilize word finding strategies to convey semi-complex information.  SLP Short Term Goal 2 (Week 1): Pt will follow 2 step directions with >90% accuracy and supervision level cues. SLP Short Term Goal 3 (Week 1): Pt will demonstrate selective attention in moderately distracting environment for 45 minutes with supervision level cues.  SLP Short Term Goal 4 (Week 1): Pt will demonstrate intellectual awareness/emergent awareness by listing 3 phsycial and 3 speech-language deficits and their impact on safety within dialy living with supervision cues.  SLP Short Term Goal 5 (Week 1): Pt will self-monitor and self-correct verbal erros with supervision level cues.   Skilled Therapeutic Interventions: Skilled ST services focused on  Speech and family education skills. SLP facilitated word finding skills in various word finding tasks, requiring mod A fading to min A semantic cues in basic divergent naming tasks, supervision A verbal cues in convergent naming tasks, min A semantic cues in communication barrier tasks and supervision A verbal/question cues in conversation. Pt demonstrated self- monitoring and correction of verbal errors with supervision A verbal/question cues. SLP facilitated identification of physical and cognitive deficits with Min A verbal cues, demonstrating increased safety awareness . SLP reviewed cueing strategies with family members present, son and daughter-in-law, including sentence completion, semantic cues and personal experiences/refeienceing to increase verbal associations and verbal correction to reexamine verbal errors made by pt, family stated understanding. Pt  was left in room with call bell within reach. Recommend to continue skilled ST services.       Function:  Eating Eating                 Cognition Comprehension Comprehension assist level: Understands complex 90% of the time/cues 10% of the time  Expression   Expression assist level: Expresses basic needs/ideas: With extra time/assistive device  Social Interaction Social Interaction assist level: Interacts appropriately 90% of the time - Needs monitoring or encouragement for participation or interaction.  Problem Solving Problem solving assist level: Solves basic 90% of the time/requires cueing < 10% of the time  Memory Memory assist level: Recognizes or recalls 90% of the time/requires cueing < 10% of the time    Pain Pain Assessment Pain Assessment: No/denies pain Pain Score: 0-No pain  Therapy/Group: Individual Therapy  [name]  [facility] 01/10/2018, 12:55 PM

## 2018-01-10 NOTE — Progress Notes (Signed)
Physical Therapy Session Note  Patient Details  Name: Tara [name] MRN: [id number] Date of Birth: 07-Jan-1951  Today's Date: 01/10/2018 PT Individual Time: 1415-1530 PT Individual Time Calculation (min): 75 min   Short Term Goals: Week 1:  PT Short Term Goal 1 (Week 1): STG=LTG due to ELOS   Skilled Therapeutic Interventions/Progress Updates: Pt presented in bed with family present agreeable to therapy. Pt performed bed mobility with HOB elevated and supervision. Pt requesting to use bathroom, ambulated to toilet (+void) with supervision. Pt ambulated throughout hospital to front entrance no AD with intermittent cues for r obstacle negotiation. Pt required x 2 standing rests and x 1 seated rest due to fatigue. Pt ambulated 200t x 2 outside in courtyard with min cues for safety. Pt ambulated back to rehab unit with x 1 seated rest and performed standing balance activities including toe taps and alternating steps on 6in step. Pt with increased difficulty clearing R foot on step however was able to improve with verbal cues. Performed standing reach and placing clothes pins on basketball net while standing on Airex. Performed NuStep L2 x 6:30 for endurance and strengthening. Pt ambulated back to room and returned to bed. Pt left in bed with bed alarm on and current needs met.      Therapy Documentation Precautions:  Precautions Precautions: Fall Precaution Comments: Mild right inattention Restrictions Weight Bearing Restrictions: No General:   Vital Signs: Therapy Vitals Temp: (!) 97.4 F (36.3 C) Temp Source: Oral Pulse Rate: 68 Resp: 14 BP: 105/65 Patient Position (if appropriate): Lying Oxygen Therapy SpO2: 97 % O2 Device: Room Air Pain: Pain Assessment Pain Assessment: No/denies pain Critical Care Pain Observation Tool (CPOT) Facial Expression: Relaxed, neutral   See Function Navigator for Current Functional Status.   Therapy/Group: Individual Therapy  Hebe Merriwether  Coulter Oldaker, PTA  01/10/2018, 3:38 PM

## 2018-01-10 NOTE — Progress Notes (Addendum)
Occupational Therapy Session Note  Patient Details  Name: Tara [name] MRN: [id number] Date of Birth: 1951-06-07  Today's Date: 01/10/2018 OT Individual Time: 4098-1191 OT Individual Time Calculation (min): 60 min    Short Term Goals: Week 1:  OT Short Term Goal 1 (Week 1): STG=LTG due to LOS  Skilled Therapeutic Interventions/Progress Updates:    Pt had no c/o pain. Pt presented in bed, and agreed to take a shower, she was able to locate and gather clothing as well as towels for shower. She requested to shave her armpits, a razor was provided. ADL bathing session in shower on shower bench w/o AD, Supervision with VC's for safety due to fast paced movements especially when shaving, pt was given education to take rest breaks and slowing down to reduce fatigue and increase safety. She ambulated from room to therapy gym w/o AD, she participated in PNF patterns (D1 and D2) using cups seated EOM to imporve ROM, endurance, weight shifting, and dynamic sitting balance as well as grip in BUE. She then participated in UE exercise using 3# weighted bar, 10 reps 3x's of Shoulder raises, Chest press, tricep curls and bicep curls, she required rest breaks between exercises, overall supervision for therapy session . Pt ambulated back to room left in bed with bed alarm and call bell in reach.   Therapy Documentation Precautions:  Precautions Precautions: Fall Precaution Comments: Mild right inattention Restrictions Weight Bearing Restrictions: No  See Function Navigator for Current Functional Status.   Therapy/Group: Individual Therapy  Vilinda Blanks 01/10/2018, 9:50 AM

## 2018-01-10 NOTE — Progress Notes (Signed)
[facility] PHYSICAL MEDICINE & REHABILITATION     PROGRESS NOTE    Subjective/Complaints: No issues overnite.  Denies pain.  Reviewed labs  ROS: Denies CP, SOB, nausea, vomiting, diarrhea.  Objective: Vital Signs: Blood pressure (!) 189/85, pulse 62, temperature 97.8 F (36.6 C), temperature source Oral, resp. rate 18, height 5\' 6"  (1.676 m), weight 98.9 kg (218 lb 0.6 oz), SpO2 98 %. No results found. No results for input(s): WBC, HGB, HCT, PLT in the last 72 hours. Recent Labs    01/10/18 0738  NA 137  K 4.8  CL 101  GLUCOSE 118*  BUN 20  CREATININE 0.65  CALCIUM 8.9   CBG (last 3)  No results for input(s): GLUCAP in the last 72 hours.  Wt Readings from Last 3 Encounters:  01/09/18 98.9 kg (218 lb 0.6 oz)  01/05/18 99.5 kg (219 lb 5.7 oz)    Physical Exam:  General:. NAD. Vital signs reviewed. HENT:Crani-site with dressing Eyes:left orbital bruising/swelling Cardiovascular:RRRand No JVD. Respiratory:Effort normal and breath sounds normal.  GI:BS +, non-distended  Neurological: She isalert. Expressive language deficits Motor: RUE 4+/5 prox to distal.  LUE 5/5.  RLE: 4/5 hip flexion, knee extension, 4+/5 ADF.  LLE 5/5. No sensory deficits Psych: pleasant and cooperative. A little anxious  Assessment/Plan: 1. 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 Physiatrist is providing close team supervision and 24 hour management of active medical problems listed below. Physiatrist and rehab team continue to assess barriers to discharge/monitor patient progress toward functional and medical goals.  Function:  Bathing Bathing position   Position: Shower  Bathing parts Body parts bathed by patient: Right arm, Left arm, Chest, Abdomen, Front perineal area, Buttocks, Right upper leg, Left upper leg, Right lower leg, Left lower leg,  Back Body parts bathed by helper: Right lower leg, Back  Bathing assist Assist Level: Supervision or verbal cues      Upper Body Dressing/Undressing Upper body dressing   What is the patient wearing?: Pull over shirt/dress, Bra Bra - Perfomed by patient: Thread/unthread right bra strap, Thread/unthread left bra strap, Hook/unhook bra (pull down sports bra) Bra - Perfomed by helper: Hook/unhook bra (pull down sports bra) Pull over shirt/dress - Perfomed by patient: Thread/unthread right sleeve, Thread/unthread left sleeve, Put head through opening, Pull shirt over trunk          Upper body assist Assist Level: Supervision or verbal cues      Lower Body Dressing/Undressing Lower body dressing   What is the patient wearing?: Underwear, Pants, Non-skid slipper socks Underwear - Performed by patient: Thread/unthread right underwear leg, Thread/unthread left underwear leg, Pull underwear up/down   Pants- Performed by patient: Thread/unthread right pants leg, Thread/unthread left pants leg, Pull pants up/down   Non-skid slipper socks- Performed by patient: Don/doff right sock, Don/doff left sock                    Lower body assist Assist for lower body dressing: Supervision or verbal cues      Toileting Toileting Toileting activity did not occur: No continent bowel/bladder event Toileting steps completed by patient: Adjust clothing prior to toileting, Performs perineal hygiene, Adjust clothing after toileting   Toileting Assistive Devices: Grab bar or rail  Toileting assist Assist level: Supervision or verbal cues   Transfers Chair/bed transfer   Chair/bed transfer method: Ambulatory Chair/bed transfer assist level: Supervision  or verbal cues       Locomotion Ambulation     Max distance: 322ft  Assist level: Touching or steadying assistance (Pt > 75%)   Wheelchair Wheelchair activity did not occur: N/A        Cognition Comprehension Comprehension assist level:  Follows basic conversation/direction with extra time/assistive device  Expression Expression assist level: Expresses basic needs/ideas: With extra time/assistive device  Social Interaction Social Interaction assist level: Interacts appropriately 90% of the time - Needs monitoring or encouragement for participation or interaction.  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 Formal pathology report pending. CIR PT, OT, SLP 2. DVT Prophylaxis/Anticoagulation: SCDs. ambulating 3. Pain Management:no current c/os 4. Mood:Provide emotional support, positive reinforcement 5. Neuropsych: This patientiscapable of making decisions on herown behalf. 6. Skin/Wound Care:remove crani dressing, apply foam or dry dressing as appropriate 7. Fluids/Electrolytes/Nutrition:      -BMET normal 3/11    BMP Latest Ref Rng & Units 01/10/2018 01/07/2018 01/06/2018  Glucose 65 - 99 mg/dL 118(H) 141(H) 152(H)  BUN 6 - 20 mg/dL 20 21(H) 18  Creatinine 0.44 - 1.00 mg/dL 0.65 0.64 0.53  Sodium 135 - 145 mmol/L 137 139 138  Potassium 3.5 - 5.1 mmol/L 4.8 4.2 3.9  Chloride 101 - 111 mmol/L 101 103 104  CO2 22 - 32 mmol/L 27 26 26   Calcium 8.9 - 10.3 mg/dL 8.9 8.4(L) 8.4(L)   8.Seizure disorder. Keppra 500 mg twice a day. EEG negative 9.Tobacco abuse. Counseling as appropriate 10.Constipation. Laxative assistance 11. HTN      Lisinopril 2.5 mg started on 3/10, may need further adjustments  Vitals:   01/09/18 2119 01/10/18 0115  BP: (!) 172/91 (!) 189/85  Pulse: 70 62  Resp:  18  Temp:  97.8 F (36.6 C)  SpO2:  98%   12. Hypoalbuminemia   Supplement initiated on  3/9  LOS (Days) 4 A FACE TO FACE EVALUATION WAS PERFORMED  Charlett Blake, MD 01/10/2018 12:35 PM

## 2018-01-11 ENCOUNTER — Inpatient Hospital Stay (HOSPITAL_COMMUNITY): Payer: Medicare Other | Admitting: Physical Therapy

## 2018-01-11 ENCOUNTER — Inpatient Hospital Stay (HOSPITAL_COMMUNITY): Payer: Medicare Other

## 2018-01-11 ENCOUNTER — Inpatient Hospital Stay (HOSPITAL_COMMUNITY): Payer: Medicare Other | Admitting: Occupational Therapy

## 2018-01-11 NOTE — Progress Notes (Signed)
Physical Therapy Session Note  Patient Details  Name: Tara [name] MRN: [id number] Date of Birth: 02-23-1951  Today's Date: 01/11/2018 PT Individual Time: 1115-1200 PT Individual Time Calculation (min): 45 min   Short Term Goals: Week 1:  PT Short Term Goal 1 (Week 1): STG=LTG due to ELOS   Skilled Therapeutic Interventions/Progress Updates: Pt presented in bed with son present agreeable to therapy. Pt ambulated out of room and around unit at mod I level with son present. Pt participated in functional activities including car transfer, bed mobility, stairs, all performed at supervision or mod I level (see function tab). Pt instructed in and provided with [facility] A exercises and was able to perform with supervision and min cues for technique. Pt ambulated to day room and participated in NuStep L 3 x 6 min for endurance. Pt returned to room and returned to bed with bed alarm on and needs met.      Therapy Documentation Precautions:  Precautions Precautions: Fall Precaution Comments: Mild right inattention Restrictions Weight Bearing Restrictions: No General:   Vital Signs: Therapy Vitals Temp: 97.9 F (36.6 C) Pulse Rate: 69 Resp: 20 BP: 136/69 Patient Position (if appropriate): Sitting Oxygen Therapy SpO2: 97 % O2 Device: Room Air Pain: Pain Assessment Pain Assessment: No/denies pain Mobility: Bed Mobility Bed Mobility: Rolling Right;Rolling Left;Supine to Sit;Sit to Supine Rolling Right: 6: Modified independent (Device/Increase time) Rolling Left: 6: Modified independent (Device/Increase time) Supine to Sit: 6: Modified independent (Device/Increase time) Sit to Supine: 6: Modified independent (Device/Increase time) Transfers Sit to Stand: 6: Modified independent (Device/Increase time) Stand Pivot Transfers: 6: Modified independent (Device/Increase time) Locomotion : Ambulation Ambulation/Gait Assistance: 6: Modified independent (Device/Increase time) Ambulation  Distance (Feet): 200 Feet Assistive device: None Gait Gait: Yes Gait Pattern: Impaired Gait Pattern: Poor foot clearance - right;Decreased stride length;Decreased step length - right Stairs / Additional Locomotion Stairs: Yes Stairs Assistance: 5: Supervision Stair Management Technique: One rail Left Number of Stairs: 12 Height of Stairs: 6 Wheelchair Mobility Wheelchair Mobility: No  Trunk/Postural Assessment : Cervical Assessment Cervical Assessment: Within Functional Limits Thoracic Assessment Thoracic Assessment: Within Functional Limits Lumbar Assessment Lumbar Assessment: Within Functional Limits Postural Control Postural Control: Within Functional Limits  Balance: Balance Balance Assessed: Yes Static Sitting Balance Static Sitting - Level of Assistance: 6: Modified independent (Device/Increase time) Dynamic Sitting Balance Dynamic Sitting - Balance Support: During functional activity Dynamic Sitting - Level of Assistance: 6: Modified independent (Device/Increase time) Static Standing Balance Static Standing - Balance Support: No upper extremity supported Dynamic Standing Balance Dynamic Standing - Balance Support: No upper extremity supported Dynamic Standing - Level of Assistance: 6: Modified independent (Device/Increase time);5: Stand by assistance   See Function Navigator for Current Functional Status.   Therapy/Group: Individual Therapy  Kaiyon Hynes  Sajan Cheatwood, PTA  01/11/2018, 4:15 PM

## 2018-01-11 NOTE — Progress Notes (Signed)
Speech Language Pathology Discharge Summary  Patient Details  Name: Tara [name] MRN: [id number] Date of Birth: [birth year]  Today's Date: 01/11/2018 SLP Individual Time: 1300-1400 SLP Individual Time Calculation (min): 60 min   Skilled Therapeutic Interventions: Skilled ST services focused on cognitive skills and family education. SLP facilitated cognitive assessment to highlight current deficits and to increase intellectual awareness, administered MOCA version 7.1 scoring 13 out 30 (n=. 26) with deficits in problem solving, naming (language of confusion), word finding, immediate/delayed recall, and attention. SLP reviwed results with pt and son,pt agreed with current impairments and confirmed that 24/hour supervision care will be provided. Pt required Mod A verbal cues to redirect due to external and internal distractions at selective attention level. SLP explained the process of brain injury healing and the main aspect to focus on is safety while the brain continues to heal, pt agreed. Pt was left in room with son and call bell within reach.    Patient has met 2 of 4 long term goals.  Patient to discharge at overall Supervision;Min;Mod level.  Reasons goals not met:     Clinical Impression/Discharge Summary:    Pt demonstrated great progress meeting - out - goals,discharging at supervision level for communication,comprehension, Min A for intellectual/ emergent awareness and Mod A for selective attention with external and internal distractions. Pt continues to demonstrate impairment in intellectual/emergent awareness/it's impacting on current safety awareness, complex comprehension, selective attention and recall novel information. Family has been present for all sessions and is aware of 24/hour supervision need. Family is able to provide cognitive and physical assistance. Pt would benefit from skilled ST services in order to maximize functional independence and reduce burden of care prior to  discharge in outpatient setting.  Care Partner:  Caregiver Able to Provide Assistance: Yes  Type of Caregiver Assistance: Physical;Cognitive  Recommendation:  Outpatient SLP;24 hour supervision/assistance  Rationale for SLP Follow Up: Maximize cognitive function and independence;Reduce caregiver burden   Equipment:   N/A  Reasons for discharge: Discharged from hospital   Patient/Family Agrees with Progress Made and Goals Achieved: Yes   Function:  Eating Eating                 Cognition Comprehension Comprehension assist level: Follows basic conversation/direction with no assist  Expression   Expression assist level: Expresses basic needs/ideas: With no assist;Expresses complex 90% of the time/cues < 10% of the time  Social Interaction Social Interaction assist level: Interacts appropriately 90% of the time - Needs monitoring or encouragement for participation or interaction.  Problem Solving Problem solving assist level: Solves basic 90% of the time/requires cueing < 10% of the time  Memory Memory assist level: Recognizes or recalls 75 - 89% of the time/requires cueing 10 - 24% of the time   Tara [name]  [facility] 01/11/2018, 3:41 PM

## 2018-01-11 NOTE — Progress Notes (Signed)
Occupational Therapy Discharge Summary  Patient Details  Name: Tara [name] MRN: [id number] Date of Birth: 08-13-1951   Patient has met 4 of 82 long term goals due to improved activity tolerance, improved balance, postural control, functional use of  RIGHT upper extremity, improved attention, improved awareness and improved coordination.  Patient to discharge at overall Supervision level.  Patient's care partner is independent to provide the necessary physical and cognitive assistance at discharge.  Patient's family has been present throughout rehab admission and are aware of pt's physical and cognitive deficits. They voice willing and ableness to provide needed supervision assist at d/c, recommending 24 hour supervision at d/c.    Recommendation:  Patient will benefit from ongoing skilled OT services in outpatient setting to continue to advance functional skills in the area of BADL, iADL and Reduce care partner burden.  Equipment: Pt's family reports they have private purchased shower chair  Reasons for discharge: treatment goals met and discharge from hospital  Patient/family agrees with progress made and goals achieved: Yes  OT Discharge Precautions/Restrictions  Precautions Precautions: Fall Precaution Comments: Mild right inattention Restrictions Weight Bearing Restrictions: No Vision Baseline Vision/History: Wears glasses Wears Glasses: Reading only Patient Visual Report: No change from baseline Vision Assessment?: No apparent visual deficits Eye Alignment: Within Functional Limits Ocular Range of Motion: Within Functional Limits Tracking/Visual Pursuits: Able to track stimulus in all quads without difficulty Visual Fields: No apparent deficits Perception  Perception: Within Functional Limits Cognition Orientation Level: Oriented X4 Sensation Sensation Light Touch: Appears Intact Proprioception: Impaired Detail Proprioception Impaired Details: Impaired  RUE Coordination Gross Motor Movements are Fluid and Coordinated: Yes Fine Motor Movements are Fluid and Coordinated: Yes 9 Hole Peg Test: R: 42.55sec and 35.88 sec    L: 30.05 and 25.26 Motor  Motor Motor: Hemiplegia Motor - Discharge Observations: minimal R hemiplegia  Trunk/Postural Assessment  Cervical Assessment Cervical Assessment: Within Functional Limits(Forward head) Thoracic Assessment Thoracic Assessment: Within Functional Limits Lumbar Assessment Lumbar Assessment: Within Functional Limits Postural Control Postural Control: Within Functional Limits  Balance Balance Balance Assessed: Yes Static Sitting Balance Static Sitting - Level of Assistance: 6: Modified independent (Device/Increase time) Dynamic Sitting Balance Dynamic Sitting - Balance Support: During functional activity;No upper extremity supported Dynamic Sitting - Level of Assistance: 6: Modified independent (Device/Increase time);5: Stand by assistance Sitting balance - Comments: Sitting to complete bathing/dressing tasks Static Standing Balance Static Standing - Balance Support: During functional activity;No upper extremity supported Static Standing - Level of Assistance: 5: Stand by assistance;6: Modified independent (Device/Increase time) Dynamic Standing Balance Dynamic Standing - Balance Support: No upper extremity supported;During functional activity Dynamic Standing - Level of Assistance: 6: Modified independent (Device/Increase time);5: Stand by assistance Dynamic Standing - Comments: Standing to complete bathing/dressing tasks Extremity/Trunk Assessment RUE Assessment RUE Assessment: Within Functional Limits(4+/5) LUE Assessment LUE Assessment: Within Functional Limits   See Function Navigator for Current Functional Status.  Octavion Mollenkopf L 01/11/2018, 9:35 AM

## 2018-01-11 NOTE — Discharge Instructions (Signed)
Inpatient Rehab Discharge Instructions  Tara [name] Discharge date and time: No discharge date for patient encounter.   Activities/Precautions/ Functional Status: Activity: activity as tolerated Diet: regular diet Wound Care: keep wound clean and dry Functional status:  ___ No restrictions     ___ Walk up steps independently ___ 24/7 supervision/assistance   ___ Walk up steps with assistance ___ Intermittent supervision/assistance  ___ Bathe/dress independently ___ Walk with walker     _x__ Bathe/dress with assistance ___ Walk Independently    ___ Shower independently ___ Walk with assistance    ___ Shower with assistance ___ No alcohol     ___ Return to work/school ________  Special Instructions: No driving or smoking   COMMUNITY REFERRALS UPON DISCHARGE:    Outpatient: PT, OT, SP  Agency:[facility] Phone:[phone]   Date of Last Service:01/12/2018  Appointment Date/Time:MARCH 20 Wednesday 8:30-10:15 AM (OT & PT) MARCH 22 2:00-2:45 (SPT)  Medical Equipment/Items Ordered:NO NEEDS     GENERAL COMMUNITY RESOURCES FOR PATIENT/FAMILY: Support Groups:[facility] SUPPORT GROUP SECOND Tuesday OF EACH MONTH ON THE REHAB UNIT @ 7:00 PM QUESTIONS CONTACT Lennart Pall [phone]   My questions have been answered and I understand these instructions. I will adhere to these goals and the provided educational materials after my discharge from the hospital.  Patient/Caregiver Signature _______________________________ Date __________  Clinician Signature _______________________________________ Date __________  Please bring this form and your medication list with you to all your follow-up doctor's appointments.

## 2018-01-11 NOTE — Progress Notes (Signed)
Social Work Patient ID: Tara [name], female   DOB: 02/16/1951, 67 y.o.   MRN: [id number]  Pt excited regarding being able to go home tomorrow. Son is here for education today and will be providing supervision along with his father-pt's husband. Will need to set up pt with a PCP. She wants to talk with others and set up one herself. Discussed needing one, pt feel she will be seeing enough MD's for follow up when goes home. Son present and understood the plan. Will give first OP appointment and then once there will have others arranged.

## 2018-01-11 NOTE — Discharge Summary (Signed)
[name], [name]            ACCOUNT NO.:  [id number]  MEDICAL RECORD NO.:  [id number]  LOCATION:  4W21C                        FACILITY:  [facility]  PHYSICIAN:  Tara [name], M.D.DATE OF BIRTH:  1951-03-25  DATE OF ADMISSION:  01/06/2018 DATE OF DISCHARGE:  01/12/2018                              DISCHARGE SUMMARY   DISCHARGE DIAGNOSES: 1. Left frontal brain mass, status post craniotomy, with left corona     radiata infarction. 2. Sequential compression devices for deep venous thrombosis     prophylaxis. 3. Pain management. 4. Seizure disorder. 5. Tobacco abuse. 6. Constipation. 7. Hypertension.  HISTORY OF PRESENT ILLNESS:  This is a 67 year old right-handed female with a history of tobacco abuse and no prescription medications, who lives with spouse, independent prior to admission.  She presented on December 31, 2017, with confusion, slurred speech, left frontal headache, and right-sided weakness.  CT MRI demonstrated an enhancing lesion surrounding T2 signal, consistent with a likely mass.  CT of the chest with no finding suspicious for malignancy.  CT abdomen and pelvis is unremarkable.  Noted seizure on January 02, 2018; loaded with Keppra; EEG with no seizure activity.  Followup MRI, January 03, 2018, showed a new area of acute infarction in the left corona radiata and external capsule; a large area of edema, left cerebral white matter, similar to recent MRI; and a 4 mm shift to the right, unchanged.  She underwent a left craniotomy as well as biopsy for a brain mass on January 04, 2018, per Dr. Vertell Limber.  Medical Oncology consulted.  Preliminary frozen pathology report was nondiagnostic, awaiting the formal pathology report.  Patient followup outpatient.  Decadron therapy as indicated.  The patient was admitted for a comprehensive rehab program.  PAST MEDICAL HISTORY:  See discharge diagnoses.  SOCIAL HISTORY:  She lives with spouse, independent prior to  admission. Functional status upon admission to [facility] was minimal assist, 120 feet, no assistive device; minimal assist, sit to stand; and min-mod assist, activities of daily living.  PHYSICAL EXAMINATION:  VITAL SIGNS:  Blood pressure 152/78, pulse 63, temperature 97, and respirations 16. GENERAL:  An alert female, expressively aphasic, occasional processing delays, apraxia. EYES:  EOMs are intact. HEAD:  Craniotomy site is clean and dry. CARDIAC:  Rate controlled. ABDOMEN:  Soft and nontender.  Good bowel sounds. LUNGS:  Clear to auscultation without wheeze.  REHABILITATION HOSPITAL COURSE:  The patient was admitted to Inpatient Rehab Services.  Therapies were initiated on a 3-hour daily basis, consisting of physical therapy, occupational therapy, speech therapy, and rehabilitation nursing.  The following issues were addressed during the patient's rehabilitation stay.  Pertaining to [name] left frontal brain mass, craniotomy on January 04, 2018.  Formal pathology report is pending.  She would follow up Outpatient Medical Oncology as well as Neurosurgery.  SCDs for DVT prophylaxis.  She was ambulatory. Blood pressure was controlled and monitored on low-dose lisinopril. Keppra for seizure prophylaxis.  EEG is negative.  Bouts of constipation, resolved with laxative assistance.  The patient received weekly collaborative interdisciplinary team conferences to discuss estimated length of stay, family teaching, and any barriers to her discharge.  She was ambulating 200 feet x2; performing standing  balance activities, including toe taps and alternating steps; and performed standing, reach, and placing clothespins on objects.  She could gather her belongings for activities of daily living and homemaking.  She was able to communicate her needs.  She demonstrated some intellectual awareness and emergent awareness by listing 3 physical and 3 speech language deficits.  She did have  some expressive aphasia.  It was advised the need for supervision for her safety.  Full family teaching was completed.  DISCHARGE MEDICATIONS:  Included: 1. Decadron 4 mg every 12 hours, taper as directed. 2. Colace 100 mg p.o. b.i.d. 3. Keppra 500 mg p.o. b.i.d. 4. Lisinopril 2.5 mg p.o. at bedtime. 5. Protonix 40 mg p.o. daily. 6. Tylenol as needed. 7. She was using hydrocodone on a limited basis for pain.  DIET:  Regular.  FOLLOWUP:  She would follow up with Dr. Alysia Penna at the [facility] as advised; Dr. Erline Levine, Neurosurgery, call for an appointment; and Dr. Cecil Cobbs, Medical Oncology.  SPECIAL INSTRUCTIONS:  No driving.  No smoking.  No alcohol.     Lauraine Rinne, P.A.   ______________________________ Tara [name], M.D.    DA/MEDQ  D:  01/11/2018  T:  01/11/2018  Job:  [id number]  cc:   Marchia Meiers. Vertell Limber, M.D. Dr. Frederik Pear, M.D.

## 2018-01-11 NOTE — Progress Notes (Addendum)
Occupational Therapy Session Note  Patient Details  Name: Tara [name] MRN: [id number] Date of Birth: 02/09/1951  Today's Date: 01/11/2018 OT Individual Time: 0740-0910 OT Total Time: 90 min  Short Term Goals: Week 1:  OT Short Term Goal 1 (Week 1): STG=LTG due to LOS  Skilled Therapeutic Interventions/Progress Updates:  PT presented supine in bed with son Tara [name] present. She agreed to shower and gathered her clean clothing and towels. She completed ADL bathing session Supervision with VC's for safety awareness and energy conservation. She ambulated with out AD to rehab apartment for simulated showering with son present to address shower set up at home. Pt and son was educated on appropriate and safest placement of grab bars for home shower. Pt has decreased awareness of deficients so Therapist went over the H&P as well as imaging in her records to try to answer her questions. Pt was also given information about outpatient therapy and what to expect after discharge. She then participated in 9 hole peg assessment R hand average of 39.21, L hand average of 27.65. Pt then completed TE using 3# weighted bar 5reps 3x's working on Shoulders, Chest, triceps and biceps to improve strength in UB. Pt completed session ambulating back to room and son being checked off to assist the pt to the bathroom. Overall session required Supervision with VC's due to safety/decresed balance.      Therapy Documentation Precautions:  Precautions Precautions: Fall Precaution Comments: Mild right inattention Restrictions Weight Bearing Restrictions: No      Therapy/Group: Individual Therapy  Tara [name] 01/11/2018, 3:14 PM

## 2018-01-11 NOTE — Patient Care Conference (Signed)
Inpatient RehabilitationTeam Conference and Plan of Care Update Date: 01/12/2018   Time: 9:57 AM    Patient Name: Tara [name]      Medical Record Number: [id number]  Date of Birth: 1951/09/29 Sex: Female         Room/Bed: 4W21C/4W21C-01 Payor Info: Payor: MEDICARE / Plan: MEDICARE PART A AND B / Product Type: *No Product type* /    Admitting Diagnosis: Brain Masss CVA  Admit Date/Time:  01/06/2018  3:43 PM Admission Comments: No comment available   Primary Diagnosis:  <principal problem not specified> Principal Problem: <principal problem not specified>  Patient Active Problem List   Diagnosis Date Noted  . Reactive hypertension   . Hypoalbuminemia due to protein-calorie malnutrition ([place])   . Seizures ([place])   . Elevated BUN   . Brain mass 01/06/2018  . Aphasia   . Abnormal CT of the head   . CNS mass 01/01/2018  . Acute metabolic encephalopathy 74/14/2395  . Headache 12/31/2017  . Hypertension 12/31/2017  . Hyperglycemia 12/31/2017  . Confusion 12/31/2017    Expected Discharge Date: Expected Discharge Date: 01/12/18  Team Members Present: Physician leading conference: Dr. Alysia Penna Social Worker Present: Ovidio Kin, LCSW Nurse Present: Rayetta Pigg, RN PT Present: Barrie Folk, PT;Rosita Dechalus, PTA OT Present: Napoleon Form, OT SLP Present: Stormy Fabian, SLP PPS Coordinator present : Daiva Nakayama, RN, CRRN     Current Status/Progress Goal Weekly Team Focus  Medical   Left frontal craniotomy wound CDI, staples removed , ok to shower, vitals are stable  home with outpt Neuro Onc f/u  transition to home environment   Bowel/Bladder        cont B & B     Swallow/Nutrition/ Hydration        Regular diet     ADL's   Supervision-mod I overall  Supervision-mod I overall  d/c planning, family ed, pt to d/c home tomorrow with recommendation for 24 hr supervision 2/2 cognitive deficits.    Mobility   mod I   mod I - supervision  d/c planning, safety  awarenss, endurance, higher level balance   Communication   Supervision A word finding in conversation   Supervision A       Safety/Cognition/ Behavioral Observations  Supervision A comprehenison, Mod A selective attention and Min A awareness   Supervision A       Pain        less than 3-no pain meds needed     Skin        sutures removed yesterday wash soap and water        *See Care Plan and progress notes for long and short-term goals.     Barriers to Discharge  Current Status/Progress Possible Resolutions Date Resolved   Physician    Pending chemo/radiation     progressed well with mobility  D/C home today      Nursing                  PT                    OT                  SLP                SW                Discharge Planning/Teaching Needs:    Home with husband who can flex his  schedule to be there with pt and take her to OP therapies. Family has been here daily for therapies.     Team Discussion:  Pt making daily progress and has met her goals of supervision level. Sutures removed yesterday.  Family has been here for family training and is prepared for discharge. Pt to go to OP Neuro for therapies. Medically stable for DC today.  Revisions to Treatment Plan:  DC 3/13    Continued Need for Acute Rehabilitation Level of Care: The patient requires daily medical management by a physician with specialized training in physical medicine and rehabilitation for the following conditions: Daily direction of a multidisciplinary physical rehabilitation program to ensure safe treatment while eliciting the highest outcome that is of practical value to the patient.: Yes Daily medical management of patient stability for increased activity during participation in an intensive rehabilitation regime.: Yes Daily analysis of laboratory values and/or radiology reports with any subsequent need for medication adjustment of medical intervention for : Neurological problems;Post surgical  problems;Other  [name] 01/12/2018, 9:57 AM

## 2018-01-11 NOTE — Progress Notes (Signed)
Social Work  Discharge Note  The overall goal for the admission was met for:   Discharge location: [facility] 24 HR SUPERVISION  Length of Stay: Yes-6 DAYS  Discharge activity level: Yes-SUPERVISION LEVEL  Home/community participation: Yes  Services provided included: MD, RD, PT, OT, SLP, RN, CM, Pharmacy and SW  Financial Services: Medicare  Follow-up services arranged: Outpatient: [facility]-PT,OT,SP 3/20 8:30-10:15 (PT & OT) 3/22 2:00-2:45 ( SP)  Comments (or additional information):FAMILY MEMBER HAS BEEN HERE DAILY AND ATTENDED THERAPIES WITH PT, AWARE OF RECOMMENDATION OF 24 HR SUPERVISION AT HOME.  Patient/Family verbalized understanding of follow-up arrangements: Yes  Individual responsible for coordination of the follow-up plan: FAMILY-SON AND HUSBAND  Confirmed correct DME delivered: [name] 01/11/2018    Fredna Stricker, [place]

## 2018-01-11 NOTE — Progress Notes (Signed)
Physical Therapy Discharge Summary  Patient Details  Name: Tara [name] MRN: [id number] Date of Birth: January 14, 1951  Today's Date: 01/11/2018      Patient has met 9 of 9 long term goals due to improved activity tolerance, improved balance, improved postural control, increased strength, improved attention, improved awareness and improved coordination.  Patient to discharge at an ambulatory level Modified Independent.   Patient's care partner is independent to provide the necessary physical assistance at discharge.  Reasons goals not met: N/A  Recommendation:  Patient will benefit from ongoing skilled PT services in outpatient setting to continue to advance safe functional mobility, address ongoing impairments in balance, strength, endurance, attention, and minimize fall risk.  Equipment: No equipment provided  Reasons for discharge: treatment goals met  Patient/family agrees with progress made and goals achieved: Yes  PT Discharge    Vision/Perception  Vision - Assessment Eye Alignment: Within Functional Limits Ocular Range of Motion: Within Functional Limits Tracking/Visual Pursuits: Able to track stimulus in all quads without difficulty Perception Perception: Within Functional Limits  Sensation Sensation Light Touch: Appears Intact Proprioception: Impaired Detail Proprioception Impaired Details: Impaired RUE Coordination Gross Motor Movements are Fluid and Coordinated: Yes Fine Motor Movements are Fluid and Coordinated: Yes 9 Hole Peg Test: R: 42.55sec and 35.88 sec    L: 30.05 and 25.26 Motor  Motor Motor: Hemiplegia Motor - Discharge Observations: mild R hemiplegia  Mobility Bed Mobility Bed Mobility: Rolling Right;Rolling Left;Supine to Sit;Sit to Supine Rolling Right: 6: Modified independent (Device/Increase time) Rolling Left: 6: Modified independent (Device/Increase time) Supine to Sit: 6: Modified independent (Device/Increase time) Sit to Supine: 6:  Modified independent (Device/Increase time) Transfers Sit to Stand: 6: Modified independent (Device/Increase time) Stand Pivot Transfers: 6: Modified independent (Device/Increase time) Locomotion  Ambulation Ambulation/Gait Assistance: 6: Modified independent (Device/Increase time) Ambulation Distance (Feet): 200 Feet Assistive device: None Gait Gait: Yes Gait Pattern: Impaired Gait Pattern: Poor foot clearance - right;Decreased stride length;Decreased step length - right Stairs / Additional Locomotion Stairs: Yes Stairs Assistance: 5: Supervision Stair Management Technique: One rail Left Number of Stairs: 12 Height of Stairs: 6 Wheelchair Mobility Wheelchair Mobility: No  Trunk/Postural Assessment  Cervical Assessment Cervical Assessment: Within Functional Limits Thoracic Assessment Thoracic Assessment: Within Functional Limits Lumbar Assessment Lumbar Assessment: Within Functional Limits Postural Control Postural Control: Within Functional Limits  Balance Balance Balance Assessed: Yes Static Sitting Balance Static Sitting - Level of Assistance: 6: Modified independent (Device/Increase time) Dynamic Sitting Balance Dynamic Sitting - Balance Support: During functional activity Dynamic Sitting - Level of Assistance: 6: Modified independent (Device/Increase time) Sitting balance - Comments: Sitting to complete bathing/dressing tasks Static Standing Balance Static Standing - Balance Support: No upper extremity supported Static Standing - Level of Assistance: 5: Stand by assistance;6: Modified independent (Device/Increase time) Dynamic Standing Balance Dynamic Standing - Balance Support: No upper extremity supported Dynamic Standing - Level of Assistance: 6: Modified independent (Device/Increase time);5: Stand by assistance Dynamic Standing - Comments: Standing to complete bathing/dressing tasks Extremity Assessment  RUE Assessment RUE Assessment: Within Functional  Limits(4+/5) LUE Assessment LUE Assessment: Within Functional Limits RLE Assessment RLE Assessment: Within Functional Limits LLE Assessment LLE Assessment: Within Functional Limits   See Function Navigator for Current Functional Status.  Rosita DeChalus  Rosita DeChalus, PTA 01/11/2018, 12:44 PM

## 2018-01-11 NOTE — Progress Notes (Signed)
[facility] PHYSICAL MEDICINE & REHABILITATION     PROGRESS NOTE    Subjective/Complaints:  Pt excited about d/c asking about staple removal . Is 1 wk post op, contacted Dr Vertell Limber, who oked showering and staple removal for am  ROS: Denies CP, SOB, nausea, vomiting, diarrhea.  Objective: Vital Signs: Blood pressure 140/69, pulse 60, temperature 97.6 F (36.4 C), resp. rate 16, height 5\' 6"  (1.676 m), weight 98.9 kg (218 lb 0.6 oz), SpO2 98 %. No results found. No results for input(s): WBC, HGB, HCT, PLT in the last 72 hours. Recent Labs    01/10/18 0738  NA 137  K 4.8  CL 101  GLUCOSE 118*  BUN 20  CREATININE 0.65  CALCIUM 8.9   CBG (last 3)  No results for input(s): GLUCAP in the last 72 hours.  Wt Readings from Last 3 Encounters:  01/09/18 98.9 kg (218 lb 0.6 oz)  01/05/18 99.5 kg (219 lb 5.7 oz)    Physical Exam:  General:. NAD. Vital signs reviewed. HENT:Crani-site with dressing Eyes:left orbital bruising/swelling Cardiovascular:RRRand No JVD. Respiratory:Effort normal and breath sounds normal.  GI:BS +, non-distended  Neurological: She isalert. Expressive language deficits Motor: RUE 4+/5 prox to distal.  LUE 5/5.  RLE: 4/5 hip flexion, knee extension, 4+/5 ADF.  LLE 5/5. No sensory deficits Psych: pleasant and cooperative. A little anxious  Assessment/Plan: 1. 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 Physiatrist is providing close team supervision and 24 hour management of active medical problems listed below. Physiatrist and rehab team continue to assess barriers to discharge/monitor patient progress toward functional and medical goals.  Function:  Bathing Bathing position   Position: Shower  Bathing parts Body parts bathed by patient: Right arm, Left arm, Chest, Abdomen, Front perineal area, Buttocks, Right  upper leg, Left upper leg, Right lower leg, Left lower leg, Back Body parts bathed by helper: Right lower leg, Back  Bathing assist Assist Level: Supervision or verbal cues      Upper Body Dressing/Undressing Upper body dressing   What is the patient wearing?: Pull over shirt/dress, Bra Bra - Perfomed by patient: Thread/unthread right bra strap, Thread/unthread left bra strap, Hook/unhook bra (pull down sports bra) Bra - Perfomed by helper: Hook/unhook bra (pull down sports bra) Pull over shirt/dress - Perfomed by patient: Thread/unthread right sleeve, Thread/unthread left sleeve, Put head through opening, Pull shirt over trunk          Upper body assist Assist Level: Supervision or verbal cues      Lower Body Dressing/Undressing Lower body dressing   What is the patient wearing?: Underwear, Pants, Non-skid slipper socks Underwear - Performed by patient: Thread/unthread right underwear leg, Thread/unthread left underwear leg, Pull underwear up/down   Pants- Performed by patient: Thread/unthread right pants leg, Thread/unthread left pants leg, Pull pants up/down   Non-skid slipper socks- Performed by patient: Don/doff right sock, Don/doff left sock                    Lower body assist Assist for lower body dressing: Supervision or verbal cues      Toileting Toileting   Toileting steps completed by patient: Adjust clothing prior to toileting, Performs perineal hygiene, Adjust clothing after toileting   Toileting Assistive Devices: Grab bar or rail  Toileting assist Assist level: Supervision or verbal cues   Transfers Chair/bed transfer   Chair/bed transfer method:  Ambulatory Chair/bed transfer assist level: Supervision or verbal cues       Locomotion Ambulation     Max distance: 353ft  Assist level: Touching or steadying assistance (Pt > 75%)   Wheelchair Wheelchair activity did not occur: N/A        Cognition Comprehension Comprehension assist level:  Understands complex 90% of the time/cues 10% of the time  Expression Expression assist level: Expresses basic needs/ideas: With no assist  Social Interaction Social Interaction assist level: Interacts appropriately 90% of the time - Needs monitoring or encouragement for participation or interaction.  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 Formal pathology report pending. CIR PT, OT, SLP, plan d/c in am, f/u Dr Mickeal Skinner neuro onc, F/u Dr Vertell Limber NS 2. DVT Prophylaxis/Anticoagulation: SCDs. ambulating 3. Pain Management:no current c/os 4. Mood:Provide emotional support, positive reinforcement 5. Neuropsych: This patientiscapable of making decisions on herown behalf. 6. Skin/Wound Care:remove crani dressing,may shower , should be able to remove staples prior to d/c tomorrow 7. Fluids/Electrolytes/Nutrition:      -BMET normal 3/11    BMP Latest Ref Rng & Units 01/10/2018 01/07/2018 01/06/2018  Glucose 65 - 99 mg/dL 118(H) 141(H) 152(H)  BUN 6 - 20 mg/dL 20 21(H) 18  Creatinine 0.44 - 1.00 mg/dL 0.65 0.64 0.53  Sodium 135 - 145 mmol/L 137 139 138  Potassium 3.5 - 5.1 mmol/L 4.8 4.2 3.9  Chloride 101 - 111 mmol/L 101 103 104  CO2 22 - 32 mmol/L 27 26 26   Calcium 8.9 - 10.3 mg/dL 8.9 8.4(L) 8.4(L)   8.Seizure disorder. Keppra 500 mg twice a day. EEG negative 9.Tobacco abuse. Counseling as appropriate 10.Constipation. Laxative assistance 11. HTN      Lisinopril 2.5 mg started on 3/10,cont current dose Vitals:   01/11/18 0140 01/11/18 0400  BP: 140/69   Pulse: 60   Resp: 16   Temp: 97.8 F (36.6 C) 97.6 F (36.4 C)  SpO2: 98%    12.  Hypoalbuminemia   Supplement initiated on 3/9  LOS (Days) 5 A FACE TO FACE EVALUATION WAS PERFORMED  Charlett Blake, MD 01/11/2018 8:09 AM

## 2018-01-11 NOTE — Discharge Summary (Signed)
Discharge summary job [id number]

## 2018-01-12 ENCOUNTER — Inpatient Hospital Stay (HOSPITAL_COMMUNITY): Payer: Medicare Other

## 2018-01-12 MED ORDER — LISINOPRIL 2.5 MG PO TABS: 2.5000 mg | ORAL_TABLET | Freq: Every day | ORAL | 0 refills | Status: DC

## 2018-01-12 MED ORDER — DEXAMETHASONE 4 MG PO TABS: 4.0000 mg | ORAL_TABLET | Freq: Two times a day (BID) | ORAL | 0 refills | Status: DC

## 2018-01-12 MED ORDER — PANTOPRAZOLE SODIUM 40 MG PO TBEC: 40.0000 mg | DELAYED_RELEASE_TABLET | Freq: Every day | ORAL | 0 refills | Status: DC

## 2018-01-12 MED ORDER — LEVETIRACETAM 500 MG PO TABS: 500.0000 mg | ORAL_TABLET | Freq: Two times a day (BID) | ORAL | 0 refills | Status: DC

## 2018-01-12 MED ORDER — HYDROCODONE-ACETAMINOPHEN 5-325 MG PO TABS: 1.0000 | ORAL_TABLET | ORAL | 0 refills | Status: DC | PRN

## 2018-01-12 NOTE — Progress Notes (Signed)
Patient discharged to home, accompanied by her son. 

## 2018-01-12 NOTE — Progress Notes (Signed)
Social Work   Erasto Sleight, Eliezer Champagne  Social Worker  Physical Medicine and Rehabilitation  Patient Care Conference  Signed  Date of Service:  01/11/2018  8:55 AM          Signed          '[]' Hide copied text  '[]' Hover for details   Inpatient RehabilitationTeam Conference and Plan of Care Update Date: 01/12/2018   Time: 9:57 AM      Patient Name: Tara [name]      Medical Record Number: [id number]  Date of Birth: April 05, 1951 Sex: Female         Room/Bed: 4W21C/4W21C-01 Payor Info: Payor: MEDICARE / Plan: MEDICARE PART A AND B / Product Type: *No Product type* /     Admitting Diagnosis: Brain Masss CVA  Admit Date/Time:  01/06/2018  3:43 PM Admission Comments: No comment available    Primary Diagnosis:  <principal problem not specified> Principal Problem: <principal problem not specified>       Patient Active Problem List    Diagnosis Date Noted  . Reactive hypertension    . Hypoalbuminemia due to protein-calorie malnutrition ([place])    . Seizures ([place])    . Elevated BUN    . Brain mass 01/06/2018  . Aphasia    . Abnormal CT of the head    . CNS mass 01/01/2018  . Acute metabolic encephalopathy 91/63/8466  . Headache 12/31/2017  . Hypertension 12/31/2017  . Hyperglycemia 12/31/2017  . Confusion 12/31/2017      Expected Discharge Date: Expected Discharge Date: 01/12/18   Team Members Present: Physician leading conference: Dr. Alysia Penna Social Worker Present: Ovidio Kin, LCSW Nurse Present: Rayetta Pigg, RN PT Present: Barrie Folk, PT;Rosita Dechalus, PTA OT Present: Napoleon Form, OT SLP Present: Stormy Fabian, SLP PPS Coordinator present : Daiva Nakayama, RN, CRRN       Current Status/Progress Goal Weekly Team Focus  Medical     Left frontal craniotomy wound CDI, staples removed , ok to shower, vitals are stable  home with outpt Neuro Onc f/u  transition to home environment   Bowel/Bladder          cont B & B     Swallow/Nutrition/  Hydration          Regular diet     ADL's     Supervision-mod I overall  Supervision-mod I overall  d/c planning, family ed, pt to d/c home tomorrow with recommendation for 24 hr supervision 2/2 cognitive deficits.    Mobility     mod I   mod I - supervision  d/c planning, safety awarenss, endurance, higher level balance   Communication     Supervision A word finding in conversation   Supervision A       Safety/Cognition/ Behavioral Observations   Supervision A comprehenison, Mod A selective attention and Min A awareness   Supervision A       Pain          less than 3-no pain meds needed     Skin          sutures removed yesterday wash soap and water       *See Care Plan and progress notes for long and short-term goals.      Barriers to Discharge   Current Status/Progress Possible Resolutions Date Resolved   Physician     Pending chemo/radiation     progressed well with mobility  D/C home today      Nursing  PT                    OT                 SLP            SW              Discharge Planning/Teaching Needs:    Home with husband who can flex his schedule to be there with pt and take her to OP therapies. Family has been here daily for therapies.     Team Discussion:  Pt making daily progress and has met her goals of supervision level. Sutures removed yesterday.  Family has been here for family training and is prepared for discharge. Pt to go to OP Neuro for therapies. Medically stable for DC today.  Revisions to Treatment Plan:  DC 3/13    Continued Need for Acute Rehabilitation Level of Care: The patient requires daily medical management by a physician with specialized training in physical medicine and rehabilitation for the following conditions: Daily direction of a multidisciplinary physical rehabilitation program to ensure safe treatment while eliciting the highest outcome that is of practical value to the patient.: Yes Daily medical management  of patient stability for increased activity during participation in an intensive rehabilitation regime.: Yes Daily analysis of laboratory values and/or radiology reports with any subsequent need for medication adjustment of medical intervention for : Neurological problems;Post surgical problems;Other   [name] 01/12/2018, 9:57 AM                 Patient ID: Tara [name], female   DOB: 26-Mar-1951, 67 y.o.   MRN: [id number]

## 2018-01-12 NOTE — Progress Notes (Signed)
[facility] PHYSICAL MEDICINE & REHABILITATION     PROGRESS NOTE    Subjective/Complaints:  No New issues, staples out wound looks good   ROS: Denies CP, SOB, nausea, vomiting, diarrhea.  Objective: Vital Signs: Blood pressure (!) 145/82, pulse 60, temperature (!) 97.4 F (36.3 C), temperature source Oral, resp. rate 18, height 5\' 6"  (1.676 m), weight 99 kg (218 lb 4.1 oz), SpO2 98 %. No results found. No results for input(s): WBC, HGB, HCT, PLT in the last 72 hours. Recent Labs    01/10/18 0738  NA 137  K 4.8  CL 101  GLUCOSE 118*  BUN 20  CREATININE 0.65  CALCIUM 8.9   CBG (last 3)  No results for input(s): GLUCAP in the last 72 hours.  Wt Readings from Last 3 Encounters:  01/12/18 99 kg (218 lb 4.1 oz)  01/05/18 99.5 kg (219 lb 5.7 oz)    Physical Exam:  General:. NAD. Vital signs reviewed. HENT:Crani-site with dressing Eyes:left orbital bruising/swelling Cardiovascular:RRRand No JVD. Respiratory:Effort normal and breath sounds normal.  GI:BS +, non-distended  Neurological: She isalert. Expressive language deficits Motor: RUE 4+/5 prox to distal.  LUE 5/5.  RLE: 4/5 hip flexion, knee extension, 4+/5 ADF.  LLE 5/5. No sensory deficits Psych: pleasant and cooperative. A little anxious  Assessment/Plan: 1. 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  Stable for D/C today F/u PCP in 3-4 weeks F/u PM&R 2 weeks See D/C summary See D/C instructions Function:  Bathing Bathing position   Position: Shower  Bathing parts Body parts bathed by patient: Right arm, Left arm, Chest, Abdomen, Front perineal area, Buttocks, Right upper leg, Left upper leg, Right lower leg, Left lower leg, Back Body parts bathed by helper: Right lower leg, Back  Bathing assist Assist Level: Supervision or verbal cues      Upper Body Dressing/Undressing Upper body dressing   What is the patient wearing?: Pull over shirt/dress, Bra Bra - Perfomed  by patient: Thread/unthread right bra strap, Thread/unthread left bra strap, Hook/unhook bra (pull down sports bra) Bra - Perfomed by helper: Hook/unhook bra (pull down sports bra) Pull over shirt/dress - Perfomed by patient: Thread/unthread right sleeve, Thread/unthread left sleeve, Put head through opening, Pull shirt over trunk          Upper body assist Assist Level: Supervision or verbal cues      Lower Body Dressing/Undressing Lower body dressing   What is the patient wearing?: Underwear, Pants, Non-skid slipper socks Underwear - Performed by patient: Thread/unthread right underwear leg, Thread/unthread left underwear leg, Pull underwear up/down   Pants- Performed by patient: Thread/unthread right pants leg, Thread/unthread left pants leg, Pull pants up/down   Non-skid slipper socks- Performed by patient: Don/doff right sock, Don/doff left sock                    Lower body assist Assist for lower body dressing: Supervision or verbal cues      Toileting Toileting   Toileting steps completed by patient: Adjust clothing prior to toileting, Performs perineal hygiene, Adjust clothing after toileting   Toileting Assistive Devices: Grab bar or rail  Toileting assist Assist level: Supervision or verbal cues   Transfers Chair/bed transfer   Chair/bed transfer method: Ambulatory Chair/bed transfer assist level: No Help, no cues, assistive device, takes more than a reasonable amount of time       Locomotion Ambulation     Max distance: 312ft Assist level: No help, No cues,  assistive device, takes more than a reasonable amount of time   Wheelchair Wheelchair activity did not occur: N/A        Cognition Comprehension Comprehension assist level: Follows basic conversation/direction with no assist  Expression Expression assist level: Expresses basic needs/ideas: With no assist, Expresses complex 90% of the time/cues < 10% of the time  Social Interaction Social  Interaction assist level: Interacts appropriately 90% of the time - Needs monitoring or encouragement for participation or interaction.  Problem Solving Problem solving assist level: Solves basic 90% of the time/requires cueing < 10% of the time  Memory Memory assist level: Recognizes or recalls 75 - 89% of the time/requires cueing 10 - 24% of the time   Medical Problem List and Plan: 1.Right hemiparesis and aphasiasecondary to left frontal brain mass status post craniotomy 01/04/2018, left corona radiata infarct. Formal pathology report pending. CIR PT, OT, SLP, plan d/c today f/u Dr Mickeal Skinner neuro onc, F/u Dr Vertell Limber NS 2. DVT Prophylaxis/Anticoagulation: SCDs. ambulating 3. Pain Management:no current c/os 4. Mood:Provide emotional support, positive reinforcement 5. Neuropsych: This patientiscapable of making decisions on herown behalf. 6. Skin/Wound Care:remove crani dressing,may shower , should be able to remove staples prior to d/c tomorrow 7. Fluids/Electrolytes/Nutrition:      -BMET normal 3/11    BMP Latest Ref Rng & Units 01/10/2018 01/07/2018 01/06/2018  Glucose 65 - 99 mg/dL 118(H) 141(H) 152(H)  BUN 6 - 20 mg/dL 20 21(H) 18  Creatinine 0.44 - 1.00 mg/dL 0.65 0.64 0.53  Sodium 135 - 145 mmol/L 137 139 138  Potassium 3.5 - 5.1 mmol/L 4.8 4.2 3.9  Chloride 101 - 111 mmol/L 101 103 104  CO2 22 - 32 mmol/L 27 26 26   Calcium 8.9 - 10.3 mg/dL 8.9 8.4(L) 8.4(L)   8.Seizure disorder. Keppra 500 mg twice a day. EEG negative 9.Tobacco abuse. Counseling as appropriate 10.Constipation. Laxative assistance 11. HTN      Lisinopril 2.5 mg started on 3/10,cont current dose Vitals:   01/11/18 1507 01/12/18 0552  BP: 136/69 (!) 145/82  Pulse: 69 60  Resp: 20 18  Temp: 97.9 F (36.6 C) (!) 97.4 F (36.3 C)  SpO2: 97% 98%   12. Hypoalbuminemia   Supplement initiated on 3/9  LOS (Days) 6 A FACE TO FACE EVALUATION WAS PERFORMED  Charlett Blake, MD 01/12/2018 8:46 AM

## 2018-01-14 ENCOUNTER — Telehealth: Payer: Self-pay | Admitting: *Deleted

## 2018-01-14 NOTE — Telephone Encounter (Signed)
Transitional Care call- SPOKE WITH Briannon Hanel    1. Are you/is patient experiencing any problems since coming home? Are there any questions regarding any aspect of care? NO 2. Are there any questions regarding medications administration/dosing? Are meds being taken as prescribed? Patient should review meds with caller to confirm SHE HAS ALL MEDICATION. COLACE HAS BEEN CAUSING TOO MANY BM'S (I INSTRUCED HER THAT IF SHE IS HAVING DIARRHEA AND TOO FREQUENT BMS SHE SHOULD CUT BACK TO DAILY AND SEE IF THAT HELPS) 3. Have there been any falls? NO 4. Has Home Health been to the house and/or have they contacted you? If not, have you tried to contact them? Can we help you contact them? SHE IS GOING TO OUTPT NEURO REHAB FOR THERAPY 5. Are bowels and bladder emptying properly? Are there any unexpected incontinence issues? If applicable, is patient following bowel/bladder programs?SEE PREVIOUS NOTATION ABOVE, COLACE MAKING HER HAVE TOO MANY BMS, NO PROBLEM WITH BLADDER 6. Any fevers, problems with breathing, unexpected pain? NO PROBLEMS. SHE IS TAKING ONE HYDROCODONE DAILY. INSTRUCTED TO BRING BOTTLE WITH HER TO THE APPT TO SEE DR Letta Pate 7. Are there any skin problems or new areas of breakdown? NO 8. Has the patient/family member arranged specialty MD follow up (ie cardiology/neurology/renal/surgical/etc)?  Can we help arrange?APPT GIVEN TO SEE DR Letta Pate , TOLD TO LOOK OUT FOR PAPERWORK ARRIVING IN MAIL. REVIEWED OTHER MDS SHE IS TO CONTACT-SHE HAS NUMBERS 9. Does the patient need any other services or support that we can help arrange? NO 10. Are caregivers following through as expected in assisting the patient? YES 11. Has the patient quit smoking, drinking alcohol, or using drugs as recommended? YES  Appointment Thursday @11 :30 to see Dr Letta Pate, arrive by 11:00 [place] [address] [phone]

## 2018-01-18 ENCOUNTER — Encounter (HOSPITAL_COMMUNITY): Payer: Self-pay

## 2018-01-19 ENCOUNTER — Ambulatory Visit: Payer: Medicare Other | Admitting: Occupational Therapy

## 2018-01-19 ENCOUNTER — Encounter: Payer: Self-pay | Admitting: Physical Therapy

## 2018-01-19 ENCOUNTER — Other Ambulatory Visit: Payer: Self-pay

## 2018-01-19 ENCOUNTER — Ambulatory Visit: Payer: Medicare Other | Attending: Physical Medicine & Rehabilitation | Admitting: Physical Therapy

## 2018-01-19 DIAGNOSIS — R41841 Cognitive communication deficit: Secondary | ICD-10-CM | POA: Insufficient documentation

## 2018-01-19 DIAGNOSIS — R293 Abnormal posture: Secondary | ICD-10-CM | POA: Insufficient documentation

## 2018-01-19 DIAGNOSIS — R4701 Aphasia: Secondary | ICD-10-CM | POA: Diagnosis not present

## 2018-01-19 DIAGNOSIS — R41840 Attention and concentration deficit: Secondary | ICD-10-CM | POA: Diagnosis not present

## 2018-01-19 DIAGNOSIS — R41844 Frontal lobe and executive function deficit: Secondary | ICD-10-CM | POA: Diagnosis not present

## 2018-01-19 DIAGNOSIS — M6281 Muscle weakness (generalized): Secondary | ICD-10-CM

## 2018-01-19 DIAGNOSIS — R2681 Unsteadiness on feet: Secondary | ICD-10-CM

## 2018-01-19 DIAGNOSIS — R262 Difficulty in walking, not elsewhere classified: Secondary | ICD-10-CM | POA: Insufficient documentation

## 2018-01-19 DIAGNOSIS — R278 Other lack of coordination: Secondary | ICD-10-CM | POA: Diagnosis not present

## 2018-01-19 NOTE — Therapy (Signed)
[facility] [address] [place], [place], [address] Phone: [phone]   Fax:  [phone]  Occupational Therapy Evaluation  Patient Details  Name: Tara [name] MRN: [id number] Date of Birth: 04/07/1951 Referring Provider: Alysia Penna   Encounter Date: 01/19/2018  OT End of Session - 01/19/18 1315    Visit Number  1    Number of Visits  25    Date for OT Re-Evaluation  04/19/18    Authorization Type  Medicare    OT Start Time  0935    OT Stop Time  1015    OT Time Calculation (min)  40 min    Activity Tolerance  Patient tolerated treatment well    Behavior During Therapy  [facility] for tasks assessed/performed       No past medical history on file.  Past Surgical History:  Procedure Laterality Date  . APPLICATION OF CRANIAL NAVIGATION N/A 01/04/2018   Procedure: APPLICATION OF CRANIAL NAVIGATION;  Surgeon: Erline Levine, MD;  Location: [facility];  Service: Neurosurgery;  Laterality: N/A;  . PR DURAL GRAFT REPAIR,SPINE DEFECT Left 01/04/2018   Procedure: Left Pterional craniotomy for biopsy with Brainlab;  Surgeon: Erline Levine, MD;  Location: [facility];  Service: Neurosurgery;  Laterality: Left;  Left Pterional craniotomy for biopsy with Brainlab    There were no vitals filed for this visit.  Subjective Assessment - 01/19/18 1252    Subjective   Pt denies pain    Pertinent History   seizure 01/02/2018 ,craniotomy as well as biopsy for brain mass 01/04/2018 per Dr. Vertell Limber, CVA    Patient Stated Goals  to get back my independence    Currently in Pain?  No/denies        [facility] OT Assessment - 01/19/18 0941      Assessment   Medical Diagnosis  Lt CVA; lt frontal brain mass    Referring Provider  Alysia Penna    Onset Date/Surgical Date  12/31/17    Prior Therapy  CIR      Precautions   Precautions  Fall    Precaution Comments  poor balance prior to this      Restrictions   Weight Bearing Restrictions   No      Home  Environment   Family/patient expects to be discharged to:  Private residence    Type of [place] has tub chair    Lives With  Spouse children are staying with you       Prior Function   Level of Independence  Independent    Vocation  Retired    Biomedical scientist  did the books for [place] with grandkids, arts, Scientist, research (life sciences), read, garden      ADL   Eating/Feeding  Independent    Grooming  Modified independent    Upper Body Bathing  Modified independent    Lower Body Bathing  Modified independent    Social research officer, government  Modified independent    Tub/Shower Transfer  Supervision/safety distant supervision      IADL   Shopping  Needs to be accompanied on any shopping trip    [place] light daily tasks such as dishwashing, bed making    Meal Prep  -- perfromed simple task with supervision  Medication Management  -- Family assists with setting up pillbox    Financial Management  Requires assistance      Mobility   Mobility Status  Independent      Written Expression   Dominant Hand  Right    Handwriting  100% legible;Increased time      Vision - History   Patient Visual Report  -- denies changes      Vision Assessment   Vision Assessment  Vision not tested      Cognition   Overall Cognitive Status  Impaired/Different from baseline    Area of Impairment  Attention;Safety/judgement;Awareness;Problem solving    Attention  Selective    Memory  -- difficult to assess due to aphasia    Awareness  Impaired decreased awarenss of cognitive deficits    Awareness Impairment  Intellectual impairment    Problem Solving  Impaired    Behaviors  Impulsive explains away errors    MOCA  Performs trailmaking correctly with increased time, pt reports difficulty with task, unable to correctly draw clock, draws  cube correctly      Posture/Postural Control   Posture/Postural Control  Postural limitations      Sensation   Light Touch  Appears Intact      Coordination   9 Hole Peg Test  Right;Left    Right 9 Hole Peg Test  29.81 secs decreased quality of movement    Left 9 Hole Peg Test  30.88 secs      ROM / Strength   AROM / PROM / Strength  AROM;Strength      AROM   Overall AROM   Within functional limits for tasks performed      Strength   Overall Strength  Deficits    Overall Strength Comments  RUE grossly 4-/5 to 4/5, LUE 4+/5      Hand Function   Right Hand Grip (lbs)  50    Left Hand Grip (lbs)  73                        OT Short Term Goals - 01/19/18 1302      OT SHORT TERM GOAL #1   Title  I with HEP    Time  4    Period  Weeks    Status  New    Target Date  02/18/18      OT SHORT TERM GOAL #2   Title  Pt will perform basic home management/ cooking with distant supervision demonstrating good safety awareness.    Time  4    Period  Weeks    Status  New      OT SHORT TERM GOAL #3   Title  Pt will demonstrate improved fine motor coordination for ADLs as evidenced by decreasing 9 hole peg test score by 3 secs for RUE.    Time  4    Period  Weeks    Status  New      OT SHORT TERM GOAL #4   Title  Pt will perform a basic functional organization task with no more than 2 v.c    Time  4    Period  Weeks      OT SHORT TERM GOAL #5   Title  Pt will demonstrate ability to alternate attention between 2 functional tasks with 80% or better accuracy.    Time  4    Period  Weeks    Status  New  OT Long Term Goals - 01/19/18 1305      OT LONG TERM GOAL #1   Title  I with updated HEP.    Time  12    Period  Weeks    Status  New    Target Date  04/19/18      OT LONG TERM GOAL #2   Title  Pt will perform mod complex home management and cooking at a modified independent level demonstrating good safety awareness.    Time  12    Period  Weeks     Status  New      OT LONG TERM GOAL #3   Title   Pt will perform a moderate complex organization task independently    Time  12    Period  Weeks    Status  New      OT LONG TERM GOAL #4   Title  Pt will demonstrate ability to divide attention between a physical and cognitive task with 90% or better accuracy.    Time  12    Status  New            Plan - 01/19/18 1251    Clinical Impression Statement  Pt  is a 67 y.o  hospitalized  12/31/2017 with confusion and slurred speech as well as left frontal headacheand right side weakness. CT/MRI demonstrated enhancing lesion with surrounding T2 signal consistent with likely mass. Pt had seizure 01/02/2018 .Follow-up MRI of the brain 01/03/2018 showed new area of acute infarct in the left corona radiata and external capsule. Large area of edema in the left cerebral white matter similar to the recent MRI. 4 mm shift to the right unchanged.She underwent craniotomy as well as biopsy for brain mass 01/04/2018 per Dr. Vertell Limber.Pt presents with the following deficits which impede performance of ADLS/IADLS: cognitive deficits, decreased RUE strength, decreased coordination, decreased balance. Pt can benefit from skiled occupational therapy to maximize pt's safety and independence with her daily activities.    Occupational Profile and client history currently impacting functional performance  Kemara J Matthewsis a 67 y.o.right handed femalewith history of tobacco abuse on no prescription medications.Pt is retired and lives with spouse and was independent prior to admission. Two-level home with bedroom on main level. Spouse works during the day but can stay home as needed , children are assisting   Occupational performance deficits (Please refer to evaluation for details):  IADL's;ADL's;Leisure;Social Participation    Rehab Potential  Good    Current Impairments/barriers affecting progress:  decreased awareness of deficits, await results of biopsy     OT Frequency  2x / week    OT Duration  12 weeks may d/c after 8 weeks dependent on progress    OT Treatment/Interventions  Self-care/ADL training;Therapeutic exercise;Patient/family education;Neuromuscular education;Moist Heat;Paraffin;Fluidtherapy;Therapist, nutritional;Therapeutic activities;Cognitive remediation/compensation;Passive range of motion;Manual Therapy;DME and/or AE instruction;Ultrasound;Cryotherapy    Plan  initiate HEP for RUE coordination/ strength,     Consulted and Agree with Plan of Care  Patient;Family member/caregiver    Family Member Consulted  son [name]       Patient will benefit from skilled therapeutic intervention in order to improve the following deficits and impairments:  Abnormal gait, Decreased cognition, Impaired vision/preception, Decreased mobility, Decreased coordination, Decreased activity tolerance, Decreased endurance, Decreased range of motion, Decreased strength, Impaired UE functional use, Difficulty walking, Impaired perceived functional ability, Decreased safety awareness, Decreased knowledge of precautions, Decreased balance  Visit Diagnosis: Muscle weakness (generalized) - Plan: Ot plan of care cert/re-cert  Other lack of coordination - Plan: Ot plan of care cert/re-cert  Frontal lobe and executive function deficit - Plan: Ot plan of care cert/re-cert  Attention and concentration deficit - Plan: Ot plan of care cert/re-cert  Unsteadiness on feet - Plan: Ot plan of care cert/re-cert    Problem List Patient Active Problem List   Diagnosis Date Noted  . Reactive hypertension   . Hypoalbuminemia due to protein-calorie malnutrition ([place])   . Seizures ([place])   . Elevated BUN   . Brain mass 01/06/2018  . Aphasia   . Abnormal CT of the head   . CNS mass 01/01/2018  . Acute metabolic encephalopathy 81/11/7508  . Headache 12/31/2017  . Hypertension 12/31/2017  . Hyperglycemia 12/31/2017  . Confusion 12/31/2017     RINE,KATHRYN 01/19/2018, 3:31 PM Theone Murdoch, OTR/L [phone] Phone: [phone] 3:31 PM 01/19/18 [facility] [address] [place], [place], [address] Phone: [phone]   Fax:  [phone]  Name: LEANNE SISLER MRN: [id number] Date of Birth: 1951-10-26

## 2018-01-19 NOTE — Therapy (Signed)
[facility] [address] [place], [place], [address] Phone: [phone]   Fax:  [phone]  Physical Therapy Evaluation  Patient Details  Name: Tara [name] MRN: [id number] Date of Birth: 21-Apr-1951 Referring Provider: Alysia Penna   Encounter Date: 01/19/2018  PT End of Session - 01/19/18 1416    Visit Number  1    Number of Visits  13    Date for PT Re-Evaluation  03/20/18    Authorization Type  MCR    Authorization Time Period  01/19/18 to 03/20/18    PT Start Time  0847    PT Stop Time  0928    PT Time Calculation (min)  41 min    Activity Tolerance  Patient tolerated treatment well    Behavior During Therapy  [facility] for tasks assessed/performed       History reviewed. No pertinent past medical history.  Past Surgical History:  Procedure Laterality Date  . APPLICATION OF CRANIAL NAVIGATION N/A 01/04/2018   Procedure: APPLICATION OF CRANIAL NAVIGATION;  Surgeon: Erline Levine, MD;  Location: [facility];  Service: Neurosurgery;  Laterality: N/A;  . PR DURAL GRAFT REPAIR,SPINE DEFECT Left 01/04/2018   Procedure: Left Pterional craniotomy for biopsy with Brainlab;  Surgeon: Erline Levine, MD;  Location: [facility];  Service: Neurosurgery;  Laterality: Left;  Left Pterional craniotomy for biopsy with Brainlab    There were no vitals filed for this visit.   Subjective Assessment - 01/19/18 0852    Subjective  They still don't know exactly what I had. (Don't have test results back). Need to improve my strength.     Patient is accompained by:  Family member son [name]    Pertinent History  seixure d/o, HTN    Patient Stated Goals  Be able to help my daughter with her baby ( due in one month, [place], [place])    Currently in Pain?  No/denies         [facility] PT Assessment - 01/19/18 0854      Assessment   Medical Diagnosis  Lt CVA; lt frontal brain mass    Referring Provider   Charlett Blake, MD    Onset  Date/Surgical Date  12/31/17    Prior Therapy  [facility]      Precautions   Precautions  Fall    Precaution Comments  poor balance prior to this      Restrictions   Weight Bearing Restrictions  No      Balance Screen   Has the patient fallen in the past 6 months  No    Has the patient had a decrease in activity level because of a fear of falling?   No    Is the patient reluctant to leave their home because of a fear of falling?   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 [place] with grandkids, arts, Scientist, research (life sciences), read, garden      Cognition   Overall Cognitive Status  Impaired/Different from baseline word-finding; son has not noticed other deficits      Sensation   Light Touch  Appears Intact      Coordination   Gross Motor Movements are Fluid and Coordinated  Yes    Fine Motor Movements are Fluid and Coordinated  Yes      Posture/Postural Control   Posture/Postural Control  Postural limitations    Postural Limitations  Rounded Shoulders;Forward head      ROM / Strength   AROM / PROM / Strength  AROM;Strength      AROM   Overall AROM   Within functional limits for tasks performed bil LEs      Strength   Overall Strength  Within functional limits for tasks performed    Overall Strength Comments  patient reports she can tell her RLE is weaker than LLE, however not  discernible on MMT      Transfers   Transfers  Sit to Stand    Sit to Stand  7: Independent    Five time sit to stand comments   12.07 arms across chest; norm for age 80.7 sec      Ambulation/Gait   Ambulation Distance (Feet)  75 Feet 50, 115    Assistive device  None    Gait Pattern  Step-through pattern;Decreased arm swing - right;Decreased arm swing - left;Poor foot clearance - left;Poor foot clearance - right slight drift to her left    Ambulation Surface  Level;Indoor    Gait velocity  32.8/9.09=3.61 ft/sec norm for age 17.85 ft/sec      Functional Gait  Assessment   Gait assessed   Yes    Gait Level Surface  Walks 20 ft in less than 7 sec but greater than 5.5 sec, uses assistive device, slower speed, mild gait deviations, or deviates 6-10 in outside of the 12 in walkway width.    Change in Gait Speed  Able to smoothly change walking speed without loss of balance or gait deviation. Deviate no more than 6 in outside of the 12 in walkway width.    Gait with Horizontal Head Turns  Performs head turns smoothly with slight change in gait velocity (eg, minor disruption to smooth gait path), deviates 6-10 in outside 12 in walkway width, or uses an assistive device.    Gait with Vertical Head Turns  Performs task with moderate change in gait velocity, slows down, deviates 10-15 in outside 12 in walkway width but recovers, can continue to walk.    Gait and Pivot Turn  Pivot turns safely in greater than 3 sec and stops with no loss of balance, or pivot turns safely within 3 sec and stops with mild imbalance, requires small steps to catch balance.    Step Over Obstacle  Is able to step over one shoe box (4.5 in total height) but must slow down and adjust steps to clear box safely. May require verbal cueing.    Gait with Narrow Base of Support  Ambulates 4-7 steps.    Gait with Eyes Closed  Walks 20 ft, slow speed, abnormal gait pattern, evidence for imbalance, deviates 10-15 in outside 12 in walkway  width. Requires more than 9 sec to  ambulate 20 ft.    Ambulating Backwards  Walks 20 ft, slow speed, abnormal gait pattern, evidence for imbalance, deviates 10-15 in outside 12 in walkway width.    Steps  Alternating feet, must use rail.    Total Score  16    FGA comment:  < 19 = high risk fall             Objective measurements completed on examination: See above findings.              PT Education - 01/19/18 1415    Education provided  Yes    Education Details  results of PT assessments including FGA score of 17/30 shows high fall risk    Person(s) Educated  Patient;Child(ren)    Methods  Explanation    Comprehension  Verbalized understanding       PT Short Term Goals - 01/19/18 1427      PT SHORT TERM GOAL #1   Title  Complete 6 MWT. Update LTG if appropriate. (Target 02/04/18)    Status  New    Target Date  02/04/18        PT Long Term Goals - 01/19/18 1432      PT LONG TERM GOAL #1   Title  Patient will be independent with HEP and able to verbalize a plan for continued community-based activity upon d/c from PT (Target for all LTGs 03/11/18--due to delayed start by 1.5 wks after evaluation based on census)    Target Date  03/11/18      PT LONG TERM GOAL #2   Title  Patient will score >=22/30 on FGA to demonstrate improved balance and lesser fall risk.     Baseline  16/30    Time  6    Period  Weeks    Status  New      PT LONG TERM GOAL #3   Title  Patient will improve 6 MWT to distance appropriate for her age to demonstrate improved gait, balance, and endurance.     Time  6    Period  Weeks    Status  New      PT LONG TERM GOAL #4   Title  Patient will ambulate modified independently 200 ft over unlevel outdoor terrain while engaged in cognitive challenge/dual task activity.      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 She was hospitalized acutely 12/31/17-01/06/18 and on inpatient rehab 3/7-3/13/19. She reports she was highly independent prior to hospitalization and having to rely on others is very difficult for her. She presents with impaired balance and slight LE weakness which put her at a higher risk of falls per her FGA score of 16/30. She can benefit from PT to address the deficits listed below via the interventions listed below. Patient appears to be highly motivated.     History and Personal Factors relevant to plan of care:  PMH-seixure d/o, HTN   +Personal factors- family support, active prior to illness    Clinical Presentation  Stable    Clinical Decision Making  Low    Rehab Potential  Good    Clinical Impairments Affecting Rehab Potential  ? degree of cognitive  deficits (she downplays and ?expressive difficulties make it difficult to discern cognition vs language deficit)    PT Frequency  2x / week    PT Duration  6 weeks    PT Treatment/Interventions  ADLs/Self Care Home Management;Gait training;Cognitive remediation;Neuromuscular re-education;Balance training;Therapeutic exercise;Therapeutic activities;Functional mobility training;Stair training;Patient/family education;Passive range of motion    PT Next Visit Plan  check 6 MWT and compare to norm for her age; review what HEP she is doing (from CIR) and update addressing balance deficits more than LE strength     Consulted and Agree with Plan of Care  Patient;Family member/caregiver    Family Member Consulted  son, [name]       Patient will benefit from skilled therapeutic intervention in order to improve the following deficits and impairments:  Decreased balance, Decreased cognition, Decreased strength, Decreased safety awareness, Difficulty walking, Postural dysfunction  Visit Diagnosis: Unsteadiness on feet - Plan: PT plan of care cert/re-cert  Abnormal  posture - Plan: PT plan of care cert/re-cert  Difficulty in walking, not elsewhere classified - Plan: PT plan of care cert/re-cert  Muscle weakness (generalized) - Plan: PT plan of care cert/re-cert     Problem List Patient Active Problem List   Diagnosis Date Noted  . Reactive hypertension   . Hypoalbuminemia due to protein-calorie malnutrition ([place])   . Seizures ([place])   . Elevated BUN   . Brain mass 01/06/2018  . Aphasia   . Abnormal CT of the head   . CNS mass 01/01/2018  . Acute metabolic encephalopathy 93/79/0240  . Headache 12/31/2017  . Hypertension 12/31/2017  . Hyperglycemia 12/31/2017  . Confusion 12/31/2017    Rexanne Mano, PT 01/19/2018, 2:43 PM  [facility] [address] [place], [place], [address] Phone: [phone]   Fax:  [phone]  Name: Tara [name] MRN: [id number] Date of Birth: [birth year]

## 2018-01-20 ENCOUNTER — Inpatient Hospital Stay: Payer: Medicare Other | Attending: Internal Medicine | Admitting: Internal Medicine

## 2018-01-20 ENCOUNTER — Encounter: Payer: Self-pay | Admitting: Internal Medicine

## 2018-01-20 ENCOUNTER — Other Ambulatory Visit: Payer: Self-pay

## 2018-01-20 VITALS — BP 161/83 | HR 65 | Temp 98.6°F | Resp 17 | Ht 66.0 in | Wt 216.9 lb

## 2018-01-20 DIAGNOSIS — Z800 Family history of malignant neoplasm of digestive organs: Secondary | ICD-10-CM | POA: Insufficient documentation

## 2018-01-20 DIAGNOSIS — I639 Cerebral infarction, unspecified: Secondary | ICD-10-CM | POA: Diagnosis not present

## 2018-01-20 DIAGNOSIS — F17210 Nicotine dependence, cigarettes, uncomplicated: Secondary | ICD-10-CM | POA: Diagnosis not present

## 2018-01-20 DIAGNOSIS — G9389 Other specified disorders of brain: Secondary | ICD-10-CM

## 2018-01-20 DIAGNOSIS — Z79899 Other long term (current) drug therapy: Secondary | ICD-10-CM | POA: Diagnosis not present

## 2018-01-20 DIAGNOSIS — G939 Disorder of brain, unspecified: Secondary | ICD-10-CM | POA: Insufficient documentation

## 2018-01-21 ENCOUNTER — Telehealth: Payer: Self-pay

## 2018-01-21 ENCOUNTER — Ambulatory Visit: Payer: Medicare Other | Admitting: Speech Pathology

## 2018-01-21 ENCOUNTER — Ambulatory Visit: Payer: Medicare Other | Admitting: Occupational Therapy

## 2018-01-21 ENCOUNTER — Encounter: Payer: Self-pay | Admitting: Speech Pathology

## 2018-01-21 VITALS — BP 123/74

## 2018-01-21 DIAGNOSIS — M6281 Muscle weakness (generalized): Secondary | ICD-10-CM

## 2018-01-21 DIAGNOSIS — R278 Other lack of coordination: Secondary | ICD-10-CM

## 2018-01-21 DIAGNOSIS — I639 Cerebral infarction, unspecified: Secondary | ICD-10-CM | POA: Insufficient documentation

## 2018-01-21 DIAGNOSIS — R41844 Frontal lobe and executive function deficit: Secondary | ICD-10-CM | POA: Diagnosis not present

## 2018-01-21 DIAGNOSIS — R293 Abnormal posture: Secondary | ICD-10-CM | POA: Diagnosis not present

## 2018-01-21 DIAGNOSIS — R41840 Attention and concentration deficit: Secondary | ICD-10-CM

## 2018-01-21 DIAGNOSIS — R4701 Aphasia: Secondary | ICD-10-CM

## 2018-01-21 DIAGNOSIS — R262 Difficulty in walking, not elsewhere classified: Secondary | ICD-10-CM | POA: Diagnosis not present

## 2018-01-21 DIAGNOSIS — R2681 Unsteadiness on feet: Secondary | ICD-10-CM | POA: Diagnosis not present

## 2018-01-21 MED ORDER — ASPIRIN EC 81 MG PO TBEC: 81.0000 mg | DELAYED_RELEASE_TABLET | Freq: Every day | ORAL | Status: AC

## 2018-01-21 NOTE — Progress Notes (Signed)
[facility] [place], [place] [phone]   New Patient Evaluation  Date of Service: 01/21/18 Patient Name: Tara [name] Patient MRN: [id number] Patient DOB: [birth year] Provider: [name], MD  Identifying Statement:  Tara [name] is a 67 y.o. female with left frontal brain lesion who presents for initial consultation and evaluation.     History of Present Illness: The patient's records from the referring physician were obtained and reviewed and the patient interviewed to confirm this HPI.  Tara [name] presented to medical attention in early March, with complaints of word finding difficulty and disorganized speech pattern.  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  Preceding these symptoms, she described 4 days of new frontal headache, worse in the morning, in context of notable absence of any headache history.  Upon arrival to the emergency department a practitioner noted some weakness affecting the right arm and leg.  She had remained ambulatory up to that point.  Brain imaging was performed and demonstrated an enhancing lesion with mass effect abutting the left MCA near the sylvian fissure, as well as well circumscribed surrounding T2 signal hyperintensity.  Steroids were administered and followed by subjective clinical improvement.  Repeat MRI demonstrated moderate improvement in volume of enhancement, but also several small regions of restricted diffusion in the left frontal lobe consistent with possible acute stroke.  Give concern for underlying neoplasm and normal systemic workup, she underwent brain biopsy with Dr. Vertell Limber, which was locally non-diagnostic.  Tissue was then sent to [facility] for secondary review.  Since surgery, [name] feels essentially at her baseline.  She occasionally has difficulty getting some words out  but not at a level that interferes with her day to day functioning.  She denies further headaches.          Medications: Current Outpatient Medications on File Prior to Visit  Medication Sig Dispense Refill  . dexamethasone (DECADRON) 4 MG tablet Take 1 tablet (4 mg total) by mouth every 12 (twelve) hours. 60 tablet 0  . levETIRAcetam (KEPPRA) 500 MG tablet Take 1 tablet (500 mg total) by mouth 2 (two) times daily. 60 tablet 0  . lisinopril (PRINIVIL,ZESTRIL) 2.5 MG tablet Take 1 tablet (2.5 mg total) by mouth at bedtime. 30 tablet 0  . Multiple Vitamin (MULTIVITAMIN WITH MINERALS) TABS tablet Take 1 tablet by mouth daily.    . pantoprazole (PROTONIX) 40 MG tablet Take 1 tablet (40 mg total) by mouth daily. 30 tablet 0  . acetaminophen (TYLENOL) 325 MG tablet Take 2 tablets (650 mg total) by mouth every 4 (four) hours as needed for mild pain (temp > 100.5). (Patient not taking: Reported on 01/20/2018) 30 tablet 0  . docusate sodium (COLACE) 100 MG capsule Take 1 capsule (100 mg total) by mouth 2 (two) times daily. (Patient not taking: Reported on 01/19/2018) 10 capsule 0  . HYDROcodone-acetaminophen (NORCO/VICODIN) 5-325 MG tablet Take 1 tablet by mouth every 4 (four) hours as needed for moderate pain. (Patient not taking: Reported on 01/19/2018) 20 tablet 0   No current facility-administered medications on file prior to visit.     Allergies: No Known Allergies Past Medical History: History reviewed. No pertinent past medical history. Past Surgical History:  Past Surgical History:  Procedure Laterality Date  . APPLICATION OF CRANIAL NAVIGATION N/A 01/04/2018   Procedure: APPLICATION OF CRANIAL NAVIGATION;  Surgeon: Erline Levine, MD;  Location: [facility];  Service: Neurosurgery;  Laterality: N/A;  . PR DURAL GRAFT REPAIR,SPINE DEFECT Left 01/04/2018   Procedure: Left Pterional craniotomy for biopsy with Brainlab;  Surgeon: Erline Levine, MD;  Location: [facility];  Service: Neurosurgery;  Laterality: Left;   Left Pterional craniotomy for biopsy with Brainlab   Social History:  Social History   Socioeconomic History  . Marital status: Married    Spouse name: Not on file  . Number of children: Not on file  . Years of education: Not on file  . Highest education level: Not on file  Occupational History  . Not on file  Social Needs  . Financial resource strain: Not on file  . Food insecurity:    Worry: Not on file    Inability: Not on file  . Transportation needs:    Medical: Not on file    Non-medical: Not on file  Tobacco Use  . Smoking status: Current Every Day Smoker    Packs/day: 1.00    Types: Cigarettes  . Smokeless tobacco: Never Used  . Tobacco comment: plans to quit now  Substance and Sexual Activity  . Alcohol use: No    Frequency: Never  . Drug use: No  . Sexual activity: Yes  Lifestyle  . Physical activity:    Days per week: Not on file    Minutes per session: Not on file  . Stress: Not on file  Relationships  . Social connections:    Talks on phone: Not on file    Gets together: Not on file    Attends religious service: Not on file    Active member of club or organization: Not on file    Attends meetings of clubs or organizations: Not on file    Relationship status: Not on file  . Intimate partner violence:    Fear of current or ex partner: Not on file    Emotionally abused: Not on file    Physically abused: Not on file    Forced sexual activity: Not on file  Other Topics Concern  . Not on file  Social History Narrative  . Not on file   Family History:  Family History  Problem Relation Age of Onset  . Colon cancer Mother   . CAD Father     Review of Systems: Constitutional: Denies fevers, chills or abnormal weight loss Eyes: Denies blurriness of vision Ears, nose, mouth, throat, and face: Denies mucositis or sore throat Respiratory: Denies cough, dyspnea or wheezes Cardiovascular: Denies palpitation, chest discomfort or lower extremity  swelling Gastrointestinal:  Denies nausea, constipation, diarrhea GU: Denies dysuria or incontinence Skin: Denies abnormal skin rashes Neurological: Per HPI Musculoskeletal: Denies joint pain, back or neck discomfort. No decrease in ROM Behavioral/Psych: Denies anxiety, disturbance in thought content, and mood instability  Physical Exam: Vitals:   01/20/18 1119  BP: (!) 161/83  Pulse: 65  Resp: 17  Temp: 98.6 F (37 C)  SpO2: 100%   KPS: 90. General: Alert, cooperative, pleasant, in no acute distress Head: Craniotomy scar noted, dry and intact. EENT: No conjunctival injection or scleral icterus. Oral mucosa moist Lungs: Resp effort normal Cardiac: Regular rate and rhythm Abdomen: Soft, non-distended abdomen Skin: No rashes cyanosis or petechiae. Extremities: No clubbing or edema  Neurologic Exam: Mental Status: Awake, alert, attentive to examiner. Oriented to self and environment. Language is fluent with intact comprehension.  Cranial Nerves: Visual acuity is grossly normal. Visual fields are full. Extra-ocular movements  intact. No ptosis. Face is symmetric, tongue midline. Motor: Tone and bulk are normal. Power is full in both arms and legs. Reflexes are symmetric, no pathologic reflexes present. Intact finger to nose bilaterally Sensory: Intact to light touch and temperature Gait: Normal and tandem gait is normal.   Labs: I have reviewed the data as listed    Component Value Date/Time   NA 137 01/10/2018 0738   K 4.8 01/10/2018 0738   CL 101 01/10/2018 0738   CO2 27 01/10/2018 0738   GLUCOSE 118 (H) 01/10/2018 0738   BUN 20 01/10/2018 0738   CREATININE 0.65 01/10/2018 0738   CALCIUM 8.9 01/10/2018 0738   PROT 5.7 (L) 01/07/2018 0539   ALBUMIN 3.0 (L) 01/07/2018 0539   AST 30 01/07/2018 0539   ALT 87 (H) 01/07/2018 0539   ALKPHOS 75 01/07/2018 0539   BILITOT 0.7 01/07/2018 0539   GFRNONAA >60 01/10/2018 0738   GFRAA >60 01/10/2018 0738   Lab Results   Component Value Date   WBC 9.8 01/07/2018   NEUTROABS 8.3 (H) 01/07/2018   HGB 12.8 01/07/2018   HCT 39.3 01/07/2018   MCV 95.4 01/07/2018   PLT 330 01/07/2018    Imaging:  Ct Head Wo Contrast  Result Date: 01/01/2018 CLINICAL DATA:  Slurred speech and right-sided weakness. EXAM: CT HEAD WITHOUT CONTRAST TECHNIQUE: Contiguous axial images were obtained from the base of the skull through the vertex without intravenous contrast. COMPARISON:  Head CT and brain MRI dated 12/31/2017 FINDINGS: Brain: The masslike area of heterogeneous attenuation, primarily hypoattenuation, surrounding the central left middle cerebral artery and extending from the left temporal lobe through the base a ganglia to the left centrum semiovale, is without change from the previous day's exams. There is still mild midline shift to the right currently measuring 4 mm. There are no new areas of abnormal parenchymal attenuation and no new areas of mass effect. No intracranial hemorrhage. Vascular: No hyperdense vessel or unexpected calcification. Skull: Normal. Negative for fracture or focal lesion. Sinuses/Orbits: Globes and orbits are unremarkable. Sinuses and mastoid air cells are clear. Other: None. IMPRESSION: 1. No significant change from the previous day's exams. 2. Masslike area of abnormal attenuation, primarily hypoattenuation, with associated mass effect, on the left, centered on the left basal ganglia and surrounding the left middle cerebral artery, is unchanged in extent and degree of mass effect. 3. No new abnormalities.  No intracranial hemorrhage. Electronically Signed   By: Lajean Manes M.D.   On: 01/01/2018 11:35   Ct Head Wo Contrast  Result Date: 12/31/2017 CLINICAL DATA:  Altered level of consciousness. EXAM: CT HEAD WITHOUT CONTRAST TECHNIQUE: Contiguous axial images were obtained from the base of the skull through the vertex without intravenous contrast. COMPARISON:  None. FINDINGS: Brain: Large amount of  white matter edema is seen in the left basal ganglia and temporal lobe. This is concerning for underlying neoplasm. This results in 9 mm of left-to-right midline shift. No ventricular dilatation is noted. No definite hemorrhage is noted. Vascular: No hyperdense vessel or unexpected calcification. Skull: Normal. Negative for fracture or focal lesion. Sinuses/Orbits: No acute finding. Other: None. IMPRESSION: Large amount of white matter edema is seen in the left basal ganglia and left temporal lobe concerning for underlying neoplasm. This results in 9 mm of left-to-right midline shift. Further evaluation with MRI with and without gadolinium administration is recommended. Electronically Signed   By: Marijo Conception, M.D.   On: 12/31/2017 21:32   Ct Chest W  Contrast  Result Date: 01/01/2018 CLINICAL DATA:  Findings worrisome for CNS lymphoma versus glioma brain MRI. For staging. EXAM: CT CHEST, ABDOMEN, AND PELVIS WITH CONTRAST TECHNIQUE: Multidetector CT imaging of the chest, abdomen and pelvis was performed following the standard protocol during bolus administration of intravenous contrast. CONTRAST:  167mL ISOVUE-300 IOPAMIDOL (ISOVUE-300) INJECTION 61% COMPARISON:  None. FINDINGS: CT CHEST FINDINGS Cardiovascular: Heart is normal in size.  No pericardial effusion. No evidence of thoracic aortic aneurysm. Mediastinum/Nodes: No suspicious mediastinal, hilar, or axillary lymphadenopathy. Visualized thyroid is unremarkable. Lungs/Pleura: Mild dependent atelectasis in the bilateral lower lobes and posterior left upper lobe. No suspicious pulmonary nodules. No focal consolidation. No pleural effusion or pneumothorax. Musculoskeletal: Visualized osseous structures are within normal limits. CT ABDOMEN PELVIS FINDINGS Hepatobiliary: 13 mm cyst in segment 5 of the liver (series 3/image 56). Layering gallstone (series 3/image 34). No associated inflammatory changes. No intrahepatic or extrahepatic ductal dilatation.  Pancreas: Within normal limits. Spleen: Spleen is normal in size. Adrenals/Urinary Tract: Adrenal glands are within normal limits. 3 mm nonobstructing left lower pole renal calculus (series 3/image 70). Multiple left renal cysts, measuring up to 5.1 cm in the posterior left lower kidney (series 3/image 69), measuring simple fluid density (Bosniak I). Right kidney is within normal limits. No hydronephrosis. Bladder is within normal limits. Stomach/Bowel: Stomach is notable for a tiny hiatal hernia. No evidence of bowel obstruction. Normal appendix (series 3/image 92). Mild sigmoid diverticulosis, without evidence of diverticulitis. Vascular/Lymphatic: No evidence of abdominal aortic aneurysm. Atherosclerotic calcifications of the abdominal aorta and branch vessels. No suspicious abdominopelvic lymphadenopathy. Reproductive: Uterus is within normal limits. Bilateral ovaries are within normal limits. Other: No abdominopelvic ascites. Musculoskeletal: Mild degenerative changes of the lumbar spine. IMPRESSION: No findings suspicious for malignancy (primary or metastatic) in the chest, abdomen, or pelvis. No suspicious lymphadenopathy.  Spleen is normal in size. 3 mm nonobstructing left lower pole renal calculus. Multiple left renal cysts, benign (Bosniak I). Cholelithiasis, without associated inflammatory changes. Electronically Signed   By: Julian Hy M.D.   On: 01/01/2018 09:12   Mr Jeri Cos OZ Contrast  Result Date: 01/03/2018 CLINICAL DATA:  Primary CNS lymphoma.  Surgical planning EXAM: MRI HEAD WITHOUT AND WITH CONTRAST TECHNIQUE: Multiplanar, multiecho pulse sequences of the brain and surrounding structures were obtained without and with intravenous contrast. CONTRAST:  16mL MULTIHANCE GADOBENATE DIMEGLUMINE 529 MG/ML IV SOLN COMPARISON:  MRI head 12/31/2017, CT head 12/31/2017 FINDINGS: Brain: Large area of edema surrounding the left sylvian fissure similar to the prior MRI. This involves much of the  temporal lobe as well as the left posterior frontal lobe, and the left frontal and operculum and left parietal operculum. The prior MRI demonstrated enhancement along the left middle cerebral artery in the sylvian fissure most compatible with tumor. Negative for hemorrhage. Contrast not administered today. Mild mass-effect on the left lateral ventricle with 4 mm midline shift to the right. 10 x 15 mm focus of restricted diffusion left periventricular white matter and basal ganglia is new since the prior study, consistent with acute infarction. Small hyperintensity in the right parietal white matter unchanged. 1 cm lesion in the left cerebellum hyperintense on T2 with facilitated diffusion also unchanged. Vascular: Normal arterial flow voids. Skull and upper cervical spine: Negative Sinuses/Orbits: Negative Other: None IMPRESSION: Large area of edema in the left cerebral white matter similar to the recent MRI. Contrast not administered today however abnormal enhancement along the left middle cerebral artery in the sylvian fissure is seen previously, most  likely tumor. 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 4 mm midline shift to the right unchanged. 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 Small hyperintensities left cerebellum and right periventricular white matter likely due to chronic ischemia. These are unchanged. Electronically Signed   By: Franchot Gallo M.D.   On: 01/03/2018 16:47   Mr Brain W And Wo Contrast  Result Date: 01/01/2018 CLINICAL DATA:  Confusion and mass demonstrated on earlier head CT EXAM: MRI HEAD WITHOUT AND WITH CONTRAST MRA HEAD WITHOUT CONTRAST TECHNIQUE: Multiplanar, multiecho pulse sequences of the brain and surrounding structures were obtained without and with intravenous contrast. Angiographic images of the head were obtained using MRA technique without contrast. CONTRAST:  4mL MULTIHANCE GADOBENATE DIMEGLUMINE 529 MG/ML IV SOLN COMPARISON:  Head CT 12/31/2017  FINDINGS: MRI HEAD FINDINGS Brain: The midline structures are normal. No focal diffusion restriction to indicate acute infarct. No intraparenchymal hemorrhage. Large amount of hyperintense T2-weighted signal in the left basal ganglia, left insula and left temporal lobe. There is an area of contrast enhancement within the brain parenchyma following the left middle cerebral artery M1 and proximal M2 segments. The largest confluent area of enhancement measures 2.2 x 1.3 cm. Rightward midline shift of 6 mm is unchanged. No chronic microhemorrhage or cerebral amyloid angiopathy. No hydrocephalus, age advanced atrophy or lobar predominant volume loss. No dural abnormality or extra-axial collection. Skull and upper cervical spine: The visualized skull base, calvarium, upper cervical spine and extracranial soft tissues are normal. Sinuses/Orbits: No fluid levels or advanced mucosal thickening. No mastoid effusion. Normal orbits. MRA HEAD FINDINGS Intracranial internal carotid arteries: Normal. Anterior cerebral arteries: Normal. Middle cerebral arteries: Normal. Posterior communicating arteries: Present on the right. Posterior cerebral arteries: Normal. Fetal origin on the right. Basilar artery: Normal. Vertebral arteries: Left dominant. Normal. Superior cerebellar arteries: Normal. Anterior inferior cerebellar arteries: Normal. Posterior inferior cerebellar arteries: Normal. IMPRESSION: 1. Masslike parenchymal contrast enhancement adjacent to the left MCA M1 and proximal M2 segments with large area of surrounding hyperintense T2-weighted signal. The appearance is concerning for lymphoma -- including the intravascular variant -- with a large amount of surrounding edema. A primary CNS neoplasm, most likely a high grade glioma, is also a possibility, in which case the abnormal T2WI hyperintensity could indicate edema or non-enhancing tumor. Vasculitis is less likely in the context of normal MRA, but remains a consideration  (MRA is less sensitive for detecting findings of vasculitis than CTA or conventional angiography). 2. Normal MRA. 3. Unchanged 6 mm rightward midline shift. Electronically Signed   By: Ulyses Jarred M.D.   On: 01/01/2018 00:39   Ct Abdomen Pelvis W Contrast  Result Date: 01/01/2018 CLINICAL DATA:  Findings worrisome for CNS lymphoma versus glioma brain MRI. For staging. EXAM: CT CHEST, ABDOMEN, AND PELVIS WITH CONTRAST TECHNIQUE: Multidetector CT imaging of the chest, abdomen and pelvis was performed following the standard protocol during bolus administration of intravenous contrast. CONTRAST:  135mL ISOVUE-300 IOPAMIDOL (ISOVUE-300) INJECTION 61% COMPARISON:  None. FINDINGS: CT CHEST FINDINGS Cardiovascular: Heart is normal in size.  No pericardial effusion. No evidence of thoracic aortic aneurysm. Mediastinum/Nodes: No suspicious mediastinal, hilar, or axillary lymphadenopathy. Visualized thyroid is unremarkable. Lungs/Pleura: Mild dependent atelectasis in the bilateral lower lobes and posterior left upper lobe. No suspicious pulmonary nodules. No focal consolidation. No pleural effusion or pneumothorax. Musculoskeletal: Visualized osseous structures are within normal limits. CT ABDOMEN PELVIS FINDINGS Hepatobiliary: 13 mm cyst in segment 5 of the liver (series 3/image 56). Layering gallstone (series  3/image 34). No associated inflammatory changes. No intrahepatic or extrahepatic ductal dilatation. Pancreas: Within normal limits. Spleen: Spleen is normal in size. Adrenals/Urinary Tract: Adrenal glands are within normal limits. 3 mm nonobstructing left lower pole renal calculus (series 3/image 70). Multiple left renal cysts, measuring up to 5.1 cm in the posterior left lower kidney (series 3/image 69), measuring simple fluid density (Bosniak I). Right kidney is within normal limits. No hydronephrosis. Bladder is within normal limits. Stomach/Bowel: Stomach is notable for a tiny hiatal hernia. No evidence of  bowel obstruction. Normal appendix (series 3/image 92). Mild sigmoid diverticulosis, without evidence of diverticulitis. Vascular/Lymphatic: No evidence of abdominal aortic aneurysm. Atherosclerotic calcifications of the abdominal aorta and branch vessels. No suspicious abdominopelvic lymphadenopathy. Reproductive: Uterus is within normal limits. Bilateral ovaries are within normal limits. Other: No abdominopelvic ascites. Musculoskeletal: Mild degenerative changes of the lumbar spine. IMPRESSION: No findings suspicious for malignancy (primary or metastatic) in the chest, abdomen, or pelvis. No suspicious lymphadenopathy.  Spleen is normal in size. 3 mm nonobstructing left lower pole renal calculus. Multiple left renal cysts, benign (Bosniak I). Cholelithiasis, without associated inflammatory changes. Electronically Signed   By: Julian Hy M.D.   On: 01/01/2018 09:12   Mr Jodene Nam Head (cerebral Arteries)  Result Date: 01/01/2018 CLINICAL DATA:  Confusion and mass demonstrated on earlier head CT EXAM: MRI HEAD WITHOUT AND WITH CONTRAST MRA HEAD WITHOUT CONTRAST TECHNIQUE: Multiplanar, multiecho pulse sequences of the brain and surrounding structures were obtained without and with intravenous contrast. Angiographic images of the head were obtained using MRA technique without contrast. CONTRAST:  52mL MULTIHANCE GADOBENATE DIMEGLUMINE 529 MG/ML IV SOLN COMPARISON:  Head CT 12/31/2017 FINDINGS: MRI HEAD FINDINGS Brain: The midline structures are normal. No focal diffusion restriction to indicate acute infarct. No intraparenchymal hemorrhage. Large amount of hyperintense T2-weighted signal in the left basal ganglia, left insula and left temporal lobe. There is an area of contrast enhancement within the brain parenchyma following the left middle cerebral artery M1 and proximal M2 segments. The largest confluent area of enhancement measures 2.2 x 1.3 cm. Rightward midline shift of 6 mm is unchanged. No chronic  microhemorrhage or cerebral amyloid angiopathy. No hydrocephalus, age advanced atrophy or lobar predominant volume loss. No dural abnormality or extra-axial collection. Skull and upper cervical spine: The visualized skull base, calvarium, upper cervical spine and extracranial soft tissues are normal. Sinuses/Orbits: No fluid levels or advanced mucosal thickening. No mastoid effusion. Normal orbits. MRA HEAD FINDINGS Intracranial internal carotid arteries: Normal. Anterior cerebral arteries: Normal. Middle cerebral arteries: Normal. Posterior communicating arteries: Present on the right. Posterior cerebral arteries: Normal. Fetal origin on the right. Basilar artery: Normal. Vertebral arteries: Left dominant. Normal. Superior cerebellar arteries: Normal. Anterior inferior cerebellar arteries: Normal. Posterior inferior cerebellar arteries: Normal. IMPRESSION: 1. Masslike parenchymal contrast enhancement adjacent to the left MCA M1 and proximal M2 segments with large area of surrounding hyperintense T2-weighted signal. The appearance is concerning for lymphoma -- including the intravascular variant -- with a large amount of surrounding edema. A primary CNS neoplasm, most likely a high grade glioma, is also a possibility, in which case the abnormal T2WI hyperintensity could indicate edema or non-enhancing tumor. Vasculitis is less likely in the context of normal MRA, but remains a consideration (MRA is less sensitive for detecting findings of vasculitis than CTA or conventional angiography). 2. Normal MRA. 3. Unchanged 6 mm rightward midline shift. Electronically Signed   By: Ulyses Jarred M.D.   On: 01/01/2018 00:39    Pathology:  Assessment/Plan Brain mass  Cerebrovascular accident (CVA), unspecified mechanism ([place])  Ms. Wilmarth has a clinical and radiographic presentation without a clear underlying pathology at this time.  Careful surgical sections were taken of the enhancing region and are  non-diagnostic even on expert review.  Although there are likely small subclinical infarcts present, I don't believe that stroke can account for the bulk of the visualized region of T2 abnormality, especially given the clinical syndrome and the lack of DWI signal intensity overlapping this territory.  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  Despite "negative" path, neoplasm is still not ruled out here.   Although MRA was normal, she should complete stroke evaluation with dedicated transthoracic echocardiogram.  We recommended she begin Aspirin 81mg  daily.  She will see her PCP for workup and initial management of blood pressure and blood lipids.  We counseled her on smoking cessation and its importance in secondary stroke prevention.  We recommended daily aerobic exercised and balanced diet.    Referral for second opinion will be placed for consultation with Dr. Encarnacion Slates, or colleagues, Neurologist at [facility].  We will also refer for review of histology at [facility]    In lieu of feedback from Dr. Ferdinand Lango, we will plan to repeat brain MRI in early May.   Decadron can be lowered at this time to 4mg  BID.      We appreciate the opportunity to participate in the care of [employer]   All questions were answered. The patient knows to call the clinic with any problems, questions or concerns. No barriers to learning were detected.  The total time spent in the encounter was 60 minutes and more than 50% was on counseling and review of test results   [name], MD Medical Director of Neuro-Oncology [facility] [place] 01/21/18 12:23 PM

## 2018-01-21 NOTE — Telephone Encounter (Signed)
Spoke with patient concerning upcoming appointment. Will mail calender and letter. Per 3/21 los

## 2018-01-21 NOTE — Patient Instructions (Signed)
   Cognitive Activities you can do at home:   - [place] (easy level)  - [place]  On your computer, tablet or phone: Brain[employer] IQ Logic  Language games Family fued Heads up  [place]  Naming things in categories

## 2018-01-21 NOTE — Therapy (Signed)
[facility] [address] [place], [place], [address] Phone: [phone]   Fax:  [phone]  Occupational Therapy Treatment  Patient Details  Name: Tara [name] MRN: [id number] Date of Birth: [birth year] Referring Provider: Alysia Penna   Encounter Date: 01/21/2018  OT End of Session - 01/21/18 1015    Visit Number  2    Number of Visits  25    Date for OT Re-Evaluation  04/19/18    Authorization Type  Medicare    Authorization Time Period  goals written for 12 weeks, may d/c after 8 weeks depending on progress    OT Start Time  0935    OT Stop Time  1017    OT Time Calculation (min)  42 min    Activity Tolerance  Patient tolerated treatment well    Behavior During Therapy  [facility] for tasks assessed/performed       No past medical history on file.  Past Surgical History:  Procedure Laterality Date  . APPLICATION OF CRANIAL NAVIGATION N/A 01/04/2018   Procedure: APPLICATION OF CRANIAL NAVIGATION;  Surgeon: Erline Levine, MD;  Location: [place];  Service: Neurosurgery;  Laterality: N/A;  . PR DURAL GRAFT REPAIR,SPINE DEFECT Left 01/04/2018   Procedure: Left Pterional craniotomy for biopsy with Brainlab;  Surgeon: Erline Levine, MD;  Location: [place];  Service: Neurosurgery;  Laterality: Left;  Left Pterional craniotomy for biopsy with Brainlab    Vitals:   01/21/18 1014  BP: 123/74                 treatment: Copying small peg design for RUE fine motor coordination with cognitive component, 1 v.c for design, pt removed pegs with in hand manipulation, min v.c.         OT Education - 01/21/18 1246    Education provided  Yes    Education Details  RUE red putty and coordination HEP    Person(s) Educated  Patient;Child(ren)    Methods  Explanation;Demonstration;Verbal cues;Handout    Comprehension  Returned demonstration;Verbalized understanding;Verbal cues required cueing to avoid shoulder hike  and to stay in midline   cueing to avoid shoulder hike and to stay in midline      OT Short Term Goals - 01/19/18 1302      OT SHORT TERM GOAL #1   Title  I with HEP    Time  4    Period  Weeks    Status  New    Target Date  02/18/18      OT SHORT TERM GOAL #2   Title  Pt will perform basic home management/ cooking with distant supervision demonstrating good safety awareness.    Time  4    Period  Weeks    Status  New      OT SHORT TERM GOAL #3   Title  Pt will demonstrate improved fine motor coordination for ADLs as evidenced by decreasing 9 hole peg test score by 3 secs for RUE.    Time  4    Period  Weeks    Status  New      OT SHORT TERM GOAL #4   Title  Pt will perform a basic functional organization task with no more than 2 v.c    Time  4    Period  Weeks      OT SHORT TERM GOAL #5   Title  Pt will demonstrate ability to alternate attention between 2 functional tasks with 80%  or better accuracy.    Time  4    Period  Weeks    Status  New        OT Long Term Goals - 01/19/18 1305      OT LONG TERM GOAL #1   Title  I with updated HEP.    Time  12    Period  Weeks    Status  New    Target Date  04/19/18      OT LONG TERM GOAL #2   Title  Pt will perform mod complex home management and cooking at a modified independent level demonstrating good safety awareness.    Time  12    Period  Weeks    Status  New      OT LONG TERM GOAL #3   Title   Pt will perform a moderate complex organization task independently    Time  12    Period  Weeks    Status  New      OT LONG TERM GOAL #4   Title  Pt will demonstrate ability to divide attention between a physical and cognitive task with 90% or better accuracy.    Time  12    Status  New            Plan - 01/21/18 1016    Clinical Impression Statement  Pt is progressing towards goals. She demonstrates understanding of fine motor and putty HEP. Pt attended well to peg design task with only 1 error.    Rehab  Potential  Good    Current Impairments/barriers affecting progress:  decreased awareness of deficits, await results of biopsy    OT Frequency  2x / week    OT Duration  12 weeks    OT Treatment/Interventions  Self-care/ADL training;Therapeutic exercise;Patient/family education;Neuromuscular education;Moist Heat;Paraffin;Fluidtherapy;Therapist, nutritional;Therapeutic activities;Cognitive remediation/compensation;Passive range of motion;Manual Therapy;DME and/or AE instruction;Ultrasound;Cryotherapy    Plan  consider theraband HEP, simple cooking task or organizing your day task to further assess cognition in functional context    Consulted and Agree with Plan of Care  Patient;Family member/caregiver    Family Member Consulted  son [name]       Patient will benefit from skilled therapeutic intervention in order to improve the following deficits and impairments:     Visit Diagnosis: Muscle weakness (generalized)  Other lack of coordination  Frontal lobe and executive function deficit  Attention and concentration deficit    Problem List Patient Active Problem List   Diagnosis Date Noted  . Stroke ([place]) 01/21/2018  . Reactive hypertension   . Hypoalbuminemia due to protein-calorie malnutrition ([place])   . Seizures ([place])   . Elevated BUN   . Brain mass 01/06/2018  . Aphasia   . Abnormal CT of the head   . CNS mass 01/01/2018  . Acute metabolic encephalopathy 19/50/9326  . Headache 12/31/2017  . Hypertension 12/31/2017  . Hyperglycemia 12/31/2017  . Confusion 12/31/2017    Regina Ganci 01/21/2018, 12:48 PM  [facility] [address] [place], [place], [address] Phone: [phone]   Fax:  [phone]  Name: Tara [name] MRN: [id number] Date of Birth: 1951-01-11

## 2018-01-21 NOTE — Patient Instructions (Signed)
1. Grip Strengthening (Resistive Putty)   Squeeze putty using thumb and all fingers. Repeat _20___ times. Do __2__ sessions per day.   2. Roll putty into tube on table and pinch between each finger and thumb x 10 reps each. (can do ring and small finger together)     Coordination Activities  Perform the following activities for 20 minutes 1 times per day with right hand(s).   Rotate ball in fingertips (clockwise and counter-clockwise).  Toss ball between hands.  Toss ball in air and catch with the same hand.  Flip cards 1 at a time   Deal cards with your thumb (Hold deck in hand and push card off top with thumb).  Pick up coins and stack.  Pick up coins one at a time until you get 5-10 in your hand, then move coins from palm to fingertips to stack one at a time.  Practice writing and/or typing.

## 2018-01-24 ENCOUNTER — Telehealth: Payer: Self-pay | Admitting: Internal Medicine

## 2018-01-24 ENCOUNTER — Other Ambulatory Visit: Payer: Self-pay | Admitting: *Deleted

## 2018-01-24 ENCOUNTER — Telehealth: Payer: Self-pay

## 2018-01-24 DIAGNOSIS — I6389 Other cerebral infarction: Secondary | ICD-10-CM

## 2018-01-24 DIAGNOSIS — I639 Cerebral infarction, unspecified: Secondary | ICD-10-CM

## 2018-01-24 NOTE — Telephone Encounter (Signed)
Faxed records to [facility] Release ID# [id number].

## 2018-01-24 NOTE — Therapy (Signed)
[facility] [address] [place], [place], [address] Phone: [phone]   Fax:  [phone]  Speech Language Pathology Evaluation  Patient Details  Name: Tara [name] MRN: [id number] Date of Birth: 11/09/1950 Referring Provider: Dr. Alysia Penna   Encounter Date: 01/21/2018    History reviewed. No pertinent past medical history.  Past Surgical History:  Procedure Laterality Date  . APPLICATION OF CRANIAL NAVIGATION N/A 01/04/2018   Procedure: APPLICATION OF CRANIAL NAVIGATION;  Surgeon: Erline Levine, MD;  Location: [place];  Service: Neurosurgery;  Laterality: N/A;  . PR DURAL GRAFT REPAIR,SPINE DEFECT Left 01/04/2018   Procedure: Left Pterional craniotomy for biopsy with Brainlab;  Surgeon: Erline Levine, MD;  Location: [place];  Service: Neurosurgery;  Laterality: Left;  Left Pterional craniotomy for biopsy with Brainlab    There were no vitals filed for this visit.      SLP Evaluation [facility] - 01/24/18 0001      SLP Visit Information   SLP Received On  01/21/18    Referring Provider  Dr. Alysia Penna    Onset Date  12/31/2017    Medical Diagnosis  brain mass      Subjective   Patient/Family Stated Goal  "I want to take care of family and don't want to be a burden on anyone"      General Information   HPI  Patient is a 66 year old female with history of hypertension, presented to ED with confusion, difficulty finishing sentences since the night before the admission. She also complained of slight left frontal headache. Denied any fevers, chills or reported. CT showed large amount of white matter edema in the left basal ganglia, left temporal lobe concerning for underlying neoplasm, 9 mm of left-to-right midline shift. 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    Mobility Status  walks independently      Balance Screen   Has the patient fallen in the past 6 months  No    Has the patient had a decrease in activity level because of a fear of falling?   No    Is the patient reluctant to leave their home because of a fear of falling?   No      Prior Functional Status   Cognitive/Linguistic Baseline  Within functional limits    Type of Home  House     Lives With  Significant other    Available Support  Family    Education  MBA    Vocation  Retired      Associate Professor   Overall Cognitive Status  Impaired/Different from baseline    Area of Impairment  Memory;Attention;Problem solving    Current Attention Level  Alternating    Memory  Decreased short-term memory    Awareness  Emergent    Problem Solving  Impaired    Problem Solving Impairment  Functional complex;Verbal complex      Auditory Comprehension   Overall Auditory Comprehension  Appears within functional limits for tasks assessed    Yes/No Questions  Within Functional Limits    Commands  Within Functional Limits    Multistep Basic Commands  75-100% accurate    Conversation  Moderately complex    EffectiveTechniques  Extra processing time;Repetition      Reading Comprehension   Reading Status  Within funtional limits  Expression   Primary Mode of Expression  Verbal      Verbal Expression   Overall Verbal Expression  Impaired    Initiation  No impairment    Automatic Speech  Name;Social Response;Counting;Day of week;Month of year    Level of Generative/Spontaneous Verbalization  Conversation    Repetition  No impairment    Naming  Impairment    Responsive  76-100% accurate    Confrontation  75-100% accurate    Convergent  Not tested    Divergent  50-74% accurate    Verbal Errors  Semantic paraphasias    Pragmatics  No impairment    Effective Techniques  Written cues;Sentence completion      Written Expression   Dominant Hand   Right    Written Expression  Within Functional Limits      Oral Motor/Sensory Function   Overall Oral Motor/Sensory Function  Impaired    Labial ROM  Reduced right    Labial Symmetry  Within Functional Limits    Labial Sensation  Within Functional Limits    Labial Coordination  WFL    Lingual ROM  Within Functional Limits    Lingual Symmetry  Within Functional Limits    Lingual Strength  Within Functional Limits    Lingual Sensation  Within Functional Limits    Lingual Coordination  WFL    Facial ROM  Within Functional Limits      Motor Speech   Overall Motor Speech  Appears within functional limits for tasks assessed    Respiration  Within functional limits    Phonation  Normal    Resonance  Within functional limits    Articulation  Within functional limitis    Intelligibility  Intelligible    Motor Planning  Witnin functional limits      Standardized Assessments   Standardized Assessments   Cognitive Linguistic Quick Test;[place] Naming Test-2nd edition    Boston Naming Test-2nd edition   45/60 >1 SD below age related mean      Cognitive Linguistic Quick Test (Ages 72-69)   Attention  WNL    Memory  Mild    Executive Function  WNL    Language  Mild    Visuospatial Skills  WNL    Severity Rating Total  18    Composite Severity Rating  14.8                        SLP Short Term Goals - 01/24/18 1404      SLP SHORT TERM GOAL #1   Title  Pt will complete moderately complex naming tasks with occasional min A over 3 sessions.    Time  6    Period  Weeks    Status  New      SLP SHORT TERM GOAL #2   Title  Pt will utilize compensations for aphasia in structured naming tasks 4/5 opportunites with occasional min A over 2 sessions    Time  6    Period  Weeks    Status  New      SLP SHORT TERM GOAL #3   Title  Pt will utiltize compensations for aphasia over 5 minute conversation 3/5 opportunites with occasional min A oer 2 sessions    Time  6    Period   Weeks    Status  New      SLP SHORT TERM GOAL #4   Title  Written expression/reading comprehension assessment if indicated  Time  6    Period  Weeks    Status  New       SLP Long Term Goals - 01/24/18 1407      SLP LONG TERM GOAL #1   Title  Pt will complete complex naming tasks with rare min A over 3 sessions    Time  12    Period  Weeks    Status  New      SLP LONG TERM GOAL #2   Title  Pt will utilize compensations for aphasia over 15 minute moderately complex conversation with occasional min A over 2 sessions.    Time  12    Period  Weeks    Status  New       Plan - 01/24/18 1355    Clinical Impression Statement  Ms. Mattews, hospitalized 12/31/17 to 01/12/18 s/p left craniotomy brain mass, if referred for ongong aphasia and cognitive impairment. Today she presents with mild to moderate aphasia characterized by high level anomia affecting her ability to communicate moderately complex information. The BNT-2 revealed significant naming impairment greater than 1 SD below  age mean. The CLQT revealed mild memory impairment, however her difficulty was on story retell, likely affected by aphasia. Visual memory was 6/6. Pt demonstrates emergent  awareness. She did repeatedly expalin away her naming diffiuclties, stating several times to her daughter in law, "Your kids couldn't name these." Pt and daughter in law deny diffiiculty managing medications. Pt has completed some simple cooking tasks with success. Ms. Eisenhuth reports frustration when communicating and she experiences anomia. I recommend skilled ST to maximize verbal expression for improved independence and QOL.     Speech Therapy Frequency  2x / week    Duration  -- 12 weeks or total of 25 visits    Potential to Achieve Goals  Good    Consulted and Agree with Plan of Care  Patient;Family member/caregiver    Family Member Consulted  daugher in Set designer       Patient will benefit from skilled therapeutic intervention in  order to improve the following deficits and impairments:   Aphasia - Plan: SLP plan of care cert/re-cert    Problem List Patient Active Problem List   Diagnosis Date Noted  . Stroke ([place]) 01/21/2018  . Reactive hypertension   . Hypoalbuminemia due to protein-calorie malnutrition ([place])   . Seizures ([place])   . Elevated BUN   . Brain mass 01/06/2018  . Aphasia   . Abnormal CT of the head   . CNS mass 01/01/2018  . Acute metabolic encephalopathy 99/37/1696  . Headache 12/31/2017  . Hypertension 12/31/2017  . Hyperglycemia 12/31/2017  . Confusion 12/31/2017    Lilia Letterman, Annye Rusk 01/24/2018, 2:10 PM  [facility] [address] [place], [place], [address] Phone: [phone]   Fax:  [phone]  Name: Tara [name] MRN: [id number] Date of Birth: Oct 31, 1951

## 2018-01-24 NOTE — Telephone Encounter (Signed)
Called and scheduled echo for provider. He was unable to add to los after several attempts. Receive a call from [employer]. To schedule this appointment. Per 3/25 RN. request

## 2018-01-25 ENCOUNTER — Other Ambulatory Visit: Payer: Self-pay | Admitting: Internal Medicine

## 2018-01-25 ENCOUNTER — Ambulatory Visit: Payer: Medicare Other | Admitting: Occupational Therapy

## 2018-01-25 DIAGNOSIS — G9689 Other specified disorders of central nervous system: Secondary | ICD-10-CM

## 2018-01-25 DIAGNOSIS — G968 Other specified disorders of central nervous system: Principal | ICD-10-CM

## 2018-01-25 NOTE — Progress Notes (Signed)
a 

## 2018-01-26 ENCOUNTER — Telehealth: Payer: Self-pay | Admitting: *Deleted

## 2018-01-26 NOTE — Telephone Encounter (Signed)
Faxed ROI to [facility], attn: Alma Friendly; release id # [id number]

## 2018-01-27 ENCOUNTER — Ambulatory Visit (HOSPITAL_BASED_OUTPATIENT_CLINIC_OR_DEPARTMENT_OTHER): Payer: Medicare Other | Admitting: Physical Medicine & Rehabilitation

## 2018-01-27 ENCOUNTER — Encounter: Payer: Self-pay | Admitting: Physical Medicine & Rehabilitation

## 2018-01-27 ENCOUNTER — Encounter: Payer: Medicare Other | Attending: Physical Medicine & Rehabilitation

## 2018-01-27 ENCOUNTER — Telehealth: Payer: Self-pay | Admitting: *Deleted

## 2018-01-27 VITALS — BP 147/88 | HR 70 | Resp 14 | Ht 66.0 in | Wt 220.0 lb

## 2018-01-27 DIAGNOSIS — Z9889 Other specified postprocedural states: Secondary | ICD-10-CM | POA: Diagnosis not present

## 2018-01-27 DIAGNOSIS — R4701 Aphasia: Secondary | ICD-10-CM | POA: Diagnosis not present

## 2018-01-27 DIAGNOSIS — G8191 Hemiplegia, unspecified affecting right dominant side: Secondary | ICD-10-CM | POA: Diagnosis not present

## 2018-01-27 DIAGNOSIS — G939 Disorder of brain, unspecified: Secondary | ICD-10-CM | POA: Diagnosis not present

## 2018-01-27 DIAGNOSIS — R269 Unspecified abnormalities of gait and mobility: Secondary | ICD-10-CM

## 2018-01-27 DIAGNOSIS — I69398 Other sequelae of cerebral infarction: Secondary | ICD-10-CM

## 2018-01-27 DIAGNOSIS — I69331 Monoplegia of upper limb following cerebral infarction affecting right dominant side: Secondary | ICD-10-CM | POA: Insufficient documentation

## 2018-01-27 DIAGNOSIS — I639 Cerebral infarction, unspecified: Secondary | ICD-10-CM | POA: Diagnosis not present

## 2018-01-27 DIAGNOSIS — F17210 Nicotine dependence, cigarettes, uncomplicated: Secondary | ICD-10-CM | POA: Diagnosis not present

## 2018-01-27 NOTE — Telephone Encounter (Signed)
Son requested that we process outside referral for [facility] at the request of the family.  Patient is related to [employer] at [employer].  Son [name] requests second opinion directly with Dr Maurie Boettcher Chief of Neurosurgery.  Referral form faxed to [phone] along with pertinent information along with copies and disc mailed to [facility].  Family notified once it was completed.

## 2018-01-27 NOTE — Progress Notes (Signed)
Subjective:    Patient ID: Tara [name], female    DOB: [birth year], 67 y.o.   MRN: [id number] 67 year old right-handed female with a history of tobacco abuse and no prescription medications, who lives with spouse, independent prior to admission.  She presented on December 31, 2017, with confusion, slurred speech, left frontal headache, and right-sided weakness.  CT MRI demonstrated an enhancing lesion surrounding T2 signal, consistent with a likely mass.  CT of the chest with no finding suspicious for malignancy.  CT abdomen and pelvis is unremarkable.  Noted seizure on January 02, 2018; loaded with Keppra; EEG with no seizure activity.  Followup MRI, January 03, 2018, showed a new area of acute infarction in the left corona radiata and external capsule; a large area of edema, left cerebral white matter, similar to recent MRI; and a 4 mm shift to the right, unchanged.  She underwent a left craniotomy as well as biopsy for a brain mass on January 04, 2018, per Dr. Vertell Limber.  Medical Oncology consulted.  Preliminary frozen pathology report was nondiagnostic, awaiting the formal pathology report.  Patient followup outpatient   HPI Seeing Dr Mickeal Skinner, reviewed appointment note from 01/20/2018.  Pathology results are listed as negative for carcinoma however second opinion is being sought at [facility]. No seizures at home Patient has been dressing and bathing herself.  She has mild difficulty with buttoning using the right hand but otherwise doing well with these tasks.  She ambulates without assistive device. Patient has been attending outpatient PT OT speech has scheduled appointments through May  Patient is not driving she is accompanied by her husband and her son.  They note some memory deficits as well as problems with her speech. Pain Inventory Average Pain 0 Pain Right Now 0 My pain is no pain  In the last 24 hours, has pain interfered with the following? General activity  0 Relation with others 0 Enjoyment of life 0 What TIME of day is your pain at its worst? no pain Sleep (in general) Poor  Pain is worse with: no pain Pain improves with: no pain Relief from Meds: no pain  Mobility walk without assistance ability to climb steps?  yes do you drive?  no  Function not employed: date last employed .  Neuro/Psych No problems in this area  Prior Studies Any changes since last visit?  no  Physicians involved in your care Any changes since last visit?  no   Family History  Problem Relation Age of Onset  . Colon cancer Mother   . CAD Father    Social History   Socioeconomic History  . Marital status: Married    Spouse name: Not on file  . Number of children: Not on file  . Years of education: Not on file  . Highest education level: Not on file  Occupational History  . Not on file  Social Needs  . Financial resource strain: Not on file  . Food insecurity:    Worry: Not on file    Inability: Not on file  . Transportation needs:    Medical: Not on file    Non-medical: Not on file  Tobacco Use  . Smoking status: Current Every Day Smoker    Packs/day: 1.00    Types: Cigarettes  . Smokeless tobacco: Never Used  . Tobacco comment: plans to quit now  Substance and Sexual Activity  . Alcohol use: No    Frequency: Never  . Drug use: No  .  Sexual activity: Yes  Lifestyle  . Physical activity:    Days per week: Not on file    Minutes per session: Not on file  . Stress: Not on file  Relationships  . Social connections:    Talks on phone: Not on file    Gets together: Not on file    Attends religious service: Not on file    Active member of club or organization: Not on file    Attends meetings of clubs or organizations: Not on file    Relationship status: Not on file  Other Topics Concern  . Not on file  Social History Narrative  . Not on file   Past Surgical History:  Procedure Laterality Date  . APPLICATION OF CRANIAL  NAVIGATION N/A 01/04/2018   Procedure: APPLICATION OF CRANIAL NAVIGATION;  Surgeon: Erline Levine, MD;  Location: [place];  Service: Neurosurgery;  Laterality: N/A;  . PR DURAL GRAFT REPAIR,SPINE DEFECT Left 01/04/2018   Procedure: Left Pterional craniotomy for biopsy with Brainlab;  Surgeon: Erline Levine, MD;  Location: [place];  Service: Neurosurgery;  Laterality: Left;  Left Pterional craniotomy for biopsy with Brainlab   History reviewed. No pertinent past medical history. BP (!) 147/88   Pulse 70   Resp 14   Ht 5\' 6"  (1.676 m)   Wt 220 lb (99.8 kg)   SpO2 95%   BMI 35.51 kg/m   Opioid Risk Score:   Fall Risk Score:  `1  Depression screen PHQ 2/9  No flowsheet data found.  Review of Systems  Constitutional: Negative.   HENT: Negative.   Eyes: Negative.   Respiratory: Negative.   Cardiovascular: Negative.   Gastrointestinal: Negative.   Endocrine: Negative.   Genitourinary: Negative.   Musculoskeletal: Negative.   Skin: Negative.   Allergic/Immunologic: Negative.   Neurological: Negative.   Hematological: Negative.   Psychiatric/Behavioral: Negative.   All other systems reviewed and are negative.      Objective:   Physical Exam  Constitutional: She is oriented to person, place, and time. She appears well-developed and well-nourished. No distress.  HENT:  Head: Normocephalic and atraumatic.  Eyes: Pupils are equal, round, and reactive to light. Conjunctivae and EOM are normal.  Neck: Normal range of motion.  Cardiovascular: Normal rate and regular rhythm.  Pulmonary/Chest: Breath sounds normal. No respiratory distress. She has no wheezes.  Abdominal: Soft. Bowel sounds are normal. She exhibits no distension. There is no tenderness.  Neurological: She is alert and oriented to person, place, and time.  Motor strength is 4+ in the right deltoid bicep tricep grip 5/5 in the left deltoid bicep tricep grip 5/5 bilateral hip flexor knee extensor ankle dorsiflexor There is  mild dysdiadochokinesis with rapid alternating supination pronation of the right upper extremity Patient has intact finger to thumb opposition bilaterally. Sensation is reported as normal to pinprick and light touch bilateral upper extremities There is no evidence of facial droop She ambulates without assistive device no evidence of toe drag or knee instability. She is able to Toe Walk and Heel Walk but unable to perform tandem gait she tends to fall toward the right side.  Skin: She is not diaphoretic.  Psychiatric: She has a normal mood and affect.  Nursing note and vitals reviewed.         Assessment & Plan:  1.  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  Patient has been sneaking cigarettes according to family.  We discussed that stroke risk is higher for smokers by  at least 50%. She will continue outpatient PT OT and speech therapy.  We went over the timeframe of recovery from stroke with expected physical plateau by 6 months and expected cognitive plateau by 12 months.  Physical medicine rehabilitation follow-up in 6 weeks  2.  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  She follows up with medical neuro oncology as well as having consultations, second opinions at [facility]

## 2018-01-28 ENCOUNTER — Ambulatory Visit: Payer: Medicare Other | Admitting: Occupational Therapy

## 2018-01-28 ENCOUNTER — Ambulatory Visit: Payer: Medicare Other

## 2018-01-28 ENCOUNTER — Encounter: Payer: Self-pay | Admitting: Occupational Therapy

## 2018-01-28 VITALS — BP 133/89

## 2018-01-28 DIAGNOSIS — R2681 Unsteadiness on feet: Secondary | ICD-10-CM | POA: Diagnosis not present

## 2018-01-28 DIAGNOSIS — R41840 Attention and concentration deficit: Secondary | ICD-10-CM

## 2018-01-28 DIAGNOSIS — R4701 Aphasia: Secondary | ICD-10-CM

## 2018-01-28 DIAGNOSIS — M6281 Muscle weakness (generalized): Secondary | ICD-10-CM

## 2018-01-28 DIAGNOSIS — R41844 Frontal lobe and executive function deficit: Secondary | ICD-10-CM

## 2018-01-28 DIAGNOSIS — R262 Difficulty in walking, not elsewhere classified: Secondary | ICD-10-CM | POA: Diagnosis not present

## 2018-01-28 DIAGNOSIS — R278 Other lack of coordination: Secondary | ICD-10-CM | POA: Diagnosis not present

## 2018-01-28 DIAGNOSIS — R41841 Cognitive communication deficit: Secondary | ICD-10-CM

## 2018-01-28 DIAGNOSIS — R293 Abnormal posture: Secondary | ICD-10-CM | POA: Diagnosis not present

## 2018-01-28 NOTE — Therapy (Signed)
[facility] [address] [place], [place], [address] Phone: [phone]   Fax:  [phone]  Occupational Therapy Treatment  Patient Details  Name: Tara [name] MRN: [id number] Date of Birth: 29-Jun-1951 Referring Provider: Alysia Penna   Encounter Date: 01/28/2018  OT End of Session - 01/28/18 1434    Visit Number  3    Number of Visits  25    Date for OT Re-Evaluation  04/19/18    Authorization Type  Medicare    Authorization Time Period  goals written for 12 weeks, may d/c after 8 weeks depending on progress    OT Start Time  1025 pt late    OT Stop Time  1100    OT Time Calculation (min)  35 min    Activity Tolerance  Patient tolerated treatment well    Behavior During Therapy  [facility] for tasks assessed/performed       No past medical history on file.  Past Surgical History:  Procedure Laterality Date  . APPLICATION OF CRANIAL NAVIGATION N/A 01/04/2018   Procedure: APPLICATION OF CRANIAL NAVIGATION;  Surgeon: Erline Levine, MD;  Location: [place];  Service: Neurosurgery;  Laterality: N/A;  . PR DURAL GRAFT REPAIR,SPINE DEFECT Left 01/04/2018   Procedure: Left Pterional craniotomy for biopsy with Brainlab;  Surgeon: Erline Levine, MD;  Location: [place];  Service: Neurosurgery;  Laterality: Left;  Left Pterional craniotomy for biopsy with Brainlab    Vitals:   01/28/18 1057  BP: 133/89    Subjective Assessment - 01/28/18 1437    Subjective   Pt denies pain    Pertinent History   seizure 01/02/2018 ,craniotomy as well as biopsy for brain mass 01/04/2018 per Dr. Vertell Limber, CVA    Patient Stated Goals  to get back my independence    Currently in Pain?  No/denies           Treatment: fine motor coordination task to place grooved pegs into pegboard with RUE, and remove with in hand manipulation, pt  dmeonstrates improved RUE functional use. Arm bike x 5 mins level 3 for  conditioning.                   OT Short Term Goals - 01/19/18 1302      OT SHORT TERM GOAL #1   Title  I with HEP    Time  4    Period  Weeks    Status  New    Target Date  02/18/18      OT SHORT TERM GOAL #2   Title  Pt will perform basic home management/ cooking with distant supervision demonstrating good safety awareness.    Time  4    Period  Weeks    Status  New      OT SHORT TERM GOAL #3   Title  Pt will demonstrate improved fine motor coordination for ADLs as evidenced by decreasing 9 hole peg test score by 3 secs for RUE.    Time  4    Period  Weeks    Status  New      OT SHORT TERM GOAL #4   Title  Pt will perform a basic functional organization task with no more than 2 v.c    Time  4    Period  Weeks      OT SHORT TERM GOAL #5   Title  Pt will demonstrate ability to alternate attention between 2 functional tasks with 80% or  better accuracy.    Time  4    Period  Weeks    Status  New        OT Long Term Goals - 01/19/18 1305      OT LONG TERM GOAL #1   Title  I with updated HEP.    Time  12    Period  Weeks    Status  New    Target Date  04/19/18      OT LONG TERM GOAL #2   Title  Pt will perform mod complex home management and cooking at a modified independent level demonstrating good safety awareness.    Time  12    Period  Weeks    Status  New      OT LONG TERM GOAL #3   Title   Pt will perform a moderate complex organization task independently    Time  12    Period  Weeks    Status  New      OT LONG TERM GOAL #4   Title  Pt will demonstrate ability to divide attention between a physical and cognitive task with 90% or better accuracy.    Time  12    Status  New            Plan - 01/28/18 1435    Clinical Impression Statement  Pt is progressing towards goals. She demonstrates understanding of red theraband HEP.    Rehab Potential  Good    Current Impairments/barriers affecting progress:  decreased awareness of  deficits, await results of biopsy    OT Frequency  2x / week    OT Duration  12 weeks    OT Treatment/Interventions  Self-care/ADL training;Therapeutic exercise;Patient/family education;Neuromuscular education;Moist Heat;Paraffin;Fluidtherapy;Therapist, nutritional;Therapeutic activities;Cognitive remediation/compensation;Passive range of motion;Manual Therapy;DME and/or AE instruction;Ultrasound;Cryotherapy    Plan  simple cooking task to look at safety in a functional context and RUE use    Consulted and Agree with Plan of Care  Patient;Family member/caregiver    Family Member Consulted  son [name]       Patient will benefit from skilled therapeutic intervention in order to improve the following deficits and impairments:  Abnormal gait, Decreased cognition, Impaired vision/preception, Decreased mobility, Decreased coordination, Decreased activity tolerance, Decreased endurance, Decreased range of motion, Decreased strength, Impaired UE functional use, Difficulty walking, Impaired perceived functional ability, Decreased safety awareness, Decreased knowledge of precautions, Decreased balance  Visit Diagnosis: Muscle weakness (generalized)  Other lack of coordination  Frontal lobe and executive function deficit  Attention and concentration deficit    Problem List Patient Active Problem List   Diagnosis Date Noted  . 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 Stroke ([place]) 01/21/2018  . Reactive hypertension   . Hypoalbuminemia due to protein-calorie malnutrition ([place])   . Seizures ([place])   . Elevated BUN   . Brain mass 01/06/2018  . Aphasia   . Abnormal CT of the head   . CNS mass 01/01/2018  . Acute metabolic encephalopathy 32/67/1245  . Headache 12/31/2017  . Hypertension 12/31/2017  . Hyperglycemia 12/31/2017  . Confusion 12/31/2017    RINE,KATHRYN 01/28/2018, 2:37 PM  [facility] [address] [place], [place], [address] Phone: [phone]   Fax:  [phone]  Name: CREE NAPOLI MRN: [id number] Date of Birth: [birth year]

## 2018-01-28 NOTE — Patient Instructions (Signed)
  Strengthening: Resisted Flexion   Hold tubing with __right___ arm(s) at side. Pull forward and up. Move shoulder through pain-free range of motion. Repeat __10__ times per set.  Do _1-2_ sessions per day , every other day   Strengthening: Resisted Extension   Hold tubing in __right___ hand(s), arm forward. Pull arm back, elbow straight. Repeat _10___ times per set. Do _1-2___ sessions per day, every other day.   Resisted Horizontal Abduction: Bilateral   Sit or stand, tubing in both hands, arms out in front. Keeping arms straight, pinch shoulder blades together and stretch arms out. Repeat _10___ times per set. Do _1-2___ sessions per day, every other day.   Elbow Flexion: Resisted   With tubing held in __right____ hand(s) and other end secured under foot, curl arm up as far as possible. Repeat _10___ times per set. Do _1-2___ sessions per day, every other day.    Elbow Extension: Resisted   Sit in chair with resistive band secured at armrest (or hold with other hand) and __right_____ elbow bent. Straighten elbow. Repeat _10___ times per set.  Do _1-2___ sessions per day, every other day.   Copyright  VHI. All rights reserved.

## 2018-01-28 NOTE — Therapy (Signed)
[name] [address] [place], [place], [address] Phone: [phone]   Fax:  [phone]  Speech Language Pathology Treatment  Patient Details  Name: Tara [name] MRN: [id number] Date of Birth: 1951/01/03 Referring Provider: Dr. Alysia Penna   Encounter Date: 01/28/2018  End of Session - 01/28/18 1441    Visit Number  2    Number of Visits  17    Date for SLP Re-Evaluation  03/18/18    SLP Start Time  6226    SLP Stop Time   1235    SLP Time Calculation (min)  42 min    Activity Tolerance  Patient tolerated treatment well       History reviewed. No pertinent past medical history.  Past Surgical History:  Procedure Laterality Date  . APPLICATION OF CRANIAL NAVIGATION N/A 01/04/2018   Procedure: APPLICATION OF CRANIAL NAVIGATION;  Surgeon: Erline Levine, MD;  Location: [place];  Service: Neurosurgery;  Laterality: N/A;  . PR DURAL GRAFT REPAIR,SPINE DEFECT Left 01/04/2018   Procedure: Left Pterional craniotomy for biopsy with Brainlab;  Surgeon: Erline Levine, MD;  Location: [place];  Service: Neurosurgery;  Laterality: Left;  Left Pterional craniotomy for biopsy with Brainlab    There were no vitals filed for this visit.  Subjective Assessment - 01/28/18 1159    Subjective  "THe words are coming easier now."    Patient is accompained by:  Family member son    Currently in Pain?  No/denies            ADULT SLP TREATMENT - 01/28/18 1202      General Information   Behavior/Cognition  Alert;Cooperative;Pleasant mood      Treatment Provided   Treatment provided  Cognitive-Linquistic      Cognitive-Linquistic Treatment   Treatment focused on  Aphasia    Skilled Treatment  Pt arrives today with [facility] simple and mod-complex speech. SLP informally assessed pt's expressive language in conversation and found to be certainly functional/borderline WNL. Pt had difficulty generating "[facility]" but  thought of it approx 2 minutes later. SLP used this opportunity to educate pt on compensatory strategies for aphasia. SLP noted pt's cognitive-linguistic deficits in (at least) memory and attention. SLP suggests pt have goal/s for these deficits. Pt with WNL writing at sentence level - no aphasic errors noted.       Assessment / Recommendations / Plan   Plan  Goals updated for memory;consider further cogn-ling testing/re-testing      Progression Toward Goals   Progression toward goals  Goals met and updated       SLP Education - 01/28/18 1440    Education provided  Yes    Education Details  compensatory strategies for aphasia    Person(s) Educated  Patient;Child(ren)    Methods  Explanation;Demonstration;Verbal cues    Comprehension  Verbalized understanding;Need further instruction;Returned demonstration;Verbal cues required       SLP Short Term Goals - 01/28/18 1732      SLP SHORT TERM GOAL #1   Title  Pt will complete moderately complex naming tasks with occasional min A over 3 sessions.    Status  Deferred WNL at mod complex level      SLP SHORT TERM GOAL #2   Title  Pt will utilize compensations for aphasia in structured naming tasks 4/5 opportunites with occasional min A over 2 sessions    Status  Deferred WNL at mod complex level      SLP SHORT  TERM GOAL #3   Title  Pt will utiltize compensations for aphasia over 10 minute modconversation 3/5 opportunites with occasional min A oer 2 sessions    Status  Deferred      SLP SHORT TERM GOAL #4   Title  Written expression/reading comprehension assessment if indicated    Status  Achieved      SLP SHORT TERM GOAL #5   Title  pt will tell SLP/indicate 4 memory strategies she could use to improve memory over two sessions    Time  4    Period  Weeks    Status  New       SLP Long Term Goals - 01/28/18 1734      SLP LONG TERM GOAL #1   Title  Pt will complete complex naming tasks with rare min A over 3 sessions    Status   Deferred      SLP LONG TERM GOAL #2   Title  Pt will utilize compensations for aphasia over 15 minute complex conversation with rare min A over 2 sessions.    Time  12    Period  Weeks    Status  On-going      SLP LONG TERM GOAL #3   Title  pt will utilize at least one memory strategy in 5 therapy sessions    Time  [place] - 01/28/18 1441    Clinical Impression Statement  Today pt presents with WFL/WNL language ability with moderately complex information. Although pt and daughter in law denied diffiiculty managing medications, based upon observations during today's session a goal has been made for memory and furhter cognitive linguistic testing hsould be considered. Cognitive communication deficit has been added to pt's ST diagnoses. Aphasia long term goals have been modified for just compensatory measures in complex conversation.  I recommend skilled ST to maximize compensations for mild verbal expression deficits and for improved cognitive linguistic skills to promote incr'd independence and QOL.     Speech Therapy Frequency  2x / week    Duration  -- 12 weeks or total of 25 visits    Treatment/Interventions  Cognitive reorganization;Functional tasks;Compensatory strategies;SLP instruction and feedback;Patient/family education;Internal/external aids;Environmental controls;Cueing hierarchy    Potential to Achieve Goals  Good    Consulted and Agree with Plan of Care  Patient;Family member/caregiver    Family Member Consulted  daugher in Set designer       Patient will benefit from skilled therapeutic intervention in order to improve the following deficits and impairments:   Aphasia  Cognitive communication disorder    Problem List Patient Active Problem List   Diagnosis Date Noted  . 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 Stroke ([place]) 01/21/2018   . Reactive hypertension   . Hypoalbuminemia due to protein-calorie malnutrition ([place])   . Seizures ([place])   . Elevated BUN   . Brain mass 01/06/2018  . Aphasia   . Abnormal CT of the head   . CNS mass 01/01/2018  . Acute metabolic encephalopathy 29/51/8841  . Headache 12/31/2017  . Hypertension 12/31/2017  . Hyperglycemia 12/31/2017  . Confusion 12/31/2017    [facility] ,[place], CCC-SLP  01/28/2018, 5:39 PM  [facility] [address] [place], [place], [address] Phone: [phone]   Fax:  [phone]   Name:  AAMNA MALLOZZI MRN: [id number] Date of Birth: 11/08/1950

## 2018-01-28 NOTE — Patient Instructions (Signed)
  Please complete the assigned speech therapy homework prior to your next session and return it to the speech therapist at your next visit.  

## 2018-01-31 ENCOUNTER — Ambulatory Visit: Payer: Medicare Other | Attending: Physical Medicine & Rehabilitation | Admitting: Physical Therapy

## 2018-01-31 ENCOUNTER — Ambulatory Visit: Payer: Medicare Other | Admitting: Occupational Therapy

## 2018-01-31 ENCOUNTER — Encounter: Payer: Self-pay | Admitting: Physical Therapy

## 2018-01-31 VITALS — BP 142/82 | HR 82

## 2018-01-31 DIAGNOSIS — M6281 Muscle weakness (generalized): Secondary | ICD-10-CM | POA: Insufficient documentation

## 2018-01-31 DIAGNOSIS — R262 Difficulty in walking, not elsewhere classified: Secondary | ICD-10-CM | POA: Diagnosis not present

## 2018-01-31 DIAGNOSIS — R41841 Cognitive communication deficit: Secondary | ICD-10-CM | POA: Insufficient documentation

## 2018-01-31 DIAGNOSIS — R41840 Attention and concentration deficit: Secondary | ICD-10-CM | POA: Diagnosis not present

## 2018-01-31 DIAGNOSIS — R4701 Aphasia: Secondary | ICD-10-CM | POA: Diagnosis not present

## 2018-01-31 DIAGNOSIS — R293 Abnormal posture: Secondary | ICD-10-CM | POA: Insufficient documentation

## 2018-01-31 DIAGNOSIS — R2681 Unsteadiness on feet: Secondary | ICD-10-CM | POA: Diagnosis not present

## 2018-01-31 DIAGNOSIS — R278 Other lack of coordination: Secondary | ICD-10-CM | POA: Diagnosis not present

## 2018-01-31 DIAGNOSIS — R41844 Frontal lobe and executive function deficit: Secondary | ICD-10-CM | POA: Diagnosis not present

## 2018-01-31 NOTE — Therapy (Signed)
[facility] [address] [place], [place], [address] Phone: [phone]   Fax:  [phone]  Occupational Therapy Treatment  Patient Details  Name: Tara [name] MRN: [id number] Date of Birth: 1951/07/24 Referring Provider: Alysia Penna   Encounter Date: 01/31/2018  OT End of Session - 01/31/18 1051    Visit Number  4    Number of Visits  25    Date for OT Re-Evaluation  04/19/18    Authorization Type  Medicare    Authorization Time Period  goals written for 12 weeks, may d/c after 8 weeks depending on progress    OT Start Time  1020    OT Stop Time  1100    OT Time Calculation (min)  40 min    Activity Tolerance  Patient tolerated treatment well    Behavior During Therapy  [facility] for tasks assessed/performed       No past medical history on file.  Past Surgical History:  Procedure Laterality Date  . APPLICATION OF CRANIAL NAVIGATION N/A 01/04/2018   Procedure: APPLICATION OF CRANIAL NAVIGATION;  Surgeon: Erline Levine, MD;  Location: [facility];  Service: Neurosurgery;  Laterality: N/A;  . PR DURAL GRAFT REPAIR,SPINE DEFECT Left 01/04/2018   Procedure: Left Pterional craniotomy for biopsy with Brainlab;  Surgeon: Erline Levine, MD;  Location: [facility];  Service: Neurosurgery;  Laterality: Left;  Left Pterional craniotomy for biopsy with Brainlab    There were no vitals filed for this visit.  Subjective Assessment - 01/31/18 1022    Subjective   Pt reports no falls and no pain    Pertinent History   seizure 01/02/2018 ,craniotomy as well as biopsy for brain mass 01/04/2018 per Dr. Vertell Limber, CVA    Patient Stated Goals  to get back my independence    Currently in Pain?  No/denies            Treatment: Basic cooking task with pt locating all items without v.c Pt had difficulty cracking egg, likely due to decreased  RUE function/ dexterity.  Pt forgot to turn off stove and she attempted to wipe out hot pan with  paper towel. Therapist stopped pt before she performed due to risk for injury. Pt turned off stove following v.c from her dtr in law. Therapist recommends supervision with cooking at home and use of timer to remind her to turn off stove. Purdue pegboard for RUE fine motor coordination/ in hand manipulation, min v.c Gripper set at level 1 to pick up and stack 1 inch blocks, for increased sustained grip, min v.c                OT Education - 01/31/18 1424    Education provided  Yes    Education Details  safety for cooking, need for supervision    Person(s) Educated  Patient;Caregiver(s) dtr in Sports coach   dtr in Sports coach   Methods  Explanation;Demonstration;Verbal cues    Comprehension  Verbalized understanding;Returned demonstration       OT Short Term Goals - 01/19/18 1302      OT SHORT TERM GOAL #1   Title  I with HEP    Time  4    Period  Weeks    Status  New    Target Date  02/18/18      OT SHORT TERM GOAL #2   Title  Pt will perform basic home management/ cooking with distant supervision demonstrating good safety awareness.    Time  4  Period  Weeks    Status  New      OT SHORT TERM GOAL #3   Title  Pt will demonstrate improved fine motor coordination for ADLs as evidenced by decreasing 9 hole peg test score by 3 secs for RUE.    Time  4    Period  Weeks    Status  New      OT SHORT TERM GOAL #4   Title  Pt will perform a basic functional organization task with no more than 2 v.c    Time  4    Period  Weeks      OT SHORT TERM GOAL #5   Title  Pt will demonstrate ability to alternate attention between 2 functional tasks with 80% or better accuracy.    Time  4    Period  Weeks    Status  New        OT Long Term Goals - 01/19/18 1305      OT LONG TERM GOAL #1   Title  I with updated HEP.    Time  12    Period  Weeks    Status  New    Target Date  04/19/18      OT LONG TERM GOAL #2   Title  Pt will perform mod complex home management and cooking at a  modified independent level demonstrating good safety awareness.    Time  12    Period  Weeks    Status  New      OT LONG TERM GOAL #3   Title   Pt will perform a moderate complex organization task independently    Time  12    Period  Weeks    Status  New      OT LONG TERM GOAL #4   Title  Pt will demonstrate ability to divide attention between a physical and cognitive task with 90% or better accuracy.    Time  12    Status  New            Plan - 01/31/18 1425    Clinical Impression Statement  Pt is progressing towards goals. Therapist recommended pt has supervision with cooking as pt forgot to trun off stove today, and only did so following v.c    Rehab Potential  Good    OT Frequency  2x / week    OT Duration  12 weeks    OT Treatment/Interventions  Self-care/ADL training;Therapeutic exercise;Patient/family education;Neuromuscular education;Moist Heat;Paraffin;Fluidtherapy;Therapist, nutritional;Therapeutic activities;Cognitive remediation/compensation;Passive range of motion;Manual Therapy;DME and/or AE instruction;Ultrasound;Cryotherapy    Plan  continue RUE functional use and strength    Consulted and Agree with Plan of Care  Patient;Family member/caregiver    Family Member Consulted  dtr in law       Patient will benefit from skilled therapeutic intervention in order to improve the following deficits and impairments:  Abnormal gait, Decreased cognition, Impaired vision/preception, Decreased mobility, Decreased coordination, Decreased activity tolerance, Decreased endurance, Decreased range of motion, Decreased strength, Impaired UE functional use, Difficulty walking, Impaired perceived functional ability, Decreased safety awareness, Decreased knowledge of precautions, Decreased balance  Visit Diagnosis: Muscle weakness (generalized)  Other lack of coordination  Frontal lobe and executive function deficit  Attention and concentration deficit    Problem  List Patient Active Problem List   Diagnosis Date Noted  . 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 Stroke [facility])  01/21/2018  . Reactive hypertension   . Hypoalbuminemia due to protein-calorie malnutrition ([place])   . Seizures ([place])   . Elevated BUN   . Brain mass 01/06/2018  . Aphasia   . Abnormal CT of the head   . CNS mass 01/01/2018  . Acute metabolic encephalopathy 37/36/6815  . Headache 12/31/2017  . Hypertension 12/31/2017  . Hyperglycemia 12/31/2017  . Confusion 12/31/2017    Krystin Keeven 01/31/2018, 2:26 PM  [facility] [address] [place], [place], [address] Phone: [phone]   Fax:  [phone]  Name: SHONTA BOURQUE MRN: [id number] Date of Birth: 06/15/1951

## 2018-01-31 NOTE — Therapy (Signed)
[facility] [address] [place], [place], [address] Phone: [phone]   Fax:  [phone]  Physical Therapy Treatment  Patient Details  Name: Tara [name] MRN: [id number] Date of Birth: 04-06-1951 Referring Provider: Alysia Penna   Encounter Date: 01/31/2018  PT End of Session - 01/31/18 1924    Visit Number  2    Number of Visits  13    Date for PT Re-Evaluation  03/20/18    Authorization Type  MCR    Authorization Time Period  01/19/18 to 03/20/18    PT Start Time  0932    PT Stop Time  1015    PT Time Calculation (min)  43 min    Activity Tolerance  Patient tolerated treatment well    Behavior During Therapy  [facility] for tasks assessed/performed       History reviewed. No pertinent past medical history.  Past Surgical History:  Procedure Laterality Date  . APPLICATION OF CRANIAL NAVIGATION N/A 01/04/2018   Procedure: APPLICATION OF CRANIAL NAVIGATION;  Surgeon: Erline Levine, MD;  Location: [facility];  Service: Neurosurgery;  Laterality: N/A;  . PR DURAL GRAFT REPAIR,SPINE DEFECT Left 01/04/2018   Procedure: Left Pterional craniotomy for biopsy with Brainlab;  Surgeon: Erline Levine, MD;  Location: [facility];  Service: Neurosurgery;  Laterality: Left;  Left Pterional craniotomy for biopsy with Brainlab    Vitals:   01/31/18 0944  BP: (!) 142/82  Pulse: 82    Subjective Assessment - 01/31/18 0935    Subjective  My legs have gotten swollen and I don't know why (observed pitting edema rt lower leg>left). Denies sitting with feet down or static standing more. Has been walking more.      Patient is accompained by:  Family member    Pertinent History  seixure d/o, HTN    Patient Stated Goals  Be able to help my daughter with her baby ( due in one month, [place], [place])    Currently in Pain?  No/denies         [facility] PT Assessment - 01/31/18 0001      6 Minute Walk- Baseline   6 Minute Walk- Baseline  yes    BP (mmHg)  142/82    HR (bpm)  82 regular    02 Sat (%RA)  97 %    Modified Borg Scale for Dyspnea  0- Nothing at all    Perceived Rate of Exertion (Borg)  9- very light      6 Minute walk- Post Test   6 Minute Walk Post Test  yes    BP (mmHg)  146/84    HR (bpm)  84 irregular    02 Sat (%RA)  98 %    Modified Borg Scale for Dyspnea  2- Mild shortness of breath    Perceived Rate of Exertion (Borg)  12-      6 minute walk test results    Aerobic Endurance Distance Walked  1050    Endurance additional comments  normal for age 67 ft                           PT Education - 01/31/18 1922    Education provided  Yes    Education Details  results of Wells criteria for potential DVT (assessed due to RLE edema>LLE); presence of irregular heart rate after walking 6 minutes; need for a PCP and if not able to see one  this week she should seek assessment at an Urgent [place]; continue walking for exercise at slow enough pace to easily talk and monitor if HR becomes irregular    Person(s) Educated  Patient;Child(ren) daughter in law    Methods  Explanation    Comprehension  Verbalized understanding       PT Short Term Goals - 01/31/18 1939      PT SHORT TERM GOAL #1   Title  Complete 6 MWT. Update LTG if appropriate. (Target 02/04/18)    Baseline  01/31/18 met    Status  Achieved        PT Long Term Goals - 01/31/18 1940      PT LONG TERM GOAL #1   Title  Patient will be independent with HEP and able to verbalize a plan for continued community-based activity upon d/c from PT (Target for all LTGs 03/11/18--due to delayed start by 1.5 wks after evaluation based on census)      PT LONG TERM GOAL #2   Title  Patient will score >=22/30 on FGA to demonstrate improved balance and lesser fall risk.     Baseline  17/30    Time  6    Period  Weeks    Status  New      PT LONG TERM GOAL #3   Title  Patient will improve 6 MWT to 1250 ft, progressing to an appropriate  distance for her age to demonstrate improved gait, balance, and endurance.     Baseline  01/31/18 1050 ft (age based norm 1765 ft)    Time  6    Period  Weeks    Status  On-going      PT LONG TERM GOAL #4   Title  Patient will ambulate modified independently 200 ft over unlevel outdoor terrain while engaged in cognitive challenge/dual task activity.      Time  6    Period  Weeks    Status  New            Plan - 01/31/18 1925    Clinical Impression Statement  Patient returns after PT evaluation 01/10/18 (delay due to PT census and appointment availability). Patient presented with significant edema RLE (and much less edema LLE). Completed Wells criteria for DVT (=4-2=2 lower probability of DVT). Educated in elevating LEs higher than heart and to take note if 1) if makes her feel short of breath, 2) whether it reduces edema. Continued with 6 MWT with negative Wells criteria. All vital signs normal however noted pt with very irregular heart rate immediately after walk for up to 2 minutes and then returned to regular rhythm. Rate was 84 bpm. Discussed the irregular rhythm was also concerning and recommended patient contact her PCP. She does not have a PCP but received a name of someone that she planned to call and make an appointment with. Recommended if she could not get an appointment this week, or if she develops dyspnea at rest, she needs to be seen at an Urgent Care center vs ED. Daughter in law present and verbalized understanding.     Rehab Potential  Good    Clinical Impairments Affecting Rehab Potential  ? degree of cognitive deficits (she downplays and ?expressive difficulties make it difficult to discern cognition vs language deficit)    PT Frequency  2x / week    PT Duration  6 weeks    PT Treatment/Interventions  ADLs/Self Care Home Management;Gait training;Cognitive remediation;Neuromuscular re-education;Balance training;Therapeutic exercise;Therapeutic activities;Functional mobility  training;Stair training;Patient/family education;Passive range of motion    PT Next Visit Plan  review what HEP she is doing (from CIR) and update addressing balance deficits more than LE strength     Consulted and Agree with Plan of Care  Patient;Family member/caregiver    Family Member Consulted  daughter in law       Patient will benefit from skilled therapeutic intervention in order to improve the following deficits and impairments:  Decreased balance, Decreased cognition, Decreased strength, Decreased safety awareness, Difficulty walking, Postural dysfunction  Visit Diagnosis: Difficulty in walking, not elsewhere classified     Problem List Patient Active Problem List   Diagnosis Date Noted  . 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 Stroke ([place]) 01/21/2018  . Reactive hypertension   . Hypoalbuminemia due to protein-calorie malnutrition ([place])   . Seizures ([place])   . Elevated BUN   . Brain mass 01/06/2018  . Aphasia   . Abnormal CT of the head   . CNS mass 01/01/2018  . Acute metabolic encephalopathy 28/78/6767  . Headache 12/31/2017  . Hypertension 12/31/2017  . Hyperglycemia 12/31/2017  . Confusion 12/31/2017    Rexanne Mano, PT 01/31/2018, 7:43 PM  [facility] [address] [place], [place], [address] Phone: [phone]   Fax:  [phone]  Name: Tara [name] MRN: [id number] Date of Birth: 10-16-1951

## 2018-02-01 ENCOUNTER — Ambulatory Visit (HOSPITAL_COMMUNITY)
Admission: RE | Admit: 2018-02-01 | Discharge: 2018-02-01 | Disposition: A | Discharge status: Home / Self Care | Payer: Medicare Other | Source: Ambulatory Visit | Attending: Internal Medicine | Admitting: Internal Medicine

## 2018-02-01 DIAGNOSIS — I10 Essential (primary) hypertension: Secondary | ICD-10-CM | POA: Insufficient documentation

## 2018-02-01 DIAGNOSIS — I361 Nonrheumatic tricuspid (valve) insufficiency: Secondary | ICD-10-CM | POA: Diagnosis not present

## 2018-02-01 DIAGNOSIS — I6389 Other cerebral infarction: Secondary | ICD-10-CM | POA: Diagnosis not present

## 2018-02-01 NOTE — Progress Notes (Signed)
  Echocardiogram 2D Echocardiogram has been performed.  Tara [name] 02/01/2018, 10:59 AM

## 2018-02-03 ENCOUNTER — Ambulatory Visit (INDEPENDENT_AMBULATORY_CARE_PROVIDER_SITE_OTHER): Payer: Medicare Other | Admitting: Family Medicine

## 2018-02-03 ENCOUNTER — Encounter: Payer: Self-pay | Admitting: Family Medicine

## 2018-02-03 ENCOUNTER — Encounter: Payer: Self-pay | Admitting: Gastroenterology

## 2018-02-03 ENCOUNTER — Other Ambulatory Visit: Payer: Self-pay

## 2018-02-03 VITALS — BP 140/76 | HR 72 | Temp 98.6°F | Resp 18 | Ht 66.0 in | Wt 221.8 lb

## 2018-02-03 DIAGNOSIS — G9689 Other specified disorders of central nervous system: Secondary | ICD-10-CM

## 2018-02-03 DIAGNOSIS — B351 Tinea unguium: Secondary | ICD-10-CM | POA: Diagnosis not present

## 2018-02-03 DIAGNOSIS — R739 Hyperglycemia, unspecified: Secondary | ICD-10-CM

## 2018-02-03 DIAGNOSIS — I639 Cerebral infarction, unspecified: Secondary | ICD-10-CM

## 2018-02-03 DIAGNOSIS — G968 Other specified disorders of central nervous system: Secondary | ICD-10-CM

## 2018-02-03 DIAGNOSIS — Z1211 Encounter for screening for malignant neoplasm of colon: Secondary | ICD-10-CM

## 2018-02-03 DIAGNOSIS — R600 Localized edema: Secondary | ICD-10-CM

## 2018-02-03 DIAGNOSIS — I10 Essential (primary) hypertension: Secondary | ICD-10-CM | POA: Diagnosis not present

## 2018-02-03 DIAGNOSIS — G969 Disorder of central nervous system, unspecified: Secondary | ICD-10-CM

## 2018-02-03 LAB — LIPID PANEL
Chol/HDL Ratio: 2.4 ratio (ref 0.0–4.4)
Cholesterol, Total: 229 mg/dL — ABNORMAL HIGH (ref 100–199)
HDL: 95 mg/dL (ref >39)
LDL Calculated: 121 mg/dL — ABNORMAL HIGH (ref 0–99)
Triglycerides: 63 mg/dL (ref 0–149)
VLDL Cholesterol Cal: 13 mg/dL (ref 5–40)

## 2018-02-03 LAB — COMPREHENSIVE METABOLIC PANEL
ALT: 48 IU/L — ABNORMAL HIGH (ref 0–32)
AST: 15 IU/L (ref 0–40)
Albumin/Globulin Ratio: 1.7 (ref 1.2–2.2)
Albumin: 3.7 g/dL (ref 3.6–4.8)
Alkaline Phosphatase: 88 IU/L (ref 39–117)
BUN/Creatinine Ratio: 36 — ABNORMAL HIGH (ref 12–28)
BUN: 20 mg/dL (ref 8–27)
Bilirubin Total: 0.4 mg/dL (ref 0.0–1.2)
CO2: 24 mmol/L (ref 20–29)
Calcium: 9 mg/dL (ref 8.7–10.3)
Chloride: 103 mmol/L (ref 96–106)
Creatinine, Ser: 0.56 mg/dL — ABNORMAL LOW (ref 0.57–1.00)
GFR calc Af Amer: 112 mL/min/{1.73_m2} (ref >59)
GFR calc non Af Amer: 97 mL/min/{1.73_m2} (ref >59)
Globulin, Total: 2.2 g/dL (ref 1.5–4.5)
Glucose: 80 mg/dL (ref 65–99)
Potassium: 4.5 mmol/L (ref 3.5–5.2)
Sodium: 140 mmol/L (ref 134–144)
Total Protein: 5.9 g/dL — ABNORMAL LOW (ref 6.0–8.5)

## 2018-02-03 NOTE — Progress Notes (Signed)
Chief Complaint  Patient presents with  . New Patient (Initial Visit)    feet swelling    HPI   This is a 67yo with a 50 pack year smoking history who had an acute episode of aphasia, right side weakness and confusion 12/31/17. 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 The etiology of her symptoms is still unclear and she is currently being treated as a presumptive seizure still on Keppra and the mass is being evaluated by specialists at [facility], and will get input from [place].  She now has hypertension on lisinopril 2.5mg  daily.   BP Readings from Last 3 Encounters:  02/03/18 140/76  01/31/18 (!) 142/82  01/28/18 133/89   New patient to establish care Health maintenance needs She reports that besides the birth of her children she had not had any health care.  She got vaccines for international travel from the health department.  She has never had a mammogram or colonoscopy.    Former smoker She reports that she quit on 12/31/17 after 50 years She states that she liked smoking but this scared her She quit while in the hospital for 2 weeks She has family with her all the time caring for her and is not having any irritability or depression  Depression screen PHQ 2/9 02/03/2018  Decreased Interest 0  Down, Depressed, Hopeless 0  PHQ - 2 Score 0   Lower extremity edema She reports that for the past 2 days she had swelling in her legs It is not there first thing in the morning but by the end of the day her lower extremity edema gets much worse She states that it is swollen like tomato She denies shortness of breath or PND or DOE  No past medical history on file.  Current Outpatient Medications  Medication Sig Dispense Refill  . aspirin EC 81 MG tablet Take 1 tablet (81 mg total) by mouth daily.    [place] Kitchen dexamethasone (DECADRON) 4 MG tablet Take 1 tablet (4 mg total) by mouth every 12 (twelve)  hours. 60 tablet 0  . levETIRAcetam (KEPPRA) 500 MG tablet Take 1 tablet (500 mg total) by mouth 2 (two) times daily. 60 tablet 0  . lisinopril (PRINIVIL,ZESTRIL) 2.5 MG tablet Take 1 tablet (2.5 mg total) by mouth at bedtime. 30 tablet 0  . Multiple Vitamin (MULTIVITAMIN WITH MINERALS) TABS tablet Take 1 tablet by mouth daily.     No current facility-administered medications for this visit.     Allergies: No Known Allergies  Past Surgical History:  Procedure Laterality Date  . APPLICATION OF CRANIAL NAVIGATION N/A 01/04/2018   Procedure: APPLICATION OF CRANIAL NAVIGATION;  Surgeon: Erline Levine, MD;  Location: [facility];  Service: Neurosurgery;  Laterality: N/A;  . PR DURAL GRAFT REPAIR,SPINE DEFECT Left 01/04/2018   Procedure: Left Pterional craniotomy for biopsy with Brainlab;  Surgeon: Erline Levine, MD;  Location: [facility];  Service: Neurosurgery;  Laterality: Left;  Left Pterional craniotomy for biopsy with Brainlab    Social History   Socioeconomic History  . Marital status: Married    Spouse name: Not on file  . Number of children: Not on file  . Years of education: Not on file  . Highest education level: Not on file  Occupational History  . Not on file  Social Needs  . Financial resource strain: Not on file  . Food insecurity:    Worry:  Not on file    Inability: Not on file  . Transportation needs:    Medical: Not on file    Non-medical: Not on file  Tobacco Use  . Smoking status: Former Smoker    Packs/day: 1.00    Types: Cigarettes  . Smokeless tobacco: Never Used  . Tobacco comment: plans to quit now  Substance and Sexual Activity  . Alcohol use: No    Frequency: Never  . Drug use: No  . Sexual activity: Yes  Lifestyle  . Physical activity:    Days per week: Not on file    Minutes per session: Not on file  . Stress: Not on file  Relationships  . Social connections:    Talks on phone: Not on file    Gets together: Not on file    Attends religious service: Not  on file    Active member of club or organization: Not on file    Attends meetings of clubs or organizations: Not on file    Relationship status: Not on file  Other Topics Concern  . Not on file  Social History Narrative  . Not on file    Family History  Problem Relation Age of Onset  . Colon cancer Mother   . CAD Father      ROS Review of Systems See HPI Constitution: No fevers or chills No malaise No diaphoresis Skin: No rash or itching Eyes: no blurry vision, no double vision GU: no dysuria or hematuria Neuro: see hpi all others reviewed and negative   Objective: Vitals:   02/03/18 0912 02/03/18 1004  BP: (!) 160/74 140/76  Pulse: 72   Resp: 18   Temp: 98.6 F (37 C)   TempSrc: Oral   SpO2: 97%   Weight: 221 lb 12.8 oz (100.6 kg)   Height: 5\' 6"  (1.676 m)     Physical Exam  Constitutional: She is oriented to person, place, and time. She appears well-developed and well-nourished.  HENT:  Head: Normocephalic and atraumatic.  Eyes: Conjunctivae and EOM are normal.  Neck: Normal range of motion. Neck supple. No JVD present.  Cardiovascular: Normal rate, regular rhythm, normal heart sounds and intact distal pulses.  Pulses:      Dorsalis pedis pulses are 2+ on the right side, and 2+ on the left side.       Posterior tibial pulses are 2+ on the right side, and 2+ on the left side.  Pulmonary/Chest: Effort normal and breath sounds normal. No stridor. No respiratory distress. She has no wheezes.  Musculoskeletal: Normal range of motion. She exhibits edema.       Right foot: There is normal range of motion and no deformity.       Left foot: There is normal range of motion and no deformity.  Pitting edema to the knee bilaterally  Neurological: She is alert and oriented to person, place, and time.  Skin:  Dark gray thickened nails on both feet, with dry flaky heel  Psychiatric: She has a normal mood and affect. Her behavior is normal. Judgment and thought content  normal.      LV EF: 55% -   60%  ------------------------------------------------------------------- Indications:      CVA 436.  ------------------------------------------------------------------- History:   Risk factors:  Hypertension.  ------------------------------------------------------------------- Study Conclusions  - Left ventricle: The cavity size was normal. There was mild   concentric hypertrophy. Systolic function was normal. The   estimated ejection fraction was in the range of 55% to  60%. Wall   motion was normal; there were no regional wall motion   abnormalities. Doppler parameters are consistent with abnormal   left ventricular relaxation (grade 1 diastolic dysfunction). - Aortic valve: Transvalvular velocity was within the normal range.   There was no stenosis. There was no regurgitation. - Mitral valve: Transvalvular velocity was within the normal range.   There was no evidence for stenosis. There was trivial   regurgitation. - Left atrium: The atrium was severely dilated. - Right ventricle: The cavity size was normal. Wall thickness was   normal. Systolic function was normal. - Atrial septum: No defect or patent foramen ovale was identified   by color flow Doppler. - Tricuspid valve: There was mild regurgitation. - Pulmonary arteries: Systolic pressure was within the normal   range. PA peak pressure: 32 mm Hg (S). - Global longitudinal strain -19.2% (normal).   01/02/18 The EEG is abnormal and findings are suggestive of  mild generalized cerebral dysfunction.  Focal left frontotemporal slowing suggesting underlying structural defect. Epileptiform features were not seen during this recording.   01/03/18 IMPRESSION: Large area of edema in the left cerebral white matter similar to the recent MRI. Contrast not administered today however abnormal enhancement along the left middle cerebral artery in the sylvian fissure is seen previously, most likely  tumor. 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 4 mm midline shift to the right unchanged.  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 Small hyperintensities left cerebellum and right periventricular white matter likely due to chronic ischemia. These are unchanged.   Assessment and Plan Tara [name] was seen today for new patient (initial visit).  Diagnoses and all orders for this visit:  Reactive hypertension- bp in good range, continue lisinopril 2.5mg  -     Comprehensive metabolic panel -     Lipid panel  Hyperglycemia- discussed prediabetes and discussed that we will continue to monitor  She is on Decadron but not sure for how long since she is almost back to her baseline -     Comprehensive metabolic panel  Screening for colon cancer -     Ambulatory referral to Gastroenterology  Bilateral lower extremity edema- reviewed labs and echo Pt should wear compression stockings and lower dietary sodium intake  CNS mass- continue Neuro follow up   Onychomycosis- discussed that she should not take the oral treatments at this time because it requires a lot of monitoring and the risk is not worth the benefit.  Will refer to Podiatry later after her work up is completed from the CNS mass     Spent 45 minutes reviewed hospital course, imaging studies, specialists notes and discussed vaccinations as well as reviewing NCIR Discussed that she will need close follow up for now  Areas that were noted but not addressed include -  bone density screening as well as Hep C screening These will be discussed more at the next visit  Return to clinic for pap smear and physical    [place]

## 2018-02-03 NOTE — Patient Instructions (Addendum)
1. You will be contacted about your colonoscopy.  They usually schedule 2-3 months out.   2. Please return to clinic in one month for your pap smear.    3. You can call and get an appointment for a mammogram anytime in the next month.  At our next visit I will go over the results with you.   4. Continue to abstain from smoking.  If you get cravings or mood changes give [place] a call. We have ways to help.    We recommend that you schedule a mammogram for breast cancer screening. Typically, you do not need a referral to do this. Please contact a local imaging center to schedule your mammogram.  [facility] - [phone]  *ask for the Radiology Department [facility] - [phone] or [phone]  [facility] - [phone] [place] [phone] [facility] - [phone]  *ask for the [facility] - [phone]  *ask for the Radiology Department [facility] - [phone]  *ask for the [facility] - [phone]     IF you received an x-ray today, you will receive an invoice from [facility]. Please contact [facility] Radiology at [phone] with questions or concerns regarding your invoice.   IF you received labwork today, you will receive an invoice from [facility]. Please contact [facility] at [phone] with questions or concerns regarding your invoice.   Our billing staff will not be able to assist you with questions regarding bills from these companies.  You will be contacted with the lab results as soon as they are available. The fastest way to get your results is to activate your My Chart account. Instructions are located on the last page of this paperwork. If you have not heard from [place] regarding the results in 2 weeks, please contact this office.     Colonoscopy, Adult A colonoscopy is an exam to look at  the entire large intestine. During the exam, a lubricated, bendable tube is inserted into the anus and then passed into the rectum, colon, and other parts of the large intestine. A colonoscopy is often done as a part of normal colorectal screening or in response to certain symptoms, such as anemia, persistent diarrhea, abdominal pain, and blood in the stool. The exam can help screen for and diagnose medical problems, including:  Tumors.  Polyps.  Inflammation.  Areas of bleeding.  Tell a health care provider about:  Any allergies you have.  All medicines you are taking, including vitamins, herbs, eye drops, creams, and over-the-counter medicines.  Any problems you or family members have had with anesthetic medicines.  Any blood disorders you have.  Any surgeries you have had.  Any medical conditions you have.  Any problems you have had passing stool. What are the risks? Generally, this is a safe procedure. However, problems may occur, including:  Bleeding.  A tear in the intestine.  A reaction to medicines given during the exam.  Infection (rare).  What happens before the procedure? Eating and drinking restrictions Follow instructions from your health care provider about eating and drinking, which may include:  A few days before the procedure - follow a low-fiber diet. Avoid nuts, seeds, dried fruit, raw fruits, and vegetables.  1-3 days before the procedure - follow a clear liquid diet. Drink only clear liquids, such as clear broth or bouillon, black coffee or tea, clear juice, clear soft  drinks or sports drinks, gelatin dessert, and popsicles. Avoid any liquids that contain red or purple dye.  On the day of the procedure - do not eat or drink anything during the 2 hours before the procedure, or within the time period that your health care provider recommends.  Bowel prep If you were prescribed an oral bowel prep to clean out your colon:  Take it as told by your  health care provider. Starting the day before your procedure, you will need to drink a large amount of medicated liquid. The liquid will cause you to have multiple loose stools until your stool is almost clear or light green.  If your skin or anus gets irritated from diarrhea, you may use these to relieve the irritation: ? Medicated wipes, such as adult wet wipes with aloe and vitamin E. ? A skin soothing-product like petroleum jelly.  If you vomit while drinking the bowel prep, take a break for up to 60 minutes and then begin the bowel prep again. If vomiting continues and you cannot take the bowel prep without vomiting, call your health care provider.  General instructions  Ask your health care provider about changing or stopping your regular medicines. This is especially important if you are taking diabetes medicines or blood thinners.  Plan to have someone take you home from the hospital or clinic. What happens during the procedure?  An IV tube may be inserted into one of your veins.  You will be given medicine to help you relax (sedative).  To reduce your risk of infection: ? Your health care team will wash or sanitize their hands. ? Your anal area will be washed with soap.  You will be asked to lie on your side with your knees bent.  Your health care provider will lubricate a long, thin, flexible tube. The tube will have a camera and a light on the end.  The tube will be inserted into your anus.  The tube will be gently eased through your rectum and colon.  Air will be delivered into your colon to keep it open. You may feel some pressure or cramping.  The camera will be used to take images during the procedure.  A small tissue sample may be removed from your body to be examined under a microscope (biopsy). If any potential problems are found, the tissue will be sent to a lab for testing.  If small polyps are found, your health care provider may remove them and have them  checked for cancer cells.  The tube that was inserted into your anus will be slowly removed. The procedure may vary among health care providers and hospitals. What happens after the procedure?  Your blood pressure, heart rate, breathing rate, and blood oxygen level will be monitored until the medicines you were given have worn off.  Do not drive for 24 hours after the exam.  You may have a small amount of blood in your stool.  You may pass gas and have mild abdominal cramping or bloating due to the air that was used to inflate your colon during the exam.  It is up to you to get the results of your procedure. Ask your health care provider, or the department performing the procedure, when your results will be ready. This information is not intended to replace advice given to you by your health care provider. Make sure you discuss any questions you have with your health care provider. Document Released: 10/16/2000 Document Revised: 08/19/2016 Document Reviewed:  12/31/2015 Elsevier Interactive Patient Education  [employer].

## 2018-02-04 ENCOUNTER — Ambulatory Visit: Payer: Medicare Other | Admitting: Occupational Therapy

## 2018-02-04 ENCOUNTER — Encounter: Payer: Self-pay | Admitting: Physical Therapy

## 2018-02-04 ENCOUNTER — Ambulatory Visit: Payer: Medicare Other | Admitting: Physical Therapy

## 2018-02-04 DIAGNOSIS — R262 Difficulty in walking, not elsewhere classified: Secondary | ICD-10-CM | POA: Diagnosis not present

## 2018-02-04 DIAGNOSIS — R278 Other lack of coordination: Secondary | ICD-10-CM | POA: Diagnosis not present

## 2018-02-04 DIAGNOSIS — M6281 Muscle weakness (generalized): Secondary | ICD-10-CM

## 2018-02-04 DIAGNOSIS — R293 Abnormal posture: Secondary | ICD-10-CM | POA: Diagnosis not present

## 2018-02-04 DIAGNOSIS — R41844 Frontal lobe and executive function deficit: Secondary | ICD-10-CM

## 2018-02-04 DIAGNOSIS — R41840 Attention and concentration deficit: Secondary | ICD-10-CM

## 2018-02-04 DIAGNOSIS — R2681 Unsteadiness on feet: Secondary | ICD-10-CM | POA: Diagnosis not present

## 2018-02-04 NOTE — Patient Instructions (Addendum)
Functional Quadriceps: Sit to Stand    Sit on edge of chair, feet flat on floor. Stand upright, extending knees fully. Slowly sit back down. Repeat _10_ times per set. Do _1_ sets per session. Do _1-2_ sessions per day.  http://orth.exer.us/735   Copyright  VHI. All rights reserved.    Perform these next to a counter for balance assistance as needed: Walking on Heels   At counter: Walk on heels forward while continuing on a straight path, and then walk on heels backward to starting position. Repeat for 3 laps each way. Do _1-2__ sessions per day.  Copyright  VHI. All rights reserved.  Walking on Toes   At counter for balance as needed: Walk on toes forward while continuing on a straight path, and then backwards on toes to starting position. Repeat 3 laps each way. Do _1-2___ sessions per day.  Copyright  VHI. All rights reserved.  Feet Heel-Toe "Tandem"   At counter: Arms at sides, walk a straight line forward bringing one foot directly in front of the other, and then a straight line backwards bringing one foot directly behind the other one.  Repeat for _3 laps each way. Do _1-2_ sessions per day.  Copyright  VHI. All rights reserved.   Perform these in a corner with a chair in front of you for safety: Feet Together (Compliant Surface) Varied Arm Positions - Eyes Closed    Stand on compliant surface: _pillow/s_ with feet together and arms at sides. Close eyes and visualize upright position. Hold_30_ seconds. Repeat _3_ times per session. Do _1-2_ sessions per day.  Copyright  VHI. All rights reserved.    Feet Apart (Compliant Surface) Head Motion - Eyes Closed    Stand on compliant surface: _pillow/s_ with feet shoulder width apart. Close eyes and move head slowly: 1. Up and down  X 10 reps 2. Left and right x 10 reps 3. Diagonals up to the right/down to the left x 10 reps 4. Diagonals up to the left/down to the right x 10 reps  Do _1-2_ sessions per  day.  Copyright  VHI. All rights reserved.

## 2018-02-04 NOTE — Therapy (Signed)
[facility] [address] [place], [place], [address] Phone: [phone]   Fax:  [phone]  Occupational Therapy Treatment  Patient Details  Name: Tara [name] MRN: [id number] Date of Birth: 1950-11-25 Referring Provider: Alysia Penna   Encounter Date: 02/04/2018  OT End of Session - 02/04/18 1320    Visit Number  4    Number of Visits  25    Date for OT Re-Evaluation  04/19/18    Authorization Type  Medicare    Authorization Time Period  goals written for 12 weeks, may d/c after 8 weeks depending on progress    OT Start Time  1318    OT Stop Time  1400    OT Time Calculation (min)  42 min       No past medical history on file.  Past Surgical History:  Procedure Laterality Date  . APPLICATION OF CRANIAL NAVIGATION N/A 01/04/2018   Procedure: APPLICATION OF CRANIAL NAVIGATION;  Surgeon: Erline Levine, MD;  Location: [facility];  Service: Neurosurgery;  Laterality: N/A;  . PR DURAL GRAFT REPAIR,SPINE DEFECT Left 01/04/2018   Procedure: Left Pterional craniotomy for biopsy with Brainlab;  Surgeon: Erline Levine, MD;  Location: [facility];  Service: Neurosurgery;  Laterality: Left;  Left Pterional craniotomy for biopsy with Brainlab    There were no vitals filed for this visit.  Subjective Assessment - 02/04/18 1322    Subjective   Pt reports she is going to [facility] next week for more information regarding brain mass, she does not have results yet    Pertinent History   seizure 01/02/2018 ,craniotomy as well as biopsy for brain mass 01/04/2018 per Dr. Vertell Limber, CVA- pt is still awating results of biopsy    Patient Stated Goals  to get back my independence    Currently in Pain?  No/denies           Treatment: Organizing your day task problem #2, pt started activity at correct time, however she did not allow adequate time for completion of tasks(ie: planned to get haircut and by a birthday present in 30 mins slot). She  also did not recognize that due to 90 degree temperatures she would need to complete grocery shopping last. Ambulating while  tossing ball and performing category generation, Min- mod v.c/ difficulty for generating words. (This may be related to expressive aphasia)                 OT Short Term Goals - 02/04/18 1329      OT SHORT TERM GOAL #1   Title  I with HEP    Time  4    Period  Weeks    Status  On-going      OT SHORT TERM GOAL #2   Title  Pt will perform basic home management/ cooking with distant supervision demonstrating good safety awareness.    Time  4    Period  Weeks    Status  On-going      OT SHORT TERM GOAL #3   Title  Pt will demonstrate improved fine motor coordination for ADLs as evidenced by decreasing 9 hole peg test score by 3 secs for RUE.    Time  4    Period  Weeks    Status  On-going      OT SHORT TERM GOAL #4   Title  Pt will perform a basic functional organization task with no more than 2 v.c    Time  4    Period  Weeks    Status  On-going      OT SHORT TERM GOAL #5   Title  Pt will demonstrate ability to alternate attention between 2 functional tasks with 80% or better accuracy.    Time  4    Period  Weeks    Status  On-going        OT Long Term Goals - 01/19/18 1305      OT LONG TERM GOAL #1   Title  I with updated HEP.    Time  12    Period  Weeks    Status  New    Target Date  04/19/18      OT LONG TERM GOAL #2   Title  Pt will perform mod complex home management and cooking at a modified independent level demonstrating good safety awareness.    Time  12    Period  Weeks    Status  New      OT LONG TERM GOAL #3   Title   Pt will perform a moderate complex organization task independently    Time  12    Period  Weeks    Status  New      OT LONG TERM GOAL #4   Title  Pt will demonstrate ability to divide attention between a physical and cognitive task with 90% or better accuracy.    Time  12    Status  New             Plan - 02/04/18 1411    Clinical Impression Statement  Pt is progressing towards goals, however she made several errors in moderate level organization task.    Occupational Profile and client history currently impacting functional performance  Tara J Matthewsis a 67 y.o.right handed femalewith history of tobacco abuse on no prescription medications. Pt. has an [employer]. Pt is retired and lives with spouse and was independent prior to admission. Two-level home with bedroom on main level. Spouse works during the day but can stay home as needed    Rehab Potential  Good    Current Impairments/barriers affecting progress:  decreased awareness of deficits, await results of biopsy- pt is going to [facility] next week    OT Frequency  2x / week    OT Duration  12 weeks    OT Treatment/Interventions  Self-care/ADL training;Therapeutic exercise;Patient/family education;Neuromuscular education;Moist Heat;Paraffin;Fluidtherapy;Therapist, nutritional;Therapeutic activities;Cognitive remediation/compensation;Passive range of motion;Manual Therapy;DME and/or AE instruction;Ultrasound;Cryotherapy    Plan  moderate level organization task (ie planning grocery list and meal, estimating costs) ,cooking to follow written instructions    Consulted and Agree with Plan of Care  Patient;Family member/caregiver       Patient will benefit from skilled therapeutic intervention in order to improve the following deficits and impairments:  Abnormal gait, Decreased cognition, Impaired vision/preception, Decreased mobility, Decreased coordination, Decreased activity tolerance, Decreased endurance, Decreased range of motion, Decreased strength, Impaired UE functional use, Difficulty walking, Impaired perceived functional ability, Decreased safety awareness, Decreased knowledge of precautions, Decreased balance  Visit Diagnosis: Frontal lobe and executive function deficit  Other lack of coordination  Attention  and concentration deficit    Problem List Patient Active Problem List   Diagnosis Date Noted  . 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 Stroke ([facility]) 01/21/2018  . Reactive hypertension   . Hypoalbuminemia due to protein-calorie malnutrition ([facility])   . Seizures (  HCC)   . Elevated BUN   . Brain mass 01/06/2018  . Aphasia   . Abnormal CT of the head   . CNS mass 01/01/2018  . Acute metabolic encephalopathy 77/41/4239  . Headache 12/31/2017  . Hypertension 12/31/2017  . Hyperglycemia 12/31/2017  . Confusion 12/31/2017    Prem Coykendall 02/04/2018, 2:17 PM  [facility] [address] [place], [place], [address] Phone: [phone]   Fax:  [phone]  Name: MADYLYN INSCO MRN: [id number] Date of Birth: October 02, 1951

## 2018-02-05 NOTE — Therapy (Signed)
[facility] [address] [place], [place], [address] Phone: [phone]   Fax:  [phone]  Physical Therapy Treatment  Patient Details  Name: Tara [name] MRN: [id number] Date of Birth: 1950-11-10 Referring Provider: Alysia Penna   Encounter Date: 02/04/2018  PT End of Session - 02/04/18 1420    Visit Number  3    Number of Visits  13    Date for PT Re-Evaluation  03/20/18    Authorization Type  MCR    Authorization Time Period  01/19/18 to 03/20/18    PT Start Time  1415 started session early due to previous pt did not show    PT Stop Time  1457    PT Time Calculation (min)  42 min    Equipment Utilized During Treatment  Gait belt    Activity Tolerance  Patient tolerated treatment well    Behavior During Therapy  [facility] for tasks assessed/performed       History reviewed. No pertinent past medical history.  Past Surgical History:  Procedure Laterality Date  . APPLICATION OF CRANIAL NAVIGATION N/A 01/04/2018   Procedure: APPLICATION OF CRANIAL NAVIGATION;  Surgeon: Erline Levine, MD;  Location: [facility];  Service: Neurosurgery;  Laterality: N/A;  . PR DURAL GRAFT REPAIR,SPINE DEFECT Left 01/04/2018   Procedure: Left Pterional craniotomy for biopsy with Brainlab;  Surgeon: Erline Levine, MD;  Location: [facility];  Service: Neurosurgery;  Laterality: Left;  Left Pterional craniotomy for biopsy with Brainlab    There were no vitals filed for this visit.  Subjective Assessment - 02/04/18 1418    Subjective  Has seen a primary MD regarding her medical issues (irregular heart rate and LE edema). No falls or pain to report.     Patient is accompained by:  Family member    Pertinent History  seixure d/o, HTN    Patient Stated Goals  Be able to help my daughter with her baby ( due in one month, [place], [place])    Currently in Pain?  No/denies    Pain Score  0-No pain            Balance Exercises - 02/04/18 1448        Balance Exercises: Standing   Rockerboard  Anterior/posterior;Lateral;EO;EC;10 reps    Balance Beam  standing across blue foam beam with no UE support: alternating fwd heel taps x 10 bil, then bwd toe taps x 10 bil. min assist for balance with cues for step length and weight shifting to assist with balance recovery.                     Balance Exercises: Standing   Rebounder Limitations  performed both ways on balance board with no UE support: rocking board with emphasis on tall posture with EO x10, then EC x10. min guard to min assist for balance with cues on weight shifitng to assist with balance recovery.      issued the following to HEP today with min guard assist for balance and cues on correct form/technique. Functional Quadriceps: Sit to Stand    Sit on edge of chair, feet flat on floor. Stand upright, extending knees fully. Slowly sit back down. Repeat _10_ times per set. Do _1_ sets per session. Do _1-2_ sessions per day.  http://orth.exer.us/735   Copyright  VHI. All rights reserved.    Perform these next to a counter for balance assistance as needed: Walking on Heels   At counter: Walk  on heels forward while continuing on a straight path, and then walk on heels backward to starting position. Repeat for 3 laps each way. Do _1-2__ sessions per day.  Copyright  VHI. All rights reserved.  Walking on Toes   At counter for balance as needed: Walk on toes forward while continuing on a straight path, and then backwards on toes to starting position. Repeat 3 laps each way. Do _1-2___ sessions per day.  Copyright  VHI. All rights reserved.  Feet Heel-Toe "Tandem"   At counter: Arms at sides, walk a straight line forward bringing one foot directly in front of the other, and then a straight line backwards bringing one foot directly behind the other one.  Repeat for _3 laps each way. Do _1-2_ sessions per day.  Copyright  VHI. All rights reserved.   Perform these in  a corner with a chair in front of you for safety: Feet Together (Compliant Surface) Varied Arm Positions - Eyes Closed    Stand on compliant surface: _pillow/s_ with feet together and arms at sides. Close eyes and visualize upright position. Hold_30_ seconds. Repeat _3_ times per session. Do _1-2_ sessions per day.  Copyright  VHI. All rights reserved.    Feet Apart (Compliant Surface) Head Motion - Eyes Closed    Stand on compliant surface: _pillow/s_ with feet shoulder width apart. Close eyes and move head slowly: 1. Up and down  X 10 reps 2. Left and right x 10 reps 3. Diagonals up to the right/down to the left x 10 reps 4. Diagonals up to the left/down to the right x 10 reps  Do _1-2_ sessions per day.  Copyright  VHI. All rights reserved.    PT Education - 02/04/18 1700    Education provided  Yes    Education Details  HEP for strengthening and balance    Person(s) Educated  Patient    Methods  Explanation;Demonstration;Verbal cues;Handout    Comprehension  Verbalized understanding;Returned demonstration       PT Short Term Goals - 01/31/18 1939      PT SHORT TERM GOAL #1   Title  Complete 6 MWT. Update LTG if appropriate. (Target 02/04/18)    Baseline  01/31/18 met    Status  Achieved        PT Long Term Goals - 01/31/18 1940      PT LONG TERM GOAL #1   Title  Patient will be independent with HEP and able to verbalize a plan for continued community-based activity upon d/c from PT (Target for all LTGs 03/11/18--due to delayed start by 1.5 wks after evaluation based on census)      PT LONG TERM GOAL #2   Title  Patient will score >=22/30 on FGA to demonstrate improved balance and lesser fall risk.     Baseline  17/30    Time  6    Period  Weeks    Status  New      PT LONG TERM GOAL #3   Title  Patient will improve 6 MWT to 1250 ft, progressing to an appropriate distance for her age to demonstrate improved gait, balance, and endurance.     Baseline  01/31/18  1050 ft (age based norm 1765 ft)    Time  6    Period  Weeks    Status  On-going      PT LONG TERM GOAL #4   Title  Patient will ambulate modified independently 200 ft over unlevel outdoor terrain  while engaged in cognitive challenge/dual task activity.      Time  6    Period  Weeks    Status  New              Patient will benefit from skilled therapeutic intervention in order to improve the following deficits and impairments:     Visit Diagnosis: Muscle weakness (generalized)  Unsteadiness on feet  Difficulty in walking, not elsewhere classified     Problem List Patient Active Problem List   Diagnosis Date Noted  . 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 Stroke ([facility]) 01/21/2018  . Reactive hypertension   . Hypoalbuminemia due to protein-calorie malnutrition ([facility])   . Seizures ([facility])   . Elevated BUN   . Brain mass 01/06/2018  . Aphasia   . Abnormal CT of the head   . CNS mass 01/01/2018  . Acute metabolic encephalopathy 61/96/9409  . Headache 12/31/2017  . Hypertension 12/31/2017  . Hyperglycemia 12/31/2017  . Confusion 12/31/2017    Willow Ora, PTA, [facility] [address] [place], [place] [address] [phone] 02/05/18, 4:17 PM   Name: ISRAEL WUNDER MRN: [id number] Date of Birth: [birth year]

## 2018-02-07 ENCOUNTER — Encounter: Payer: Self-pay | Admitting: *Deleted

## 2018-02-07 DIAGNOSIS — H2513 Age-related nuclear cataract, bilateral: Secondary | ICD-10-CM | POA: Diagnosis not present

## 2018-02-07 DIAGNOSIS — H353131 Nonexudative age-related macular degeneration, bilateral, early dry stage: Secondary | ICD-10-CM | POA: Diagnosis not present

## 2018-02-07 NOTE — Progress Notes (Signed)
Letter sent crm done

## 2018-02-08 ENCOUNTER — Other Ambulatory Visit: Payer: Self-pay | Admitting: *Deleted

## 2018-02-08 ENCOUNTER — Ambulatory Visit: Payer: Medicare Other | Admitting: Physical Therapy

## 2018-02-08 ENCOUNTER — Encounter: Payer: Self-pay | Admitting: Physical Therapy

## 2018-02-08 ENCOUNTER — Ambulatory Visit: Payer: Medicare Other | Admitting: Occupational Therapy

## 2018-02-08 DIAGNOSIS — R278 Other lack of coordination: Secondary | ICD-10-CM | POA: Diagnosis not present

## 2018-02-08 DIAGNOSIS — R262 Difficulty in walking, not elsewhere classified: Secondary | ICD-10-CM | POA: Diagnosis not present

## 2018-02-08 DIAGNOSIS — R293 Abnormal posture: Secondary | ICD-10-CM | POA: Diagnosis not present

## 2018-02-08 DIAGNOSIS — R41840 Attention and concentration deficit: Secondary | ICD-10-CM | POA: Diagnosis not present

## 2018-02-08 DIAGNOSIS — R2681 Unsteadiness on feet: Secondary | ICD-10-CM | POA: Diagnosis not present

## 2018-02-08 DIAGNOSIS — I6389 Other cerebral infarction: Secondary | ICD-10-CM

## 2018-02-08 DIAGNOSIS — M6281 Muscle weakness (generalized): Secondary | ICD-10-CM | POA: Diagnosis not present

## 2018-02-08 DIAGNOSIS — R41844 Frontal lobe and executive function deficit: Secondary | ICD-10-CM

## 2018-02-08 NOTE — Patient Instructions (Signed)
Functional Quadriceps: Sit to Stand    Sit on edge of chair, feet flat on floor. Stand upright, extending knees fully. Slowly sit back down. Repeat _10_ times per set. Do _1_ sets per session. Do _1-2_ sessions per day.  http://orth.exer.us/735   Copyright  VHI. All rights reserved.    Perform these next to a counter for balance assistance as needed: Walking on Heels   At counter: Walk on heels forward while continuing on a straight path, and then walk on heels backward to starting position. Repeat for 3 laps each way. Do _1-2__ sessions per day.  Copyright  VHI. All rights reserved.  Walking on Toes   At counter for balance as needed: Walk on toes forward while continuing on a straight path, and then backwards on toes to starting position. Repeat 3 laps each way. Do _1-2___ sessions per day.  Copyright  VHI. All rights reserved.  Feet Heel-Toe "Tandem"   At counter: Arms at sides, walk a straight line forward bringing one foot directly in front of the other, and then a straight line backwards bringing one foot directly behind the other one.  Repeat for _3 laps each way. Do _1-2_ sessions per day.  Copyright  VHI. All rights reserved.   Perform these in a corner with a chair in front of you for safety: Feet Together (Compliant Surface) Varied Arm Positions - Eyes Closed    Stand on compliant surface: _pillow/s_ with feet together and arms at sides. Close eyes and visualize upright position. Hold_30_ seconds. Repeat _3_ times per session. Do _1-2_ sessions per day.  Copyright  VHI. All rights reserved.    Feet Apart (Compliant Surface) Head Motion - Eyes Closed    Stand on compliant surface: _pillow/s_ with feet shoulder width apart. Close eyes and move head slowly: 1. Up and down  X 10 reps 2. Left and right x 10 reps 3. Diagonals up to the right/down to the left x 10 reps 4. Diagonals up to the left/down to the right x 10 reps  Do _1-2_ sessions per  day.

## 2018-02-08 NOTE — Therapy (Signed)
[facility] [address] [place], [place], [address] Phone: [phone]   Fax:  [phone]  Occupational Therapy Treatment  Patient Details  Name: Tara [name] MRN: [id number] Date of Birth: 05/07/1951 Referring Provider: Alysia Penna   Encounter Date: 02/08/2018  OT End of Session - 02/08/18 1328    Visit Number  5    Number of Visits  25    Date for OT Re-Evaluation  04/19/18    Authorization Type  Medicare    Authorization Time Period  goals written for 12 weeks, may d/c after 8 weeks depending on progress    OT Start Time  1318    OT Stop Time  1400    OT Time Calculation (min)  42 min    Activity Tolerance  Patient tolerated treatment well    Behavior During Therapy  [facility] for tasks assessed/performed       No past medical history on file.  Past Surgical History:  Procedure Laterality Date  . APPLICATION OF CRANIAL NAVIGATION N/A 01/04/2018   Procedure: APPLICATION OF CRANIAL NAVIGATION;  Surgeon: Erline Levine, MD;  Location: [facility];  Service: Neurosurgery;  Laterality: N/A;  . PR DURAL GRAFT REPAIR,SPINE DEFECT Left 01/04/2018   Procedure: Left Pterional craniotomy for biopsy with Brainlab;  Surgeon: Erline Levine, MD;  Location: [facility];  Service: Neurosurgery;  Laterality: Left;  Left Pterional craniotomy for biopsy with Brainlab    There were no vitals filed for this visit.  Subjective Assessment - 02/08/18 1654    Subjective   Denies pain    Pertinent History   seizure 01/02/2018 ,craniotomy as well as biopsy for brain mass 01/04/2018 per Dr. Vertell Limber, CVA- pt is still awating results of biopsy    Patient Stated Goals  to get back my independence    Currently in Pain?  No/denies           Treatment:Mod complex organization task to plan dinner meals for a week, generate a grocery list and estimate costs, pt required min v.c for generating several words and for correctly calculating costs. Pt made  several errors with calculator use which impacted her tabulation, min v.c to correct.                      OT Short Term Goals - 02/04/18 1329      OT SHORT TERM GOAL #1   Title  I with HEP    Time  4    Period  Weeks    Status  On-going      OT SHORT TERM GOAL #2   Title  Pt will perform basic home management/ cooking with distant supervision demonstrating good safety awareness.    Time  4    Period  Weeks    Status  On-going      OT SHORT TERM GOAL #3   Title  Pt will demonstrate improved fine motor coordination for ADLs as evidenced by decreasing 9 hole peg test score by 3 secs for RUE.    Time  4    Period  Weeks    Status  On-going      OT SHORT TERM GOAL #4   Title  Pt will perform a basic functional organization task with no more than 2 v.c    Time  4    Period  Weeks    Status  On-going      OT SHORT TERM GOAL #5   Title  Pt will demonstrate ability to alternate attention between 2 functional tasks with 80% or better accuracy.    Time  4    Period  Weeks    Status  On-going        OT Long Term Goals - 01/19/18 1305      OT LONG TERM GOAL #1   Title  I with updated HEP.    Time  12    Period  Weeks    Status  New    Target Date  04/19/18      OT LONG TERM GOAL #2   Title  Pt will perform mod complex home management and cooking at a modified independent level demonstrating good safety awareness.    Time  12    Period  Weeks    Status  New      OT LONG TERM GOAL #3   Title   Pt will perform a moderate complex organization task independently    Time  12    Period  Weeks    Status  New      OT LONG TERM GOAL #4   Title  Pt will demonstrate ability to divide attention between a physical and cognitive task with 90% or better accuracy.    Time  12    Status  New            Plan - 02/08/18 1332    Clinical Impression Statement  Pt is progressing towards goals. Pt demonstrated improved attention to detail today during moderate  level organization task.    Rehab Potential  Good    Current Impairments/barriers affecting progress:  decreased awareness of deficits, await results of biopsy- pt is going to [facility] next week    OT Frequency  2x / week    OT Duration  12 weeks    OT Treatment/Interventions  Self-care/ADL training;Therapeutic exercise;Patient/family education;Neuromuscular education;Moist Heat;Paraffin;Fluidtherapy;Therapist, nutritional;Therapeutic activities;Cognitive remediation/compensation;Passive range of motion;Manual Therapy;DME and/or AE instruction;Ultrasound;Cryotherapy    Plan  coordination, RUE functional use,  cooking following written instructions    Consulted and Agree with Plan of Care  Patient;Family member/caregiver    Family Member Consulted  son       Patient will benefit from skilled therapeutic intervention in order to improve the following deficits and impairments:     Visit Diagnosis: Frontal lobe and executive function deficit  Attention and concentration deficit    Problem List Patient Active Problem List   Diagnosis Date Noted  . 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 Stroke ([place]) 01/21/2018  . Reactive hypertension   . Hypoalbuminemia due to protein-calorie malnutrition ([place])   . Seizures ([place])   . Elevated BUN   . Brain mass 01/06/2018  . Aphasia   . Abnormal CT of the head   . CNS mass 01/01/2018  . Acute metabolic encephalopathy 43/15/4008  . Headache 12/31/2017  . Hypertension 12/31/2017  . Hyperglycemia 12/31/2017  . Confusion 12/31/2017    RINE,KATHRYN 02/08/2018, 4:57 PM  [facility] [address] [place], [place], [address] Phone: [phone]   Fax:  [phone]  Name: Tara [name] MRN: [id number] Date of Birth: [birth year]

## 2018-02-09 ENCOUNTER — Other Ambulatory Visit: Payer: Self-pay | Admitting: Family Medicine

## 2018-02-09 ENCOUNTER — Telehealth: Payer: Self-pay | Admitting: Family Medicine

## 2018-02-09 NOTE — Telephone Encounter (Signed)
Unable to leave a message - mail box is full. Pt.'s medications are refilled by 2 different providers . Not Dr. [name].

## 2018-02-09 NOTE — Telephone Encounter (Signed)
See request °

## 2018-02-09 NOTE — Telephone Encounter (Signed)
Please advise 

## 2018-02-09 NOTE — Therapy (Signed)
[facility] [address] [place], [place], [address] Phone: [phone]   Fax:  [phone]  Physical Therapy Treatment  Patient Details  Name: Tara [name] MRN: [id number] Date of Birth: 04/20/1951 Referring Provider: Alysia Penna   Encounter Date: 02/08/2018  PT End of Session - 02/08/18 1700    Visit Number  4    Number of Visits  13    Date for PT Re-Evaluation  03/20/18    Authorization Type  MCR    Authorization Time Period  01/19/18 to 03/20/18    PT Start Time  1405    PT Stop Time  1445    PT Time Calculation (min)  40 min    Equipment Utilized During Treatment  Gait belt    Activity Tolerance  Patient tolerated treatment well    Behavior During Therapy  [facility] for tasks assessed/performed       History reviewed. No pertinent past medical history.  Past Surgical History:  Procedure Laterality Date  . APPLICATION OF CRANIAL NAVIGATION N/A 01/04/2018   Procedure: APPLICATION OF CRANIAL NAVIGATION;  Surgeon: Erline Levine, MD;  Location: [facility];  Service: Neurosurgery;  Laterality: N/A;  . PR DURAL GRAFT REPAIR,SPINE DEFECT Left 01/04/2018   Procedure: Left Pterional craniotomy for biopsy with Brainlab;  Surgeon: Erline Levine, MD;  Location: [facility];  Service: Neurosurgery;  Laterality: Left;  Left Pterional craniotomy for biopsy with Brainlab    There were no vitals filed for this visit.  Subjective Assessment - 02/08/18 1410    Subjective  Feels stronger and her leg swelling is going down. Going to [facility] center for further testing.     Patient is accompained by:  Family member    Pertinent History  seixure d/o, HTN    Patient Stated Goals  Be able to help my daughter with her baby ( due in one month, [place], [place])    Currently in Pain?  No/denies                            Balance Exercises - 02/08/18 1432      Balance Exercises: Standing   Standing Eyes Opened   Narrow base of support (BOS);Wide (BOA);Head turns;Foam/compliant surface    Standing Eyes Closed  Narrow base of support (BOS);Wide (BOA);Head turns;Foam/compliant surface    SLS with Vectors  Foam/compliant surface single and double cone taps with min assist    Tandem Gait  Forward;Retro;Intermittent upper extremity support    Sit to Stand Time  10 reps from regular chair    Other Standing Exercises  standing on blue mat on ramp; up/down facing; EC head turns and marching; EO ball toss; walking with ball toss (no imbalance noted) walking and bouncing ball with slight drifting and difficulty keeping up with where she should be to catch the ball      OTAGO PROGRAM   Heel Walking  No support forward and backward    Toe Walk  No support forward and backward          PT Short Term Goals - 01/31/18 1939      PT SHORT TERM GOAL #1   Title  Complete 6 MWT. Update LTG if appropriate. (Target 02/04/18)    Baseline  01/31/18 met    Status  Achieved        PT Long Term Goals - 01/31/18 1940      PT LONG TERM GOAL #  1   Title  Patient will be independent with HEP and able to verbalize a plan for continued community-based activity upon d/c from PT (Target for all LTGs 03/11/18--due to delayed start by 1.5 wks after evaluation based on census)      PT LONG TERM GOAL #2   Title  Patient will score >=22/30 on FGA to demonstrate improved balance and lesser fall risk.     Baseline  17/30    Time  6    Period  Weeks    Status  New      PT LONG TERM GOAL #3   Title  Patient will improve 6 MWT to 1250 ft, progressing to an appropriate distance for her age to demonstrate improved gait, balance, and endurance.     Baseline  01/31/18 1050 ft (age based norm 1765 ft)    Time  6    Period  Weeks    Status  On-going      PT LONG TERM GOAL #4   Title  Patient will ambulate modified independently 200 ft over unlevel outdoor terrain while engaged in cognitive challenge/dual task activity.      Time  6     Period  Weeks    Status  New            Plan - 02/09/18 1829    Clinical Impression Statement  Session included balance training in conjunction with solid vs compliant surface, scanning/head turns, cognitive challenges, dual tasks while walking and review of HEP. Patient continues to improve and can continue to benefit from PT.     Rehab Potential  Good    Clinical Impairments Affecting Rehab Potential  ? degree of cognitive deficits (she downplays and ?expressive difficulties make it difficult to discern cognition vs language deficit)    PT Frequency  2x / week    PT Duration  6 weeks    PT Treatment/Interventions  ADLs/Self Care Home Management;Gait training;Cognitive remediation;Neuromuscular re-education;Balance training;Therapeutic exercise;Therapeutic activities;Functional mobility training;Stair training;Patient/family education;Passive range of motion    PT Next Visit Plan  pt mentioned eye doctor found she had "blind spots" in both inferior quadrants--set up obstacles for her to navigate around and if problematic discuss scanning; higher level dynamic balance, compliant surfaces including outdoors    Consulted and Agree with Plan of Care  Patient;Family member/caregiver    Family Member Consulted  son       Patient will benefit from skilled therapeutic intervention in order to improve the following deficits and impairments:  Decreased balance, Decreased cognition, Decreased strength, Decreased safety awareness, Difficulty walking, Postural dysfunction  Visit Diagnosis: Unsteadiness on feet  Difficulty in walking, not elsewhere classified     Problem List Patient Active Problem List   Diagnosis Date Noted  . 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 Stroke ([place]) 01/21/2018  . Reactive hypertension   . Hypoalbuminemia due to protein-calorie malnutrition ([place])   . Seizures ([place])    . Elevated BUN   . Brain mass 01/06/2018  . Aphasia   . Abnormal CT of the head   . CNS mass 01/01/2018  . Acute metabolic encephalopathy 17/51/0258  . Headache 12/31/2017  . Hypertension 12/31/2017  . Hyperglycemia 12/31/2017  . Confusion 12/31/2017    Rexanne Mano, PT 02/09/2018, 6:36 PM  [facility] [address] [place], [place], [address] Phone: [phone]   Fax:  [phone]  Name: Tara [name] MRN: [id number] Date of Birth: Mar 26, 1951

## 2018-02-09 NOTE — Telephone Encounter (Signed)
Copied from [place] [id number]. Topic: Quick Communication - Rx Refill/Question >> Feb 09, 2018 11:38 AM Carolyn Stare wrote: Medication   dexamethasone (DECADRON) 4 MG tablet       This med was refuse Dr [name]    levETIRAcetam (KEPPRA) 500 MG tablet   lisinopril (PRINIVIL,ZESTRIL) 2.5 MG tablet   Has the patient contacted their pharmacy  yes   Preferred Pharmacy  [facility]: Please be advised that RX refills may take up to 3 business days. We ask that you follow-up with your pharmacy.

## 2018-02-10 ENCOUNTER — Other Ambulatory Visit: Payer: Self-pay | Admitting: *Deleted

## 2018-02-10 ENCOUNTER — Encounter: Payer: Self-pay | Admitting: Physical Therapy

## 2018-02-10 ENCOUNTER — Ambulatory Visit: Payer: Medicare Other | Admitting: Physical Therapy

## 2018-02-10 ENCOUNTER — Ambulatory Visit: Payer: Medicare Other | Admitting: Speech Pathology

## 2018-02-10 ENCOUNTER — Ambulatory Visit: Payer: Medicare Other | Admitting: Occupational Therapy

## 2018-02-10 DIAGNOSIS — R278 Other lack of coordination: Secondary | ICD-10-CM

## 2018-02-10 DIAGNOSIS — R293 Abnormal posture: Secondary | ICD-10-CM | POA: Diagnosis not present

## 2018-02-10 DIAGNOSIS — M6281 Muscle weakness (generalized): Secondary | ICD-10-CM

## 2018-02-10 DIAGNOSIS — R41841 Cognitive communication deficit: Secondary | ICD-10-CM

## 2018-02-10 DIAGNOSIS — R4701 Aphasia: Secondary | ICD-10-CM

## 2018-02-10 DIAGNOSIS — R2681 Unsteadiness on feet: Secondary | ICD-10-CM

## 2018-02-10 DIAGNOSIS — R41840 Attention and concentration deficit: Secondary | ICD-10-CM | POA: Diagnosis not present

## 2018-02-10 DIAGNOSIS — R262 Difficulty in walking, not elsewhere classified: Secondary | ICD-10-CM

## 2018-02-10 DIAGNOSIS — R41844 Frontal lobe and executive function deficit: Secondary | ICD-10-CM

## 2018-02-10 MED ORDER — LEVETIRACETAM 500 MG PO TABS: 500.0000 mg | ORAL_TABLET | Freq: Two times a day (BID) | ORAL | 0 refills | Status: DC

## 2018-02-10 MED ORDER — LISINOPRIL 2.5 MG PO TABS: 2.5000 mg | ORAL_TABLET | Freq: Every day | ORAL | 3 refills | Status: DC

## 2018-02-10 MED ORDER — DEXAMETHASONE 4 MG PO TABS: 4.0000 mg | ORAL_TABLET | Freq: Two times a day (BID) | ORAL | 0 refills | Status: DC

## 2018-02-10 NOTE — Patient Instructions (Addendum)
Apps to try on your iPad  [facility] : Cognition and Language Activities  Homework: Make a list of the people you need to write notes to, write some of your more "formal" thank-you notes (drafts), Use your planner to keep a to-do list every day   Strategies for Improving Your Attention and Memory  Use good eye-contact  Give the speaker your undivided attention  Look directly at the speaker  Complete one task at a time  Avoid multitasking  Complete one task before starting a new one  Write a note to yourself if you think of something else that needs to be done  Let others know when you need quiet time and can't be interrupted  Don't answer the phone, texts, or emails while you are working on another task  Put aside distracting thoughts  If you find your mind wandering, refocus your attention on the speaker  Avoid off-topic comments or responses that may divert your attention   If something important comes to mind, let the speaker know and pause to write yourself a note: "Do you mind holding on a minute, I have to write something down."  Put thoughts on hold and focus on salient information  Limit distractions in your environment  Think about the environment around you  Limit background noise by turning off the TV or music, putting your phone away  Close the door and work in quiet  Use active listening  Actively participate in the conversation to stay focused  Paraphrase what you have heard to include the most important details  Adding some associations may help you remember  Ask questions to clarify certain points  Summarize the speaker's comments periodically  Avoid nodding your head and using "mhm" responses as these are more passive and don't help your attention  Alert the other person/people  It may be helpful to alert your listener to the fact that you may need reminders to keep on track  Tell the speaker in advance that you  may need to stop them and have them repeat salient information  If you lose focus, interject and let the person know, "I'm sorry, I lost you, can you tell me again?"  Write down information  Write down pertinent information as it comes up, such as telephone numbers, names of people, addresses, details from appointments and conversations, etc.  Memory Compensation Strategies  1. Use "WARM" strategy. W= write it down A=  associate it R=  repeat it M=  make a mental picture  2. You can keep a Social worker. Use a 3-ring notebook with sections for the following:  calendar, important names and phone numbers, medications, doctors' names/phone numbers, "to do list"/reminders, and a section to journal what you did each day  3. Use a calendar to write appointments down.  4. Write yourself a schedule for the day.  This can be placed on the calendar or in a separate section of the Memory Notebook.  Keeping a regular schedule can help memory.  5. Use medication organizer with sections for each day or morning/evening pills  You may need help loading it  6. Keep a basket, or pegboard by the door.   Place items that you need to take out with you in the basket or on the pegboard.  You may also want to include a message board for reminders.  7. Use sticky notes. Place sticky notes with reminders in a place where the task is performed.  For example:  "turn  off the stove" placed by the stove, "lock the door" placed on the door at eye level, "take your medications" on the bathroom mirror or by the place where you normally take your medications  8. Use alarms/timers.  Use while cooking to remind yourself to check on food or as a reminder to take your medicine, or as a reminder to make a call, or as a reminder to perform another task, etc.  9. Use a small tape recorder to record important information and notes for yourself.   Deneise Lever, MS, Actor

## 2018-02-10 NOTE — Therapy (Signed)
[facility] [address] [place], [place], [address] Phone: [phone]   Fax:  [phone]  Occupational Therapy Treatment  Patient Details  Name: Tara [name] MRN: [id number] Date of Birth: [birth year] Referring Provider: Alysia Penna   Encounter Date: 02/10/2018  OT End of Session - 02/10/18 1729    Visit Number  5    Number of Visits  25    Date for OT Re-Evaluation  04/19/18    Authorization Type  Medicare    Authorization Time Period  goals written for 12 weeks, may d/c after 8 weeks depending on progress    OT Start Time  1450    OT Stop Time  1530    OT Time Calculation (min)  40 min    Activity Tolerance  Patient tolerated treatment well    Behavior During Therapy  [facility] for tasks assessed/performed       No past medical history on file.  Past Surgical History:  Procedure Laterality Date  . APPLICATION OF CRANIAL NAVIGATION N/A 01/04/2018   Procedure: APPLICATION OF CRANIAL NAVIGATION;  Surgeon: Erline Levine, MD;  Location: [facility];  Service: Neurosurgery;  Laterality: N/A;  . PR DURAL GRAFT REPAIR,SPINE DEFECT Left 01/04/2018   Procedure: Left Pterional craniotomy for biopsy with Brainlab;  Surgeon: Erline Levine, MD;  Location: [place];  Service: Neurosurgery;  Laterality: Left;  Left Pterional craniotomy for biopsy with Brainlab    There were no vitals filed for this visit.  Subjective Assessment - 02/10/18 1729    Patient Stated Goals  to get back my independence   Pt denies pain          Treatment: Alternating attention task with visual component on constant therapy, 88% accuracy Divided attention task, amb while tossing ball and performing category generation, pt was noted to walk close to items on right side and bumped several items.. Environmental scanning 11/13 items located on first pass, 85% accuracy Pt reports she is going to [facility] for a second opinion regarding her brain  mass.                   OT Short Term Goals - 02/04/18 1329      OT SHORT TERM GOAL #1   Title  I with HEP    Time  4    Period  Weeks    Status  On-going      OT SHORT TERM GOAL #2   Title  Pt will perform basic home management/ cooking with distant supervision demonstrating good safety awareness.    Time  4    Period  Weeks    Status  On-going      OT SHORT TERM GOAL #3   Title  Pt will demonstrate improved fine motor coordination for ADLs as evidenced by decreasing 9 hole peg test score by 3 secs for RUE.    Time  4    Period  Weeks    Status  On-going      OT SHORT TERM GOAL #4   Title  Pt will perform a basic functional organization task with no more than 2 v.c    Time  4    Period  Weeks    Status  On-going      OT SHORT TERM GOAL #5   Title  Pt will demonstrate ability to alternate attention between 2 functional tasks with 80% or better accuracy.    Time  4  Period  Weeks    Status  On-going        OT Long Term Goals - 01/19/18 1305      OT LONG TERM GOAL #1   Title  I with updated HEP.    Time  12    Period  Weeks    Status  New    Target Date  04/19/18      OT LONG TERM GOAL #2   Title  Pt will perform mod complex home management and cooking at a modified independent level demonstrating good safety awareness.    Time  12    Period  Weeks    Status  New      OT LONG TERM GOAL #3   Title   Pt will perform a moderate complex organization task independently    Time  12    Period  Weeks    Status  New      OT LONG TERM GOAL #4   Title  Pt will demonstrate ability to divide attention between a physical and cognitive task with 90% or better accuracy.    Time  12    Status  New            Plan - 02/10/18 1454    Clinical Impression Statement  Pt is progressing towards goals. Pt demonstrates improved fine motor coordination. Pt continues to demonstrate challenges with alternating attention.    Rehab Potential  Good    Current  Impairments/barriers affecting progress:  decreased awareness of deficits, await results of biopsy- pt is going to [facility] next week    OT Frequency  2x / week    OT Duration  12 weeks    OT Treatment/Interventions  Self-care/ADL training;Therapeutic exercise;Patient/family education;Neuromuscular education;Moist Heat;Paraffin;Fluidtherapy;Therapist, nutritional;Therapeutic activities;Cognitive remediation/compensation;Passive range of motion;Manual Therapy;DME and/or AE instruction;Ultrasound;Cryotherapy    Plan   alternating attention, environmental scanning with a cognitive component, RUE strength and coordination.       Patient will benefit from skilled therapeutic intervention in order to improve the following deficits and impairments:  Abnormal gait, Decreased cognition, Impaired vision/preception, Decreased mobility, Decreased coordination, Decreased activity tolerance, Decreased endurance, Decreased range of motion, Decreased strength, Impaired UE functional use, Difficulty walking, Impaired perceived functional ability, Decreased safety awareness, Decreased knowledge of precautions, Decreased balance  Visit Diagnosis: No diagnosis found.    Problem List Patient Active Problem List   Diagnosis Date Noted  . 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 Stroke ([place]) 01/21/2018  . Reactive hypertension   . Hypoalbuminemia due to protein-calorie malnutrition ([place])   . Seizures ([place])   . Elevated BUN   . Brain mass 01/06/2018  . Aphasia   . Abnormal CT of the head   . CNS mass 01/01/2018  . Acute metabolic encephalopathy 42/59/5638  . Headache 12/31/2017  . Hypertension 12/31/2017  . Hyperglycemia 12/31/2017  . Confusion 12/31/2017    Yochanan Eddleman 02/10/2018, 5:32 PM  [facility] [address] [place], [place],  [address] Phone: [phone]   Fax:  [phone]  Name: Tara [name] MRN: [id number] Date of Birth: [birth year]

## 2018-02-10 NOTE — Therapy (Signed)
[facility] [address] [place], [place], [address] Phone: [phone]   Fax:  [phone]  Speech Language Pathology Treatment  Patient Details  Name: Tara [name] MRN: [id number] Date of Birth: 1951/08/24 Referring Provider: Dr. Alysia Penna   Encounter Date: 02/10/2018  End of Session - 02/10/18 1751    Visit Number  3    Number of Visits  17    Date for SLP Re-Evaluation  03/18/18    SLP Start Time  1531    SLP Stop Time   1611    SLP Time Calculation (min)  40 min    Activity Tolerance  Patient tolerated treatment well       No past medical history on file.  Past Surgical History:  Procedure Laterality Date  . APPLICATION OF CRANIAL NAVIGATION N/A 01/04/2018   Procedure: APPLICATION OF CRANIAL NAVIGATION;  Surgeon: Erline Levine, MD;  Location: [facility];  Service: Neurosurgery;  Laterality: N/A;  . PR DURAL GRAFT REPAIR,SPINE DEFECT Left 01/04/2018   Procedure: Left Pterional craniotomy for biopsy with Brainlab;  Surgeon: Erline Levine, MD;  Location: [facility];  Service: Neurosurgery;  Laterality: Left;  Left Pterional craniotomy for biopsy with Brainlab    There were no vitals filed for this visit.  Subjective Assessment - 02/10/18 1532    Subjective  "My speech is not wonderful."    Currently in Pain?  No/denies            ADULT SLP TREATMENT - 02/10/18 1531      General Information   Behavior/Cognition  Alert;Cooperative;Pleasant mood    Patient Positioning  Upright in chair      Treatment Provided   Treatment provided  Cognitive-Linquistic      Pain Assessment   Pain Assessment  No/denies pain      Cognitive-Linquistic Treatment   Treatment focused on  Cognition    Skilled Treatment  Pt told SLP she has not been able to write thank you notes due to not being able to think of the words. SLP assessed functional writing by having pt write a draft thank-you letter to her friend. Writing  was [facility] for this task. Pt showed SLP her day planner; she is currently using month view only. 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 With occasional min A, pt recorded reminders for upcoming in weekly/daily section of her planner. During this task pt frequently distracted by other tasks she needed to complete; SLP redirected pt to write these down and return to the task at hand. SLP demo'd TalkPath therapy application and provided information about other iPad applications for work outside of therapy.         Assessment / Recommendations / Plan   Plan  Continue with current plan of care      Progression Toward Goals   Progression toward goals  Progressing toward goals       SLP Education - 02/10/18 1751    Education provided  Yes    Education Details  strategies for attention/memory, TalkPath and Constant Therapy applications    Person(s) Educated  Patient    Methods  Explanation;Demonstration;Handout    Comprehension  Verbalized understanding;Need further instruction       SLP Short Term Goals - 02/10/18 1533      SLP SHORT TERM GOAL #1   Title  Pt will complete moderately complex  naming tasks with occasional min A over 3 sessions.    Status  Deferred      SLP SHORT TERM GOAL #2   Title  Pt will utilize compensations for aphasia in structured naming tasks 4/5 opportunites with occasional min A over 2 sessions    Status  Deferred      SLP SHORT TERM GOAL #3   Title  Pt will utiltize compensations for aphasia over 10 minute modconversation 3/5 opportunites with occasional min A oer 2 sessions    Status  Deferred      SLP SHORT TERM GOAL #4   Title  Written expression/reading comprehension assessment if indicated    Time  6    Period  Weeks    Status  Achieved      SLP SHORT TERM GOAL #5   Title  pt will tell SLP/indicate 4 memory strategies she could use to improve memory  over two sessions    Time  4    Period  Weeks    Status  On-going       SLP Long Term Goals - 02/10/18 1539      SLP LONG TERM GOAL #1   Title  Pt will complete complex naming tasks with rare min A over 3 sessions    Status  Deferred      SLP LONG TERM GOAL #2   Title  Pt will utilize compensations for aphasia over 15 minute complex conversation with rare min A over 2 sessions.    Time  11    Period  Weeks    Status  On-going      SLP LONG TERM GOAL #3   Title  pt will utilize at least one memory strategy in 5 therapy sessions    Time  8    Period  Weeks    Status  On-going       Plan - 02/10/18 1751    Clinical Impression Statement  Today pt presents with WFL/WNL language ability with moderately complex information. SLP agrees that further cognitive testing would be appropriate given functional deficits noted in attention, memory today.  I recommend skilled ST to maximize compensations for mild verbal expression deficits and for improved cognitive linguistic skills to promote incr'd independence and QOL.     Speech Therapy Frequency  2x / week    Treatment/Interventions  Cognitive reorganization;Functional tasks;Compensatory strategies;SLP instruction and feedback;Patient/family education;Internal/external aids;Environmental controls;Cueing hierarchy    Potential to Achieve Goals  Good    Consulted and Agree with Plan of Care  Patient;Family member/caregiver       Patient will benefit from skilled therapeutic intervention in order to improve the following deficits and impairments:   Aphasia  Cognitive communication deficit    Problem List Patient Active Problem List   Diagnosis Date Noted  . 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 Stroke ([place]) 01/21/2018  . Reactive hypertension   . Hypoalbuminemia due to protein-calorie malnutrition ([place])   . Seizures ([place])   . Elevated  BUN   . Brain mass 01/06/2018  . Aphasia   . Abnormal CT of the head   . CNS mass 01/01/2018  . Acute metabolic encephalopathy 23/76/2831  . Headache 12/31/2017  . Hypertension 12/31/2017  . Hyperglycemia 12/31/2017  . Confusion 12/31/2017   Deneise Lever, [place], [place] 02/10/2018, 5:54 PM  [facility] [phone]  [place], [place], [address] Phone: [phone]   Fax:  [phone]   Name: Tara [name] MRN: [id number] Date of Birth: Jan 28, 1951

## 2018-02-11 ENCOUNTER — Ambulatory Visit: Payer: Medicare Other

## 2018-02-11 DIAGNOSIS — G939 Disorder of brain, unspecified: Secondary | ICD-10-CM | POA: Diagnosis not present

## 2018-02-11 NOTE — Therapy (Signed)
[facility] [address] [place], [place], [address] Phone: [phone]   Fax:  [phone]  Physical Therapy Treatment  Patient Details  Name: Tara [name] MRN: [id number] Date of Birth: 02-10-1951 Referring Provider: Alysia Penna   Encounter Date: 02/10/2018  PT End of Session - 02/10/18 1625    Visit Number  5    Number of Visits  13    Date for PT Re-Evaluation  03/20/18    Authorization Type  MCR    Authorization Time Period  01/19/18 to 03/20/18    PT Start Time  1620    PT Stop Time  1700    PT Time Calculation (min)  40 min    Equipment Utilized During Treatment  Gait belt    Activity Tolerance  Patient tolerated treatment well    Behavior During Therapy  [facility] for tasks assessed/performed       History reviewed. No pertinent past medical history.  Past Surgical History:  Procedure Laterality Date  . APPLICATION OF CRANIAL NAVIGATION N/A 01/04/2018   Procedure: APPLICATION OF CRANIAL NAVIGATION;  Surgeon: Erline Levine, MD;  Location: [facility];  Service: Neurosurgery;  Laterality: N/A;  . PR DURAL GRAFT REPAIR,SPINE DEFECT Left 01/04/2018   Procedure: Left Pterional craniotomy for biopsy with Brainlab;  Surgeon: Erline Levine, MD;  Location: [facility];  Service: Neurosurgery;  Laterality: Left;  Left Pterional craniotomy for biopsy with Brainlab    There were no vitals filed for this visit.  Subjective Assessment - 02/10/18 1624    Subjective  No new complaints. No falls or pain to report. LE swelling is much better. Going to [facility] this weekend for consult/additional follow up.    Pertinent History  seixure d/o, HTN    Patient Stated Goals  Be able to help my daughter with her baby ( due in one month, [place], [place])    Currently in Pain?  No/denies    Pain Score  0-No pain          [facility] Adult PT Treatment/Exercise - 02/10/18 1627      Ambulation/Gait   Ambulation/Gait  Yes    Ambulation/Gait  Assistance  5: Supervision    Ambulation/Gait Assistance Details  had pt engaged in cognitive task during: giving ingrediants and step by step directions for how to bake her chocolate/caramel cookies    Ambulation Distance (Feet)  500 Feet    Assistive device  None    Gait Pattern  Step-through pattern;Decreased arm swing - right;Decreased arm swing - left;Poor foot clearance - left;Poor foot clearance - right    Ambulation Surface  Level;Unlevel;Indoor;Outdoor;Paved;Gravel;Grass      High Level Balance   High Level Balance Activities  Marching forwards;Marching backwards;Tandem walking;Other (comment) tandem/heel/toe gait fwd/bwd    High Level Balance Comments  on red mats next to counter: intermittent UE support for 3 laps each. min guard to min assist for balance, with cues on posture and ex form      Neuro Re-ed    Neuro Re-ed Details   for balance/coordinaiton/muscle re-ed: standing across blue foam beam with no UE support/intermittent touch to bars for balance: alternating fwd heel taps, then alternating bwd toe taps with cues for posture, weight shifting and step length/height; with narrow base of support EC no head movements, progressing to wider base of support for EC head movements left<>right and up<>down, cues on posture and weight shifting to assist with balance; then with hip width stance- alternating fwd toe taps  to foam bubbles with cues on posture/weight shifting. up to min assist needed with all activities for balance assistance.             PT Short Term Goals - 01/31/18 1939      PT SHORT TERM GOAL #1   Title  Complete 6 MWT. Update LTG if appropriate. (Target 02/04/18)    Baseline  01/31/18 met    Status  Achieved        PT Long Term Goals - 01/31/18 1940      PT LONG TERM GOAL #1   Title  Patient will be independent with HEP and able to verbalize a plan for continued community-based activity upon d/c from PT (Target for all LTGs 03/11/18--due to delayed start by 1.5  wks after evaluation based on census)      PT LONG TERM GOAL #2   Title  Patient will score >=22/30 on FGA to demonstrate improved balance and lesser fall risk.     Baseline  17/30    Time  6    Period  Weeks    Status  New      PT LONG TERM GOAL #3   Title  Patient will improve 6 MWT to 1250 ft, progressing to an appropriate distance for her age to demonstrate improved gait, balance, and endurance.     Baseline  01/31/18 1050 ft (age based norm 1765 ft)    Time  6    Period  Weeks    Status  On-going      PT LONG TERM GOAL #4   Title  Patient will ambulate modified independently 200 ft over unlevel outdoor terrain while engaged in cognitive challenge/dual task activity.      Time  6    Period  Weeks    Status  New            Plan - 02/10/18 1626    Clinical Impression Statement  Today's skilled session continued to focus on dual tasking and high level balance with no issues reported in session. Pt is making steady progress toward goals and should benefit from continued PT to progress toward unmet goals.     Rehab Potential  Good    Clinical Impairments Affecting Rehab Potential  ? degree of cognitive deficits (she downplays and ?expressive difficulties make it difficult to discern cognition vs language deficit)    PT Frequency  2x / week    PT Duration  6 weeks    PT Treatment/Interventions  ADLs/Self Care Home Management;Gait training;Cognitive remediation;Neuromuscular re-education;Balance training;Therapeutic exercise;Therapeutic activities;Functional mobility training;Stair training;Patient/family education;Passive range of motion    PT Next Visit Plan   higher level dynamic balance, compliant surfaces including outdoors    Consulted and Agree with Plan of Care  Patient;Family member/caregiver    Family Member Consulted  son       Patient will benefit from skilled therapeutic intervention in order to improve the following deficits and impairments:  Decreased balance,  Decreased cognition, Decreased strength, Decreased safety awareness, Difficulty walking, Postural dysfunction  Visit Diagnosis: Unsteadiness on feet  Difficulty in walking, not elsewhere classified  Muscle weakness (generalized)     Problem List Patient Active Problem List   Diagnosis Date Noted  . 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 Stroke ([place]) 01/21/2018  . Reactive hypertension   . Hypoalbuminemia due to protein-calorie malnutrition ([place])   . Seizures ([place])   .  Elevated BUN   . Brain mass 01/06/2018  . Aphasia   . Abnormal CT of the head   . CNS mass 01/01/2018  . Acute metabolic encephalopathy 97/18/2099  . Headache 12/31/2017  . Hypertension 12/31/2017  . Hyperglycemia 12/31/2017  . Confusion 12/31/2017    Willow Ora, PTA, [facility] [address] [place], [place] [address] [phone] 02/11/18, 1:43 PM   Name: Tara [name] MRN: [id number] Date of Birth: April 25, 1951

## 2018-02-14 ENCOUNTER — Encounter: Payer: Self-pay | Admitting: Occupational Therapy

## 2018-02-14 ENCOUNTER — Ambulatory Visit: Payer: Medicare Other | Admitting: Physical Therapy

## 2018-02-14 ENCOUNTER — Ambulatory Visit: Payer: Medicare Other | Admitting: Occupational Therapy

## 2018-02-14 ENCOUNTER — Encounter: Payer: Self-pay | Admitting: Physical Therapy

## 2018-02-14 DIAGNOSIS — M6281 Muscle weakness (generalized): Secondary | ICD-10-CM

## 2018-02-14 DIAGNOSIS — R2681 Unsteadiness on feet: Secondary | ICD-10-CM

## 2018-02-14 DIAGNOSIS — R41840 Attention and concentration deficit: Secondary | ICD-10-CM | POA: Diagnosis not present

## 2018-02-14 DIAGNOSIS — R41844 Frontal lobe and executive function deficit: Secondary | ICD-10-CM

## 2018-02-14 DIAGNOSIS — R278 Other lack of coordination: Secondary | ICD-10-CM

## 2018-02-14 DIAGNOSIS — R262 Difficulty in walking, not elsewhere classified: Secondary | ICD-10-CM | POA: Diagnosis not present

## 2018-02-14 DIAGNOSIS — R293 Abnormal posture: Secondary | ICD-10-CM | POA: Diagnosis not present

## 2018-02-14 NOTE — Therapy (Signed)
[facility] [address] [place], [place], [address] Phone: [phone]   Fax:  [phone]  Occupational Therapy Treatment  Patient Details  Name: Tara [name] MRN: [id number] Date of Birth: [birth year] Referring Provider: Alysia Penna   Encounter Date: 02/14/2018  OT End of Session - 02/14/18 1252    Visit Number  6    Number of Visits  25    Date for OT Re-Evaluation  04/19/18    Authorization Type  Medicare    Authorization Time Period  goals written for 12 weeks, may d/c after 8 weeks depending on progress    OT Start Time  1146    OT Stop Time  1229    OT Time Calculation (min)  43 min    Activity Tolerance  Patient tolerated treatment well       History reviewed. No pertinent past medical history.  Past Surgical History:  Procedure Laterality Date  . APPLICATION OF CRANIAL NAVIGATION N/A 01/04/2018   Procedure: APPLICATION OF CRANIAL NAVIGATION;  Surgeon: Erline Levine, MD;  Location: [facility];  Service: Neurosurgery;  Laterality: N/A;  . PR DURAL GRAFT REPAIR,SPINE DEFECT Left 01/04/2018   Procedure: Left Pterional craniotomy for biopsy with Brainlab;  Surgeon: Erline Levine, MD;  Location: [facility];  Service: Neurosurgery;  Laterality: Left;  Left Pterional craniotomy for biopsy with Brainlab    There were no vitals filed for this visit.  Subjective Assessment - 02/14/18 1149    Subjective   Its so frustratiing I can't remember word and I sound stupid    Pertinent History   seizure 01/02/2018 ,craniotomy as well as biopsy for brain mass 01/04/2018 per Dr. Vertell Limber, CVA- pt is still awating results of biopsy    Patient Stated Goals  to get back my independence    Currently in Pain?  No/denies                   OT Treatments/Exercises (OP) - 02/14/18 0001      ADLs   ADL Comments  Pt reports that she went to [facility] last week. Pt states they do not think the mass is cancerous. Pt to have  follow up CT next week and then see specialist at [facility]        Cognitive Exercises   Other Cognitive Exercises 1  Pt with score of 26/30 on MOCA - pt with difficulty with confrontational naming and memory (retrieval issue).  Also assessed insight - pt has insight into physical deficits (weakness on R side) and language deficits. Even with cueing pt unable to recognize cognitive issues. Reviwed MOCA and pt able to state "I knew about the language stuff but not so much about the memory. Completed symbol digits modalties test in 5 minutes with 0 errors.  Also completed Trail Making B 100% accuracy.  Discussed results of tasks today - also discussed what pt has been working on in prior OT sessions. Pt stated "I did good with all of the meal planning tasks."  Will continue to work on insight into higher level cognitive deficits.                 OT Short Term Goals - 02/14/18 1249      OT SHORT TERM GOAL #1   Title  I with HEP    Time  4    Period  Weeks    Status  On-going      OT SHORT TERM GOAL #2  Title  Pt will perform basic home management/ cooking with distant supervision demonstrating good safety awareness.    Time  4    Period  Weeks    Status  On-going      OT SHORT TERM GOAL #3   Title  Pt will demonstrate improved fine motor coordination for ADLs as evidenced by decreasing 9 hole peg test score by 3 secs for RUE.    Time  4    Period  Weeks    Status  On-going      OT SHORT TERM GOAL #4   Title  Pt will perform a basic functional organization task with no more than 2 v.c    Time  4    Period  Weeks    Status  On-going      OT SHORT TERM GOAL #5   Title  Pt will demonstrate ability to alternate attention between 2 functional tasks with 80% or better accuracy.    Time  4    Period  Weeks    Status  On-going        OT Long Term Goals - 02/14/18 1249      OT LONG TERM GOAL #1   Title  I with updated HEP.    Time  12    Period  Weeks    Status  New      OT  LONG TERM GOAL #2   Title  Pt will perform mod complex home management and cooking at a modified independent level demonstrating good safety awareness.    Time  12    Period  Weeks    Status  New      OT LONG TERM GOAL #3   Title   Pt will perform a moderate complex organization task independently    Time  12    Period  Weeks    Status  New      OT LONG TERM GOAL #4   Title  Pt will demonstrate ability to divide attention between a physical and cognitive task with 90% or better accuracy.    Time  12    Status  New            Plan - 02/14/18 1250    Clinical Impression Statement  Pt progressing toward goals. Pt with decreased insight into higher level cognitive deficits.     Occupational Profile and client history currently impacting functional performance  Eulogia J Matthewsis a 67 y.o.right handed femalewith history of tobacco abuse on no prescription medications. Pt. has an [employer]. Pt is retired and lives with spouse and was independent prior to admission. Two-level home with bedroom on main level. Spouse works during the day but can stay home as needed    Occupational performance deficits (Please refer to evaluation for details):  IADL's;ADL's;Leisure;Social Participation    Current Impairments/barriers affecting progress:  decreased awareness of deficits - pt to have follow up CT next week and see specialist at [facility]    OT Frequency  2x / week    OT Duration  12 weeks    OT Treatment/Interventions  Self-care/ADL training;Therapeutic exercise;Patient/family education;Neuromuscular education;Moist Heat;Paraffin;Fluidtherapy;Therapist, nutritional;Therapeutic activities;Cognitive remediation/compensation;Passive range of motion;Manual Therapy;DME and/or AE instruction;Ultrasound;Cryotherapy    Plan   alternating attention, environmental scanning with a cognitive component, RUE strength and coordination.    Consulted and Agree with Plan of Care  Patient       Patient will  benefit from skilled therapeutic intervention in order to improve the  following deficits and impairments:  Abnormal gait, Decreased cognition, Impaired vision/preception, Decreased mobility, Decreased coordination, Decreased activity tolerance, Decreased endurance, Decreased range of motion, Decreased strength, Impaired UE functional use, Difficulty walking, Impaired perceived functional ability, Decreased safety awareness, Decreased knowledge of precautions, Decreased balance  Visit Diagnosis: Attention and concentration deficit  Other lack of coordination  Frontal lobe and executive function deficit  Unsteadiness on feet  Muscle weakness (generalized)    Problem List Patient Active Problem List   Diagnosis Date Noted  . 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 Stroke ([facility]) 01/21/2018  . Reactive hypertension   . Hypoalbuminemia due to protein-calorie malnutrition ([facility])   . Seizures ([facility])   . Elevated BUN   . Brain mass 01/06/2018  . Aphasia   . Abnormal CT of the head   . CNS mass 01/01/2018  . Acute metabolic encephalopathy 88/09/314  . Headache 12/31/2017  . Hypertension 12/31/2017  . Hyperglycemia 12/31/2017  . Confusion 12/31/2017    Quay Burow, OTR/L 02/14/2018, 12:53 PM  [facility] [address] [place], [place], [address] Phone: [phone]   Fax:  [phone]  Name: ISHANVI MCQUITTY MRN: [id number] Date of Birth: 02/24/1951

## 2018-02-14 NOTE — Therapy (Signed)
[facility] [address] [place], [place], [address] Phone: [phone]   Fax:  [phone]  Physical Therapy Treatment  Patient Details  Name: Tara [name] MRN: [id number] Date of Birth: 12-Oct-1951 Referring Provider: Alysia Penna   Encounter Date: 02/14/2018  PT End of Session - 02/14/18 1114    Visit Number  6    Number of Visits  13    Date for PT Re-Evaluation  03/20/18    Authorization Type  MCR    Authorization Time Period  01/19/18 to 03/20/18    PT Start Time  1104    PT Stop Time  1145    PT Time Calculation (min)  41 min    Equipment Utilized During Treatment  Gait belt    Activity Tolerance  Patient tolerated treatment well    Behavior During Therapy  [facility] for tasks assessed/performed       History reviewed. No pertinent past medical history.  Past Surgical History:  Procedure Laterality Date  . APPLICATION OF CRANIAL NAVIGATION N/A 01/04/2018   Procedure: APPLICATION OF CRANIAL NAVIGATION;  Surgeon: Erline Levine, MD;  Location: [facility];  Service: Neurosurgery;  Laterality: N/A;  . PR DURAL GRAFT REPAIR,SPINE DEFECT Left 01/04/2018   Procedure: Left Pterional craniotomy for biopsy with Brainlab;  Surgeon: Erline Levine, MD;  Location: [facility];  Service: Neurosurgery;  Laterality: Left;  Left Pterional craniotomy for biopsy with Brainlab    There were no vitals filed for this visit.  Subjective Assessment - 02/14/18 1111    Subjective  Had her consult at [facility] this past Friday. 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 She thought that Johs Hopkis had rulled out Lymphoma. She has an appt at [facility] on Monday for head MRI and then meets with those MD's on Tuesday. [facility] is waiting to follow up with her after discussion with [facility] and her neurologist  (Dr. Mickeal Skinner).     Patient is accompained by:  Family member    Pertinent History  seixure d/o, HTN    Currently in Pain?   No/denies    Pain Score  0-No pain           [facility] Adult PT Treatment/Exercise - 02/14/18 1115      Ambulation/Gait   Ambulation/Gait  Yes    Ambulation/Gait Assistance  5: Supervision    Ambulation/Gait Assistance Details  engaged pt in conversation with enviromental scanning performed as well with no balance loss noted.  foot scuffing noted without balance loss, pt able to correct for a few steps before scuffing returned     Ambulation Distance (Feet)  500 Feet    Assistive device  None    Gait Pattern  Step-through pattern;Decreased arm swing - right;Decreased arm swing - left;Poor foot clearance - left;Poor foot clearance - right    Ambulation Surface  Level;Unlevel;Indoor;Outdoor;Paved      High Level Balance   High Level Balance Activities  Marching forwards;Marching backwards;Tandem walking tandem/toe/heel walking fwd/bwd    High Level Balance Comments  on red/blue mats next to counter top: performed each for 3 laps, intermittent touch to counter for balance assist with min guard to min assist needed for balance.                                Balance Exercises - 02/14/18 1124      Balance Exercises: Standing  Standing Eyes Closed  Narrow base of support (BOS);Wide (BOA);Head turns;Foam/compliant surface;Other reps (comment);30 secs;Limitations    SLS with Vectors  Foam/compliant surface    Rockerboard  Anterior/posterior;Lateral;EO;EC      Balance Exercises: Standing   Standing Eyes Closed Limitations  on air ex in corner with chair in front for safety: narrow base of support with EC no head movements, then with wide base of support EC for head movements left<>right and up<>down x 10 reps each. min guard to min assist with increased sway noted with EC.     SLS with Vectors Limitations  on balance board in ant/posterior direction with 2 tall cones in front: alternating fwd toe taps to cones, progressing to alternating cross toe taps. 10 reps each with min assist, no UE  support. cues to slow down and for weight shifting to assist with balance recovery.     Rebounder Limitations  both ways on balance board: rocking the board with emphasis on tall posture with EO, progressing to EC. no UE support, min guard to min assist for balance (increased assistance needed with lateral directions).            PT Short Term Goals - 01/31/18 1939      PT SHORT TERM GOAL #1   Title  Complete 6 MWT. Update LTG if appropriate. ([facility] 02/04/18)    Baseline  01/31/18 met    Status  Achieved        PT Long Term Goals - 01/31/18 1940      PT LONG TERM GOAL #1   Title  Patient will be independent with HEP and able to verbalize a plan for continued community-based activity upon d/c from PT (Target for all LTGs 03/11/18--due to delayed start by 1.5 wks after evaluation based on census)      PT LONG TERM GOAL #2   Title  Patient will score >=22/30 on FGA to demonstrate improved balance and lesser fall risk.     Baseline  17/30    Time  6    Period  Weeks    Status  New      PT LONG TERM GOAL #3   Title  Patient will improve 6 MWT to 1250 ft, progressing to an appropriate distance for her age to demonstrate improved gait, balance, and endurance.     Baseline  01/31/18 1050 ft (age based norm 1765 ft)    Time  6    Period  Weeks    Status  On-going      PT LONG TERM GOAL #4   Title  Patient will ambulate modified independently 200 ft over unlevel outdoor terrain while engaged in cognitive challenge/dual task activity.      Time  6    Period  Weeks    Status  New         Plan - 02/14/18 1115    Clinical Impression Statement  Today's skilled session continued to focus on dual tasking and high level balance activities. No issues reported. Pt continues to fatigue with activity and be challenged on compliant surfaces. Pt is progressing toward goals and should benefit from continued PT to progress toward unmet goals.     Rehab Potential  Good    Clinical Impairments  Affecting Rehab Potential  ? degree of cognitive deficits (she downplays and ?expressive difficulties make it difficult to discern cognition vs language deficit)    PT Frequency  2x / week    PT Duration  6 weeks  PT Treatment/Interventions  ADLs/Self Care Home Management;Gait training;Cognitive remediation;Neuromuscular re-education;Balance training;Therapeutic exercise;Therapeutic activities;Functional mobility training;Stair training;Patient/family education;Passive range of motion    PT Next Visit Plan   higher level dynamic balance, compliant surfaces including outdoors    Consulted and Agree with Plan of Care  Patient;Family member/caregiver    Family Member Consulted  son       Patient will benefit from skilled therapeutic intervention in order to improve the following deficits and impairments:  Decreased balance, Decreased cognition, Decreased strength, Decreased safety awareness, Difficulty walking, Postural dysfunction  Visit Diagnosis: Difficulty in walking, not elsewhere classified  Muscle weakness (generalized)  Unsteadiness on feet     Problem List Patient Active Problem List   Diagnosis Date Noted  . 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 Stroke ([facility]) 01/21/2018  . Reactive hypertension   . Hypoalbuminemia due to protein-calorie malnutrition ([facility] [place])   . Seizures ([facility] [place])   . Elevated BUN   . Brain mass 01/06/2018  . Aphasia   . Abnormal CT of the head   . CNS mass 01/01/2018  . Acute metabolic encephalopathy 37/34/2876  . Headache 12/31/2017  . Hypertension 12/31/2017  . Hyperglycemia 12/31/2017  . Confusion 12/31/2017    Willow Ora, PTA, [facility] [address] [place],  [id number] [phone] 02/14/18, 4:58 PM  Name: Tara [name] MRN: [id number] Date of Birth: [birth year]

## 2018-02-16 ENCOUNTER — Ambulatory Visit: Payer: Medicare Other | Admitting: Occupational Therapy

## 2018-02-16 ENCOUNTER — Ambulatory Visit: Payer: Medicare Other | Admitting: Physical Therapy

## 2018-02-16 ENCOUNTER — Ambulatory Visit: Payer: Medicare Other | Admitting: Speech Pathology

## 2018-02-16 ENCOUNTER — Encounter: Payer: Self-pay | Admitting: Physical Therapy

## 2018-02-16 VITALS — BP 137/102

## 2018-02-16 VITALS — BP 141/93

## 2018-02-16 DIAGNOSIS — R2681 Unsteadiness on feet: Secondary | ICD-10-CM | POA: Diagnosis not present

## 2018-02-16 DIAGNOSIS — M6281 Muscle weakness (generalized): Secondary | ICD-10-CM

## 2018-02-16 DIAGNOSIS — R41840 Attention and concentration deficit: Secondary | ICD-10-CM

## 2018-02-16 DIAGNOSIS — R4701 Aphasia: Secondary | ICD-10-CM

## 2018-02-16 DIAGNOSIS — R262 Difficulty in walking, not elsewhere classified: Secondary | ICD-10-CM

## 2018-02-16 DIAGNOSIS — R41841 Cognitive communication deficit: Secondary | ICD-10-CM

## 2018-02-16 DIAGNOSIS — R278 Other lack of coordination: Secondary | ICD-10-CM | POA: Diagnosis not present

## 2018-02-16 DIAGNOSIS — R293 Abnormal posture: Secondary | ICD-10-CM | POA: Diagnosis not present

## 2018-02-16 DIAGNOSIS — R41844 Frontal lobe and executive function deficit: Secondary | ICD-10-CM

## 2018-02-16 NOTE — Therapy (Signed)
[facility] [address] [place], [place], [address] Phone: [phone]   Fax:  [phone]  Occupational Therapy Treatment  Patient Details  Name: Tara [name] MRN: [id number] Date of Birth: September 11, 1951 Referring Provider: Alysia Penna   Encounter Date: 02/16/2018  OT End of Session - 02/16/18 1536    Visit Number  7    Number of Visits  25    Date for OT Re-Evaluation  04/19/18    Authorization Time Period  goals written for 12 weeks, may d/c after 8 weeks depending on progress    OT Start Time  1103    OT Stop Time  1145    OT Time Calculation (min)  42 min    Activity Tolerance  Patient tolerated treatment well    Behavior During Therapy  [facility] for tasks assessed/performed       No past medical history on file.  Past Surgical History:  Procedure Laterality Date  . APPLICATION OF CRANIAL NAVIGATION N/A 01/04/2018   Procedure: APPLICATION OF CRANIAL NAVIGATION;  Surgeon: Erline Levine, MD;  Location: [facility];  Service: Neurosurgery;  Laterality: N/A;  . PR DURAL GRAFT REPAIR,SPINE DEFECT Left 01/04/2018   Procedure: Left Pterional craniotomy for biopsy with Brainlab;  Surgeon: Erline Levine, MD;  Location: [facility];  Service: Neurosurgery;  Laterality: Left;  Left Pterional craniotomy for biopsy with Brainlab    Vitals:   02/16/18 1539 02/16/18 1540  BP: (!) 145/100 (!) 137/102         treatment: Pt denies pain today. Reviewed theraband HEP, 15 reps each, min v.c Standing on non compliant surface to copy small peg design for increased RUE fine motor coordination and balance, min v.c, no LOB. BP was checked and it was elevated, pt reports forgetting meds last night. Pt is asymptomatic today.She was instructed to call MD with BP remains elevated. Deductive reasoning task seated at table min v.c                 OT Education - 02/16/18 1539    Education provided  Yes    Education Details  Red  theraband HEP    Person(s) Educated  Patient    Methods  Explanation;Demonstration;Verbal cues    Comprehension  Verbalized understanding;Returned demonstration       OT Short Term Goals - 02/16/18 1104      OT SHORT TERM GOAL #1   Title  I with HEP    Status  Achieved      OT SHORT TERM GOAL #2   Title  Pt will perform basic home management/ cooking with distant supervision demonstrating good safety awareness.    Status  On-going      OT SHORT TERM GOAL #3   Title  Pt will demonstrate improved fine motor coordination for ADLs as evidenced by decreasing 9 hole peg test score by 3 secs for RUE.    Status  On-going      OT SHORT TERM GOAL #4   Title  Pt will perform a basic functional organization task with no more than 2 v.c    Status  Achieved      OT SHORT TERM GOAL #5   Title  Pt will demonstrate ability to alternate attention between 2 functional tasks with 80% or better accuracy.    Status  Achieved        OT Long Term Goals - 02/14/18 1249      OT LONG TERM GOAL #1  Title  I with updated HEP.    Time  12    Period  Weeks    Status  New      OT LONG TERM GOAL #2   Title  Pt will perform mod complex home management and cooking at a modified independent level demonstrating good safety awareness.    Time  12    Period  Weeks    Status  New      OT LONG TERM GOAL #3   Title   Pt will perform a moderate complex organization task independently    Time  12    Period  Weeks    Status  New      OT LONG TERM GOAL #4   Title  Pt will demonstrate ability to divide attention between a physical and cognitive task with 90% or better accuracy.    Time  12    Status  New            Plan - 02/16/18 1537    Clinical Impression Statement  Pt progressing toward goals. Pt demonstrates improved RUE strength and fucntional use.    Occupational performance deficits (Please refer to evaluation for details):  IADL's;ADL's;Leisure;Social Participation    Current  Impairments/barriers affecting progress:  decreased awareness of deficits - pt to have follow up CT next week and see specialist at [facility]    OT Frequency  2x / week    OT Duration  12 weeks    OT Treatment/Interventions  Self-care/ADL training;Therapeutic exercise;Patient/family education;Neuromuscular education;Moist Heat;Paraffin;Fluidtherapy;Therapist, nutritional;Therapeutic activities;Cognitive remediation/compensation;Passive range of motion;Manual Therapy;DME and/or AE instruction;Ultrasound;Cryotherapy    Plan   check short term goals, alternating attention, environmental scanning with a cognitive component,     Consulted and Agree with Plan of Care  Patient       Patient will benefit from skilled therapeutic intervention in order to improve the following deficits and impairments:  Abnormal gait, Decreased cognition, Impaired vision/preception, Decreased mobility, Decreased coordination, Decreased activity tolerance, Decreased endurance, Decreased range of motion, Decreased strength, Impaired UE functional use, Difficulty walking, Impaired perceived functional ability, Decreased safety awareness, Decreased knowledge of precautions, Decreased balance  Visit Diagnosis: Muscle weakness (generalized)  Attention and concentration deficit  Other lack of coordination  Frontal lobe and executive function deficit    Problem List Patient Active Problem List   Diagnosis Date Noted  . 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 Stroke ([facility] [place]) 01/21/2018  . Reactive hypertension   . Hypoalbuminemia due to protein-calorie malnutrition ([place] [facility])   . Seizures ([place])   . Elevated BUN   . Brain mass 01/06/2018  . Aphasia   . Abnormal CT of the head   . CNS mass 01/01/2018  . Acute metabolic encephalopathy 17/51/0258  . Headache 12/31/2017  . Hypertension 12/31/2017  . Hyperglycemia 12/31/2017  .  Confusion 12/31/2017    Pellegrino Kennard 02/16/2018, 3:40 PM  [facility] [address] [place], [place], [address] Phone: [phone]   Fax:  [phone]  Name: Tara [name] MRN: [id number] Date of Birth: [birth year]

## 2018-02-16 NOTE — Therapy (Signed)
[facility] [address] [place], [place], [address] Phone: [phone]   Fax:  [phone]  Speech Language Pathology Treatment  Patient Details  Name: Tara [name] MRN: [id number] Date of Birth: 1951/04/17 Referring Provider: Dr. Alysia Penna   Encounter Date: 02/16/2018  End of Session - 02/16/18 1319    Visit Number  4    Number of Visits  17    Date for SLP Re-Evaluation  03/18/18    SLP Start Time  1146    SLP Stop Time   1229    SLP Time Calculation (min)  43 min    Activity Tolerance  Patient tolerated treatment well       No past medical history on file.  Past Surgical History:  Procedure Laterality Date  . APPLICATION OF CRANIAL NAVIGATION N/A 01/04/2018   Procedure: APPLICATION OF CRANIAL NAVIGATION;  Surgeon: Erline Levine, MD;  Location: [facility];  Service: Neurosurgery;  Laterality: N/A;  . PR DURAL GRAFT REPAIR,SPINE DEFECT Left 01/04/2018   Procedure: Left Pterional craniotomy for biopsy with Brainlab;  Surgeon: Erline Levine, MD;  Location: [facility];  Service: Neurosurgery;  Laterality: Left;  Left Pterional craniotomy for biopsy with Brainlab    There were no vitals filed for this visit.  Subjective Assessment - 02/16/18 1149    Subjective  "They think it's some kind of autoimmune thing."    Currently in Pain?  No/denies            ADULT SLP TREATMENT - 02/16/18 1148      General Information   Behavior/Cognition  Alert;Cooperative;Pleasant mood    Patient Positioning  Upright in chair      Treatment Provided   Treatment provided  Cognitive-Linquistic      Pain Assessment   Pain Assessment  No/denies pain      Cognitive-Linquistic Treatment   Treatment focused on  Cognition    Skilled Treatment  Pt showed SLP some thank you notes she wrote as assigned; she has also been taking daily notes taken in her planner. She reported improved recall using this strategy. Pt reported  frustration with instances of wordfinding difficulties in conversations. SLP worked with pt on using descriptions in mod complex naming tasks. Pt required occasional min A questioning cues for specificity and to improve fluidity of descriptions. SLP suggested word games pt could play with family during her upcoming trip to the beach to practice her description. SLP worked with pt on ways to use memory strategies to help with recall of names of people in her book club, focusing on writing and association.      Assessment / Recommendations / Plan   Plan  Continue with current plan of care      Progression Toward Goals   Progression toward goals  Progressing toward goals       SLP Education - 02/16/18 1318    Education provided  Yes    Education Details  use writing, associations to help with recall of names    Person(s) Educated  Patient    Methods  Explanation    Comprehension  Verbalized understanding;Need further instruction       SLP Short Term Goals - 02/16/18 1152      SLP SHORT TERM GOAL #1   Title  Pt will complete moderately complex naming tasks with occasional min A over 3 sessions.    Status  Deferred      SLP SHORT TERM GOAL #2   Title  Pt will utilize compensations for aphasia in structured naming tasks 4/5 opportunites with occasional min A over 2 sessions    Status  Deferred      SLP SHORT TERM GOAL #3   Title  Pt will utiltize compensations for aphasia over 10 minute modconversation 3/5 opportunites with occasional min A oer 2 sessions    Status  Deferred      SLP SHORT TERM GOAL #4   Title  Written expression/reading comprehension assessment if indicated    Status  Achieved      SLP SHORT TERM GOAL #5   Title  pt will tell SLP/indicate 4 memory strategies she could use to improve memory over two sessions    Time  3    Period  Weeks    Status  On-going       SLP Long Term Goals - 02/16/18 1157      SLP LONG TERM GOAL #1   Title  Pt will complete complex  naming tasks with rare min A over 3 sessions    Status  Deferred      SLP LONG TERM GOAL #2   Title  Pt will utilize compensations for aphasia over 15 minute complex conversation with rare min A over 2 sessions.    Time  10    Period  Weeks    Status  On-going      SLP LONG TERM GOAL #3   Title  pt will utilize at least one memory strategy in 5 therapy sessions    Time  7    Status  On-going       Plan - 02/16/18 1319    Clinical Impression Statement  Today pt presents with WFL/WNL language ability with moderately complex information; she does report frustration with wordfinding difficulties in conversation, and SLP worked with pt today on using description as compensation. SLP agrees that further cognitive testing would be appropriate given functional deficits noted in attention, memory today.  I recommend skilled ST to maximize compensations for mild verbal expression deficits and for improved cognitive linguistic skills to promote incr'd independence and QOL.     Speech Therapy Frequency  2x / week    Treatment/Interventions  Cognitive reorganization;Functional tasks;Compensatory strategies;SLP instruction and feedback;Patient/family education;Internal/external aids;Environmental controls;Cueing hierarchy    Potential to Achieve Goals  Good    Consulted and Agree with Plan of Care  Patient       Patient will benefit from skilled therapeutic intervention in order to improve the following deficits and impairments:   Aphasia  Cognitive communication deficit    Problem List Patient Active Problem List   Diagnosis Date Noted  . 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 Stroke ([facility]) 01/21/2018  . Reactive hypertension   . Hypoalbuminemia due to protein-calorie malnutrition ([place])   . Seizures ([place])   . Elevated BUN   . Brain mass 01/06/2018  . Aphasia   . Abnormal CT of the head   .  CNS mass 01/01/2018  . Acute metabolic encephalopathy 40/98/1191  . Headache 12/31/2017  . Hypertension 12/31/2017  . Hyperglycemia 12/31/2017  . Confusion 12/31/2017   Deneise Lever, [place], [place] 02/16/2018, 1:22 PM  [facility] [address] [place], [place], [address] Phone: [phone]   Fax:  [phone]   Name: Tara [name] MRN: [id number] Date of Birth: May 24, 1951

## 2018-02-16 NOTE — Therapy (Signed)
[facility] [address] [place], [place], [address] Phone: [phone]   Fax:  [phone]  Physical Therapy Treatment  Patient Details  Name: Tara [name] MRN: [id number] Date of Birth: 02-24-1951 Referring Provider: Alysia Penna   Encounter Date: 02/16/2018  PT End of Session - 02/16/18 1021    Visit Number  7    Number of Visits  13    Date for PT Re-Evaluation  03/20/18    Authorization Type  MCR    Authorization Time Period  01/19/18 to 03/20/18    PT Start Time  1018    PT Stop Time  1058    PT Time Calculation (min)  40 min    Equipment Utilized During Treatment  Gait belt    Activity Tolerance  Patient tolerated treatment well    Behavior During Therapy  [facility] for tasks assessed/performed       History reviewed. No pertinent past medical history.  Past Surgical History:  Procedure Laterality Date  . APPLICATION OF CRANIAL NAVIGATION N/A 01/04/2018   Procedure: APPLICATION OF CRANIAL NAVIGATION;  Surgeon: Erline Levine, MD;  Location: [facility];  Service: Neurosurgery;  Laterality: N/A;  . PR DURAL GRAFT REPAIR,SPINE DEFECT Left 01/04/2018   Procedure: Left Pterional craniotomy for biopsy with Brainlab;  Surgeon: Erline Levine, MD;  Location: [facility];  Service: Neurosurgery;  Laterality: Left;  Left Pterional craniotomy for biopsy with Brainlab    There were no vitals filed for this visit.  Subjective Assessment - 02/16/18 1020    Subjective  No new complaints. No falls or pain to report.     Pertinent History  seixure d/o, HTN    Patient Stated Goals  Be able to help my daughter with her baby ( due in one month, [place], [place])    Currently in Pain?  No/denies           [facility] Adult PT Treatment/Exercise - 02/16/18 1022      Transfers   Transfers  Sit to Stand    Sit to Stand  7: Independent    Stand to Sit  7: Independent      Ambulation/Gait   Ambulation/Gait  Yes    Ambulation/Gait  Assistance  5: Supervision    Ambulation/Gait Assistance Details  pt engaged in conversation through out, along with envriomental scanning. no foot scuffing noted today. Mild dyspnea after today's distance.     Ambulation Distance (Feet)  1000 Feet    Assistive device  None    Gait Pattern  Step-through pattern;Decreased stride length    Ambulation Surface  Level;Indoor;Unlevel;Outdoor;Paved          Balance Exercises - 02/16/18 1046      Balance Exercises: Standing   Standing Eyes Closed  Narrow base of support (BOS);Head turns;Foam/compliant surface;Other reps (comment);30 secs;Limitations    Balance Beam  standing across blue faom beam with no UE support: alternating fwd heel taps x10 reps each leg, then alternating bwd toe taps x 10 each. min to mod assist for balance, improved with cues posture, step length and step height.     Sit to Stand Time  with feet across blue foam beam: sit<>stands with no UE support, cues for full standing and slow, controlled descent with sitting down.        Balance Exercises: Standing   Standing Eyes Closed Limitations  on blue mat over ramp, performed both facing up and down ramp with feet together: EC no head movements, progressing  to EC head movements left<>right, up<>down and diagonals both ways. min guard to min assist as pt fatigued with increased sway/LE muscle tremors noted with fatigue. seated rest break between facing up and down ramp.                              PT Short Term Goals - 01/31/18 1939      PT SHORT TERM GOAL #1   Title  Complete 6 MWT. Update LTG if appropriate. ([facility] 02/04/18)    Baseline  01/31/18 met    Status  Achieved        PT Long Term Goals - 01/31/18 1940      PT LONG TERM GOAL #1   Title  Patient will be independent with HEP and able to verbalize a plan for continued community-based activity upon d/c from PT ([facility] for all LTGs 03/11/18--due to delayed start by 1.5 wks after evaluation based on census)       PT LONG TERM GOAL #2   Title  Patient will score >=22/30 on FGA to demonstrate improved balance and lesser fall risk.     Baseline  17/30    Time  6    Period  Weeks    Status  New      PT LONG TERM GOAL #3   Title  Patient will improve 6 MWT to 1250 ft, progressing to an appropriate distance for her age to demonstrate improved gait, balance, and endurance.     Baseline  01/31/18 1050 ft (age based norm 1765 ft)    Time  6    Period  Weeks    Status  On-going      PT LONG TERM GOAL #4   Title  Patient will ambulate modified independently 200 ft over unlevel outdoor terrain while engaged in cognitive challenge/dual task activity.      Time  6    Period  Weeks    Status  New            Plan - 02/16/18 1022    Clinical Impression Statement  Today's skilled session continued with emphasis on gait with dual/multi tasking and high level balance activities. Pt fatigued with increased activity, needing short seated rest breaks. Pt continues to progress toward unmet goals and should benefit from continued PT.     Rehab Potential  Good    Clinical Impairments Affecting Rehab Potential  ? degree of cognitive deficits (she downplays and ?expressive difficulties make it difficult to discern cognition vs language deficit)    PT Frequency  2x / week    PT Duration  6 weeks    PT Treatment/Interventions  ADLs/Self Care Home Management;Gait training;Cognitive remediation;Neuromuscular re-education;Balance training;Therapeutic exercise;Therapeutic activities;Functional mobility training;Stair training;Patient/family education;Passive range of motion    PT Next Visit Plan   higher level dynamic balance, compliant surfaces including outdoors    Consulted and Agree with Plan of Care  Patient;Family member/caregiver    Family Member Consulted  son       Patient will benefit from skilled therapeutic intervention in order to improve the following deficits and impairments:  Decreased balance, Decreased  cognition, Decreased strength, Decreased safety awareness, Difficulty walking, Postural dysfunction  Visit Diagnosis: Unsteadiness on feet  Muscle weakness (generalized)  Difficulty in walking, not elsewhere classified     Problem List Patient Active Problem List   Diagnosis Date Noted  . Monoplegia of arm after cerebral infarct affecting right  dominant side ([place]) 01/27/2018  . 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 Stroke ([facility]) 01/21/2018  . Reactive hypertension   . Hypoalbuminemia due to protein-calorie malnutrition ([place])   . Seizures ([place])   . Elevated BUN   . Brain mass 01/06/2018  . Aphasia   . Abnormal CT of the head   . CNS mass 01/01/2018  . Acute metabolic encephalopathy 81/27/5170  . Headache 12/31/2017  . Hypertension 12/31/2017  . Hyperglycemia 12/31/2017  . Confusion 12/31/2017    Willow Ora, PTA, [facility] [address] [place], [place] [address] [phone] 02/16/18, 11:08 AM   Name: Tara [name] MRN: [id number] Date of Birth: 03-15-1951

## 2018-02-18 ENCOUNTER — Encounter: Payer: Medicare Other | Admitting: Speech Pathology

## 2018-02-21 ENCOUNTER — Ambulatory Visit (HOSPITAL_COMMUNITY): Admission: RE | Admit: 2018-02-21 | Payer: Medicare Other | Source: Ambulatory Visit

## 2018-03-01 ENCOUNTER — Ambulatory Visit: Payer: Medicare Other

## 2018-03-01 ENCOUNTER — Encounter: Payer: Self-pay | Admitting: Occupational Therapy

## 2018-03-01 ENCOUNTER — Ambulatory Visit: Payer: Medicare Other | Admitting: Occupational Therapy

## 2018-03-01 ENCOUNTER — Ambulatory Visit (HOSPITAL_COMMUNITY)
Admission: RE | Admit: 2018-03-01 | Discharge: 2018-03-01 | Disposition: A | Discharge status: Home / Self Care | Payer: Medicare Other | Source: Ambulatory Visit | Attending: Internal Medicine | Admitting: Internal Medicine

## 2018-03-01 ENCOUNTER — Encounter: Payer: Self-pay | Admitting: Physical Therapy

## 2018-03-01 ENCOUNTER — Ambulatory Visit: Payer: Medicare Other | Admitting: Physical Therapy

## 2018-03-01 DIAGNOSIS — G9389 Other specified disorders of brain: Secondary | ICD-10-CM | POA: Insufficient documentation

## 2018-03-01 DIAGNOSIS — R262 Difficulty in walking, not elsewhere classified: Secondary | ICD-10-CM

## 2018-03-01 DIAGNOSIS — R41840 Attention and concentration deficit: Secondary | ICD-10-CM

## 2018-03-01 DIAGNOSIS — R278 Other lack of coordination: Secondary | ICD-10-CM

## 2018-03-01 DIAGNOSIS — R41844 Frontal lobe and executive function deficit: Secondary | ICD-10-CM

## 2018-03-01 DIAGNOSIS — I6389 Other cerebral infarction: Secondary | ICD-10-CM | POA: Diagnosis not present

## 2018-03-01 DIAGNOSIS — R531 Weakness: Secondary | ICD-10-CM | POA: Diagnosis not present

## 2018-03-01 DIAGNOSIS — R41841 Cognitive communication deficit: Secondary | ICD-10-CM

## 2018-03-01 DIAGNOSIS — R293 Abnormal posture: Secondary | ICD-10-CM | POA: Diagnosis not present

## 2018-03-01 DIAGNOSIS — R4701 Aphasia: Secondary | ICD-10-CM

## 2018-03-01 DIAGNOSIS — M6281 Muscle weakness (generalized): Secondary | ICD-10-CM

## 2018-03-01 DIAGNOSIS — R2681 Unsteadiness on feet: Secondary | ICD-10-CM

## 2018-03-01 MED ORDER — GADOBENATE DIMEGLUMINE 529 MG/ML IV SOLN
20.0000 mL | Freq: Once | INTRAVENOUS | Status: AC | PRN
  Administered 2018-03-01: 20 mL via INTRAVENOUS

## 2018-03-01 NOTE — Patient Instructions (Signed)
  Please complete the assigned speech therapy homework prior to your next session and return it to the speech therapist at your next visit.  

## 2018-03-01 NOTE — Therapy (Signed)
[facility] [address] [place], [place], [address] Phone: [phone]   Fax:  [phone]  Occupational Therapy Treatment  Patient Details  Name: Tara [name] MRN: [id number] Date of Birth: 1951-04-27 Referring Provider: Alysia Penna   Encounter Date: 03/01/2018  OT End of Session - 03/01/18 1027    Visit Number  8    Number of Visits  25    Date for OT Re-Evaluation  04/19/18    Authorization Type  Medicare    Authorization Time Period  goals written for 12 weeks, may d/c after 8 weeks depending on progress    OT Start Time  1020    OT Stop Time  1100    OT Time Calculation (min)  40 min       History reviewed. No pertinent past medical history.  Past Surgical History:  Procedure Laterality Date  . APPLICATION OF CRANIAL NAVIGATION N/A 01/04/2018   Procedure: APPLICATION OF CRANIAL NAVIGATION;  Surgeon: Erline Levine, MD;  Location: [facility];  Service: Neurosurgery;  Laterality: N/A;  . PR DURAL GRAFT REPAIR,SPINE DEFECT Left 01/04/2018   Procedure: Left Pterional craniotomy for biopsy with Brainlab;  Surgeon: Erline Levine, MD;  Location: [facility];  Service: Neurosurgery;  Laterality: Left;  Left Pterional craniotomy for biopsy with Brainlab    There were no vitals filed for this visit.  Subjective Assessment - 03/01/18 1024    Subjective   Pt reports she had a great time at the beach    Pertinent History   seizure 01/02/2018 ,craniotomy as well as biopsy for brain mass 01/04/2018 per Dr. Vertell Limber, CVA- pt is still awating results of biopsy    Patient Stated Goals  to get back my independence    Currently in Pain?  No/denies                Treatment: Basic cooking task to cook a grilled cheese sandwich, pt located items and performed task safely and independently. Grooved pegboard for increased RUE fine motor coordination, min v.c, improved performance. Multi tasking, ambulating while tossing a  ball and performing category generation, min v.c and no LOB Pt met 9 hole peg test goal, see below.              OT Short Term Goals - 03/01/18 1028      OT SHORT TERM GOAL #1   Title  I with HEP    Status  Achieved      OT SHORT TERM GOAL #2   Title  Pt will perform basic home management/ cooking with distant supervision demonstrating good safety awareness.    Status  Achieved      OT SHORT TERM GOAL #3   Title  Pt will demonstrate improved fine motor coordination for ADLs as evidenced by decreasing 9 hole peg test score by 3 secs for RUE.    Status  Achieved 23.25 sec      OT SHORT TERM GOAL #4   Title  Pt will perform a basic functional organization task with no more than 2 v.c    Status  Achieved      OT SHORT TERM GOAL #5   Title  Pt will demonstrate ability to alternate attention between 2 functional tasks with 80% or better accuracy.    Status  Achieved        OT Long Term Goals - 03/01/18 1043      OT LONG TERM GOAL #1   Title  I with updated HEP.    Time  12    Period  Weeks    Status  On-going      OT LONG TERM GOAL #2   Title  Pt will perform mod complex home management and cooking at a modified independent level demonstrating good safety awareness.    Time  12    Period  Weeks    Status  Achieved      OT LONG TERM GOAL #3   Title   Pt will perform a moderate complex organization task independently    Time  12    Period  Weeks    Status  Achieved      OT LONG TERM GOAL #4   Title  Pt will demonstrate ability to divide attention between a physical and cognitive task with 90% or better accuracy.    Time  12    Status  On-going            Plan - 03/01/18 1052    Clinical Impression Statement  Pt progressing toward goals. Pt demonstrates ability to perform basic cooking at a modified indpendent level in clinic demonstrating good safety safety awareness.    Occupational performance deficits (Please refer to evaluation for details):   IADL's;ADL's;Leisure;Social Participation    Rehab Potential  Good    Current Impairments/barriers affecting progress:  decreased awareness of deficits - pt to have follow up CT next week and see specialist [facility]    OT Frequency  2x / week    OT Duration  12 weeks    OT Treatment/Interventions  Self-care/ADL training;Therapeutic exercise;Patient/family education;Neuromuscular education;Moist Heat;Paraffin;Fluidtherapy;Therapist, nutritional;Therapeutic activities;Cognitive remediation/compensation;Passive range of motion;Manual Therapy;DME and/or AE instruction;Ultrasound;Cryotherapy    Plan   check short term goals, alternating attention, environmental scanning with a cognitive component,     Consulted and Agree with Plan of Care  Patient       Patient will benefit from skilled therapeutic intervention in order to improve the following deficits and impairments:  Abnormal gait, Decreased cognition, Impaired vision/preception, Decreased mobility, Decreased coordination, Decreased activity tolerance, Decreased endurance, Decreased range of motion, Decreased strength, Impaired UE functional use, Difficulty walking, Impaired perceived functional ability, Decreased safety awareness, Decreased knowledge of precautions, Decreased balance  Visit Diagnosis: Muscle weakness (generalized)  Attention and concentration deficit  Other lack of coordination  Frontal lobe and executive function deficit    Problem List Patient Active Problem List   Diagnosis Date Noted  . 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 Stroke ([place]) 01/21/2018  . Reactive hypertension   . Hypoalbuminemia due to protein-calorie malnutrition ([place])   . Seizures ([place])   . Elevated BUN   . Brain mass 01/06/2018  . Aphasia   . Abnormal CT of the head   . CNS mass 01/01/2018  . Acute metabolic encephalopathy 70/76/1518  .  Headache 12/31/2017  . Hypertension 12/31/2017  . Hyperglycemia 12/31/2017  . Confusion 12/31/2017    Jihan Mellette 03/01/2018, 1:01 PM  [facility] [address] [place], [place], [address] Phone: [phone]   Fax:  [phone]  Name: Tara [name] MRN: [id number] Date of Birth: 1951-01-13

## 2018-03-01 NOTE — Therapy (Signed)
[facility] [address] [place], [place], [address] Phone: [phone]   Fax:  [phone]  Physical Therapy Treatment  Patient Details  Name: Tara [name] MRN: [id number] Date of Birth: 04/10/1951 Referring Provider: Alysia Penna   Encounter Date: 03/01/2018  PT End of Session - 03/01/18 1109    Visit Number  8    Number of Visits  13    Date for PT Re-Evaluation  03/20/18    Authorization Type  MCR    Authorization Time Period  01/19/18 to 03/20/18    PT Start Time  1102    PT Stop Time  1142    PT Time Calculation (min)  40 min    Equipment Utilized During Treatment  Gait belt    Activity Tolerance  Patient tolerated treatment well    Behavior During Therapy  [facility] for tasks assessed/performed       History reviewed. No pertinent past medical history.  Past Surgical History:  Procedure Laterality Date  . APPLICATION OF CRANIAL NAVIGATION N/A 01/04/2018   Procedure: APPLICATION OF CRANIAL NAVIGATION;  Surgeon: Erline Levine, MD;  Location: [facility];  Service: Neurosurgery;  Laterality: N/A;  . PR DURAL GRAFT REPAIR,SPINE DEFECT Left 01/04/2018   Procedure: Left Pterional craniotomy for biopsy with Brainlab;  Surgeon: Erline Levine, MD;  Location: [facility];  Service: Neurosurgery;  Laterality: Left;  Left Pterional craniotomy for biopsy with Brainlab    There were no vitals filed for this visit.  Subjective Assessment - 03/01/18 1106    Subjective  Had a great week at the beach, was able to walk on the loose/hard sand without issues. Reports [facility] has cancelled as they have not reviewed her charts as yet and the MD was not ready for her. As a result they moved her MRI to today. [facility] is to call her back to reschedule. No falls or pain to report.     Pertinent History  seixure d/o, HTN    Patient Stated Goals  Be able to help my daughter with her baby ( due in one month, [place], [place])    Currently in Pain?   No/denies    Pain Score  0-No pain          [facility] Adult PT Treatment/Exercise - 03/01/18 1110      Transfers   Transfers  Sit to Stand    Sit to Stand  7: Independent    Stand to Sit  7: Independent      Ambulation/Gait   Ambulation/Gait  Yes    Ambulation/Gait Assistance  5: Supervision;4: Min guard    Ambulation/Gait Assistance Details  had pt engaged in converstation about her beach trip while negotiationg over various outdoor surfaces at supervision level, switched to more challenging cognitive task of naming any [place] from [employer] w with pt needing cues for names ~50% of them, all with min guard assist and no balance issues noted.                              Ambulation Distance (Feet)  1200 Feet or more    Assistive device  None    Gait Pattern  Step-through pattern;Decreased stride length    Ambulation Surface  Level;Indoor      High Level Balance   High Level Balance Activities  Marching forwards;Marching backwards;Tandem walking    High Level Balance Comments  on gravel outdoors: fwd/bwd marching, tandem  gait fwd/bwd x 3 laps each with min assist for balance, cues on posture/ex form and to slow down for improved balance reactions.       Neuro Re-ed    Neuro Re-ed Details   for balance/strengthening: one foot on floor/other forward on airex bouncing ball off wall and catching it while naming foods a-l with right foot on airex, m-z with left foot on airex. cues to keep on task of bouncing the ball and cues to foods ~40%. min guard to min assist for balance.           Balance Exercises - 03/01/18 1130      Balance Exercises: Standing   Rockerboard  Anterior/posterior;Head turns;EO;EC;30 seconds;10 reps      Balance Exercises: Standing   Rebounder Limitations  on rockerboard in ant/post direction: EO rocking board with emphasis on tall posture, progressing to EC rocking board with emphasis on tall posture. no UE support with min guard assist and occasional touch to  wall/chair for balance. holding the board steady: EC no head movements, progressing to EC head movements up<>down and left to right. min guard to min assist with cues on core stability and weight shifitng to assist with balance recovery.           PT Short Term Goals - 01/31/18 1939      PT SHORT TERM GOAL #1   Title  Complete 6 MWT. Update LTG if appropriate. (Target 02/04/18)    Baseline  01/31/18 met    Status  Achieved        PT Long Term Goals - 01/31/18 1940      PT LONG TERM GOAL #1   Title  Patient will be independent with HEP and able to verbalize a plan for continued community-based activity upon d/c from PT (Target for all LTGs 03/11/18--due to delayed start by 1.5 wks after evaluation based on census)      PT LONG TERM GOAL #2   Title  Patient will score >=22/30 on FGA to demonstrate improved balance and lesser fall risk.     Baseline  17/30    Time  6    Period  Weeks    Status  New      PT LONG TERM GOAL #3   Title  Patient will improve 6 MWT to 1250 ft, progressing to an appropriate distance for her age to demonstrate improved gait, balance, and endurance.     Baseline  01/31/18 1050 ft (age based norm 1765 ft)    Time  6    Period  Weeks    Status  On-going      PT LONG TERM GOAL #4   Title  Patient will ambulate modified independently 200 ft over unlevel outdoor terrain while engaged in cognitive challenge/dual task activity.      Time  6    Period  Weeks    Status  New            Plan - 03/01/18 1109    Clinical Impression Statement  Today's skilled session continued to focus on multi tasking and balance on compliant surfaces. Pt demo'd no balance issues on outdoor surfaces with gait during cognitive tasks, however continues to be challenged by dynamic balance tasks on complaint surfaces. Pt is progressing toward goals and appears to be on target to meet goals when they are due in May.  Rehab Potential  Good    Clinical  Impairments Affecting Rehab Potential  ? degree of cognitive deficits (she downplays and ?expressive difficulties make it difficult to discern cognition vs language deficit)    PT Frequency  2x / week    PT Duration  6 weeks    PT Treatment/Interventions  ADLs/Self Care Home Management;Gait training;Cognitive remediation;Neuromuscular re-education;Balance training;Therapeutic exercise;Therapeutic activities;Functional mobility training;Stair training;Patient/family education;Passive range of motion    PT Next Visit Plan   higher level dynamic balance, compliant surfaces including outdoors    Consulted and Agree with Plan of Care  Patient       Patient will benefit from skilled therapeutic intervention in order to improve the following deficits and impairments:  Decreased balance, Decreased cognition, Decreased strength, Decreased safety awareness, Difficulty walking, Postural dysfunction  Visit Diagnosis: Muscle weakness (generalized)  Unsteadiness on feet  Difficulty in walking, not elsewhere classified  Abnormal posture     Problem List Patient Active Problem List   Diagnosis Date Noted  . 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 Stroke ([place]) 01/21/2018  . Reactive hypertension   . Hypoalbuminemia due to protein-calorie malnutrition ([place])   . Seizures ([place])   . Elevated BUN   . Brain mass 01/06/2018  . Aphasia   . Abnormal CT of the head   . CNS mass 01/01/2018  . Acute metabolic encephalopathy 91/47/8295  . Headache 12/31/2017  . Hypertension 12/31/2017  . Hyperglycemia 12/31/2017  . Confusion 12/31/2017    Willow Ora, PTA, [facility] [address], [place], [place] [address] [phone] 03/01/18, 9:35 PM   Name: Tara [name] MRN: [id number] Date of Birth: [birth year]

## 2018-03-01 NOTE — Therapy (Signed)
[facility] [address] [place], [place], [address] Phone: [phone]   Fax:  [phone]  Speech Language Pathology Treatment  Patient Details  Name: Tara [name] MRN: [id number] Date of Birth: 1951/04/11 Referring Provider: Dr. Alysia Penna   Encounter Date: 03/01/2018  End of Session - 03/01/18 1712    Visit Number  5    Number of Visits  17    Date for SLP Re-Evaluation  03/18/18    SLP Start Time  0936    SLP Stop Time   1016    SLP Time Calculation (min)  40 min    Activity Tolerance  Patient tolerated treatment well       No past medical history on file.  Past Surgical History:  Procedure Laterality Date  . APPLICATION OF CRANIAL NAVIGATION N/A 01/04/2018   Procedure: APPLICATION OF CRANIAL NAVIGATION;  Surgeon: Erline Levine, MD;  Location: [facility];  Service: Neurosurgery;  Laterality: N/A;  . PR DURAL GRAFT REPAIR,SPINE DEFECT Left 01/04/2018   Procedure: Left Pterional craniotomy for biopsy with Brainlab;  Surgeon: Erline Levine, MD;  Location: [facility];  Service: Neurosurgery;  Laterality: Left;  Left Pterional craniotomy for biopsy with Brainlab    There were no vitals filed for this visit.  Subjective Assessment - 03/01/18 0942    Subjective  "[facility] thought it was an autoimmune thing, and I'm going to [facility] to the brain tumor clinic there - but they haven't looked at anything yet."  "My kids are doing (meds and appointments) for me - I am just sitting back and letting them do it. It drives me crazy."    Currently in Pain?  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 SLP educated pt re: wordfinding strategies and asked pt 10 minutes later - she described each with [facility] details. Pt possibly perseverative today with frustration re: lack of definitive diagnosis. She stated she was unsure of what different entities require to move ahead with her case - SLP told pt to ask her son and write down details to bring to [facility] next time. Pt again with WNL language with SLP for entire session.      Assessment / Recommendations / Plan   Plan  Continue with current plan of care      Progression Toward Goals   Progression toward goals  Progressing toward goals       SLP Education - 03/01/18 1712    Education provided  Yes    Education Details  anomia compenstations    Person(s) Educated  Patient    Methods  Explanation    Comprehension  Verbalized understanding       SLP Short Term Goals - 03/01/18 1716      SLP SHORT TERM GOAL #1   Title  Pt will complete moderately complex naming tasks with occasional min A over 3 sessions.    Status  Deferred      SLP SHORT TERM GOAL #2   Title  Pt will utilize compensations for aphasia in structured naming tasks 4/5 opportunites with  occasional min A over 2 sessions    Status  Deferred      SLP SHORT TERM GOAL #3   Title  Pt will utiltize compensations for aphasia over 10 minute modconversation 3/5 opportunites with occasional min A oer 2 sessions    Status  Deferred      SLP SHORT TERM GOAL #4   Title  Written expression/reading comprehension assessment if indicated    Status  Achieved      SLP SHORT TERM GOAL #5   Title  pt will tell SLP/indicate 4 memory strategies she could use to improve memory over two sessions    Time  2    Period  Weeks    Status  On-going       SLP Long Term Goals - 03/01/18 1716      SLP LONG TERM GOAL #1   Title  Pt will complete complex naming tasks with rare min A over 3 sessions    Status  Deferred      SLP LONG TERM GOAL #2   Title  Pt will utilize compensations for aphasia  over 15 minute complex conversation with rare min A over 2 sessions.    Time  10    Period  Weeks    Status  On-going      SLP LONG TERM GOAL #3   Title  pt will utilize at least one memory strategy in 5 therapy sessions    Time  6    Status  On-going       Plan - 03/01/18 1712    Clinical Impression Statement  Pt again presents with WNL language ability with moderately complex information; she continues to report frustration with wordfinding difficulties in conversation approx x3/day. Pt functionally described compensations after a delay. Pt reports her memory seems at baseline to her at this time. Pt has been seen for four speech therapy sessions since her evaluation 5 weeks ago (x2/week recommended), which is suboptimal frequency for progress in ST. Pt's schedule looks to be more standardized at x2/week at this time. I recommend skilled ST to maximize compensations for mild verbal expression deficits and for improved cognitive linguistic skills to promote incr'd independence and QOL.     Speech Therapy Frequency  2x / week    Treatment/Interventions  Cognitive reorganization;Functional tasks;Compensatory strategies;SLP instruction and feedback;Patient/family education;Internal/external aids;Environmental controls;Cueing hierarchy    Potential to Achieve Goals  Good    Consulted and Agree with Plan of Care  Patient       Patient will benefit from skilled therapeutic intervention in order to improve the following deficits and impairments:   Cognitive communication deficit  Aphasia    Problem List Patient Active Problem List   Diagnosis Date Noted  . 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 Stroke ([place]) 01/21/2018  . Reactive hypertension   . Hypoalbuminemia due to protein-calorie malnutrition ([place])   . Seizures ([place])   . Elevated BUN   . Brain mass 01/06/2018  . Aphasia   . Abnormal  CT of the head   . CNS mass 01/01/2018  . Acute metabolic encephalopathy 95/18/8416  . Headache 12/31/2017  . Hypertension 12/31/2017  . Hyperglycemia 12/31/2017  . Confusion 12/31/2017    [facility] ,[place], CCC-SLP  03/01/2018, 5:18 PM  [facility] [address] [place], [place], [address] Phone: [phone]   Fax:  [phone]   Name:  Tara [name] MRN: [id number] Date of Birth: 11/08/1950

## 2018-03-04 ENCOUNTER — Encounter: Payer: Self-pay | Admitting: Physical Therapy

## 2018-03-04 ENCOUNTER — Ambulatory Visit: Payer: Medicare Other

## 2018-03-04 ENCOUNTER — Ambulatory Visit: Payer: Medicare Other | Admitting: Occupational Therapy

## 2018-03-04 ENCOUNTER — Ambulatory Visit: Payer: Medicare Other | Attending: Physical Medicine & Rehabilitation | Admitting: Physical Therapy

## 2018-03-04 DIAGNOSIS — R41840 Attention and concentration deficit: Secondary | ICD-10-CM | POA: Insufficient documentation

## 2018-03-04 DIAGNOSIS — R4701 Aphasia: Secondary | ICD-10-CM | POA: Insufficient documentation

## 2018-03-04 DIAGNOSIS — M6281 Muscle weakness (generalized): Secondary | ICD-10-CM | POA: Diagnosis not present

## 2018-03-04 DIAGNOSIS — R278 Other lack of coordination: Secondary | ICD-10-CM | POA: Diagnosis not present

## 2018-03-04 DIAGNOSIS — R41844 Frontal lobe and executive function deficit: Secondary | ICD-10-CM | POA: Diagnosis not present

## 2018-03-04 DIAGNOSIS — R2681 Unsteadiness on feet: Secondary | ICD-10-CM | POA: Insufficient documentation

## 2018-03-04 DIAGNOSIS — R41841 Cognitive communication deficit: Secondary | ICD-10-CM | POA: Insufficient documentation

## 2018-03-04 DIAGNOSIS — R262 Difficulty in walking, not elsewhere classified: Secondary | ICD-10-CM | POA: Insufficient documentation

## 2018-03-04 NOTE — Therapy (Signed)
[facility] [address] [place], [place], [address] Phone: [phone]   Fax:  [phone]  Physical Therapy Treatment  Patient Details  Name: Tara [name] MRN: [id number] Date of Birth: 10-24-1951 Referring Provider: Alysia Penna   Encounter Date: 03/04/2018  PT End of Session - 03/04/18 0940    Visit Number  9    Number of Visits  13    Date for PT Re-Evaluation  03/20/18    Authorization Type  MCR    Authorization Time Period  01/19/18 to 03/20/18    PT Start Time  0935    PT Stop Time  1015    PT Time Calculation (min)  40 min    Equipment Utilized During Treatment  Gait belt    Activity Tolerance  Patient tolerated treatment well    Behavior During Therapy  [facility] for tasks assessed/performed       History reviewed. No pertinent past medical history.  Past Surgical History:  Procedure Laterality Date  . APPLICATION OF CRANIAL NAVIGATION N/A 01/04/2018   Procedure: APPLICATION OF CRANIAL NAVIGATION;  Surgeon: Erline Levine, MD;  Location: [facility];  Service: Neurosurgery;  Laterality: N/A;  . PR DURAL GRAFT REPAIR,SPINE DEFECT Left 01/04/2018   Procedure: Left Pterional craniotomy for biopsy with Brainlab;  Surgeon: Erline Levine, MD;  Location: [facility];  Service: Neurosurgery;  Laterality: Left;  Left Pterional craniotomy for biopsy with Brainlab    There were no vitals filed for this visit.  Subjective Assessment - 03/04/18 0938    Subjective  Had the MRI and waiting on results. Has also heard from [facility], they have reviewed all her records except for the MRI and will call her for an appt after they review that. No falls or pain to report. Frustrated about the meds and not knowing what is going on.                           Patient is accompained by:  Family member    Pertinent History  seixure d/o, HTN    Patient Stated Goals  Be able to help my daughter with her baby ( due in one month, [place], [place])     Currently in Pain?  No/denies    Pain Score  0-No pain           [facility] Adult PT Treatment/Exercise - 03/04/18 0941      Transfers   Transfers  Sit to Stand;Stand to Sit    Sit to Stand  7: Independent    Stand to Sit  7: Independent      Ambulation/Gait   Ambulation/Gait  Yes    Ambulation/Gait Assistance  6: Modified independent (Device/Increase time)    Ambulation/Gait Assistance Details  had pt engage in conversation and scanning enviroment with no balance issues noted.               Ambulation Distance (Feet)  1000 Feet    Assistive device  None    Gait Pattern  Step-through pattern;Decreased stride length    Ambulation Surface  Level;Unlevel;Indoor;Outdoor;Paved      Neuro Re-ed    Neuro Re-ed Details   for balance/coordination/muscle re-ed: obstacle course red mat with bean bags scattered underneath<>stepping stones<>red mat with hurdles of varied height for reciprocal stepping over- performed 6 laps across with light touch to counter/min assist progressing to no UE support and min guard/min assist.  Balance Exercises - 03/04/18 0950      Balance Exercises: Standing   Standing Eyes Closed  Wide (BOA);Head turns;Foam/compliant surface;Other reps (comment);30 secs;Limitations    SLS  Eyes open;Foam/compliant surface;3 reps;Limitations    Other Standing Exercises  standing on floor with 3 cones in a row in front of her:       Balance Exercises: Standing   Standing Eyes Closed Limitations  on folded dense blue foam mat in corner with chair in front for safety: EC no head movements, progressing to EC head movements left<>right and up<>down. min guard assist to min assist with posterior/right lateral sway with balance losses.                      SLS Limitations  standing on 1 inch foam with colored circles in star shape with 8 points- toe touch to each in circle pattern x 2 laps each LE. no UE support with min to mod assist, cues on  weight shifting and technique.           PT Short Term Goals - 01/31/18 1939      PT SHORT TERM GOAL #1   Title  Complete 6 MWT. Update LTG if appropriate. (Target 02/04/18)    Baseline  01/31/18 met    Status  Achieved        PT Long Term Goals - 01/31/18 1940      PT LONG TERM GOAL #1   Title  Patient will be independent with HEP and able to verbalize a plan for continued community-based activity upon d/c from PT (Target for all LTGs 03/11/18--due to delayed start by 1.5 wks after evaluation based on census)      PT LONG TERM GOAL #2   Title  Patient will score >=22/30 on FGA to demonstrate improved balance and lesser fall risk.     Baseline  17/30    Time  6    Period  Weeks    Status  New      PT LONG TERM GOAL #3   Title  Patient will improve 6 MWT to 1250 ft, progressing to an appropriate distance for her age to demonstrate improved gait, balance, and endurance.     Baseline  01/31/18 1050 ft (age based norm 1765 ft)    Time  6    Period  Weeks    Status  On-going      PT LONG TERM GOAL #4   Title  Patient will ambulate modified independently 200 ft over unlevel outdoor terrain while engaged in cognitive challenge/dual task activity.      Time  6    Period  Weeks    Status  New            Plan - 03/04/18 0940    Clinical Impression Statement  Today's skilled session continued to focus on multi-tasking with gait on various surfaces (had pt engaged in conversation) with no issues noted. Remainder of session continued to address high level balance activities with no issues reported by pt in session. Pt did improve with balance strategies as reps progressed over obstacle course. Pt is progressing toward goals and should meet LTGs by target date of 03/11/18.         Rehab Potential  Good    Clinical Impairments Affecting Rehab Potential  ? degree of cognitive deficits (she downplays and ?expressive difficulties make it difficult to discern cognition vs language deficit)     PT Frequency  2x / week    PT Duration  6 weeks    PT Treatment/Interventions  ADLs/Self Care Home Management;Gait training;Cognitive remediation;Neuromuscular re-education;Balance training;Therapeutic exercise;Therapeutic activities;Functional mobility training;Stair training;Patient/family education;Passive range of motion    PT Next Visit Plan  begin to look at LTGs due on 03/11/18    Consulted and Agree with Plan of Care  Patient       Patient will benefit from skilled therapeutic intervention in order to improve the following deficits and impairments:  Decreased balance, Decreased cognition, Decreased strength, Decreased safety awareness, Difficulty walking, Postural dysfunction  Visit Diagnosis: Muscle weakness (generalized)  Unsteadiness on feet  Difficulty in walking, not elsewhere classified     Problem List Patient Active Problem List   Diagnosis Date Noted  . 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 Stroke ([place]) 01/21/2018  . Reactive hypertension   . Hypoalbuminemia due to protein-calorie malnutrition ([place])   . Seizures ([place])   . Elevated BUN   . Brain mass 01/06/2018  . Aphasia   . Abnormal CT of the head   . CNS mass 01/01/2018  . Acute metabolic encephalopathy 66/44/0347  . Headache 12/31/2017  . Hypertension 12/31/2017  . Hyperglycemia 12/31/2017  . Confusion 12/31/2017    Willow Ora, PTA, [facility] [address] [place], [place] [address] [phone] 03/04/18, 5:12 PM   Name: Tara [name] MRN: [id number] Date of Birth: [birth year]

## 2018-03-04 NOTE — Therapy (Addendum)
[facility] [address] [place], [place], [address] Phone: [phone]   Fax:  [phone]  Speech Language Pathology Treatment  Patient Details  Name: Tara [name] MRN: [id number] Date of Birth: 1950-12-18 Referring Provider: Dr. Alysia Penna   Encounter Date: 03/04/2018  End of Session - 03/04/18 1423    Visit Number  6    Number of Visits  17    Date for SLP Re-Evaluation  03/18/18    SLP Start Time  4401    SLP Stop Time   1145    SLP Time Calculation (min)  43 min    Activity Tolerance  Patient tolerated treatment well       History reviewed. No pertinent past medical history.  Past Surgical History:  Procedure Laterality Date  . APPLICATION OF CRANIAL NAVIGATION N/A 01/04/2018   Procedure: APPLICATION OF CRANIAL NAVIGATION;  Surgeon: Erline Levine, MD;  Location: [facility];  Service: Neurosurgery;  Laterality: N/A;  . PR DURAL GRAFT REPAIR,SPINE DEFECT Left 01/04/2018   Procedure: Left Pterional craniotomy for biopsy with Brainlab;  Surgeon: Erline Levine, MD;  Location: [facility];  Service: Neurosurgery;  Laterality: Left;  Left Pterional craniotomy for biopsy with Brainlab    There were no vitals filed for this visit.  Subjective Assessment - 03/04/18 1117    Subjective  I was talking to Curt Bears (one of the [place]) about joining a gym and she said that was a good idea - I should take pictures of the equipment though."    Currently in Pain?  No/denies            ADULT SLP TREATMENT - 03/04/18 1111      General Information   Behavior/Cognition  Alert;Cooperative;Pleasant mood;Impulsive      Treatment Provided   Treatment provided  Cognitive-Linquistic      Cognitive-Linquistic Treatment   Treatment focused on  Cognition    Skilled Treatment  Pt's description of game instructions req'd SLP questioning cues occasionally. In a detailed organization task pt missed one errand without SLP cue and  was without awareness, and she req'd min A from SLP for other details (writing down an errand she had already completed), without awareness. Pt coud not think of name of restaurant and req'd cues from SLP to use compensation of descripton.       Assessment / Recommendations / Plan   Plan  Continue with current plan of care;Goals updated      Progression Toward Goals   Progression toward goals  Progressing toward goals       SLP Education - 03/04/18 1422    Education provided  Yes    Education Details  anomia compensation (description), decr'd attention to detail with detailed task    Person(s) Educated  Patient    Methods  Explanation    Comprehension  Verbalized understanding       SLP Short Term Goals - 03/04/18 1425      SLP SHORT TERM GOAL #1   Title  Pt will complete moderately complex naming tasks with occasional min A over 3 sessions.    Status  Deferred      SLP SHORT TERM GOAL #2   Title  Pt will utilize compensations for aphasia in structured naming tasks 4/5 opportunites with occasional min A over 2 sessions    Status  Deferred      SLP SHORT TERM GOAL #3   Title  Pt will utiltize compensations for aphasia over 10  minute modconversation 3/5 opportunites with occasional min A oer 2 sessions    Status  Deferred      SLP SHORT TERM GOAL #4   Title  Written expression/reading comprehension assessment if indicated    Status  Achieved      SLP SHORT TERM GOAL #5   Title  pt will tell SLP/indicate 4 memory strategies she could use to improve memory over two sessions    Time  2    Period  Weeks    Status  On-going      Additional Short Term Goals   Additional Short Term Goals  Yes       SLP Long Term Goals - 03/04/18 1427      SLP LONG TERM GOAL #1   Title  Pt will complete complex naming tasks with rare min A over 3 sessions    Status  Deferred      SLP LONG TERM GOAL #2   Title  Pt will utilize compensations for aphasia over 15 minute complex conversation  with rare min A over 2 sessions.    Time  10    Period  Weeks    Status  On-going      SLP LONG TERM GOAL #3   Title  pt will utilize at least one memory strategy in 5 therapy sessions    Time  6    Status  On-going      SLP LONG TERM GOAL #4   Title  pt will note her errors in detailed linguistic tasks, when cued to double check her responses, 100% of the time    Time  6    Period  Weeks    Status  New       Plan - 03/04/18 1423    Clinical Impression Statement  Pt demo'd dificulty with name of a restaurant, req'd cues from SLP to use strategy for assistance. She demonstrated reduced attention to detail as well as reduced error awareness in detailed written tasks. Goals to be updated.I recommend skilled ST to maximize compensations for mild verbal expression deficits and for improved cognitive linguistic skills to promote incr'd independence and QOL.     Speech Therapy Frequency  2x / week    Treatment/Interventions  Cognitive reorganization;Functional tasks;Compensatory strategies;SLP instruction and feedback;Patient/family education;Internal/external aids;Environmental controls;Cueing hierarchy    Potential to Achieve Goals  Good    Consulted and Agree with Plan of Care  Patient       Patient will benefit from skilled therapeutic intervention in order to improve the following deficits and impairments:   Cognitive communication deficit  Aphasia    Problem List Patient Active Problem List   Diagnosis Date Noted  . 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 Stroke ([facility]) 01/21/2018  . Reactive hypertension   . Hypoalbuminemia due to protein-calorie malnutrition ([facility])   . Seizures ([facility])   . Elevated BUN   . Brain mass 01/06/2018  . Aphasia   . Abnormal CT of the head   . CNS mass 01/01/2018  . Acute metabolic encephalopathy 51/88/4166  . Headache 12/31/2017  . Hypertension  12/31/2017  . Hyperglycemia 12/31/2017  . Confusion 12/31/2017    [facility] ,[place], [place]  03/04/2018, 2:50 PM  [facility] [address] [place], [place], [address] Phone: [phone]   Fax:  [phone]   Name: Tara [name] MRN: [id number] Date of Birth:  09/02/1951 

## 2018-03-04 NOTE — Therapy (Signed)
[facility] [address] [place], [place], [address] Phone: [phone]   Fax:  [phone]  Occupational Therapy Treatment  Patient Details  Name: Tara [name] MRN: [id number] Date of Birth: May 09, 1951 Referring Provider: Alysia Penna   Encounter Date: 03/04/2018  OT End of Session - 03/04/18 1052    Visit Number  9    Number of Visits  25    Authorization Time Period  goals written for 12 weeks, may d/c after 8 weeks depending on progress    OT Start Time  1019    OT Stop Time  1100    OT Time Calculation (min)  41 min    Activity Tolerance  Patient tolerated treatment well    Behavior During Therapy  [facility] for tasks assessed/performed       No past medical history on file.  Past Surgical History:  Procedure Laterality Date  . APPLICATION OF CRANIAL NAVIGATION N/A 01/04/2018   Procedure: APPLICATION OF CRANIAL NAVIGATION;  Surgeon: Erline Levine, MD;  Location: [facility];  Service: Neurosurgery;  Laterality: N/A;  . PR DURAL GRAFT REPAIR,SPINE DEFECT Left 01/04/2018   Procedure: Left Pterional craniotomy for biopsy with Brainlab;  Surgeon: Erline Levine, MD;  Location: [facility];  Service: Neurosurgery;  Laterality: Left;  Left Pterional craniotomy for biopsy with Brainlab    There were no vitals filed for this visit.  Subjective Assessment - 03/04/18 1110    Pertinent History   seizure 01/02/2018 ,craniotomy as well as biopsy for brain mass 01/04/2018 per Dr. Vertell Limber, CVA- pt is still awating results of biopsy    Patient Stated Goals  to get back my independence    Currently in Pain?  No/denies          Treatment: O'connor pegs with RUE using tweezers for increased fine motor coordination and sustained pinch, min difficulty Environmental scanning with a cognitive component to locate playing cards in sequential order.  Pt got out of sequential order and pt missed 3 items on first pass, pt demonstrates continued  difficulty with task requiring increased time and min v.c from therapist. (Pt demonstrates 77% accuracy or less with task.) Pt demonstrates continued difficulties with alternating attention. Activities for memory and alternating attention on I- pad min v.c intially. Pt was encouraged to consider constant therapy for home.                    OT Short Term Goals - 03/01/18 1028      OT SHORT TERM GOAL #1   Title  I with HEP    Status  Achieved      OT SHORT TERM GOAL #2   Title  Pt will perform basic home management/ cooking with distant supervision demonstrating good safety awareness.    Status  Achieved      OT SHORT TERM GOAL #3   Title  Pt will demonstrate improved fine motor coordination for ADLs as evidenced by decreasing 9 hole peg test score by 3 secs for RUE.    Status  Achieved 23.25 sec      OT SHORT TERM GOAL #4   Title  Pt will perform a basic functional organization task with no more than 2 v.c    Status  Achieved      OT SHORT TERM GOAL #5   Title  Pt will demonstrate ability to alternate attention between 2 functional tasks with 80% or better accuracy.    Status  Achieved  OT Long Term Goals - 03/01/18 1043      OT LONG TERM GOAL #1   Title  I with updated HEP.    Time  12    Period  Weeks    Status  On-going      OT LONG TERM GOAL #2   Title  Pt will perform mod complex home management and cooking at a modified independent level demonstrating good safety awareness.    Time  12    Period  Weeks    Status  Achieved      OT LONG TERM GOAL #3   Title   Pt will perform a moderate complex organization task independently    Time  12    Period  Weeks    Status  Achieved      OT LONG TERM GOAL #4   Title  Pt will demonstrate ability to divide attention between a physical and cognitive task with 90% or better accuracy.    Time  12    Status  On-going            Plan - 03/04/18 1057    Clinical Impression Statement  Pt  progressing toward goals, however she demonstrates continued difficulties with mod complex environmental scanning with a cognitive component and alternating attention.    Occupational Profile and client history currently impacting functional performance  Tara J Matthewsis a 67 y.o.right handed femalewith history of tobacco abuse on no prescription medications. Pt. has an [employer]. Pt is retired and lives with spouse and was independent prior to admission. Two-level home with bedroom on main level. Spouse works during the day but can stay home as needed    Occupational performance deficits (Please refer to evaluation for details):  IADL's;ADL's;Leisure;Social Participation    Rehab Potential  Good    OT Frequency  2x / week    OT Duration  12 weeks    OT Treatment/Interventions  Self-care/ADL training;Therapeutic exercise;Patient/family education;Neuromuscular education;Moist Heat;Paraffin;Fluidtherapy;Therapist, nutritional;Therapeutic activities;Cognitive remediation/compensation;Passive range of motion;Manual Therapy;DME and/or AE instruction;Ultrasound;Cryotherapy    Plan   alternating attention, environmental scanning with a cognitive component,     Consulted and Agree with Plan of Care  Patient       Patient will benefit from skilled therapeutic intervention in order to improve the following deficits and impairments:  Abnormal gait, Decreased cognition, Impaired vision/preception, Decreased mobility, Decreased coordination, Decreased activity tolerance, Decreased endurance, Decreased range of motion, Decreased strength, Impaired UE functional use, Difficulty walking, Impaired perceived functional ability, Decreased safety awareness, Decreased knowledge of precautions, Decreased balance  Visit Diagnosis: Muscle weakness (generalized)  Attention and concentration deficit  Other lack of coordination  Frontal lobe and executive function deficit    Problem List Patient Active Problem  List   Diagnosis Date Noted  . 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 Stroke ([facility]) 01/21/2018  . Reactive hypertension   . Hypoalbuminemia due to protein-calorie malnutrition ([facility])   . Seizures ([place])   . Elevated BUN   . Brain mass 01/06/2018  . Aphasia   . Abnormal CT of the head   . CNS mass 01/01/2018  . Acute metabolic encephalopathy 27/74/1287  . Headache 12/31/2017  . Hypertension 12/31/2017  . Hyperglycemia 12/31/2017  . Confusion 12/31/2017    Rihan Schueler 03/04/2018, 11:11 AM  [facility] [address] [place], [place], [address] Phone: [phone]   Fax:  [phone]  Name: MALIKA DEMARIO MRN: [id number] Date of Birth: 1950-12-22

## 2018-03-04 NOTE — Patient Instructions (Signed)
  Please complete the assigned speech therapy homework prior to your next session and return it to the speech therapist at your next visit.  

## 2018-03-07 ENCOUNTER — Ambulatory Visit: Payer: Medicare Other | Admitting: Speech Pathology

## 2018-03-07 ENCOUNTER — Encounter: Payer: Self-pay | Admitting: Physical Therapy

## 2018-03-07 ENCOUNTER — Ambulatory Visit: Payer: Medicare Other | Admitting: Occupational Therapy

## 2018-03-07 ENCOUNTER — Ambulatory Visit: Payer: Medicare Other | Admitting: Physical Therapy

## 2018-03-07 DIAGNOSIS — R2681 Unsteadiness on feet: Secondary | ICD-10-CM | POA: Diagnosis not present

## 2018-03-07 DIAGNOSIS — M6281 Muscle weakness (generalized): Secondary | ICD-10-CM

## 2018-03-07 DIAGNOSIS — R41844 Frontal lobe and executive function deficit: Secondary | ICD-10-CM

## 2018-03-07 DIAGNOSIS — R262 Difficulty in walking, not elsewhere classified: Secondary | ICD-10-CM

## 2018-03-07 DIAGNOSIS — R278 Other lack of coordination: Secondary | ICD-10-CM

## 2018-03-07 DIAGNOSIS — R41840 Attention and concentration deficit: Secondary | ICD-10-CM

## 2018-03-07 DIAGNOSIS — R41841 Cognitive communication deficit: Secondary | ICD-10-CM

## 2018-03-07 DIAGNOSIS — R4701 Aphasia: Secondary | ICD-10-CM | POA: Diagnosis not present

## 2018-03-07 NOTE — Patient Instructions (Addendum)
  Memory Strategies  W - Write it down  A - Associate it with something  R - Repeat it  M - Mental Image     Play the memory game  Try to remember 3-5 items on your store list without looking  Study a detailed picture in a magazine for 1 minute, then write down everything you can remember from the picture   Strategies for Improving Your Attention and Memory  Use good eye-contact  Give the speaker your undivided attention  Look directly at the speaker  Complete one task at a time  Avoid multitasking  Complete one task before starting a new one  Write a note to yourself if you think of something else that needs to be done  Let others know when you need quiet time and can't be interrupted  Don't answer the phone, texts, or emails while you are working on another task  Put aside distracting thoughts  If you find your mind wandering, refocus your attention on the speaker  Avoid off-topic comments or responses that may divert your attention   If something important comes to mind, let the speaker know and pause to write yourself a note: "Do you mind holding on a minute, I have to write something down."  Put thoughts on hold and focus on salient information  Limit distractions in your environment  Think about the environment around you  Limit background noise by turning off the TV or music, putting your phone away  Close the door and work in quiet  Use active listening  Actively participate in the conversation to stay focused  Paraphrase what you have heard to include the most important details  Adding some associations may help you remember  Ask questions to clarify certain points  Summarize the speaker's comments periodically  Avoid nodding your head and using "mhm" responses as these are more passive and don't help your attention  Alert the other person/people  It may be helpful to alert your listener to the fact that you may need reminders to keep  on track  Tell the speaker in advance that you may need to stop them and have them repeat salient information  If you lose focus, interject and let the person know, "I'm sorry, I lost you, can you tell me again?"  Write down information  Write down pertinent information as it comes up, such as telephone numbers, names of people, addresses, details from appointments and conversations, etc.

## 2018-03-07 NOTE — Therapy (Signed)
[facility] [address] [place], [place], [address] Phone: [phone]   Fax:  [phone]  Occupational Therapy Treatment  Patient Details  Name: Tara [name] MRN: [id number] Date of Birth: 1951-07-17 Referring Provider: Alysia Penna   Encounter Date: 03/07/2018  OT End of Session - 03/07/18 2146    Visit Number  10    Number of Visits  25    Date for OT Re-Evaluation  04/19/18    Authorization Time Period  goals written for 12 weeks, may d/c after 8 weeks depending on progress    OT Start Time  1020    OT Stop Time  1100    OT Time Calculation (min)  40 min    Activity Tolerance  Patient tolerated treatment well    Behavior During Therapy  [facility] for tasks assessed/performed       No past medical history on file.  Past Surgical History:  Procedure Laterality Date  . APPLICATION OF CRANIAL NAVIGATION N/A 01/04/2018   Procedure: APPLICATION OF CRANIAL NAVIGATION;  Surgeon: Erline Levine, MD;  Location: [facility];  Service: Neurosurgery;  Laterality: N/A;  . PR DURAL GRAFT REPAIR,SPINE DEFECT Left 01/04/2018   Procedure: Left Pterional craniotomy for biopsy with Brainlab;  Surgeon: Erline Levine, MD;  Location: [facility];  Service: Neurosurgery;  Laterality: Left;  Left Pterional craniotomy for biopsy with Brainlab    There were no vitals filed for this visit.  Subjective Assessment - 03/07/18 1053    Subjective   Pt reports that her MRI  came back as normal, MD is not sure if she has a CNS lymphoma    Pertinent History   seizure 01/02/2018 ,craniotomy as well as biopsy for brain mass 01/04/2018 per Dr. Vertell Limber, CVA- pt is still awating results of biopsy    Currently in Pain?  No/denies            Treatment: Divided attention task to ambulate while tossing ball and performing environmental scanning, 12/14 items located first pass, pt continues to miss items on right side. Dynamic standing balance activity to  copy small peg design with RUE while standing on non compliant surface, min difficulty, no LOB, supervision provided. Activities on constant therapy to challenge memory, attention and visual perceptual skills, min v.c initially.                 OT Short Term Goals - 03/01/18 1028      OT SHORT TERM GOAL #1   Title  I with HEP    Status  Achieved      OT SHORT TERM GOAL #2   Title  Pt will perform basic home management/ cooking with distant supervision demonstrating good safety awareness.    Status  Achieved      OT SHORT TERM GOAL #3   Title  Pt will demonstrate improved fine motor coordination for ADLs as evidenced by decreasing 9 hole peg test score by 3 secs for RUE.    Status  Achieved 23.25 sec      OT SHORT TERM GOAL #4   Title  Pt will perform a basic functional organization task with no more than 2 v.c    Status  Achieved      OT SHORT TERM GOAL #5   Title  Pt will demonstrate ability to alternate attention between 2 functional tasks with 80% or better accuracy.    Status  Achieved        OT Long Term  Goals - 03/07/18 1025      OT LONG TERM GOAL #1   Title  I with updated HEP.  (Pended)     Status  On-going  (Pended)       OT LONG TERM GOAL #2   Title  Pt will perform mod complex home management and cooking at a modified independent level demonstrating good safety awareness.  (Pended)     Status  On-going  (Pended)       OT LONG TERM GOAL #3   Title   Pt will perform a moderate complex organization task independently  (Pended)     Status  Achieved  (Pended)       OT LONG TERM GOAL #4   Title  Pt will demonstrate ability to divide attention between a physical and cognitive task with 90% or better accuracy.  (Pended)     Status  On-going  (Pended)               Patient will benefit from skilled therapeutic intervention in order to improve the following deficits and impairments:     Visit Diagnosis: Unsteadiness on feet  Muscle weakness  (generalized)  Attention and concentration deficit  Other lack of coordination  Frontal lobe and executive function deficit    Problem List Patient Active Problem List   Diagnosis Date Noted  . 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 Stroke ([facility]) 01/21/2018  . Reactive hypertension   . Hypoalbuminemia due to protein-calorie malnutrition ([place] [facility])   . Seizures ([facility])   . Elevated BUN   . Brain mass 01/06/2018  . Aphasia   . Abnormal CT of the head   . CNS mass 01/01/2018  . Acute metabolic encephalopathy 43/32/9518  . Headache 12/31/2017  . Hypertension 12/31/2017  . Hyperglycemia 12/31/2017  . Confusion 12/31/2017    Tara [name] 03/07/2018, 9:47 PM  [facility] [address] [place], [place], [address] Phone: [phone]   Fax:  [phone]  Name: Tara [name] MRN: [id number] Date of Birth: 27-Nov-1950

## 2018-03-07 NOTE — Therapy (Signed)
[facility] [address] [place], [place], [address] Phone: [phone]   Fax:  [phone]  Speech Language Pathology Treatment  Patient Details  Name: Tara [name] MRN: [id number] Date of Birth: Feb 23, 1951 Referring Provider: Dr. Alysia Penna   Encounter Date: 03/07/2018  End of Session - 03/07/18 1037    Visit Number  7    Number of Visits  17    Date for SLP Re-Evaluation  03/18/18    SLP Start Time  0930    SLP Stop Time   1017    SLP Time Calculation (min)  47 min    Activity Tolerance  Patient tolerated treatment well       No past medical history on file.  Past Surgical History:  Procedure Laterality Date  . APPLICATION OF CRANIAL NAVIGATION N/A 01/04/2018   Procedure: APPLICATION OF CRANIAL NAVIGATION;  Surgeon: Erline Levine, MD;  Location: [facility];  Service: Neurosurgery;  Laterality: N/A;  . PR DURAL GRAFT REPAIR,SPINE DEFECT Left 01/04/2018   Procedure: Left Pterional craniotomy for biopsy with Brainlab;  Surgeon: Erline Levine, MD;  Location: [facility];  Service: Neurosurgery;  Laterality: Left;  Left Pterional craniotomy for biopsy with Brainlab    There were no vitals filed for this visit.  Subjective Assessment - 03/07/18 0931    Subjective  "I don't know if it's right or not. I did it real fast."    Currently in Pain?  No/denies            ADULT SLP TREATMENT - 03/07/18 0930      General Information   Behavior/Cognition  Alert;Cooperative;Pleasant mood;Impulsive    Patient Positioning  Upright in chair      Treatment Provided   Treatment provided  Cognitive-Linquistic      Pain Assessment   Pain Assessment  No/denies pain      Cognitive-Linquistic Treatment   Treatment focused on  Cognition    Skilled Treatment  SLP facilitated 15 minutes mod complex conversation; pt could not recall name of area she is traveling to in [place] or name of the cruise she is taking; min-mod question  cues for further description, details to convey her message. In attention to detail cognitive task, pt ID'd 2/2 errors with cues for double checking. Pt recalled 1/4 memory strategies. SLP provided handout with WARM strategies and worked with pt on association and mental imaging in tasks including functional recall, word list retention (85% accuracy) and mental manipulation tasks. Pt recalled 5/5 items from a grocery list with 15 minute delay. Pt reported getting distracted easily, especially in larger tasks. She also admitted to relying on family members for recall of details from medical appointments. SLP educated re: breaking down larger tasks into smaller steps, scheduling tasks in her planner, and encouraged pt to take a more active role in writing down important information vs relying on family members. Pt to come up with a list of smaller steps for clearing out her attic for homework. SLP also encouraged pt to listen to her son's recording of her upcoming appointment [facility] and record key details.      Assessment / Recommendations / Plan   Plan  Continue with current plan of care;Goals updated      Progression Toward Goals   Progression toward goals  Progressing toward goals       SLP Education - 03/07/18 1036    Education provided  Yes    Education Details  break down larger  tasks; write down importantn information vs relying on family to remember    Person(s) Educated  Patient    Methods  Explanation;Handout    Comprehension  Verbalized understanding       SLP Short Term Goals - 03/07/18 0945      SLP SHORT TERM GOAL #1   Title  Pt will complete moderately complex naming tasks with occasional min A over 3 sessions.    Status  Deferred      SLP SHORT TERM GOAL #2   Title  Pt will utilize compensations for aphasia in structured naming tasks 4/5 opportunites with occasional min A over 2 sessions    Status  Deferred      SLP SHORT TERM GOAL #3   Title  Pt will utiltize  compensations for aphasia over 10 minute modconversation 3/5 opportunites with occasional min A oer 2 sessions    Status  Deferred      SLP SHORT TERM GOAL #4   Title  Written expression/reading comprehension assessment if indicated    Status  Achieved      SLP SHORT TERM GOAL #5   Title  pt will tell SLP/indicate 4 memory strategies she could use to improve memory over two sessions    Time  1    Period  Weeks       SLP Long Term Goals - 03/07/18 1025      SLP LONG TERM GOAL #1   Title  Pt will complete complex naming tasks with rare min A over 3 sessions    Status  Deferred      SLP LONG TERM GOAL #2   Title  Pt will utilize compensations for aphasia over 15 minute complex conversation with rare min A over 2 sessions.    Time  9    Period  Weeks    Status  On-going      SLP LONG TERM GOAL #3   Title  pt will utilize at least one memory strategy in 5 therapy sessions    Time  5    Period  Weeks    Status  On-going      SLP LONG TERM GOAL #4   Title  pt will note her errors in detailed linguistic tasks, when cued to double check her responses, 100% of the time    Time  5    Period  Weeks    Status  On-going       Plan - 03/07/18 1037    Clinical Impression Statement  Pt demo'd dificulty with name of a restaurant, req'd cues from SLP to use strategy for assistance. She demonstrated reduced attention to detail as well as reduced error awareness in detailed written tasks. I recommend skilled ST to maximize compensations for mild verbal expression deficits and for improved cognitive linguistic skills to promote incr'd independence and QOL.     Speech Therapy Frequency  2x / week    Treatment/Interventions  Cognitive reorganization;Functional tasks;Compensatory strategies;SLP instruction and feedback;Patient/family education;Internal/external aids;Environmental controls;Cueing hierarchy    Potential to Achieve Goals  Good    Consulted and Agree with Plan of Care  Patient        Patient will benefit from skilled therapeutic intervention in order to improve the following deficits and impairments:   Cognitive communication deficit    Problem List Patient Active Problem List   Diagnosis Date Noted  . 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 Gait disturbance, post-stroke  01/27/2018  . Stroke ([facility]) 01/21/2018  . Reactive hypertension   . Hypoalbuminemia due to protein-calorie malnutrition ([facility])   . Seizures ([facility])   . Elevated BUN   . Brain mass 01/06/2018  . Aphasia   . Abnormal CT of the head   . CNS mass 01/01/2018  . Acute metabolic encephalopathy 09/17/5207  . Headache 12/31/2017  . Hypertension 12/31/2017  . Hyperglycemia 12/31/2017  . Confusion 12/31/2017   Deneise Lever, [place], [place] 03/07/2018, 10:38 AM  [facility] [address] [place], [place], [address] Phone: [phone]   Fax:  [phone]   Name: RANDEE HUSTON MRN: [id number] Date of Birth: [birth year]

## 2018-03-07 NOTE — Therapy (Signed)
[facility] [address] [place], [place], [address] Phone: [phone]   Fax:  [phone]  Physical Therapy Treatment  Patient Details  Name: Tara [name] MRN: [id number] Date of Birth: 1951-09-06 Referring Provider: Alysia Penna   Encounter Date: 03/07/2018  PT End of Session - 03/07/18 1142    Visit Number  10    Number of Visits  13    Date for PT Re-Evaluation  03/20/18    Authorization Type  MCR    Authorization Time Period  01/19/18 to 03/20/18    PT Start Time  1102    PT Stop Time  1137 session ended early due to elevated BP    PT Time Calculation (min)  35 min    Activity Tolerance  Patient tolerated treatment well    Behavior During Therapy  [facility] for tasks assessed/performed       History reviewed. No pertinent past medical history.  Past Surgical History:  Procedure Laterality Date  . APPLICATION OF CRANIAL NAVIGATION N/A 01/04/2018   Procedure: APPLICATION OF CRANIAL NAVIGATION;  Surgeon: Erline Levine, MD;  Location: [facility];  Service: Neurosurgery;  Laterality: N/A;  . PR DURAL GRAFT REPAIR,SPINE DEFECT Left 01/04/2018   Procedure: Left Pterional craniotomy for biopsy with Brainlab;  Surgeon: Erline Levine, MD;  Location: [facility];  Service: Neurosurgery;  Laterality: Left;  Left Pterional craniotomy for biopsy with Brainlab    There were no vitals filed for this visit.  Subjective Assessment - 03/07/18 1104    Subjective  Results from recent MRI showed "a normal looking brain" per [facility] MD. Sees Dr Mickeal Skinner this week.     Patient is accompained by:  Family member    Pertinent History  seixure d/o, HTN    Patient Stated Goals  Be able to help my daughter with her baby ( due in one month, [place], [place])    Currently in Pain?  No/denies         [facility] PT Assessment - 03/07/18 1123      6 Minute Walk- Baseline   BP (mmHg)  155/100  (Abnormal)  LUE; 162/114 RUE    HR (bpm)  73    02 Sat (%RA)   95 %    Modified Borg Scale for Dyspnea  0- Nothing at all      6 minute walk test results    Endurance additional comments  testing deferred due to elevated BP      Functional Gait  Assessment   Gait Level Surface  Walks 20 ft in less than 7 sec but greater than 5.5 sec, uses assistive device, slower speed, mild gait deviations, or deviates 6-10 in outside of the 12 in walkway width. 5.88    Change in Gait Speed  Able to smoothly change walking speed without loss of balance or gait deviation. Deviate no more than 6 in outside of the 12 in walkway width.    Gait with Horizontal Head Turns  Performs head turns smoothly with no change in gait. Deviates no more than 6 in outside 12 in walkway width    Gait with Vertical Head Turns  Performs task with slight change in gait velocity (eg, minor disruption to smooth gait path), deviates 6 - 10 in outside 12 in walkway width or uses assistive device    Gait and Pivot Turn  Pivot turns safely within 3 sec and stops quickly with no loss of balance.    Step Over Obstacle  Is able to step over 2 stacked shoe boxes taped together (9 in total height) without changing gait speed. No evidence of imbalance.    Gait with Narrow Base of Support  Ambulates 7-9 steps. 8 stesp    Gait with Eyes Closed  Walks 20 ft, slow speed, abnormal gait pattern, evidence for imbalance, deviates 10-15 in outside 12 in walkway width. Requires more than 9 sec to ambulate 20 ft.    Ambulating Backwards  Walks 20 ft, uses assistive device, slower speed, mild gait deviations, deviates 6-10 in outside 12 in walkway width.    Steps  Alternating feet, must use rail.    Total Score  23                   [facility] Adult PT Treatment/Exercise - 03/07/18 1143      Ambulation/Gait   Ambulation/Gait Assistance  6: Modified independent (Device/Increase time) decr velocity    Ambulation/Gait Assistance Details  outdoors over paved surfaces, including curbs, elevation and slope  downwards    Ambulation Distance (Feet)  1000 Feet    Assistive device  None    Gait Pattern  Step-through pattern;Decreased stride length    Ambulation Surface  Unlevel;Outdoor;Paved    Curb  5: Supervision;4: Min assist    Curb Details (indicate cue type and reason)  stagger step to her right upon stepping down; independent recovery    Gait Comments  during cognitive task (subtracting by 3's while walking) her velocity slowed and BOS widened slightly and slight drift to rt x 2             PT Education - 03/07/18 1147    Education Details  results of goals that were checked; elevated BP prevented completing 6MWT for goal; need to re-check when at home and notifiy MD if DBP>100.     Person(s) Educated  Patient    Methods  Explanation    Comprehension  Verbalized understanding       PT Short Term Goals - 01/31/18 1939      PT SHORT TERM GOAL #1   Title  Complete 6 MWT. Update LTG if appropriate. ([facility] 02/04/18)    Baseline  01/31/18 met    Status  Achieved        PT Long Term Goals - 03/07/18 1109      PT LONG TERM GOAL #1   Title  Patient will be independent with HEP and able to verbalize a plan for continued community-based activity upon d/c from PT (Target for all LTGs 03/11/18--due to delayed start by 1.5 wks after evaluation based on census)    Status  On-going      PT LONG TERM GOAL #2   Title  Patient will score >=22/30 on FGA to demonstrate improved balance and lesser fall risk.     Baseline  17/30; 03/07/18 23/30    Time  6    Period  Weeks    Status  Achieved      PT LONG TERM GOAL #3   Title  Patient will improve 6 MWT to 1250 ft, progressing to an appropriate distance for her age to demonstrate improved gait, balance, and endurance.     Baseline  01/31/18 1050 ft (age based norm 1765 ft)    Time  6    Period  Weeks    Status  On-going      PT LONG TERM GOAL #4   Title  Patient will ambulate modified independently 200 ft  over unlevel outdoor terrain while  engaged in cognitive challenge/dual task activity.      Baseline  5/6 see notes; met    Time  6    Period  Weeks    Status  Achieved            Plan - 03/07/18 1153    Clinical Impression Statement  Initiated session by discussing pt's progress--she reports in everyday tasks she can tell her balance has improved. Not sure she is as good as her baseline, but admits she has never had great balance. She is not sure she feels ready to d/c from PT and agreed to check LTGs and then discuss. Session focused on beginning to check LTGs with pt meeting 2 of 4 goals assessed. Unable to assess goal related to 6 MWT due to patient's BP elevated (155/100 LUE--uninvolved side; 162/114 RUE; both with dinamap because could not hear it with stethoscope). Pt denied any symptoms and states she did take her medicine this morning. Encouraged pt to call her MD if DBP remains >100. She can take her BP at home. States she did not eat anytthing this morning and wonders if that has anything to do with her BP being elevated.     Rehab Potential  Good    Clinical Impairments Affecting Rehab Potential  ? degree of cognitive deficits (she downplays and ?expressive difficulties make it difficult to discern cognition vs language deficit)    PT Frequency  2x / week    PT Duration  6 weeks    PT Treatment/Interventions  ADLs/Self Care Home Management;Gait training;Cognitive remediation;Neuromuscular re-education;Balance training;Therapeutic exercise;Therapeutic activities;Functional mobility training;Stair training;Patient/family education;Passive range of motion    PT Next Visit Plan  finish looking at LTGs (6MWT and indep with HEP and d/c plan for continued activity); ? last visit vs recert for 2 more weeks?  (POC ends 5/9 as PT was only for 6 weeks, but other therapies were for 8 weeks and she was accidently scheduled for 8 wks of PT as well).     Consulted and Agree with Plan of Care  Patient       Patient will benefit  from skilled therapeutic intervention in order to improve the following deficits and impairments:  Decreased balance, Decreased cognition, Decreased strength, Decreased safety awareness, Difficulty walking, Postural dysfunction  Visit Diagnosis: Unsteadiness on feet  Difficulty in walking, not elsewhere classified     Problem List Patient Active Problem List   Diagnosis Date Noted  . 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 Stroke ([facility]) 01/21/2018  . Reactive hypertension   . Hypoalbuminemia due to protein-calorie malnutrition ([facility])   . Seizures ([facility])   . Elevated BUN   . Brain mass 01/06/2018  . Aphasia   . Abnormal CT of the head   . CNS mass 01/01/2018  . Acute metabolic encephalopathy 77/93/9030  . Headache 12/31/2017  . Hypertension 12/31/2017  . Hyperglycemia 12/31/2017  . Confusion 12/31/2017    Rexanne Mano, PT 03/07/2018, 12:14 PM  [facility] [address] [place], [place], [address] Phone: [phone]   Fax:  [phone]  Name: Tara [name] MRN: [id number] Date of Birth: [birth year]

## 2018-03-08 ENCOUNTER — Inpatient Hospital Stay: Payer: Medicare Other | Attending: Internal Medicine | Admitting: Internal Medicine

## 2018-03-08 ENCOUNTER — Encounter: Payer: Self-pay | Admitting: Physical Medicine & Rehabilitation

## 2018-03-08 ENCOUNTER — Encounter: Payer: Medicare Other | Attending: Physical Medicine & Rehabilitation

## 2018-03-08 ENCOUNTER — Ambulatory Visit (HOSPITAL_BASED_OUTPATIENT_CLINIC_OR_DEPARTMENT_OTHER): Payer: Medicare Other | Admitting: Physical Medicine & Rehabilitation

## 2018-03-08 ENCOUNTER — Encounter: Payer: Self-pay | Admitting: Internal Medicine

## 2018-03-08 VITALS — BP 146/99 | HR 77 | Ht 66.0 in | Wt 231.0 lb

## 2018-03-08 VITALS — BP 161/119 | HR 88 | Temp 98.8°F | Resp 20 | Ht 66.0 in | Wt 230.3 lb

## 2018-03-08 DIAGNOSIS — Z87891 Personal history of nicotine dependence: Secondary | ICD-10-CM

## 2018-03-08 DIAGNOSIS — R4701 Aphasia: Secondary | ICD-10-CM | POA: Diagnosis not present

## 2018-03-08 DIAGNOSIS — G939 Disorder of brain, unspecified: Secondary | ICD-10-CM

## 2018-03-08 DIAGNOSIS — G8191 Hemiplegia, unspecified affecting right dominant side: Secondary | ICD-10-CM | POA: Insufficient documentation

## 2018-03-08 DIAGNOSIS — I69398 Other sequelae of cerebral infarction: Secondary | ICD-10-CM | POA: Diagnosis not present

## 2018-03-08 DIAGNOSIS — I69331 Monoplegia of upper limb following cerebral infarction affecting right dominant side: Secondary | ICD-10-CM | POA: Diagnosis not present

## 2018-03-08 DIAGNOSIS — Z7982 Long term (current) use of aspirin: Secondary | ICD-10-CM

## 2018-03-08 DIAGNOSIS — R600 Localized edema: Secondary | ICD-10-CM | POA: Diagnosis not present

## 2018-03-08 DIAGNOSIS — Z800 Family history of malignant neoplasm of digestive organs: Secondary | ICD-10-CM

## 2018-03-08 DIAGNOSIS — R269 Unspecified abnormalities of gait and mobility: Secondary | ICD-10-CM

## 2018-03-08 DIAGNOSIS — Z7952 Long term (current) use of systemic steroids: Secondary | ICD-10-CM | POA: Diagnosis not present

## 2018-03-08 DIAGNOSIS — R635 Abnormal weight gain: Secondary | ICD-10-CM

## 2018-03-08 DIAGNOSIS — Z9889 Other specified postprocedural states: Secondary | ICD-10-CM | POA: Diagnosis not present

## 2018-03-08 DIAGNOSIS — I639 Cerebral infarction, unspecified: Secondary | ICD-10-CM | POA: Diagnosis not present

## 2018-03-08 DIAGNOSIS — Z79899 Other long term (current) drug therapy: Secondary | ICD-10-CM | POA: Diagnosis not present

## 2018-03-08 DIAGNOSIS — G9389 Other specified disorders of brain: Secondary | ICD-10-CM

## 2018-03-08 DIAGNOSIS — F17210 Nicotine dependence, cigarettes, uncomplicated: Secondary | ICD-10-CM | POA: Diagnosis not present

## 2018-03-08 MED ORDER — DEXAMETHASONE 1 MG PO TABS: 1.0000 mg | ORAL_TABLET | Freq: Every day | ORAL | 0 refills | Status: AC

## 2018-03-08 MED ORDER — DEXAMETHASONE 2 MG PO TABS: 2.0000 mg | ORAL_TABLET | Freq: Every day | ORAL | 0 refills | Status: AC

## 2018-03-08 NOTE — Progress Notes (Signed)
[facility] [place], [place] [id number] [phone]   Interval Evaluation  Date of Service: 03/08/18 Patient Name: Tara [name] Patient MRN: [id number] Patient DOB: 01/30/1951 Provider: [name], MD  Identifying Statement:  AKYLA VAVREK is a 67 y.o. female with left frontal brain lesion   Interval History:  KAO BERKHEIMER presents today for follow up after recent MRI brain.  She describes no new or progressive neurologic symptoms.  She has occasional headaches which are mild/moderate.  She does acknowledge some weight gain and leg swelling with the steroids.  Gait is normal and memory impairment is modest as prior.  Continues to take decadron 4mg  daily.            Medications: Current Outpatient Medications on File Prior to Visit  Medication Sig Dispense Refill  . aspirin EC 81 MG tablet Take 1 tablet (81 mg total) by mouth daily.    Marland Kitchen dexamethasone (DECADRON) 4 MG tablet Take 1 tablet (4 mg total) by mouth every 12 (twelve) hours. 60 tablet 0  . levETIRAcetam (KEPPRA) 500 MG tablet Take 1 tablet (500 mg total) by mouth 2 (two) times daily. 60 tablet 0  . lisinopril (PRINIVIL,ZESTRIL) 2.5 MG tablet Take 1 tablet (2.5 mg total) by mouth at bedtime. 30 tablet 3  . Multiple Vitamin (MULTIVITAMIN WITH MINERALS) TABS tablet Take 1 tablet by mouth daily.     No current facility-administered medications on file prior to visit.     Allergies: No Known Allergies Past Medical History: No past medical history on file. Past Surgical History:  Past Surgical History:  Procedure Laterality Date  . APPLICATION OF CRANIAL NAVIGATION N/A 01/04/2018   Procedure: APPLICATION OF CRANIAL NAVIGATION;  Surgeon: Erline Levine, MD;  Location: [facility];  Service: Neurosurgery;  Laterality: N/A;  . PR DURAL GRAFT REPAIR,SPINE DEFECT Left 01/04/2018   Procedure: Left Pterional craniotomy for biopsy with Brainlab;  Surgeon: Erline Levine,  MD;  Location: [facility];  Service: Neurosurgery;  Laterality: Left;  Left Pterional craniotomy for biopsy with Brainlab   Social History:  Social History   Socioeconomic History  . Marital status: Married    Spouse name: Not on file  . Number of children: Not on file  . Years of education: Not on file  . Highest education level: Not on file  Occupational History  . Not on file  Social Needs  . Financial resource strain: Not on file  . Food insecurity:    Worry: Not on file    Inability: Not on file  . Transportation needs:    Medical: Not on file    Non-medical: Not on file  Tobacco Use  . Smoking status: Former Smoker    Packs/day: 1.00    Types: Cigarettes  . Smokeless tobacco: Never Used  . Tobacco comment: plans to quit now  Substance and Sexual Activity  . Alcohol use: No    Frequency: Never  . Drug use: No  . Sexual activity: Yes  Lifestyle  . Physical activity:    Days per week: Not on file    Minutes per session: Not on file  . Stress: Not on file  Relationships  . Social connections:    Talks on phone: Not on file    Gets together: Not on file    Attends religious service: Not on file    Active member of club or organization: Not on file    Attends  meetings of clubs or organizations: Not on file    Relationship status: Not on file  . Intimate partner violence:    Fear of current or ex partner: Not on file    Emotionally abused: Not on file    Physically abused: Not on file    Forced sexual activity: Not on file  Other Topics Concern  . Not on file  Social History Narrative  . Not on file   Family History:  Family History  Problem Relation Age of Onset  . Colon cancer Mother   . CAD Father     Review of Systems: Constitutional: Denies fevers, chills or abnormal weight loss Eyes: Denies blurriness of vision Ears, nose, mouth, throat, and face: Denies mucositis or sore throat Respiratory: Denies cough, dyspnea or wheezes Cardiovascular: Denies  palpitation, chest discomfort or lower extremity swelling Gastrointestinal:  Denies nausea, constipation, diarrhea GU: Denies dysuria or incontinence Skin: Denies abnormal skin rashes Neurological: Per HPI Musculoskeletal: Denies joint pain, back or neck discomfort. No decrease in ROM Behavioral/Psych: +anxiety  Physical Exam: Vitals:   03/08/18 1012 03/08/18 1013  BP: (!) 161/103 (!) 161/119  Pulse: 88   Resp: 20   Temp: 98.8 F (37.1 C)   SpO2: 96%    KPS: 90. General: Alert, cooperative, pleasant, in no acute distress Head: Normal EENT: No conjunctival injection or scleral icterus. Oral mucosa moist Lungs: Resp effort normal Cardiac: Regular rate and rhythm Abdomen: Soft, non-distended abdomen Skin: No rashes cyanosis or petechiae. Extremities: 1+ pitting edema bilateral lower legs  Neurologic Exam: Mental Status: Awake, alert, attentive to examiner. Oriented to self and environment. Language is fluent with intact comprehension.  Cranial Nerves: Visual acuity is grossly normal. Visual fields are full. Extra-ocular movements intact. No ptosis. Face is symmetric, tongue midline. Motor: Tone and bulk are normal. Power is full in both arms and legs. Reflexes are symmetric, no pathologic reflexes present. Intact finger to nose bilaterally Sensory: Intact to light touch and temperature Gait: Normal and tandem gait is normal.   Labs: I have reviewed the data as listed    Component Value Date/Time   NA 140 02/03/2018 1015   K 4.5 02/03/2018 1015   CL 103 02/03/2018 1015   CO2 24 02/03/2018 1015   GLUCOSE 80 02/03/2018 1015   GLUCOSE 118 (H) 01/10/2018 0738   BUN 20 02/03/2018 1015   CREATININE 0.56 (L) 02/03/2018 1015   CALCIUM 9.0 02/03/2018 1015   PROT 5.9 (L) 02/03/2018 1015   ALBUMIN 3.7 02/03/2018 1015   AST 15 02/03/2018 1015   ALT 48 (H) 02/03/2018 1015   ALKPHOS 88 02/03/2018 1015   BILITOT 0.4 02/03/2018 1015   GFRNONAA 97 02/03/2018 1015   GFRAA 112  02/03/2018 1015   Lab Results  Component Value Date   WBC 9.8 01/07/2018   NEUTROABS 8.3 (H) 01/07/2018   HGB 12.8 01/07/2018   HCT 39.3 01/07/2018   MCV 95.4 01/07/2018   PLT 330 01/07/2018    Imaging:  [facility] Clinician Interpretation: I have personally reviewed the CNS images as listed.  My interpretation, in the context of the patient's clinical presentation, is stable vascular disease vs lymphoma with steroid vanishing effect  Mr Jeri Cos Wo Contrast  Result Date: 03/02/2018 CLINICAL DATA:  Recent episode of acute aphasia and right-sided weakness. Left frontal brain mass. EXAM: MRI HEAD WITHOUT AND WITH CONTRAST TECHNIQUE: Multiplanar, multiecho pulse sequences of the brain and surrounding structures were obtained without and with intravenous contrast. CONTRAST:  96mL  MULTIHANCE GADOBENATE DIMEGLUMINE 529 MG/ML IV SOLN COMPARISON:  Brain MRI 01/03/2018 FINDINGS: BRAIN: The midline structures are normal. There is an area of hyperintense DWI signal within the left corona radiata, which is unchanged in distribution. There is no corresponding ADC abnormality. 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 There is a small, old left cerebellar infarct. The previously seen large volume left hemispheric edema has resolved. There is minimal contrast enhancement at the site of the diffusion abnormality. There is minimal residual contrast enhancement along the course of the left middle cerebral artery, but greatly decreased from the prior contrast enhanced examination. There is a small focus of contrast enhancement in the medial right frontal lobe (series 13, image 11). No age-advanced or lobar predominant atrophy. No chronic microhemorrhage or superficial siderosis. There is dural thickening underlying the left frontoparietal craniotomy site. VASCULAR: Major intracranial arterial and venous sinus flow voids are preserved. SKULL AND  UPPER CERVICAL SPINE: Status post left frontoparietal craniotomy. SINUSES/ORBITS: No fluid levels or advanced mucosal thickening. No mastoid or middle ear effusion. Normal orbits. IMPRESSION: 1. Near complete resolution of previously seen area of contrast enhancement along the left middle cerebral artery and sylvian fissure. There is a small amount of residual enhancement along the left MCA, but greatly decreased compared to the prior contrast-enhanced study. Encephalomalacia at the anterior left temporal lobe has developed since the prior scans, but the edema has resolved. No residual masslike features. 2. Small, new focus of contrast enhancement in the inferior medial right frontal lobe is indeterminate. This could be a residual manifestation of the presumed neoplastic process seen previously, or a post-ischemic finding. Continued attention is recommended with imaging follow-up. 3. Persistent hyperintensity on diffusion-weighted imaging in the left corona radiata is compatible with late subacute/early chronic infarct. Punctate focus of contrast enhancement is also compatible with recent ischemic event. Electronically Signed   By: Ulyses Jarred M.D.   On: 03/02/2018 05:40    Assessment/Plan Brain mass   Ms. Varble is clinically stable today. Her MRI was reviewed in multidisciplinary brain tumor board.  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  There is no clear clinical syndrome that is consistent with a systemic inflammatory syndrome such as lupus.   Recommendation at this time is for completion of steroid taper, and to re-image with brain MRI in ~2 months.  Will continue to maintain lines of communication with [facility] as this workup is ongoing.   Decadron should be decreased to 2mg  daily for 10 days,  then 1mg  daily for 10 days, then 1mg  QOD for 10 days, then stopped.     We appreciate the opportunity to participate in the care of [employer]   All questions were answered. The patient knows to call the clinic with any problems, questions or concerns. No barriers to learning were detected.  The total time spent in the encounter was 40 minutes and more than 50% was on counseling and review of test results   [name], MD Medical Director of Neuro-Oncology [facility] at [place] 03/08/18 9:53 AM

## 2018-03-08 NOTE — Progress Notes (Signed)
Subjective:    Patient ID: Tara [name], female    DOB: [birth year], 67 y.o.   MRN: [id number] 67 year old right-handed female with a history of tobacco abuse and no prescription medications, who lives with spouse, independent prior to admission.  She presented on December 31, 2017, with confusion, slurred speech, left frontal headache, and right-sided weakness.  CT MRI demonstrated an enhancing lesion surrounding T2 signal, consistent with a likely mass.  CT of the chest with no finding suspicious for malignancy.  CT abdomen and pelvis is unremarkable.  Noted seizure on January 02, 2018; loaded with Keppra; EEG with no seizure activity.  Followup MRI, January 03, 2018, showed a new area of acute infarction in the left corona radiata and external capsule; a large area of edema, left cerebral white matter, similar to recent MRI; and a 4 mm shift to the right, unchanged.  She underwent a left craniotomy as well as biopsy for a brain mass on January 04, 2018, per Dr. Vertell Limber.  Medical Oncology consulted.  Preliminary frozen pathology report was nondiagnostic, awaiting the formal pathology report.  Patient followup outpatient    HPI   Cooking and Mod I ADLs  No seizures , has not driven since hsopitalization  Has quit smoking   Has followed up with Neuro onc Dr Mickeal Skinner  Repeat MRI brain showed no evidence of tumor residual DWI brightness in left corona radiata  Pain Inventory Average Pain 0 Pain Right Now 0 My pain is na  In the last 24 hours, has pain interfered with the following? General activity 0 Relation with others 0 Enjoyment of life 0 What TIME of day is your pain at its worst? na Sleep (in general) Fair  Pain is worse with: na Pain improves with: na Relief from Meds: na  Mobility walk without assistance ability to climb steps?  yes do you drive?  no  Function retired  Neuro/Psych No problems in this area  Prior Studies CT/MRI CLINICAL DATA:  Recent episode  of acute aphasia and right-sided weakness. Left frontal brain mass.   EXAM: MRI HEAD WITHOUT AND WITH CONTRAST   TECHNIQUE: Multiplanar, multiecho pulse sequences of the brain and surrounding structures were obtained without and with intravenous contrast.   CONTRAST:  81mL MULTIHANCE GADOBENATE DIMEGLUMINE 529 MG/ML IV SOLN   COMPARISON:  Brain MRI 01/03/2018   FINDINGS: BRAIN: The midline structures are normal. There is an area of hyperintense DWI signal within the left corona radiata, which is unchanged in distribution. There is no corresponding ADC abnormality. 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 There is a small, old left cerebellar infarct. The previously seen large volume left hemispheric edema has resolved. There is minimal contrast enhancement at the site of the diffusion abnormality. There is minimal residual contrast enhancement along the course of the left middle cerebral artery, but greatly decreased from the prior contrast enhanced examination. There is a small focus of contrast enhancement in the medial right frontal lobe (series 13, image 11). No age-advanced or lobar predominant atrophy. No chronic microhemorrhage or superficial siderosis. There is dural thickening underlying the left frontoparietal craniotomy site.   VASCULAR: Major intracranial arterial and venous sinus flow voids are preserved.   SKULL AND UPPER CERVICAL SPINE: Status post left frontoparietal craniotomy.   SINUSES/ORBITS: No fluid levels or advanced mucosal thickening. No mastoid or middle ear effusion. Normal orbits.   IMPRESSION: 1. Near complete resolution of previously seen area  of contrast enhancement along the left middle cerebral artery and sylvian fissure. There is a small amount of residual enhancement along the left MCA, but greatly decreased compared to the prior contrast-enhanced study.  Encephalomalacia at the anterior left temporal lobe has developed since the prior scans, but the edema has resolved. No residual masslike features. 2. Small, new focus of contrast enhancement in the inferior medial right frontal lobe is indeterminate. This could be a residual manifestation of the presumed neoplastic process seen previously, or a post-ischemic finding. Continued attention is recommended with imaging follow-up. 3. Persistent hyperintensity on diffusion-weighted imaging in the left corona radiata is compatible with late subacute/early chronic infarct. Punctate focus of contrast enhancement is also compatible with recent ischemic event.     Electronically Signed  Physicians involved in your care Any changes since last visit?  no   Family History  Problem Relation Age of Onset  . Colon cancer Mother   . CAD Father    Social History   Socioeconomic History  . Marital status: Married    Spouse name: Not on file  . Number of children: Not on file  . Years of education: Not on file  . Highest education level: Not on file  Occupational History  . Not on file  Social Needs  . Financial resource strain: Not on file  . Food insecurity:    Worry: Not on file    Inability: Not on file  . Transportation needs:    Medical: Not on file    Non-medical: Not on file  Tobacco Use  . Smoking status: Former Smoker    Packs/day: 1.00    Types: Cigarettes  . Smokeless tobacco: Never Used  . Tobacco comment: plans to quit now  Substance and Sexual Activity  . Alcohol use: No    Frequency: Never  . Drug use: No  . Sexual activity: Yes  Lifestyle  . Physical activity:    Days per week: Not on file    Minutes per session: Not on file  . Stress: Not on file  Relationships  . Social connections:    Talks on phone: Not on file    Gets together: Not on file    Attends religious service: Not on file    Active member of club or organization: Not on file    Attends  meetings of clubs or organizations: Not on file    Relationship status: Not on file  Other Topics Concern  . Not on file  Social History Narrative  . Not on file   Past Surgical History:  Procedure Laterality Date  . APPLICATION OF CRANIAL NAVIGATION N/A 01/04/2018   Procedure: APPLICATION OF CRANIAL NAVIGATION;  Surgeon: Erline Levine, MD;  Location: [facility];  Service: Neurosurgery;  Laterality: N/A;  . PR DURAL GRAFT REPAIR,SPINE DEFECT Left 01/04/2018   Procedure: Left Pterional craniotomy for biopsy with Brainlab;  Surgeon: Erline Levine, MD;  Location: [facility];  Service: Neurosurgery;  Laterality: Left;  Left Pterional craniotomy for biopsy with Brainlab   No past medical history on file. BP (!) 146/99   Pulse 77   Ht 5\' 6"  (1.676 m)   Wt 231 lb (104.8 kg)   SpO2 95%   BMI 37.28 kg/m   Opioid Risk Score:   Fall Risk Score:  `1  Depression screen PHQ 2/9  Depression screen PHQ 2/9 02/03/2018  Decreased Interest 0  Down, Depressed, Hopeless 0  PHQ - 2 Score 0     Review of  Systems  Constitutional: Negative.   HENT: Negative.   Eyes: Negative.   Respiratory: Negative.   Cardiovascular:       Hypertension  Gastrointestinal: Negative.   Endocrine: Negative.   Genitourinary: Negative.   Musculoskeletal: Negative.   Skin: Negative.   Allergic/Immunologic: Negative.   Neurological: Negative.   Hematological: Negative.   Psychiatric/Behavioral: Negative.   All other systems reviewed and are negative.      Objective:   Physical Exam  Constitutional: She is oriented to person, place, and time. She appears well-developed and well-nourished.  HENT:  Head: Normocephalic and atraumatic.  Eyes: Pupils are equal, round, and reactive to light. EOM are normal.  Neurological: She is alert and oriented to person, place, and time. No cranial nerve deficit. She exhibits normal muscle tone. Coordination abnormal.  Mild word finding deficits. Motor strength is 4+ in the right deltoid  bicep tricep grip hip flexor knee extensor ankle dorsiflexor 5/5 in the left deltoid bicep tricep grip hip flexor knee extensor ankle dorsiflexor.  Gait has intact toe walk as well as heel walk but cannot perform tandem gait.          Assessment & Plan:  #1.  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  We discussed that deficits should continue to improve over time and that she is only about 2 months into her recovery.  We expect the plateau around 6 months. She will finish out her outpatient therapy at the end of the month.  No physical medicine rehab follow-up needed  Given history of seizure, will need to get the okay from Dr. Mickeal Skinner to resume driving at least 6 months post most recent seizure.

## 2018-03-09 ENCOUNTER — Ambulatory Visit: Payer: Medicare Other

## 2018-03-09 ENCOUNTER — Ambulatory Visit: Payer: Medicare Other | Admitting: Occupational Therapy

## 2018-03-09 ENCOUNTER — Encounter: Payer: Self-pay | Admitting: Physical Therapy

## 2018-03-09 ENCOUNTER — Ambulatory Visit: Payer: Medicare Other | Admitting: Physical Therapy

## 2018-03-09 VITALS — BP 147/109

## 2018-03-09 VITALS — BP 138/106

## 2018-03-09 DIAGNOSIS — M6281 Muscle weakness (generalized): Secondary | ICD-10-CM | POA: Diagnosis not present

## 2018-03-09 DIAGNOSIS — R41841 Cognitive communication deficit: Secondary | ICD-10-CM | POA: Diagnosis not present

## 2018-03-09 DIAGNOSIS — R2681 Unsteadiness on feet: Secondary | ICD-10-CM

## 2018-03-09 DIAGNOSIS — R4701 Aphasia: Secondary | ICD-10-CM | POA: Diagnosis not present

## 2018-03-09 DIAGNOSIS — R278 Other lack of coordination: Secondary | ICD-10-CM

## 2018-03-09 DIAGNOSIS — R262 Difficulty in walking, not elsewhere classified: Secondary | ICD-10-CM | POA: Diagnosis not present

## 2018-03-09 DIAGNOSIS — R41840 Attention and concentration deficit: Secondary | ICD-10-CM

## 2018-03-09 NOTE — Therapy (Signed)
[facility] [address] [place], [place], [address] Phone: [phone]   Fax:  [phone]  Speech Language Pathology Treatment  Patient Details  Name: Tara [name] MRN: [id number] Date of Birth: 1950/11/03 Referring Provider: Dr. Alysia Penna   Encounter Date: 03/09/2018  End of Session - 03/09/18 1236    Visit Number  8    Number of Visits  17    Date for SLP Re-Evaluation  03/18/18    SLP Start Time  1103    SLP Stop Time   1145    SLP Time Calculation (min)  42 min    Activity Tolerance  Patient tolerated treatment well       History reviewed. No pertinent past medical history.  Past Surgical History:  Procedure Laterality Date  . APPLICATION OF CRANIAL NAVIGATION N/A 01/04/2018   Procedure: APPLICATION OF CRANIAL NAVIGATION;  Surgeon: Erline Levine, MD;  Location: [facility];  Service: Neurosurgery;  Laterality: N/A;  . PR DURAL GRAFT REPAIR,SPINE DEFECT Left 01/04/2018   Procedure: Left Pterional craniotomy for biopsy with Brainlab;  Surgeon: Erline Levine, MD;  Location: [facility];  Service: Neurosurgery;  Laterality: Left;  Left Pterional craniotomy for biopsy with Brainlab    There were no vitals filed for this visit.  Subjective Assessment - 03/09/18 1114    Subjective  "My memory is not as good as it was. My short term memory."    Currently in Pain?  No/denies            ADULT SLP TREATMENT - 03/09/18 1115      General Information   Behavior/Cognition  Alert;Cooperative;Pleasant mood;Impulsive;Distractible looking out the window approx every 3 minutes      Treatment Provided   Treatment provided  Cognitive-Linquistic      Cognitive-Linquistic Treatment   Treatment focused on  Cognition    Skilled Treatment  Pt provides vague details of the visit with Dr. Mickeal Skinner yesterday (e.g., "He said it would just be there after like 6 months. You can't get it back.") It took min questioning cues and then  SLP looking at pt's chart to achieve clarification. SLP asked pt what her homework was and pt did not recall. SLP had to provide total A, and pt said she started it but it was at home. SLP asked what memory strategies were - "WARM". Pt did not recall specifics, and stated her "S" statement. SLP reviewed memory strategies and provided examples. Pt stated she would just say them over and over, and SLP told pt 10 minutes earlier she said that probably wouldn't work. So pt made note in "notes" app. She remembered 2/4 15 minutes later and req'd cues to look at her "notes" app for the other two strategies. Pt was told to make sure to bring her homework from previous session to her next session Monday 03-14-18.      Assessment / Recommendations / Plan   Plan  Continue with current plan of care      Progression Toward Goals   Progression toward goals  Progressing toward goals       SLP Education - 03/09/18 1235    Education Details  memories strategies, need to write things down instead of just "remember them"    Person(s) Educated  Patient    Methods  Explanation;Demonstration    Comprehension  Verbalized understanding;Verbal cues required;Need further instruction       SLP Short Term Goals - 03/09/18 1237  SLP SHORT TERM GOAL #1   Title  Pt will complete moderately complex naming tasks with occasional min A over 3 sessions.    Status  Deferred      SLP SHORT TERM GOAL #2   Title  Pt will utilize compensations for aphasia in structured naming tasks 4/5 opportunites with occasional min A over 2 sessions    Status  Deferred      SLP SHORT TERM GOAL #3   Title  Pt will utiltize compensations for aphasia over 10 minute modconversation 3/5 opportunites with occasional min A oer 2 sessions    Status  Deferred      SLP SHORT TERM GOAL #4   Title  Written expression/reading comprehension assessment if indicated    Status  Achieved      SLP SHORT TERM GOAL #5   Title  pt will tell  SLP/indicate 4 memory strategies she could use to improve memory over two sessions    Status  Not Met       SLP Long Term Goals - 03/09/18 1237      SLP LONG TERM GOAL #1   Title  Pt will complete complex naming tasks with rare min A over 3 sessions    Status  Deferred      SLP LONG TERM GOAL #2   Title  Pt will utilize compensations for aphasia over 15 minute complex conversation with rare min A over 2 sessions.    Time  9    Period  Weeks    Status  On-going      SLP LONG TERM GOAL #3   Title  pt will utilize at least one memory strategy in 5 therapy sessions    Time  5    Period  Weeks    Status  On-going      SLP LONG TERM GOAL #4   Title  pt will note her errors in detailed linguistic tasks, when cued to double check her responses, 100% of the time    Time  5    Period  Weeks    Status  On-going       Plan - 03/09/18 1236    Clinical Impression Statement  Pt demo'd anomia for items and names today. Pt also demo'd decr'd memory skills - see "skilled interventions" for details.  I recommend skilled ST to maximize compensations for mild verbal expression deficits and for improved cognitive linguistic skills to promote incr'd independence and QOL.     Speech Therapy Frequency  2x / week    Treatment/Interventions  Cognitive reorganization;Functional tasks;Compensatory strategies;SLP instruction and feedback;Patient/family education;Internal/external aids;Environmental controls;Cueing hierarchy    Potential to Achieve Goals  Good    Consulted and Agree with Plan of Care  Patient       Patient will benefit from skilled therapeutic intervention in order to improve the following deficits and impairments:   Cognitive communication deficit  Aphasia    Problem List Patient Active Problem List   Diagnosis Date Noted  . 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 Stroke ([facility])  01/21/2018  . Reactive hypertension   . Hypoalbuminemia due to protein-calorie malnutrition ([facility])   . Seizures ([facility])   . Elevated BUN   . Brain mass 01/06/2018  . Aphasia   . Abnormal CT of the head   . CNS mass 01/01/2018  . Acute metabolic encephalopathy 43/15/4008  . Headache 12/31/2017  .  Hypertension 12/31/2017  . Hyperglycemia 12/31/2017  . Confusion 12/31/2017    [facility] ,[place], CCC-SLP  03/09/2018, 12:38 PM  [facility] [address] [place], [place], [address] Phone: [phone]   Fax:  [phone]   Name: Tara [name] MRN: [id number] Date of Birth: 06-22-1951

## 2018-03-09 NOTE — Patient Instructions (Signed)
  Please complete the assigned speech therapy homework prior to your next session and return it to the speech therapist at your next visit.  

## 2018-03-09 NOTE — Therapy (Signed)
[name] [address] [place], [place], [address] Phone: [phone]   Fax:  [phone]  Occupational Therapy Treatment  Patient Details  Name: Tara [name] MRN: [id number] Date of Birth: [birth year] Referring Provider: Alysia [name]   Encounter Date: 03/09/2018  OT End of Session - 03/09/18 1036    Visit Number  11    Number of Visits  25    Date for OT Re-Evaluation  04/19/18    Authorization Type  Medicare    Authorization Time Period  goals written for 12 weeks, may d/c after 8 weeks depending on progress    OT Start Time  1020    OT Stop Time  1100    OT Time Calculation (min)  40 min    Activity Tolerance  Patient tolerated treatment well    Behavior During Therapy  [name] for tasks assessed/performed       No past medical history on file.  Past Surgical History:  Procedure Laterality Date  . APPLICATION OF CRANIAL NAVIGATION N/A 01/04/2018   Procedure: APPLICATION OF CRANIAL NAVIGATION;  Surgeon: Tara Levine, MD;  [facility]: [name];  Service: Neurosurgery;  Laterality: N/A;  . PR DURAL GRAFT REPAIR,SPINE DEFECT Left 01/04/2018   Procedure: Left Pterional craniotomy for biopsy with Brainlab;  Surgeon: Tara Levine, MD;  [facility]: [name];  Service: Neurosurgery;  Laterality: Left;  Left Pterional craniotomy for biopsy with Brainlab    Vitals:   03/09/18 1029  BP: (!) 153/102    Subjective Assessment - 03/09/18 1023    Pertinent History   seizure 01/02/2018 ,craniotomy as well as biopsy for brain mass 01/04/2018 per Dr. Vertell [name], CVA- pt is still awating results of biopsy    Patient Stated Goals  to get back my independence    Currently in Pain?  No/denies            Treatment: Pt's BP was elevated following PT. Seated at table therapist discussed neuropysch for counseling with patient as pt. Reports she is worried about her diagnosis and she doesn't want to let her children know that she is  worried. Pt was provided with Tara Reichmann [name]'s name and pt was told she could call and ask Dr. Letta [name] for a referral if she decides she is interested. Fine motor coordination task placing grooved pegs into pegboard with RUE with tweezers for 1/2 items, then with fingers tips due to difficulty, removing with tweezers for sustained pinch. BP remained elevated so tx was ended early.                  OT Short Term Goals - 03/01/18 1028      OT SHORT TERM GOAL #1   Title  I with HEP    Status  Achieved      OT SHORT TERM GOAL #2   Title  Pt will perform basic home management/ cooking with distant supervision demonstrating good safety awareness.    Status  Achieved      OT SHORT TERM GOAL #3   Title  Pt will demonstrate improved fine motor coordination for ADLs as evidenced by decreasing 9 hole peg test score by 3 secs for RUE.    Status  Achieved 23.25 sec      OT SHORT TERM GOAL #4   Title  Pt will perform a basic functional organization task with no more than 2 v.c    Status  Achieved      OT SHORT TERM GOAL #5  Title  Pt will demonstrate ability to alternate attention between 2 functional tasks with 80% or better accuracy.    Status  Achieved        OT Long Term Goals - 03/07/18 1025      OT LONG TERM GOAL #1   Title  I with updated HEP.  (Pended)     Status  On-going  (Pended)       OT LONG TERM GOAL #2   Title  Pt will perform mod complex home management and cooking at a modified independent level demonstrating good safety awareness.  (Pended)     Status  On-going  (Pended)       OT LONG TERM GOAL #3   Title   Pt will perform a moderate complex organization task independently  (Pended)     Status  Achieved  (Pended)       OT LONG TERM GOAL #4   Title  Pt will demonstrate ability to divide attention between a physical and cognitive task with 90% or better accuracy.  (Pended)     Status  On-going  (Pended)             Plan - 03/09/18 1039     Clinical Impression Statement  Pt is progressing towards goals. Her BP has been elevated, MD attributes to steroids.    Occupational Profile and client history currently impacting functional performance  Tara J Matthewsis a 67 y.o.right handed femalewith history of tobacco abuse on no prescription medications. Pt. has an [employer]. Pt is retired and lives with spouse and was independent prior to admission. Two-level home with bedroom on main level. Spouse works during the day but can stay home as needed    Rehab Potential  Good    Current Impairments/barriers affecting progress:  decreased awareness of deficits - pt to have follow up CT next week and see specialist at [name] Frequency  2x / week    OT Duration  12 weeks    OT Treatment/Interventions  Self-care/ADL training;Therapeutic exercise;Patient/family education;Neuromuscular education;Moist Heat;Paraffin;Fluidtherapy;Therapist, nutritional;Therapeutic activities;Cognitive remediation/compensation;Passive range of motion;Manual Therapy;DME and/or AE instruction;Ultrasound;Cryotherapy    Plan   alternating attention, environmental scanning with a cognitive component,     Consulted and Agree with Plan of Care  Patient       Patient will benefit from skilled therapeutic intervention in order to improve the following deficits and impairments:  Abnormal gait, Decreased cognition, Impaired vision/preception, Decreased mobility, Decreased coordination, Decreased activity tolerance, Decreased endurance, Decreased range of motion, Decreased strength, Impaired UE functional use, Difficulty walking, Impaired perceived functional ability, Decreased safety awareness, Decreased knowledge of precautions, Decreased balance  Visit Diagnosis: Other lack of coordination  Attention and concentration deficit  Muscle weakness (generalized)  Frontal lobe and executive function deficit    Problem List Patient Active Problem List   Diagnosis Date  Noted  . 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 Stroke ([name]) 01/21/2018  . Reactive hypertension   . Hypoalbuminemia due to protein-calorie malnutrition ([name])   . Seizures ([facility])   . Elevated BUN   . Brain mass 01/06/2018  . Aphasia   . Abnormal CT of the head   . CNS mass 01/01/2018  . Acute metabolic encephalopathy 43/32/9518  . Headache 12/31/2017  . Hypertension 12/31/2017  . Hyperglycemia 12/31/2017  . Confusion 12/31/2017    RINE,KATHRYN 03/09/2018, 10:48 AM  [facility]  [facility] [address] [place], [place], [address] Phone: [phone]   Fax:  [phone]  Name: Tara [name] MRN: [id number] Date of Birth: 1951-08-05

## 2018-03-10 NOTE — Addendum Note (Signed)
Addended by: Rexanne Mano on: 03/10/2018 07:37 PM   Modules accepted: Orders

## 2018-03-10 NOTE — Therapy (Addendum)
[facility] [address] [place], [place], [address] Phone: [phone]   Fax:  [phone]  Physical Therapy Treatment  Patient Details  Name: Tara [name] MRN: [id number] Date of Birth: [birth year] Referring Provider: Alysia Penna   Encounter Date: 03/09/2018      PT End of Session - 03/09/18 0944    Visit Number  11    Number of Visits  19   Date for PT Re-Evaluation  05/08/18    Authorization Type  MCR    Authorization Time Period  01/19/18 to 03/20/18 ;  03/09/18 to 05/08/18   PT Start Time  0935    PT Stop Time  1015    PT Time Calculation (min)  40 min    Equipment Utilized During Treatment  Gait belt    Activity Tolerance  Patient tolerated treatment well    Behavior During Therapy  [facility] for tasks assessed/performed       History reviewed. No pertinent past medical history.  Past Surgical History:  Procedure Laterality Date  . APPLICATION OF CRANIAL NAVIGATION N/A 01/04/2018   Procedure: APPLICATION OF CRANIAL NAVIGATION;  Surgeon: Erline Levine, MD;  Location: [facility];  Service: Neurosurgery;  Laterality: N/A;  . PR DURAL GRAFT REPAIR,SPINE DEFECT Left 01/04/2018   Procedure: Left Pterional craniotomy for biopsy with Brainlab;  Surgeon: Erline Levine, MD;  Location: [facility];  Service: Neurosurgery;  Laterality: Left;  Left Pterional craniotomy for biopsy with Brainlab    Vitals:   03/09/18 0937  BP: (!) 147/109    Subjective Assessment - 03/09/18 0937    Subjective  Saw Dr. Mickeal Skinner yesterday. They do not know what is going on, no diagnosis. Plan to begin weaing the steriods to see if it occurs again (a wait and see). He also feels the elevated BP is due to the medications, hoping the steroid taper with address this and does not want to change any BP meds at this time. Saw Dr. Letta Pate as well who feels overall she is doing well and has released her unless she needs him.,     Patient is accompained by:   Family member    Pertinent History  seixure d/o, HTN    Patient Stated Goals  Be able to help my daughter with her baby ( due in one month, [place], [place])    Currently in Pain?  No/denies    Pain Score  0-No pain         [facility] PT Assessment - 03/09/18 0945      6 Minute Walk- Baseline   6 Minute Walk- Baseline  yes    BP (mmHg)  111/92  (Abnormal)     HR (bpm)  85    02 Sat (%RA)  95 %    Modified Borg Scale for Dyspnea  0- Nothing at all    Perceived Rate of Exertion (Borg)  6-      6 Minute walk- Post Test   6 Minute Walk Post Test  yes    BP (mmHg)  128/108  (Abnormal)  at 9:58, 146/95 10:06    HR (bpm)  86    02 Sat (%RA)  98 %    Modified Borg Scale for Dyspnea  1- Very mild shortness of breath    Perceived Rate of Exertion (Borg)  7- Very, very light      6 minute walk test results    Aerobic Endurance Distance Walked  1037    Endurance additional  comments  no AD, no rest breaks taken. pt carried on a conversation through out walk as well.            Balance Exercises - 03/09/18 1007      Balance Exercises: Standing   Standing Eyes Closed  Wide (BOA);Head turns;Foam/compliant surface;Other reps (comment);30 secs;Limitations      Balance Exercises: Standing   Standing Eyes Closed Limitations  on folded dense blue foam mat in corner with chair in front for safety: EC no head movements, progressing to EC head movements left<>right and up<>down. min guard assist to min assist with posterior/right lateral sway with balance losses.                            PT Short Term Goals - 01/31/18 1939      PT SHORT TERM GOAL #1   Title  Complete 6 MWT. Update LTG if appropriate. (Target 02/04/18)    Baseline  01/31/18 met    Status  Achieved         PT Long Term Goals - 03/09/18 1911      PT LONG TERM GOAL #1   Title  Patient will be independent with HEP and able to verbalize a plan for continued community-based activity upon d/c from PT (Target for all LTGs  03/11/18--due to delayed start by 1.5 wks based on census) (Target for ongoing goals 04/01/18)    Baseline  03/10/18 Patient wants to bring in pictures of the equipment available at the gym she will be using on discharge from PT and be educated on appropriate equipment for her to use.     Time  3    Period  Weeks    Status  On-going    Target Date  04/01/18      PT LONG TERM GOAL #2   Title  Patient will score >=22/30 on FGA to demonstrate improved balance and lesser fall risk.     Baseline  17/30; 03/07/18 23/30    Status  Achieved      PT LONG TERM GOAL #3   Title  Patient will improve 6 MWT to 1250 ft, progressing to an appropriate distance for her age to demonstrate improved gait, balance, and endurance.     Baseline  01/31/18 1050 ft (age based norm 1765 ft)   03/09/18 1037 ft    Time  3    Period  Weeks    Status  On-going      PT LONG TERM GOAL #4   Title  Patient will ambulate modified independently 200 ft over unlevel outdoor terrain while engaged in cognitive challenge/dual task activity.      Baseline  5/6 see notes; met    Status  Achieved      PT LONG TERM GOAL #5   Title  Patient will score >=25/30 on FGA to demonstrate improved balance and lesser fall risk.     Baseline  03/07/18  23/30    Time  3    Period  Weeks    Status  New          Plan - 03/09/18 0944    Clinical Impression Statement  Today's skilled session focused on remaining LTGs with pt not meeting the goal. Her distance was less today than on the original test for the 6 minute walk test. Pt also has not brought in pictures of gym she would be using to work out in for community  fitness. Due to this and minor remaining balance issues will continue for 2 more weeks to address balance, endurance and community fitness (PT had previously okayed renewal vs discharge) as pt's BP allows.                            Rehab Potential  Good    Clinical Impairments Affecting Rehab Potential  ? degree of cognitive deficits (she  downplays and ?expressive difficulties make it difficult to discern cognition vs language deficit)    PT Frequency  2x / week    PT Duration  6 weeks    PT Treatment/Interventions  ADLs/Self Care Home Management;Gait training;Cognitive remediation;Neuromuscular re-education;Balance training;Therapeutic exercise;Therapeutic activities;Functional mobility training;Stair training;Patient/family education;Passive range of motion    PT Next Visit Plan  recert for 2 more weeks to address endurance, balance and community fitness plans post rehab    Consulted and Agree with Plan of Care  Patient       Patient will benefit from skilled therapeutic intervention in order to improve the following deficits and impairments:  Decreased balance, Decreased cognition, Decreased strength, Decreased safety awareness, Difficulty walking, Postural dysfunction  Visit Diagnosis: Unsteadiness on feet  Muscle weakness (generalized)  Difficulty in walking, not elsewhere classified     Problem List Patient Active Problem List   Diagnosis Date Noted  . 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 Stroke ([facility]) 01/21/2018  . Reactive hypertension   . Hypoalbuminemia due to protein-calorie malnutrition ([facility])   . Seizures ([facility])   . Elevated BUN   . Brain mass 01/06/2018  . Aphasia   . Abnormal CT of the head   . CNS mass 01/01/2018  . Acute metabolic encephalopathy 55/25/8948  . Headache 12/31/2017  . Hypertension 12/31/2017  . Hyperglycemia 12/31/2017  . Confusion 12/31/2017    Willow Ora, PTA, [facility] [address] [place], [place] [address] [phone] 03/10/18, 2:18 PM      Name: Tara [name] MRN: [id number] Date of Birth: 05-07-1951

## 2018-03-11 ENCOUNTER — Ambulatory Visit: Payer: Medicare Other | Admitting: Internal Medicine

## 2018-03-11 ENCOUNTER — Telehealth: Payer: Self-pay | Admitting: *Deleted

## 2018-03-11 NOTE — Telephone Encounter (Signed)
Called mobile number and Lm to return call to me. Lelan Pons PV

## 2018-03-11 NOTE — Telephone Encounter (Signed)
Yes please schedule office visit. Thanks

## 2018-03-11 NOTE — Telephone Encounter (Signed)
Dr Silverio Decamp,  This pt is scheduled for a direct colon with you on 04-12-2018.   On March 1 .2019 this pt went to the Ed for confusion, headache. She has since had a seizure it looks like 3-5 and had a brain biopsy with Dr Vertell Limber , and readmitted 3-7. She saw ONC 3-21 and path was reviewed of the brain bx and it appears negative however onc referred her to [facility] for a second opinion.  She also at some point since believe she had a stroke.  Do you want her to have an OV with you before we do this colon?   Please advise,  Marijean Niemann

## 2018-03-14 ENCOUNTER — Encounter: Payer: Self-pay | Admitting: Physical Therapy

## 2018-03-14 ENCOUNTER — Other Ambulatory Visit: Payer: Self-pay

## 2018-03-14 ENCOUNTER — Ambulatory Visit (INDEPENDENT_AMBULATORY_CARE_PROVIDER_SITE_OTHER): Payer: Medicare Other | Admitting: Family Medicine

## 2018-03-14 ENCOUNTER — Encounter: Payer: Self-pay | Admitting: Family Medicine

## 2018-03-14 ENCOUNTER — Ambulatory Visit: Payer: Medicare Other | Admitting: Physical Therapy

## 2018-03-14 ENCOUNTER — Ambulatory Visit: Payer: Medicare Other | Admitting: Speech Pathology

## 2018-03-14 ENCOUNTER — Ambulatory Visit: Payer: Medicare Other | Admitting: Occupational Therapy

## 2018-03-14 VITALS — BP 153/106 | HR 76

## 2018-03-14 VITALS — BP 165/114

## 2018-03-14 VITALS — BP 155/98 | HR 92 | Temp 97.3°F | Resp 16 | Ht 66.0 in | Wt 227.2 lb

## 2018-03-14 DIAGNOSIS — R41841 Cognitive communication deficit: Secondary | ICD-10-CM

## 2018-03-14 DIAGNOSIS — R41840 Attention and concentration deficit: Secondary | ICD-10-CM

## 2018-03-14 DIAGNOSIS — R4701 Aphasia: Secondary | ICD-10-CM

## 2018-03-14 DIAGNOSIS — R41844 Frontal lobe and executive function deficit: Secondary | ICD-10-CM

## 2018-03-14 DIAGNOSIS — Z23 Encounter for immunization: Secondary | ICD-10-CM

## 2018-03-14 DIAGNOSIS — Z7952 Long term (current) use of systemic steroids: Secondary | ICD-10-CM | POA: Diagnosis not present

## 2018-03-14 DIAGNOSIS — Z1159 Encounter for screening for other viral diseases: Secondary | ICD-10-CM

## 2018-03-14 DIAGNOSIS — R2681 Unsteadiness on feet: Secondary | ICD-10-CM | POA: Diagnosis not present

## 2018-03-14 DIAGNOSIS — Z1211 Encounter for screening for malignant neoplasm of colon: Secondary | ICD-10-CM

## 2018-03-14 DIAGNOSIS — M6281 Muscle weakness (generalized): Secondary | ICD-10-CM

## 2018-03-14 DIAGNOSIS — Z124 Encounter for screening for malignant neoplasm of cervix: Secondary | ICD-10-CM | POA: Diagnosis not present

## 2018-03-14 DIAGNOSIS — Z780 Asymptomatic menopausal state: Secondary | ICD-10-CM | POA: Diagnosis not present

## 2018-03-14 DIAGNOSIS — Z1239 Encounter for other screening for malignant neoplasm of breast: Secondary | ICD-10-CM

## 2018-03-14 DIAGNOSIS — Z1231 Encounter for screening mammogram for malignant neoplasm of breast: Secondary | ICD-10-CM | POA: Diagnosis not present

## 2018-03-14 DIAGNOSIS — R278 Other lack of coordination: Secondary | ICD-10-CM | POA: Diagnosis not present

## 2018-03-14 DIAGNOSIS — Z0000 Encounter for general adult medical examination without abnormal findings: Secondary | ICD-10-CM | POA: Diagnosis not present

## 2018-03-14 DIAGNOSIS — R262 Difficulty in walking, not elsewhere classified: Secondary | ICD-10-CM | POA: Diagnosis not present

## 2018-03-14 NOTE — Therapy (Signed)
[facility] [address] [place], [place], [address] Phone: [phone]   Fax:  [phone]  Occupational Therapy Treatment  Patient Details  Name: Tara [name] MRN: [id number] Date of Birth: February 20, 1951 Referring Provider: Alysia Penna   Encounter Date: 03/14/2018  OT End of Session - 03/14/18 1037    Visit Number  12    Number of Visits  25    Date for OT Re-Evaluation  04/19/18    Authorization Type  Medicare    Authorization Time Period  goals written for 12 weeks, may d/c after 8 weeks depending on progress    OT Start Time  1017    OT Stop Time  1045    OT Time Calculation (min)  28 min    Activity Tolerance  Patient tolerated treatment well    Behavior During Therapy  [facility] for tasks assessed/performed       No past medical history on file.  Past Surgical History:  Procedure Laterality Date  . APPLICATION OF CRANIAL NAVIGATION N/A 01/04/2018   Procedure: APPLICATION OF CRANIAL NAVIGATION;  Surgeon: Erline Levine, MD;  Location: [facility];  Service: Neurosurgery;  Laterality: N/A;  . PR DURAL GRAFT REPAIR,SPINE DEFECT Left 01/04/2018   Procedure: Left Pterional craniotomy for biopsy with Brainlab;  Surgeon: Erline Levine, MD;  Location: [facility];  Service: Neurosurgery;  Laterality: Left;  Left Pterional craniotomy for biopsy with Brainlab    Vitals:   03/14/18 1022 03/14/18 1217  BP: (!) 150/100 (!) 165/114    Subjective Assessment - 03/14/18 1036    Subjective   Pt denies pain    Pertinent History   seizure 01/02/2018 ,craniotomy as well as biopsy for brain mass 01/04/2018 per Dr. Vertell Limber, CVA- pt is still awating results of biopsy    Patient Stated Goals  to get back my independence    Currently in Pain?  No/denies                 Treatment: Mod complex organization task in a busy environment to plan a breakfast for 14 within a specific budget. BP monitored before and after. Pt completed task  correctly following the specific instructions. Pt's BP was elevated following task and therefore therapy was ended early. Note sent with pt. To PCP.            OT Short Term Goals - 03/14/18 1038      OT SHORT TERM GOAL #1   Title  I with HEP    Status  Achieved      OT SHORT TERM GOAL #2   Title  Pt will perform basic home management/ cooking with distant supervision demonstrating good safety awareness.    Status  Achieved      OT SHORT TERM GOAL #3   Title  Pt will demonstrate improved fine motor coordination for ADLs as evidenced by decreasing 9 hole peg test score by 3 secs for RUE.    Status  Achieved 23.25 sec      OT SHORT TERM GOAL #4   Title  Pt will perform a basic functional organization task with no more than 2 v.c    Status  Achieved      OT SHORT TERM GOAL #5   Title  Pt will demonstrate ability to alternate attention between 2 functional tasks with 80% or better accuracy.    Status  Achieved        OT Long Term Goals - 03/14/18 1023  OT LONG TERM GOAL #1   Title  I with updated HEP.    Status  On-going      OT LONG TERM GOAL #2   Title  Pt will perform mod complex home management and cooking at a modified independent level demonstrating good safety awareness.    Status  Achieved      OT LONG TERM GOAL #3   Title   Pt will perform a moderate complex organization task independently Upgraded -Pt will perform a mod complex organization task  including specific details  in a busy environment     Status  Revised      OT LONG TERM GOAL #4   Title  Pt will demonstrate ability to divide attention between a physical and cognitive task with 90% or better accuracy.            Plan - 03/14/18 1219    Clinical Impression Statement  Pt is progressing towards goals. Pt's elevated BP is limiting her ability to participate in therapy. Note sent with pt to PCP today, seesion was ended early.    Occupational Profile and client history currently impacting  functional performance  Zeffie J Matthewsis a 67 y.o.right handed femalewith history of tobacco abuse on no prescription medications. Pt. has an [employer]. Pt is retired and lives with spouse and was independent prior to admission. Two-level home with bedroom on main level. Spouse works during the day but can stay home as needed    Occupational performance deficits (Please refer to evaluation for details):  IADL's;ADL's;Leisure;Social Participation    Rehab Potential  Good    Current Impairments/barriers affecting progress:  decreased awareness of deficits - pt to have follow up CT next week and see specialist [facility]    OT Frequency  2x / week    OT Duration  12 weeks    OT Treatment/Interventions  Self-care/ADL training;Therapeutic exercise;Patient/family education;Neuromuscular education;Moist Heat;Paraffin;Fluidtherapy;Therapist, nutritional;Therapeutic activities;Cognitive remediation/compensation;Passive range of motion;Manual Therapy;DME and/or AE instruction;Ultrasound;Cryotherapy    Plan   alternating attention, environmental scanning with a cognitive component, anticipate d/c in the next several visits    Consulted and Agree with Plan of Care  Patient    Family Member Consulted  son       Patient will benefit from skilled therapeutic intervention in order to improve the following deficits and impairments:  Abnormal gait, Decreased cognition, Impaired vision/preception, Decreased mobility, Decreased coordination, Decreased activity tolerance, Decreased endurance, Decreased range of motion, Decreased strength, Impaired UE functional use, Difficulty walking, Impaired perceived functional ability, Decreased safety awareness, Decreased knowledge of precautions, Decreased balance  Visit Diagnosis: Attention and concentration deficit  Frontal lobe and executive function deficit    Problem List Patient Active Problem List   Diagnosis Date Noted  . 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 Stroke ([place]) 01/21/2018  . Reactive hypertension   . Hypoalbuminemia due to protein-calorie malnutrition ([place])   . Seizures ([place])   . Elevated BUN   . Brain mass 01/06/2018  . Aphasia   . Abnormal CT of the head   . CNS mass 01/01/2018  . Acute metabolic encephalopathy 05/39/7673  . Headache 12/31/2017  . Hypertension 12/31/2017  . Hyperglycemia 12/31/2017  . Confusion 12/31/2017    RINE,KATHRYN 03/14/2018, 12:21 PM Theone Murdoch, OTR/L [phone] Phone: [phone] 12:23 PM 03/14/18 [facility] [address] [place], [place], [address] Phone: [phone]  Fax:  [phone]  Name: JENNE SELLINGER MRN: [id number] Date of Birth: 1951-09-19

## 2018-03-14 NOTE — Therapy (Signed)
[facility] [address] [place], [place], [address] Phone: [phone]   Fax:  [phone]  Speech Language Pathology Treatment  Patient Details  Name: Tara [name] MRN: [id number] Date of Birth: 1951/10/30 Referring Provider: Dr. Alysia Penna   Encounter Date: 03/14/2018  End of Session - 03/14/18 1250    Visit Number  9    Number of Visits  17    Date for SLP Re-Evaluation  03/18/18    SLP Start Time  0934    SLP Stop Time   1013    SLP Time Calculation (min)  39 min    Activity Tolerance  Patient tolerated treatment well       No past medical history on file.  Past Surgical History:  Procedure Laterality Date  . APPLICATION OF CRANIAL NAVIGATION N/A 01/04/2018   Procedure: APPLICATION OF CRANIAL NAVIGATION;  Surgeon: Erline Levine, MD;  Location: [facility];  Service: Neurosurgery;  Laterality: N/A;  . PR DURAL GRAFT REPAIR,SPINE DEFECT Left 01/04/2018   Procedure: Left Pterional craniotomy for biopsy with Brainlab;  Surgeon: Erline Levine, MD;  Location: [facility];  Service: Neurosurgery;  Laterality: Left;  Left Pterional craniotomy for biopsy with Brainlab    There were no vitals filed for this visit.  Subjective Assessment - 03/14/18 0937    Subjective  "It's got a hot air... um, um, " pt could not think of sauna/steam room    Currently in Pain?  No/denies            ADULT SLP TREATMENT - 03/14/18 0934      General Information   Behavior/Cognition  Alert;Cooperative;Pleasant mood;Impulsive;Distractible    Patient Positioning  Upright in chair      Treatment Provided   Treatment provided  Cognitive-Linquistic      Pain Assessment   Pain Assessment  No/denies pain      Cognitive-Linquistic Treatment   Treatment focused on  Aphasia    Skilled Treatment  Pt with several wordfinding lapses in simple conversation in first 15 minutes of session. Pt denies these have increased, however she reports  they happen frequently at home. She reports frustration and states this is the deficit she wishes to focus on in therapy. SLP worked with pt in specific mod complex naming tasks; 100% accuracy with mildly extended time. SLP educated re: compensations for anomia; in subsequent conversation pt required cues to "describe it" vs moving on in the conversation without repairing breakdown. Pt's reponses frequently tangential and off-topic. Pt brought up MD visit she has later today and mentioned a question she needed to ask her MD. Cues required to record a note on her phone so that she could remember to ask the question during her visit.      Assessment / Recommendations / Plan   Plan  Goals updated      Progression Toward Goals   Progression toward goals  Progressing toward goals       SLP Education - 03/14/18 1250    Education provided  Yes    Education Details  compensations for anomia    Methods  Explanation    Comprehension  Verbalized understanding       SLP Short Term Goals - 03/14/18 1252      SLP SHORT TERM GOAL #1   Title  Pt will complete moderately complex naming tasks with occasional min A over 3 sessions.    Status  Deferred      SLP SHORT TERM GOAL #  2   Title  Pt will utilize compensations for aphasia in structured naming tasks 4/5 opportunites with occasional min A over 2 sessions    Status  Deferred      SLP SHORT TERM GOAL #3   Title  Pt will utiltize compensations for aphasia over 10 minute modconversation 3/5 opportunites with occasional min A oer 2 sessions    Status  Deferred      SLP SHORT TERM GOAL #4   Title  Written expression/reading comprehension assessment if indicated    Status  Achieved      SLP SHORT TERM GOAL #5   Title  pt will tell SLP/indicate 4 memory strategies she could use to improve memory over two sessions    Status  Not Met       SLP Long Term Goals - 03/14/18 1252      SLP LONG TERM GOAL #1   Title  Pt will complete complex naming  tasks with rare min A over 3 sessions    Time  8    Period  Weeks    Status  On-going      SLP LONG TERM GOAL #2   Title  Pt will utilize compensations for aphasia over 15 minute complex conversation with rare min A over 2 sessions.    Time  8      SLP LONG TERM GOAL #3   Title  pt will utilize at least one memory strategy in 5 therapy sessions    Time  4    Period  Weeks    Status  On-going      SLP LONG TERM GOAL #4   Title  pt will note her errors in detailed linguistic tasks, when cued to double check her responses, 100% of the time    Time  4    Period  Weeks    Status  Deferred       Plan - 03/14/18 1251    Clinical Impression Statement  Pt demo'd anomia for items and names today. Pt also demo'd decr'd memory skills - see "skilled interventions" for details. Pt expressed desire to focus on anomia in therapy goals; naming goal reinstated and will continue to focus on compensations in conversation. I recommend skilled ST to maximize compensations for mild verbal expression deficits and for improved cognitive linguistic skills to promote incr'd independence and QOL.     Speech Therapy Frequency  2x / week    Treatment/Interventions  Cognitive reorganization;Functional tasks;Compensatory strategies;SLP instruction and feedback;Patient/family education;Internal/external aids;Environmental controls;Cueing hierarchy    Potential to Achieve Goals  Good    Potential Considerations  Ability to learn/carryover information    Consulted and Agree with Plan of Care  Patient       Patient will benefit from skilled therapeutic intervention in order to improve the following deficits and impairments:   Aphasia  Cognitive communication deficit    Problem List Patient Active Problem List   Diagnosis Date Noted  . 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 Stroke ([place]) 01/21/2018  . Reactive  hypertension   . Hypoalbuminemia due to protein-calorie malnutrition ([place])   . Seizures ([place])   . Elevated BUN   . Brain mass 01/06/2018  . Aphasia   . Abnormal CT of the head   . CNS mass 01/01/2018  . Acute metabolic encephalopathy 28/41/3244  . Headache 12/31/2017  . Hypertension 12/31/2017  . Hyperglycemia 12/31/2017  .  Confusion 12/31/2017   Deneise Lever, [place], [place] 03/14/2018, 12:53 PM  [facility] [address] [place], [place], [address] Phone: [phone]   Fax:  [phone]   Name: Tara [name] MRN: [id number] Date of Birth: 08/24/1951

## 2018-03-14 NOTE — Progress Notes (Signed)
QUICK REFERENCE INFORMATION: The ABCs of Providing the Annual Wellness Visit  CMS.gov Medicare Learning Network  [employer] Annual Wellness Visit  Subjective:   Tara [name] is a 67 y.o. Female who presents for an Annual Wellness Visit.  Patient Active Problem List   Diagnosis Date Noted  . 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 Stroke ([place]) 01/21/2018  . Reactive hypertension   . Hypoalbuminemia due to protein-calorie malnutrition ([place])   . Seizures ([place])   . Elevated BUN   . Brain mass 01/06/2018  . Aphasia   . Abnormal CT of the head   . CNS mass 01/01/2018  . Acute metabolic encephalopathy 62/95/2841  . Headache 12/31/2017  . Hypertension 12/31/2017  . Hyperglycemia 12/31/2017  . Confusion 12/31/2017    No past medical history on file.   Past Surgical History:  Procedure Laterality Date  . APPLICATION OF CRANIAL NAVIGATION N/A 01/04/2018   Procedure: APPLICATION OF CRANIAL NAVIGATION;  Surgeon: Erline Levine, MD;  Location: [place];  Service: Neurosurgery;  Laterality: N/A;  . BRAIN SURGERY    . PR DURAL GRAFT REPAIR,SPINE DEFECT Left 01/04/2018   Procedure: Left Pterional craniotomy for biopsy with Brainlab;  Surgeon: Erline Levine, MD;  Location: [place];  Service: Neurosurgery;  Laterality: Left;  Left Pterional craniotomy for biopsy with Brainlab     Outpatient Medications Prior to Visit  Medication Sig Dispense Refill  . aspirin EC 81 MG tablet Take 1 tablet (81 mg total) by mouth daily.    Marland Kitchen levETIRAcetam (KEPPRA) 500 MG tablet Take 1 tablet (500 mg total) by mouth 2 (two) times daily. 60 tablet 0  . lisinopril (PRINIVIL,ZESTRIL) 2.5 MG tablet Take 1 tablet (2.5 mg total) by mouth at bedtime. 30 tablet 3  . Multiple Vitamins-Minerals (PRESERVISION AREDS PO) Take 2 capsules by mouth daily.    Derrill Memo ON 03/20/2018] dexamethasone (DECADRON) 1 MG tablet Take 1 tablet (1 mg  total) by mouth daily for 15 days. 1mg  daily for 10 days, then 1mg  QOD for 10 days (Patient not taking: Reported on 03/14/2018) 15 tablet 0  . dexamethasone (DECADRON) 2 MG tablet Take 1 tablet (2 mg total) by mouth daily for 10 days. (Patient not taking: Reported on 03/14/2018) 10 tablet 0  . Multiple Vitamin (MULTIVITAMIN WITH MINERALS) TABS tablet Take 1 tablet by mouth daily.     No facility-administered medications prior to visit.     Allergies  Allergen Reactions  . Ether      Family History  Problem Relation Age of Onset  . Colon cancer Mother   . CAD Father   . Cancer Father   . Diabetes Father   . Heart disease Father   . Hypertension Father   . Diabetes Sister   . Diabetes Brother      Social History   Socioeconomic History  . Marital status: Married    Spouse name: Not on file  . Number of children: Not on file  . Years of education: Not on file  . Highest education level: Not on file  Occupational History  . Not on file  Social Needs  . Financial resource strain: Not on file  . Food insecurity:    Worry: Not on file    Inability: Not on file  . Transportation needs:    Medical: Not on file    Non-medical: Not on file  Tobacco Use  . Smoking status: Former Smoker  Packs/day: 1.00    Types: Cigarettes  . Smokeless tobacco: Never Used  . Tobacco comment: plans to quit now  Substance and Sexual Activity  . Alcohol use: No    Frequency: Never  . Drug use: No  . Sexual activity: Yes  Lifestyle  . Physical activity:    Days per week: Not on file    Minutes per session: Not on file  . Stress: Not on file  Relationships  . Social connections:    Talks on phone: Not on file    Gets together: Not on file    Attends religious service: Not on file    Active member of club or organization: Not on file    Attends meetings of clubs or organizations: Not on file    Relationship status: Not on file  Other Topics Concern  . Not on file  Social History  Narrative  . Not on file      Recent Hospitalizations? yes  Health Habits: Current exercise activities include: none Exercise: 0 times/week. Diet: in general, a "healthy" diet    Alcohol intake: none  Health Risk Assessment: The patient has completed a Health Risk Assessment. This has been reveiwed with them and has been scanned into the [facility] system as an attached document.  Current Medical Providers and Suppliers: [facility] Patient Care Team: Forrest Moron, MD as PCP - General (Internal Medicine) Future Appointments  Date Time Provider [place]  03/22/2018  3:30 PM Olegario Shearer [facility]  03/23/2018  9:30 AM Rine, Selmer Dominion, OT [facility]  03/23/2018 10:15 AM Sharen Counter, CCC-SLP [facility]  05/10/2018 11:40 AM Mickeal Skinner, Acey Lav, MD [facility] None     Age-appropriate Screening Schedule: The list below includes current immunization status and future screening recommendations based on patient's age. Orders for these recommended tests are listed in the plan section. The patient has been provided with a written plan. Immunization History  Administered Date(s) Administered  . Influenza, High Dose Seasonal PF 01/04/2018  . Pneumococcal Polysaccharide-23 01/04/2018  . Tdap 03/14/2018    Health Maintenance reviewed -  mammogram ordered, patient to schedule appointment, (advised of need to have breast exam in addition to mammogram), glycohemoglobin ordered    Depression Screen-PHQ2/9 completed today  Depression screen [facility] 2/9 03/14/2018 03/14/2018 02/03/2018  Decreased Interest 0 0 0  Down, Depressed, Hopeless 0 0 0  PHQ - 2 Score 0 0 0       Depression Severity and Treatment Recommendations:  0-4= None  5-9= Mild / Treatment: Support, educate to call if worse; return in one month  10-14= Moderate / Treatment: Support, watchful waiting; Antidepressant or Psycotherapy  15-19= Moderately severe / Treatment: Antidepressant OR Psychotherapy  >=  20 = Major depression, severe / Antidepressant AND Psychotherapy  Functional Status Survey:    Hearing Evaluation: 1. Do you have trouble hearing the television when others do not? no 2. Do you have to strain to hear/understand conversations? no   Advanced Care Planning: 1. Patient has executed an Advance Directive: yes  2. If no, patient was given the opportunity to execute an Advance Directive today? no  3. Are the patient's advanced directives in [place]? no 4. This patient has the ability to prepare an Advance Directive: yes 5. Provider is willing to follow the patient's wishes: yes  Cognitive Assessment: Does the patient have evidence of cognitive impairment? no The patient does not have any evidence of any cognitive problems and denies any  change  in mood/affect, appearance, speech, memory or motor skills.  Identification of Risk Factors: Risk factors include: hypertension  Review of Systems  Constitutional: Negative for chills and fever.  HENT: Negative for congestion, nosebleeds and sinus pain.   Eyes: Negative for blurred vision and double vision.  Respiratory: Negative for cough and wheezing.   Cardiovascular: Negative for chest pain and palpitations.  Gastrointestinal: Negative for abdominal pain, diarrhea, nausea and vomiting.  Genitourinary: Negative for dysuria, frequency and urgency.  Skin: Negative for itching and rash.  Neurological: Negative for seizures and weakness.  Psychiatric/Behavioral: Negative for depression. The patient is not nervous/anxious.     Objective:   Vitals:   03/14/18 1349  BP: (!) 155/98  Pulse: 92  Resp: 16  Temp: (!) 97.3 F (36.3 C)  TempSrc: Oral  SpO2: 95%  Weight: 227 lb 3.2 oz (103.1 kg)  Height: 5\' 6"  (1.676 m)    Body mass index is 36.67 kg/m.  BP (!) 155/98 (BP Location: Right Arm, Patient Position: Sitting, Cuff Size: Large)   Pulse 92   Temp (!) 97.3 F (36.3 C) (Oral)   Resp 16   Ht 5\' 6"  (1.676 m)   Wt  227 lb 3.2 oz (103.1 kg)   SpO2 95%   BMI 36.67 kg/m   General Appearance:    Alert, cooperative, no distress, appears stated age  Head:    Normocephalic, without obvious abnormality, atraumatic  Eyes:    PERRL, conjunctiva/corneas clear, EOM's intact, both eyes  Ears:    Normal TM's and external ear canals, both ears  Nose:   Nares normal, septum midline, mucosa normal, no drainage    or sinus tenderness  Throat:   Lips, mucosa, and tongue normal; teeth and gums normal  Neck:   Supple, symmetrical, trachea midline, no adenopathy;    thyroid:  no enlargement/tenderness/nodules; no carotid   bruit or JVD  Back:     Symmetric, no curvature, ROM normal, no CVA tenderness  Lungs:     Clear to auscultation bilaterally, respirations unlabored  Chest Wall:    No tenderness or deformity   Heart:    Regular rate and rhythm, S1 and S2 normal, no murmur, rub   or gallop  Breast Exam:    No tenderness, masses, or nipple abnormality, without lymphedematous skin changes  Abdomen:     Soft, non-tender, bowel sounds active all four quadrants,    no masses, no organomegaly  Genitalia:    Normal female without lesion, discharge or tenderness Pap smear performed, vaginal atrophy noted, normal urethral meatus, normal uterine fundus, no ovarian masses   Extremities:   Extremities normal, atraumatic, no cyanosis or edema  Pulses:   2+ and symmetric all extremities  Skin:   Skin color, texture, turgor normal, no rashes or lesions  Lymph nodes:   Cervical, supraclavicular, and axillary nodes normal  Neurologic:   CNII-XII intact, normal strength, sensation and reflexes    throughout     Assessment/Plan:   Patient Self-Management and Personalized Health Advice The patient has been provided with information about: {reduce salt in diet and cooking, reduce exposure to stress, continue current medications, continue current healthy lifestyle patterns and return for routine annual checkups  During the course  of the visit the patient was educated and counseled about appropriate screening and preventive services including:   lab testing as noted in orders section   Body mass index is 36.67 kg/m. Discussed the patient's BMI with her. The BMI BMI is not in  the acceptable range; BMI management plan is completed  Alexzandra was seen today for annual exam.  Diagnoses and all orders for this visit:  Encounter for Medicare annual wellness exam- discussed age appropriate health maintenance  Screening for breast cancer- discussed mammogram for breast cancer screening -     MM Digital Screening; Future  Encounter for screening for cervical cancer -  Discussed pap smear guidelines  -     Pap IG, CT/NG NAA, and HPV (high risk) [facility]  Menopause Current use of steroid medication - will screen for bone density -  Reviewed risk factors for osteoporosis (Caucasian, high dose steroid, estrogen deficiency) -     DG Bone Density; Future   Need for hepatitis C screening test -     HCV Ab w/Rflx to Verification  Need for Tdap vaccination -     Tdap vaccine greater than or equal to 7yo IM  Women's Health Maintenance Plan: Advised monthly breast exam and annual mammogram Advised dental exam every six months Discussed stress management Discussed pap smear screening guidelines      No follow-ups on file.  Future Appointments  Date Time Provider [place]  03/22/2018  3:30 PM Olegario Shearer [facility]  03/23/2018  9:30 AM Rine, Selmer Dominion, OT [facility]  03/23/2018 10:15 AM Sharen Counter, CCC-SLP [facility]  05/10/2018 11:40 AM Mickeal Skinner, Acey Lav, MD [facility] None    There are no Patient Instructions on file for this visit.  An after visit summary with all of these plans was given to the patient.

## 2018-03-14 NOTE — Telephone Encounter (Signed)
Spoke with  pt-  Explained we need to scheduled an OV due to her recent med issues ( seizures, brain mass with bx, stroke) per Dr Silverio Decamp-  Pt states she isnt sure why we need to see her as they are unsure why these things have occured- explained to her with recent health issues and the deep sedation we use, DR feels like she needs to assess her and discuss these issues before we sedate her and do this prep/ colon. She will also discuss all options for the pt at this visit- pt states she was not aware we used deep sedation - she is not at home with her calendar so she will have to call us back to schedule an OV- I informed her I cancelled her PV 5-28 and her colon 6-11 until after she sees Dr Silverio Decamp-  Gave her the main number to call and make that [place]

## 2018-03-14 NOTE — Therapy (Signed)
Tara [name] [address] [place], [place], [address] Phone: [phone]   Fax:  [phone]  Physical Therapy Treatment  Patient Details  Name: Tara [name] MRN: [id number] Date of Birth: 1951/10/13 Referring Provider: Alysia Penna   Encounter Date: 03/14/2018  PT End of Session - 03/14/18 1117    Visit Number  11 session cxl'd due to medical issues, no number change on 5/13    Number of Visits  19 5/8 added 6 visits; hope to only use 4    Date for PT Re-Evaluation  05/08/18    Authorization Type  MCR    Authorization Time Period  01/19/18 to 03/20/18;  03/09/18 to 05/08/18    PT Start Time  1105    PT Stop Time  1117 no charge on 03/14/18    PT Time Calculation (min)  12 min    Activity Tolerance  Treatment limited secondary to medical complications (Comment)       History reviewed. No pertinent past medical history.  Past Surgical History:  Procedure Laterality Date  . APPLICATION OF CRANIAL NAVIGATION N/A 01/04/2018   Procedure: APPLICATION OF CRANIAL NAVIGATION;  Surgeon: Erline Levine, MD;  Location: [place];  Service: Neurosurgery;  Laterality: N/A;  . PR DURAL GRAFT REPAIR,SPINE DEFECT Left 01/04/2018   Procedure: Left Pterional craniotomy for biopsy with Brainlab;  Surgeon: Erline Levine, MD;  Location: [facility];  Service: Neurosurgery;  Laterality: Left;  Left Pterional craniotomy for biopsy with Brainlab    Vitals:   03/14/18 1104 03/14/18 1109  BP: (!) 163/102 (!) 153/106  Pulse: 75 76    Subjective Assessment - 03/14/18 1114    Subjective  No new complaints. No falls.    Patient is accompained by:  Family member    Pertinent History  seixure d/o, HTN    Patient Stated Goals  Be able to help my daughter with her baby ( due in one month, [place], [place])    Currently in Pain?  No/denies    Pain Score  0-No pain             PT Short Term Goals - 01/31/18 1939      PT SHORT TERM GOAL #1    Title  Complete 6 MWT. Update LTG if appropriate. (Target 02/04/18)    Baseline  01/31/18 met    Status  Achieved        PT Long Term Goals - 03/09/18 1911      PT LONG TERM GOAL #1   Title  Patient will be independent with HEP and able to verbalize a plan for continued community-based activity upon d/c from PT (Target for all LTGs 03/11/18--due to delayed start by 1.5 wks based on census) (Target for ongoing goals 04/01/18)    Baseline  03/10/18 Patient wants to bring in pictures of the equipment available at the gym she will be using on discharge from PT and be educated on appropriate equipment for her to use.     Time  3    Period  Weeks    Status  On-going    Target Date  04/01/18      PT LONG TERM GOAL #2   Title  Patient will score >=22/30 on FGA to demonstrate improved balance and lesser fall risk.     Baseline  17/30; 03/07/18 23/30    Status  Achieved      PT LONG TERM GOAL #3   Title  Patient will improve 6  MWT to 1250 ft, progressing to an appropriate distance for her age to demonstrate improved gait, balance, and endurance.     Baseline  01/31/18 1050 ft (age based norm 1765 ft)   03/09/18 1037 ft    Time  3    Period  Weeks    Status  On-going      PT LONG TERM GOAL #4   Title  Patient will ambulate modified independently 200 ft over unlevel outdoor terrain while engaged in cognitive challenge/dual task activity.      Baseline  5/6 see notes; met    Status  Achieved      PT LONG TERM GOAL #5   Title  Patient will score >=25/30 on FGA to demonstrate improved balance and lesser fall risk.     Baseline  03/07/18  23/30    Time  3    Period  Weeks    Status  New            Plan - 03/14/18 1122    Clinical Impression Statement  Today's session was cancelled due to elevated BP readings. BP was elevated with OT session prior to PT and continued to be elevated after resting at table with no activity for 5 minutes. Pt see's her primary MD today and has a note from OT regarding  elevated BP's.     Rehab Potential  Good    Clinical Impairments Affecting Rehab Potential  ? degree of cognitive deficits (she downplays and ?expressive difficulties make it difficult to discern cognition vs language deficit)    PT Frequency  2x / week    PT Duration  6 weeks    PT Treatment/Interventions  ADLs/Self Care Home Management;Gait training;Cognitive remediation;Neuromuscular re-education;Balance training;Therapeutic exercise;Therapeutic activities;Functional mobility training;Stair training;Patient/family education;Passive range of motion    PT Next Visit Plan  address endurance, balance and community fitness plans post rehab    Consulted and Agree with Plan of Care  Patient       Patient will benefit from skilled therapeutic intervention in order to improve the following deficits and impairments:  Decreased balance, Decreased cognition, Decreased strength, Decreased safety awareness, Difficulty walking, Postural dysfunction  Visit Diagnosis: Unsteadiness on feet  Muscle weakness (generalized)     Problem List Patient Active Problem List   Diagnosis Date Noted  . 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 Stroke ([place]) 01/21/2018  . Reactive hypertension   . Hypoalbuminemia due to protein-calorie malnutrition ([place])   . Seizures ([place])   . Elevated BUN   . Brain mass 01/06/2018  . Aphasia   . Abnormal CT of the [name]   . CNS mass 01/01/2018  . Acute metabolic encephalopathy 62/95/2841  . Headache 12/31/2017  . Hypertension 12/31/2017  . Hyperglycemia 12/31/2017  . Confusion 12/31/2017    Tara [name], PTA, [place] [address], [place],  [address] [phone] 03/14/18, 11:25 AM   Name: Tara [name] MRN: [id number] Date of Birth: 07-29-1951

## 2018-03-15 LAB — HCV AB W/RFLX TO VERIFICATION: HCV Ab: <0.1 s/co ratio (ref 0.0–0.9)

## 2018-03-15 LAB — HCV INTERPRETATION

## 2018-03-16 ENCOUNTER — Ambulatory Visit: Payer: Medicare Other

## 2018-03-16 ENCOUNTER — Encounter: Payer: Self-pay | Admitting: Physical Therapy

## 2018-03-16 ENCOUNTER — Ambulatory Visit: Payer: Medicare Other | Admitting: Physical Therapy

## 2018-03-16 ENCOUNTER — Ambulatory Visit: Payer: Medicare Other | Admitting: Occupational Therapy

## 2018-03-16 VITALS — BP 151/111 | HR 84

## 2018-03-16 DIAGNOSIS — R278 Other lack of coordination: Secondary | ICD-10-CM

## 2018-03-16 DIAGNOSIS — R2681 Unsteadiness on feet: Secondary | ICD-10-CM

## 2018-03-16 DIAGNOSIS — M6281 Muscle weakness (generalized): Secondary | ICD-10-CM | POA: Diagnosis not present

## 2018-03-16 DIAGNOSIS — R41841 Cognitive communication deficit: Secondary | ICD-10-CM

## 2018-03-16 DIAGNOSIS — R4701 Aphasia: Secondary | ICD-10-CM | POA: Diagnosis not present

## 2018-03-16 DIAGNOSIS — R262 Difficulty in walking, not elsewhere classified: Secondary | ICD-10-CM | POA: Diagnosis not present

## 2018-03-16 NOTE — Therapy (Signed)
[facility] [address] [place], [place], [address] Phone: [phone]   Fax:  [phone]  Patient Details  Name: LIZANNE ERKER MRN: [id number] Date of Birth: 06-23-1951 Referring Provider:  Charlett Blake, MD  Encounter Date: 03/16/2018  Treatment withheld due to elevated BP. See PT note. RINE,KATHRYN 03/16/2018, 12:22 PM  [facility] [address] [place], [place], [address] Phone: [phone]   Fax:  [phone]

## 2018-03-16 NOTE — Therapy (Signed)
[facility] [address] [place], [place], [address] Phone: [phone]   Fax:  [phone]  Speech Language Pathology Treatment  Patient Details  Name: Tara [name] MRN: [id number] Date of Birth: 1951-03-24 Referring Provider: Dr. Alysia Penna   Encounter Date: 03/16/2018  End of Session - 03/16/18 1651    Visit Number  10    Number of Visits  17    Date for SLP Re-Evaluation  03/18/18    SLP Start Time  0933    SLP Stop Time   8757    SLP Time Calculation (min)  42 min    Activity Tolerance  Patient tolerated treatment well       History reviewed. No pertinent past medical history.  Past Surgical History:  Procedure Laterality Date  . APPLICATION OF CRANIAL NAVIGATION N/A 01/04/2018   Procedure: APPLICATION OF CRANIAL NAVIGATION;  Surgeon: Erline Levine, MD;  Location: [facility];  Service: Neurosurgery;  Laterality: N/A;  . BRAIN SURGERY    . PR DURAL GRAFT REPAIR,SPINE DEFECT Left 01/04/2018   Procedure: Left Pterional craniotomy for biopsy with Brainlab;  Surgeon: Erline Levine, MD;  Location: [facility];  Service: Neurosurgery;  Laterality: Left;  Left Pterional craniotomy for biopsy with Brainlab    There were no vitals filed for this visit.  Subjective Assessment - 03/16/18 0938    Subjective  "My blood pressure was high but my doctor didn't change my medicine."     Currently in Pain?  No/denies            ADULT SLP TREATMENT - 03/16/18 0939      General Information   Behavior/Cognition  Alert;Cooperative;Pleasant mood;Impulsive;Distractible      Treatment Provided   Treatment provided  Cognitive-Linquistic      Cognitive-Linquistic Treatment   Treatment focused on  Aphasia    Skilled Treatment  "My doctor said to me that in six months I'll have what I'll have - I may always have trouble finding words and I guess I'll just have to adapt. I'm fine with that." SLP addressed option to continue ST  as pt's last day is Wed 03-23-18. Pt is not convinced she wants to continue. SLP told pt to talk over with her family. SLP worte pt a note and gave it to her - she put it on the front of her notebook. "I think my ADD is making it harder for me to do some things with y'all." Divergent naming (abstract) completed with extra time and rare min A for naming. SLP req'd to cue pt to use compensations. In generating opposites with mod difficult words pt req'd occasional min A.        Assessment / Recommendations / Plan   Plan  Continue with current plan of care      Progression Toward Goals   Progression toward goals  Progressing toward goals         SLP Short Term Goals - 03/16/18 1657      SLP SHORT TERM GOAL #1   Title  Pt will complete moderately complex naming tasks with occasional min A over 3 sessions.    Status  Deferred      SLP SHORT TERM GOAL #2   Title  Pt will utilize compensations for aphasia in structured naming tasks 4/5 opportunites with occasional min A over 2 sessions    Status  Deferred      SLP SHORT TERM GOAL #3   Title  Pt will  utiltize compensations for aphasia over 10 minute modconversation 3/5 opportunites with occasional min A oer 2 sessions    Status  Deferred      SLP SHORT TERM GOAL #4   Title  Written expression/reading comprehension assessment if indicated    Status  Achieved      SLP SHORT TERM GOAL #5   Title  pt will tell SLP/indicate 4 memory strategies she could use to improve memory over two sessions    Status  Not Met       SLP Long Term Goals - 03/16/18 0939      SLP LONG TERM GOAL #1   Title  Pt will complete complex naming tasks with rare min A over 3 sessions    Time  8    Period  Weeks    Status  On-going      SLP LONG TERM GOAL #2   Title  Pt will utilize compensations for aphasia over 15 minute complex conversation with rare min A over 2 sessions.    Time  8    Period  Weeks    Status  On-going      SLP LONG TERM GOAL #3   Title   pt will utilize at least one memory strategy in 5 therapy sessions    Time  4    Period  Weeks    Status  On-going      SLP LONG TERM GOAL #4   Title  pt will note her errors in detailed linguistic tasks, when cued to double check her responses, 100% of the time    Time  4    Period  Weeks    Status  Deferred       Plan - 03/16/18 1652    Clinical Impression Statement  Pt demo'd rare anomia for names today. Frequent off-topic/tangential comments and conversation starters presented to SLP. Pt is unsure if she wants to continue in ST - is d/c'ing OT next week. SLP told pt to talk with her son about this and that SLP recommended cont'd therapy for word finding, which pt has stated is most frustrating for her at this time. See "skilled interventions" for details. I recommend skilled ST to maximize compensations for mild verbal expression deficits and for improved cognitive linguistic skills to promote incr'd independence and QOL.     Speech Therapy Frequency  2x / week    Treatment/Interventions  Cognitive reorganization;Functional tasks;Compensatory strategies;SLP instruction and feedback;Patient/family education;Internal/external aids;Environmental controls;Cueing hierarchy    Potential to Achieve Goals  Good    Potential Considerations  Ability to learn/carryover information    Consulted and Agree with Plan of Care  Patient       Patient will benefit from skilled therapeutic intervention in order to improve the following deficits and impairments:   Aphasia  Cognitive communication deficit    Problem List Patient Active Problem List   Diagnosis Date Noted  . 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 Stroke ([facility]) 01/21/2018  . Reactive hypertension   . Hypoalbuminemia due to protein-calorie malnutrition ([facility])   . Seizures ([facility])   . Elevated BUN   . Brain mass 01/06/2018  . Aphasia   .  Abnormal CT of the head   . CNS mass 01/01/2018  . Acute metabolic encephalopathy 84/69/6295  . Headache 12/31/2017  . Hypertension 12/31/2017  . Hyperglycemia 12/31/2017  . Confusion 12/31/2017    Kalyse Meharg ,MS,  CCC-SLP  03/16/2018, 4:58 PM  [facility] [address] [place], [place], [address] Phone: [phone]   Fax:  [phone]   Name: Tara [name] MRN: [id number] Date of Birth: 1951-07-25

## 2018-03-16 NOTE — Patient Instructions (Signed)
  Please complete the assigned speech therapy homework prior to your next session and return it to the speech therapist at your next visit. Talk with your son about whether or not you are going to continue past next week.

## 2018-03-17 LAB — PAP IG, CT-NG NAA, HPV HIGH-RISK
Chlamydia, Nuc. Acid Amp: NEGATIVE
Gonococcus by Nucleic Acid Amp: NEGATIVE
HPV, high-risk: NEGATIVE
PAP Smear Comment: 0

## 2018-03-17 NOTE — Therapy (Signed)
[facility] [address] [place], [place], [address] Phone: [phone]   Fax:  [phone]  Physical Therapy Treatment  Patient Details  Name: Tara [name] MRN: [id number] Date of Birth: 11-18-1950 Referring Provider: Alysia Penna   Encounter Date: 03/16/2018  PT End of Session - 03/16/18 1400    Visit Number  11 session cxl'd due to medical issues, no number change on 5/13    Number of Visits  19 5/8 added 6 visits; hope to only use 4    Date for PT Re-Evaluation  05/08/18    Authorization Type  MCR    Authorization Time Period  01/19/18 to 03/20/18;  03/09/18 to 05/08/18    PT Start Time  1018    PT Stop Time  1039 no charge, BP too high and did not come down    PT Time Calculation (min)  21 min    Activity Tolerance  Treatment limited secondary to medical complications (Comment)       History reviewed. No pertinent past medical history.  Past Surgical History:  Procedure Laterality Date  . APPLICATION OF CRANIAL NAVIGATION N/A 01/04/2018   Procedure: APPLICATION OF CRANIAL NAVIGATION;  Surgeon: Erline Levine, MD;  Location: [facility];  Service: Neurosurgery;  Laterality: N/A;  . BRAIN SURGERY    . PR DURAL GRAFT REPAIR,SPINE DEFECT Left 01/04/2018   Procedure: Left Pterional craniotomy for biopsy with Brainlab;  Surgeon: Erline Levine, MD;  Location: [facility];  Service: Neurosurgery;  Laterality: Left;  Left Pterional craniotomy for biopsy with Brainlab    Vitals:   03/16/18 1017 03/16/18 1030 03/16/18 1034  BP: (!) 156/103 (!) 156/108 (!) 151/111  Pulse: 77  84        PT Short Term Goals - 01/31/18 1939      PT SHORT TERM GOAL #1   Title  Complete 6 MWT. Update LTG if appropriate. (Target 02/04/18)    Baseline  01/31/18 met    Status  Achieved        PT Long Term Goals - 03/09/18 1911      PT LONG TERM GOAL #1   Title  Patient will be independent with HEP and able to verbalize a plan for continued  community-based activity upon d/c from PT (Target for all LTGs 03/11/18--due to delayed start by 1.5 wks based on census) (Target for ongoing goals 04/01/18)    Baseline  03/10/18 Patient wants to bring in pictures of the equipment available at the gym she will be using on discharge from PT and be educated on appropriate equipment for her to use.     Time  3    Period  Weeks    Status  On-going    Target Date  04/01/18      PT LONG TERM GOAL #2   Title  Patient will score >=22/30 on FGA to demonstrate improved balance and lesser fall risk.     Baseline  17/30; 03/07/18 23/30    Status  Achieved      PT LONG TERM GOAL #3   Title  Patient will improve 6 MWT to 1250 ft, progressing to an appropriate distance for her age to demonstrate improved gait, balance, and endurance.     Baseline  01/31/18 1050 ft (age based norm 1765 ft)   03/09/18 1037 ft    Time  3    Period  Weeks    Status  On-going      PT LONG TERM  GOAL #4   Title  Patient will ambulate modified independently 200 ft over unlevel outdoor terrain while engaged in cognitive challenge/dual task activity.      Baseline  5/6 see notes; met    Status  Achieved      PT LONG TERM GOAL #5   Title  Patient will score >=25/30 on FGA to demonstrate improved balance and lesser fall risk.     Baseline  03/07/18  23/30    Time  3    Period  Weeks    Status  New            Plan - 03/16/18 1400    Clinical Impression Statement  Held today's session due to continued elevated BP readings. Pt planning to call neurologist as her primary MD did not want to change any of her BP meds. Should benefit from continued PT to progress toward goals of recert.     Rehab Potential  Good    Clinical Impairments Affecting Rehab Potential  ? degree of cognitive deficits (she downplays and ?expressive difficulties make it difficult to discern cognition vs language deficit)    PT Frequency  2x / week    PT Duration  6 weeks    PT Treatment/Interventions   ADLs/Self Care Home Management;Gait training;Cognitive remediation;Neuromuscular re-education;Balance training;Therapeutic exercise;Therapeutic activities;Functional mobility training;Stair training;Patient/family education;Passive range of motion    PT Next Visit Plan  address endurance, balance and community fitness plans post rehab    Consulted and Agree with Plan of Care  Patient       Patient will benefit from skilled therapeutic intervention in order to improve the following deficits and impairments:  Decreased balance, Decreased cognition, Decreased strength, Decreased safety awareness, Difficulty walking, Postural dysfunction  Visit Diagnosis: Unsteadiness on feet     Problem List Patient Active Problem List   Diagnosis Date Noted  . 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 Stroke ([facility]) 01/21/2018  . Reactive hypertension   . Hypoalbuminemia due to protein-calorie malnutrition ([facility])   . Seizures ([facility])   . Elevated BUN   . Brain mass 01/06/2018  . Aphasia   . Abnormal CT of the head   . CNS mass 01/01/2018  . Acute metabolic encephalopathy 08/67/6195  . Headache 12/31/2017  . Hypertension 12/31/2017  . Hyperglycemia 12/31/2017  . Confusion 12/31/2017    Willow Ora, PTA, [facility] [address] [place], [place] [address] [phone] 03/17/18, 8:32 AM  Name: Tara [name] MRN: [id number] Date of Birth: 19-May-1951

## 2018-03-21 ENCOUNTER — Ambulatory Visit: Payer: Medicare Other | Admitting: Physical Therapy

## 2018-03-21 ENCOUNTER — Encounter: Payer: Medicare Other | Admitting: Speech Pathology

## 2018-03-21 ENCOUNTER — Encounter: Payer: Medicare Other | Admitting: Occupational Therapy

## 2018-03-22 ENCOUNTER — Ambulatory Visit: Payer: Medicare Other | Admitting: Physical Therapy

## 2018-03-23 ENCOUNTER — Ambulatory Visit: Payer: Medicare Other

## 2018-03-23 ENCOUNTER — Ambulatory Visit: Payer: Medicare Other | Admitting: Occupational Therapy

## 2018-03-23 VITALS — BP 160/104

## 2018-03-23 DIAGNOSIS — R41844 Frontal lobe and executive function deficit: Secondary | ICD-10-CM

## 2018-03-23 DIAGNOSIS — R2681 Unsteadiness on feet: Secondary | ICD-10-CM | POA: Diagnosis not present

## 2018-03-23 DIAGNOSIS — M6281 Muscle weakness (generalized): Secondary | ICD-10-CM

## 2018-03-23 DIAGNOSIS — R41841 Cognitive communication deficit: Secondary | ICD-10-CM

## 2018-03-23 DIAGNOSIS — R262 Difficulty in walking, not elsewhere classified: Secondary | ICD-10-CM | POA: Diagnosis not present

## 2018-03-23 DIAGNOSIS — R41840 Attention and concentration deficit: Secondary | ICD-10-CM

## 2018-03-23 DIAGNOSIS — R278 Other lack of coordination: Secondary | ICD-10-CM | POA: Diagnosis not present

## 2018-03-23 DIAGNOSIS — R4701 Aphasia: Secondary | ICD-10-CM | POA: Diagnosis not present

## 2018-03-23 NOTE — Therapy (Signed)
[facility] [address] [place], [place], [address] Phone: [phone]   Fax:  [phone]  Patient Details  Name: Tara [name] MRN: [id number] Date of Birth: 01-25-1951 Referring Provider:  Charlett Blake, MD  Encounter Date: 03/23/2018 OT Short Term Goals - 03/14/18 1038      OT SHORT TERM GOAL #1   Title  I with HEP    Status  Achieved      OT SHORT TERM GOAL #2   Title  Pt will perform basic home management/ cooking with distant supervision demonstrating good safety awareness.    Status  Achieved      OT SHORT TERM GOAL #3   Title  Pt will demonstrate improved fine motor coordination for ADLs as evidenced by decreasing 9 hole peg test score by 3 secs for RUE.    Status  Achieved 23.25 sec      OT SHORT TERM GOAL #4   Title  Pt will perform a basic functional organization task with no more than 2 v.c    Status  Achieved      OT SHORT TERM GOAL #5   Title  Pt will demonstrate ability to alternate attention between 2 functional tasks with 80% or better accuracy.    Status  Achieved      OT Long Term Goals - 03/23/18 0940      OT LONG TERM GOAL #1   Title  I with updated HEP.    Status  Achieved      OT LONG TERM GOAL #2   Title  Pt will perform mod complex home management and cooking at a modified independent level demonstrating good safety awareness.    Status  Achieved      OT LONG TERM GOAL #3   Title   Pt will perform a moderate complex organization task independently Upgraded -Pt will perform a mod complex organization task  including specific details  in a busy environment  - met  On 1 trial, did not reassess.   Status  Unable to assess orginal goal was met, updated goal was not assessed      OT LONG TERM GOAL #4   Title  Pt will demonstrate ability to divide attention between a physical and cognitive task with 90% or better accuracy. 86-90%   Status  Partially Met inconsistent, unable to  assess on final visit due to elevated BP      P arrived for therapy today. Her BP was elevated on arrival 157/112, then after resting for 10-15 mins BP remained elevated 160/104. Therapist discussed with pt plans for d/c , pt is in agreement. Discussion with pt that multi tasking continues to remain challenging at times and she will need to exercise caution when her MD clears her to go back to driving in 6 mons. Pt verbalized understanding.  OCCUPATIONAL THERAPY DISCHARGE SUMMARY  Visits from Start of Care: 12 Current functional level related to goals / functional outcomes: Pt demonstrates overall progress towards goals. She has resumed performance of home management and cooking at home without difficulty. Pt has been cautioned to avoid exercise when her BP is elevated . Goals were not fully assessed as pt's BP has been elevated for several visits.   Remaining deficits: Continued difficulty with multi tasking/ divided attention   Education / Equipment: Pt was educated regarding HEP and remaining deficits. Therapist has encouraged pt to work with her MD regarding BP management. Pt verbalizes understanding of all  education.  Plan: Patient agrees to discharge.  Patient goals were partially met. Patient is being discharged due to being pleased with the current functional level.  ?????       RINE,KATHRYN 03/23/2018, 10:05 AM  [facility] [address] [name], [place], [address] Phone: [phone]   Fax:  [phone]

## 2018-03-23 NOTE — Therapy (Signed)
[facility] [address] [place], [place], [address] Phone: [phone]   Fax:  [phone]  Speech Language Pathology Treatment/renewal-discharge  Patient Details  Name: Tara [name] MRN: [id number] Date of Birth: January 14, 1951 Referring Provider: Dr. Alysia Penna   Encounter Date: 03/23/2018  End of Session - 03/23/18 1105    Visit Number  11    Number of Visits  17    Date for SLP Re-Evaluation  03/18/18    SLP Start Time  6712    SLP Stop Time   1043    SLP Time Calculation (min)  41 min    Activity Tolerance  Patient tolerated treatment well       History reviewed. No pertinent past medical history.  Past Surgical History:  Procedure Laterality Date  . APPLICATION OF CRANIAL NAVIGATION N/A 01/04/2018   Procedure: APPLICATION OF CRANIAL NAVIGATION;  Surgeon: Erline Levine, MD;  Location: [facility];  Service: Neurosurgery;  Laterality: N/A;  . BRAIN SURGERY    . PR DURAL GRAFT REPAIR,SPINE DEFECT Left 01/04/2018   Procedure: Left Pterional craniotomy for biopsy with Brainlab;  Surgeon: Erline Levine, MD;  Location: [facility];  Service: Neurosurgery;  Laterality: Left;  Left Pterional craniotomy for biopsy with Brainlab    There were no vitals filed for this visit.  Subjective Assessment - 03/23/18 1028    Subjective  "I think I will (take a break from therapies)." "My kids play games with me - like that one where you can't say the words and you try to guess a word. (Taboo)"    Patient is accompained by:  -- alone    Currently in Pain?  No/denies            ADULT SLP TREATMENT - 03/23/18 1030      General Information   Behavior/Cognition  Alert;Cooperative;Pleasant mood;Impulsive;Distractible      Treatment Provided   Treatment provided  Cognitive-Linquistic      Cognitive-Linquistic Treatment   Treatment focused on  Aphasia    Skilled Treatment  Pt told SLP she is taking a break from all therapies  for a while, so today will be d/c. In simple structured word-finding tasks pt req'd Mod-max cues occasionally. Anomic episode once during the session today and SLP assisted pt minimally in producing the target word via compensation. Tangential comments made about the target word approx 50% of the time. (e.g., "We went on a boat... when I was a little girl.", "I'd love to take my kids to see the pyramids.") SLP provided pt some word finding tasks for home, after d/c today.      Assessment / Recommendations / Plan   Plan  Discharge SLP treatment due to (comment) pt request      Progression Toward Goals   Progression toward goals  -- d/c day - see goal update       SLP Education - 03/23/18 1105    Education provided  Yes    Education Details  home tasks    Person(s) Educated  Patient    Methods  Explanation    Comprehension  Verbalized understanding       SLP Short Term Goals - 03/16/18 1657      SLP SHORT TERM GOAL #1   Title  Pt will complete moderately complex naming tasks with occasional min A over 3 sessions.    Status  Deferred      SLP SHORT TERM GOAL #2   Title  Pt will  utilize compensations for aphasia in structured naming tasks 4/5 opportunites with occasional min A over 2 sessions    Status  Deferred      SLP SHORT TERM GOAL #3   Title  Pt will utiltize compensations for aphasia over 10 minute modconversation 3/5 opportunites with occasional min A oer 2 sessions    Status  Deferred      SLP SHORT TERM GOAL #4   Title  Written expression/reading comprehension assessment if indicated    Status  Achieved      SLP SHORT TERM GOAL #5   Title  pt will tell SLP/indicate 4 memory strategies she could use to improve memory over two sessions    Status  Not Met       SLP Long Term Goals - 03/23/18 1117      SLP LONG TERM GOAL #1   Title  Pt will complete complex naming tasks with rare min A over 3 sessions    Status  Not Met      SLP LONG TERM GOAL #2   Title  Pt will  utilize compensations for aphasia over 15 minute complex conversation with rare min A over 2 sessions.    Status  Partially Met one session      SLP LONG TERM GOAL #3   Title  pt will utilize at least one memory strategy in 5 therapy sessions    Status  Partially Met      SLP LONG TERM GOAL #4   Title  pt will note her errors in detailed linguistic tasks, when cued to double check her responses, 100% of the time    Status  Deferred       Plan - 03/23/18 1105    Clinical Impression Statement  Pt demo'd  anomia once in conversation today. Frequent off-topic/tangential comments and conversation starters presented to SLP during structured tasks for word finding, in which pt req'd occasional mod-max A. Pt is chosen not to continue in ST, as PT and OT also d/c'd - she is d/c'ing ST after this session. Pt will be seen today addressing the current goals. See "skilled interventions" for details on today's session.     Speech Therapy Frequency  2x / week    Duration  -- 12 weeks or 25 total visits    Treatment/Interventions  Cognitive reorganization;Functional tasks;Compensatory strategies;SLP instruction and feedback;Patient/family education;Internal/external aids;Environmental controls;Cueing hierarchy    Potential to Achieve Goals  Good    Potential Considerations  Ability to learn/carryover information       Patient will benefit from skilled therapeutic intervention in order to improve the following deficits and impairments:   Aphasia  Cognitive communication deficit   SPEECH THERAPY RENEWAL/DISCHARGE SUMMARY  Visits from Start of Care: 11  Current functional level related to goals / functional outcomes: See goal update. Pt reported being bothered mostly by word finding/anomia episodes. In conversation pt with very few word finding difficulties during therapy. Pt also demonstrated cognitive linguistic deficits especially in attention and awareness.  Pt d/c'd OT and PT on 03-23-18 and she  requested d/c from [facility] as well. Pt's goals will be enforced today as her final session of ST.   Remaining deficits: Attention, awareness, mild anomia    Education / Equipment: Anomia and cognitive linguistic compensations    Plan: Patient agrees to discharge.  Patient goals were partially met. Patient is being discharged due to the patient's request.  ?????       Problem List Patient Active  Problem List   Diagnosis Date Noted  . 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 Stroke ([facility]) 01/21/2018  . Reactive hypertension   . Hypoalbuminemia due to protein-calorie malnutrition ([facility])   . Seizures ([facility])   . Elevated BUN   . Brain mass 01/06/2018  . Aphasia   . Abnormal CT of the head   . CNS mass 01/01/2018  . Acute metabolic encephalopathy 81/84/0375  . Headache 12/31/2017  . Hypertension 12/31/2017  . Hyperglycemia 12/31/2017  . Confusion 12/31/2017    [facility] ,MS, CCC-SLP  03/23/2018, 11:18 AM  [facility] [address] [place], [place], [address] Phone: [phone]   Fax:  [phone]   Name: AVAIYAH STRUBEL MRN: [id number] Date of Birth: 07-Mar-1951

## 2018-03-23 NOTE — Patient Instructions (Signed)
Do some word finding tasks if you like, three times a week. Playing word finding games is alright as well (Taboo, scattergories, crosswords)

## 2018-03-23 NOTE — Addendum Note (Signed)
Addended by: Garald Balding B on: 03/23/2018 11:25 AM   Modules accepted: Orders

## 2018-03-29 ENCOUNTER — Other Ambulatory Visit: Payer: Self-pay | Admitting: *Deleted

## 2018-03-29 MED ORDER — LEVETIRACETAM 500 MG PO TABS: 500.0000 mg | ORAL_TABLET | Freq: Two times a day (BID) | ORAL | 0 refills | Status: DC

## 2018-03-30 ENCOUNTER — Other Ambulatory Visit: Payer: Self-pay | Admitting: Internal Medicine

## 2018-03-30 MED ORDER — LISINOPRIL 10 MG PO TABS: 10.0000 mg | ORAL_TABLET | Freq: Every day | ORAL | 5 refills | Status: DC

## 2018-04-11 ENCOUNTER — Telehealth: Payer: Self-pay | Admitting: *Deleted

## 2018-04-11 NOTE — Telephone Encounter (Signed)
Son [name] called to report that patient did complete taper of decadron but during taper from 1 mg daily to every other day and continuing following being off patient is reporting left temporal headaches that are pulsating.  Patient has not attempted to use any tylenol to help resolve headaches at this point, was unsure if she could take it.  Per [facility] do not want to put back on Steroid if possible at this time to get a clean MRI without steroids and encouraged patient to try Tylenol for a week and if this does not help then he might consider prescribing something else instead of steroids.  Patient son understood.    He also requested handicap placard for mother.  Completed and placed at front desk for patient to pick up.

## 2018-04-12 ENCOUNTER — Encounter: Payer: Medicare Other | Admitting: Gastroenterology

## 2018-04-18 ENCOUNTER — Ambulatory Visit (HOSPITAL_COMMUNITY)
Admission: RE | Admit: 2018-04-18 | Discharge: 2018-04-18 | Disposition: A | Discharge status: Home / Self Care | Payer: Medicare Other | Source: Ambulatory Visit | Attending: Internal Medicine | Admitting: Internal Medicine

## 2018-04-18 ENCOUNTER — Telehealth: Payer: Self-pay

## 2018-04-18 ENCOUNTER — Other Ambulatory Visit: Payer: Self-pay | Admitting: Internal Medicine

## 2018-04-18 DIAGNOSIS — G968 Other specified disorders of central nervous system: Secondary | ICD-10-CM

## 2018-04-18 DIAGNOSIS — G9689 Other specified disorders of central nervous system: Secondary | ICD-10-CM

## 2018-04-18 DIAGNOSIS — G969 Disorder of central nervous system, unspecified: Secondary | ICD-10-CM

## 2018-04-18 DIAGNOSIS — R600 Localized edema: Secondary | ICD-10-CM

## 2018-04-18 NOTE — Progress Notes (Signed)
LE venous duplex prelim: negative for DVT. Landry Mellow, RDMS, RVT  Attempted to call results to Dr. Renda Rolls nurse. Left message on machine. Will let patient leave.

## 2018-04-18 NOTE — Telephone Encounter (Signed)
Received a call from patient son [name] stating that the patient has had bilateral lower leg edema for 2 days, painful, and is not relieved with elevation. Patient has slightly worsened SOB with exertion. Order placed for [place] of bilateral lower extremities by Dr. Mickeal Skinner. Spoke with Sonia Side in the Vascular lab and scheduled appointment at 4:00 pm today at [facility]. Contacted [facility] and communicated 4:00 appointment today and confirmed ability to attend the appointment at [facility].

## 2018-04-19 ENCOUNTER — Telehealth: Payer: Self-pay

## 2018-04-19 NOTE — Telephone Encounter (Signed)
Follow up call with patient son Tara [name] regarding [place] results neg for DVT and questions concerning current medications. Spoke with Tara [name] and explained that the current medications would not lead to the bilateral lower extremity swelling and that Dr. Mickeal Skinner stated that he can follow up or she can follow up with the PCP. Tara [name] stated that the preference would be to follow up with Dr. Mickeal Skinner and he will call back to schedule a follow up appointment. He will communicate to the patient to try elevating her legs more during the day and she has compression stockings that she is going to try to help with the edema as well.

## 2018-05-03 ENCOUNTER — Ambulatory Visit (HOSPITAL_COMMUNITY)
Admission: RE | Admit: 2018-05-03 | Discharge: 2018-05-03 | Disposition: A | Discharge status: Home / Self Care | Payer: Medicare Other | Source: Ambulatory Visit | Attending: Internal Medicine | Admitting: Internal Medicine

## 2018-05-03 ENCOUNTER — Encounter: Payer: Self-pay | Admitting: Family Medicine

## 2018-05-03 DIAGNOSIS — G930 Cerebral cysts: Secondary | ICD-10-CM | POA: Diagnosis not present

## 2018-05-03 DIAGNOSIS — G939 Disorder of brain, unspecified: Secondary | ICD-10-CM | POA: Diagnosis not present

## 2018-05-03 DIAGNOSIS — G9389 Other specified disorders of brain: Secondary | ICD-10-CM

## 2018-05-03 DIAGNOSIS — C8589 Other specified types of non-Hodgkin lymphoma, extranodal and solid organ sites: Secondary | ICD-10-CM | POA: Diagnosis not present

## 2018-05-03 DIAGNOSIS — I639 Cerebral infarction, unspecified: Secondary | ICD-10-CM | POA: Diagnosis not present

## 2018-05-03 LAB — POCT I-STAT CREATININE: Creatinine, Ser: 0.6 mg/dL (ref 0.44–1.00)

## 2018-05-03 MED ORDER — GADOBENATE DIMEGLUMINE 529 MG/ML IV SOLN
20.0000 mL | Freq: Once | INTRAVENOUS | Status: AC | PRN
  Administered 2018-05-03: 20 mL via INTRAVENOUS

## 2018-05-04 ENCOUNTER — Other Ambulatory Visit: Payer: Self-pay | Admitting: Internal Medicine

## 2018-05-04 DIAGNOSIS — G9689 Other specified disorders of central nervous system: Secondary | ICD-10-CM

## 2018-05-04 DIAGNOSIS — G968 Other specified disorders of central nervous system: Principal | ICD-10-CM

## 2018-05-10 ENCOUNTER — Encounter: Payer: Self-pay | Admitting: Internal Medicine

## 2018-05-10 ENCOUNTER — Telehealth: Payer: Self-pay

## 2018-05-10 ENCOUNTER — Inpatient Hospital Stay: Payer: Medicare Other | Attending: Internal Medicine | Admitting: Internal Medicine

## 2018-05-10 ENCOUNTER — Telehealth: Payer: Self-pay | Admitting: *Deleted

## 2018-05-10 VITALS — BP 125/106 | HR 92 | Temp 98.7°F | Resp 18 | Ht 66.0 in | Wt 230.6 lb

## 2018-05-10 DIAGNOSIS — R569 Unspecified convulsions: Secondary | ICD-10-CM | POA: Insufficient documentation

## 2018-05-10 DIAGNOSIS — G939 Disorder of brain, unspecified: Secondary | ICD-10-CM | POA: Diagnosis not present

## 2018-05-10 DIAGNOSIS — D432 Neoplasm of uncertain behavior of brain, unspecified: Secondary | ICD-10-CM | POA: Insufficient documentation

## 2018-05-10 DIAGNOSIS — G9389 Other specified disorders of brain: Secondary | ICD-10-CM

## 2018-05-10 NOTE — Telephone Encounter (Signed)
Per 7/9 no los 

## 2018-05-10 NOTE — Telephone Encounter (Signed)
Scheduled Slit Lamp Examination to rule out lymphoma  In the eye.  Patient has appt on 05/18/2018 @ 9:00 at [facility].  They will mail patient new patient paperwork to complete.  Patient notified.

## 2018-05-10 NOTE — Progress Notes (Signed)
[facility] at [place], [place] [address] [phone]   Interval Evaluation  Date of Service: 05/10/18 Patient Name: Tara [name] Patient MRN: [id number] Patient DOB: [birth year] Provider: [name], MD  Identifying Statement:  Tara [name] is a 67 y.o. female with left frontal brain lesion   Interval History:  Tara [name] presents today for follow up after recent MRI brain.  Still complains of occassional word finding difficulty and somewhat impaired memory. She otherwise describes no new or progressive neurologic symptoms.  She has occasional headaches which are mild/moderate.  Has been off decadron for ~1 month.          Medications: Current Outpatient Medications on File Prior to Visit  Medication Sig Dispense Refill  . aspirin EC 81 MG tablet Take 1 tablet (81 mg total) by mouth daily.    Marland Kitchen levETIRAcetam (KEPPRA) 500 MG tablet Take 1 tablet (500 mg total) by mouth 2 (two) times daily. 60 tablet 0  . lisinopril (PRINIVIL,ZESTRIL) 10 MG tablet Take 1 tablet (10 mg total) by mouth at bedtime. 30 tablet 5  . Multiple Vitamin (MULTIVITAMIN WITH MINERALS) TABS tablet Take 1 tablet by mouth daily.    . Multiple Vitamins-Minerals (PRESERVISION AREDS PO) Take 2 capsules by mouth daily.     No current facility-administered medications on file prior to visit.     Allergies:  Allergies  Allergen Reactions  . Ether    Past Medical History: History reviewed. No pertinent past medical history. Past Surgical History:  Past Surgical History:  Procedure Laterality Date  . APPLICATION OF CRANIAL NAVIGATION N/A 01/04/2018   Procedure: APPLICATION OF CRANIAL NAVIGATION;  Surgeon: Erline Levine, MD;  Location: [facility];  Service: Neurosurgery;  Laterality: N/A;  . BRAIN SURGERY    . PR DURAL GRAFT REPAIR,SPINE DEFECT Left 01/04/2018   Procedure: Left Pterional craniotomy for biopsy with Brainlab;  Surgeon: Erline Levine,  MD;  Location: [facility];  Service: Neurosurgery;  Laterality: Left;  Left Pterional craniotomy for biopsy with Brainlab   Social History:  Social History   Socioeconomic History  . Marital status: Married    Spouse name: Not on file  . Number of children: Not on file  . Years of education: Not on file  . Highest education level: Not on file  Occupational History  . Not on file  Social Needs  . Financial resource strain: Not on file  . Food insecurity:    Worry: Not on file    Inability: Not on file  . Transportation needs:    Medical: Not on file    Non-medical: Not on file  Tobacco Use  . Smoking status: Former Smoker    Packs/day: 1.00    Types: Cigarettes  . Smokeless tobacco: Never Used  . Tobacco comment: plans to quit now  Substance and Sexual Activity  . Alcohol use: No    Frequency: Never  . Drug use: No  . Sexual activity: Yes  Lifestyle  . Physical activity:    Days per week: Not on file    Minutes per session: Not on file  . Stress: Not on file  Relationships  . Social connections:    Talks on phone: Not on file    Gets together: Not on file    Attends religious service: Not on file    Active member of club or organization: Not on file    Attends meetings of clubs or organizations:  Not on file    Relationship status: Not on file  . Intimate partner violence:    Fear of current or ex partner: Not on file    Emotionally abused: Not on file    Physically abused: Not on file    Forced sexual activity: Not on file  Other Topics Concern  . Not on file  Social History Narrative  . Not on file   Family History:  Family History  Problem Relation Age of Onset  . Colon cancer Mother   . CAD Father   . Cancer Father   . Diabetes Father   . Heart disease Father   . Hypertension Father   . Diabetes Sister   . Diabetes Brother     Review of Systems: Constitutional: Denies fevers, chills or abnormal weight loss Eyes: Denies blurriness of vision Ears,  nose, mouth, throat, and face: +hair loss Respiratory: Denies cough, dyspnea or wheezes Cardiovascular: Denies palpitation, chest discomfort or lower extremity swelling Gastrointestinal:  Denies nausea, constipation, diarrhea GU: Denies dysuria or incontinence Skin: Denies abnormal skin rashes Neurological: Per HPI Musculoskeletal: Denies joint pain, back or neck discomfort. No decrease in ROM Behavioral/Psych: +anxiety  Physical Exam: Vitals:   05/10/18 1129  BP: (!) 125/106  Pulse: 92  Resp: 18  Temp: 98.7 F (37.1 C)  SpO2: 96%   KPS: 90. General: Alert, cooperative, pleasant, in no acute distress Head: Normal EENT: No conjunctival injection or scleral icterus. Oral mucosa moist Lungs: Resp effort normal Cardiac: Regular rate and rhythm Abdomen: Soft, non-distended abdomen Skin: No rashes cyanosis or petechiae. Extremities: no edema  Neurologic Exam: Mental Status: Awake, alert, attentive to examiner. Oriented to self and environment. Language is fluent with intact comprehension.  Cranial Nerves: Visual acuity is grossly normal. Visual fields are full. Extra-ocular movements intact. No ptosis. Face is symmetric, tongue midline. Motor: Tone and bulk are normal. Power is full in both arms and legs. Reflexes are symmetric, no pathologic reflexes present. Intact finger to nose bilaterally Sensory: Intact to light touch and temperature Gait: Normal and tandem gait is normal.   Labs: I have reviewed the data as listed    Component Value Date/Time   NA 140 02/03/2018 1015   K 4.5 02/03/2018 1015   CL 103 02/03/2018 1015   CO2 24 02/03/2018 1015   GLUCOSE 80 02/03/2018 1015   GLUCOSE 118 (H) 01/10/2018 0738   BUN 20 02/03/2018 1015   CREATININE 0.60 05/03/2018 1021   CALCIUM 9.0 02/03/2018 1015   PROT 5.9 (L) 02/03/2018 1015   ALBUMIN 3.7 02/03/2018 1015   AST 15 02/03/2018 1015   ALT 48 (H) 02/03/2018 1015   ALKPHOS 88 02/03/2018 1015   BILITOT 0.4 02/03/2018 1015     GFRNONAA 97 02/03/2018 1015   GFRAA 112 02/03/2018 1015   Lab Results  Component Value Date   WBC 9.8 01/07/2018   NEUTROABS 8.3 (H) 01/07/2018   HGB 12.8 01/07/2018   HCT 39.3 01/07/2018   MCV 95.4 01/07/2018   PLT 330 01/07/2018    Imaging:  [facility] Clinician Interpretation: I have personally reviewed the CNS images as listed.  My interpretation, in the context of the patient's clinical presentation, is progressive disease  Mr Jeri Cos Wo Contrast  Result Date: 05/03/2018 CLINICAL DATA:  Follow-up CNS lymphoma and strokes. EXAM: MRI HEAD WITHOUT AND WITH CONTRAST TECHNIQUE: Multiplanar, multiecho pulse sequences of the brain and surrounding structures were obtained without and with intravenous contrast. CONTRAST:  34mL MULTIHANCE GADOBENATE DIMEGLUMINE  529 MG/ML IV SOLN COMPARISON:  03/01/2018.  01/03/2018.  12/31/2017. FINDINGS: Brain: Diffusion imaging does not show any acute or subacute infarction. There is T2 shine through in the radiating white matter tracts on the left. There is recurrence/worsening of disease on the left. There is recurrent edema within the left temporal lobe, inferior frontal lobe and insular region. There is enlargement of a cyst which previously measured about a cm in size, now measuring 17 mm in diameter, within the substance of the left temporal lobe. There is intraparenchymal enhancement in the mesial temporal lobe measuring 6 mm, adjacent to the cyst measuring 11 mm and recurrent/worsened enhancement along the vessels at the base of the brain, particularly the left MCA branches. I think there is some abnormal enhancement along the surface of the mesial temporal lobe as well. No new distant regions of brain involvement are seen. 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 No evidence of hydrocephalus. No extra-axial collection. Vascular: Major vessels at the base of the brain show flow. Skull and upper cervical spine: Negative except for left pterional  craniotomy changes. Sinuses/Orbits: Clear/normal Other: None IMPRESSION: Recurrence/worsening of disease since the previous exam. Recurrence of more bulky enhancement along vessels at the base of the brain on the left. Recurrent edema. New foci of abnormal parenchymal enhancement within the substance of the left temporal lobe. Enlarging left temporal lobe cyst. Worsened enhancement along the medial surface the left temporal lobe. 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 Electronically Signed   By: Nelson Chimes M.D.   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  The case has been reviewed additionally at [facility] and [facility].  Their recommendation at this time is to repeat MRI in 1 month, because nodular enhancement is too small at this time to target with biopsy.  Recommendation at this time is to initiate lymphoma staging, pending repeat MRI and potential biopsy: -Slit lamp examination -CSF sampling for cell count, protein, cytology, flow cytometry -MRI total spine -Bone marraw aspiration, biopsy -Whole body PET     Will continue to coordinate this workup with Tara [name] as testing is scheduled and performed.  She will return after 1 month with MRI brain for review.  We appreciate the opportunity to participate in the care of [employer]   All questions were answered. The patient knows to call the clinic with any problems, questions or concerns. No barriers to learning were detected.  The total time spent in the encounter was 40 minutes and more than 50% was on counseling and review of test results   [name], MD Medical Director of Neuro-Oncology [facility] [place] 05/10/18 3:00 PM

## 2018-05-12 ENCOUNTER — Other Ambulatory Visit: Payer: Self-pay | Admitting: *Deleted

## 2018-05-12 ENCOUNTER — Telehealth: Payer: Self-pay | Admitting: *Deleted

## 2018-05-12 DIAGNOSIS — C8589 Other specified types of non-Hodgkin lymphoma, extranodal and solid organ sites: Secondary | ICD-10-CM

## 2018-05-12 NOTE — Telephone Encounter (Signed)
Lm for patients son Tara [name] to advise of upcoming appts for MRI Spine, Bone Marrow Biopsy and for MRI of the brain.  We will need to schedule follow up appt for 08/12th or after once all of these appts are completed.

## 2018-05-13 ENCOUNTER — Ambulatory Visit (HOSPITAL_COMMUNITY)
Admission: RE | Admit: 2018-05-13 | Discharge: 2018-05-13 | Disposition: A | Discharge status: Home / Self Care | Payer: Medicare Other | Source: Ambulatory Visit | Attending: Internal Medicine | Admitting: Internal Medicine

## 2018-05-13 DIAGNOSIS — Z79899 Other long term (current) drug therapy: Secondary | ICD-10-CM | POA: Insufficient documentation

## 2018-05-13 DIAGNOSIS — G968 Other specified disorders of central nervous system: Secondary | ICD-10-CM

## 2018-05-13 DIAGNOSIS — G969 Disorder of central nervous system, unspecified: Secondary | ICD-10-CM | POA: Diagnosis not present

## 2018-05-13 DIAGNOSIS — G9689 Other specified disorders of central nervous system: Secondary | ICD-10-CM

## 2018-05-13 DIAGNOSIS — Z7982 Long term (current) use of aspirin: Secondary | ICD-10-CM | POA: Diagnosis not present

## 2018-05-13 DIAGNOSIS — C8589 Other specified types of non-Hodgkin lymphoma, extranodal and solid organ sites: Secondary | ICD-10-CM | POA: Diagnosis not present

## 2018-05-13 LAB — GLUCOSE, CAPILLARY: Glucose-Capillary: 120 mg/dL — ABNORMAL HIGH (ref 70–99)

## 2018-05-13 MED ORDER — FLUDEOXYGLUCOSE F - 18 (FDG) INJECTION: 10.7000 | Freq: Once | INTRAVENOUS | Status: DC | PRN

## 2018-05-16 ENCOUNTER — Ambulatory Visit (HOSPITAL_COMMUNITY)
Admission: RE | Admit: 2018-05-16 | Discharge: 2018-05-16 | Disposition: A | Discharge status: Home / Self Care | Payer: Medicare Other | Source: Ambulatory Visit | Attending: Internal Medicine | Admitting: Internal Medicine

## 2018-05-16 ENCOUNTER — Other Ambulatory Visit: Payer: Self-pay | Admitting: Internal Medicine

## 2018-05-16 DIAGNOSIS — R836 Abnormal cytological findings in cerebrospinal fluid: Secondary | ICD-10-CM | POA: Diagnosis not present

## 2018-05-16 DIAGNOSIS — G968 Other specified disorders of central nervous system: Secondary | ICD-10-CM

## 2018-05-16 DIAGNOSIS — G969 Disorder of central nervous system, unspecified: Secondary | ICD-10-CM | POA: Insufficient documentation

## 2018-05-16 DIAGNOSIS — C8589 Other specified types of non-Hodgkin lymphoma, extranodal and solid organ sites: Secondary | ICD-10-CM

## 2018-05-16 DIAGNOSIS — G9689 Other specified disorders of central nervous system: Secondary | ICD-10-CM

## 2018-05-16 LAB — PROTEIN, CSF: Total  Protein, CSF: 64 mg/dL — ABNORMAL HIGH (ref 15–45)

## 2018-05-16 LAB — GLUCOSE, CSF: Glucose, CSF: 54 mg/dL (ref 40–70)

## 2018-05-16 MED ORDER — LIDOCAINE HCL (PF) 1 % IJ SOLN
5.0000 mL | Freq: Once | INTRAMUSCULAR | Status: AC
  Administered 2018-05-16: 5 mL via INTRADERMAL

## 2018-05-16 NOTE — Progress Notes (Signed)
CRITICAL VALUE ALERT  Critical Value:  CSF WBC'S 12  Date & Time Notied:  05/16/18 1300  Provider Notified: DR Mickeal Skinner   Orders Received/Actions taken: NO NEW ORDERS

## 2018-05-16 NOTE — Progress Notes (Signed)
Dr Ardeen Garland notified of b/p and per Dr Ardeen Garland will call Dr Renda Rolls office

## 2018-05-16 NOTE — Progress Notes (Signed)
Dr Mickeal Skinner notified of B/P's and no new orders noted

## 2018-05-16 NOTE — Discharge Instructions (Signed)

## 2018-05-17 LAB — CSF CELL COUNT WITH DIFFERENTIAL
Eosinophils, CSF: 1 % (ref 0–1)
Lymphs, CSF: 78 % (ref 40–80)
Monocyte-Macrophage-Spinal Fluid: 19 % (ref 15–45)
RBC Count, CSF: 1 /mm3 — ABNORMAL HIGH
Segmented Neutrophils-CSF: 2 % (ref 0–6)
Tube #: 3
WBC, CSF: 12 /mm3 (ref 0–5)

## 2018-05-18 DIAGNOSIS — H10413 Chronic giant papillary conjunctivitis, bilateral: Secondary | ICD-10-CM | POA: Diagnosis not present

## 2018-05-18 DIAGNOSIS — H2513 Age-related nuclear cataract, bilateral: Secondary | ICD-10-CM | POA: Diagnosis not present

## 2018-05-18 DIAGNOSIS — H53469 Homonymous bilateral field defects, unspecified side: Secondary | ICD-10-CM | POA: Diagnosis not present

## 2018-05-18 DIAGNOSIS — H353131 Nonexudative age-related macular degeneration, bilateral, early dry stage: Secondary | ICD-10-CM | POA: Diagnosis not present

## 2018-05-23 ENCOUNTER — Ambulatory Visit (HOSPITAL_COMMUNITY)
Admission: RE | Admit: 2018-05-23 | Discharge: 2018-05-23 | Disposition: A | Discharge status: Home / Self Care | Payer: Medicare Other | Source: Ambulatory Visit | Attending: Internal Medicine | Admitting: Internal Medicine

## 2018-05-23 DIAGNOSIS — M4316 Spondylolisthesis, lumbar region: Secondary | ICD-10-CM | POA: Diagnosis not present

## 2018-05-23 DIAGNOSIS — M5030 Other cervical disc degeneration, unspecified cervical region: Secondary | ICD-10-CM | POA: Diagnosis not present

## 2018-05-23 DIAGNOSIS — C8589 Other specified types of non-Hodgkin lymphoma, extranodal and solid organ sites: Secondary | ICD-10-CM

## 2018-05-23 DIAGNOSIS — M4802 Spinal stenosis, cervical region: Secondary | ICD-10-CM | POA: Diagnosis not present

## 2018-05-23 DIAGNOSIS — M5136 Other intervertebral disc degeneration, lumbar region: Secondary | ICD-10-CM | POA: Diagnosis not present

## 2018-05-23 DIAGNOSIS — K8020 Calculus of gallbladder without cholecystitis without obstruction: Secondary | ICD-10-CM | POA: Diagnosis not present

## 2018-05-23 DIAGNOSIS — M47812 Spondylosis without myelopathy or radiculopathy, cervical region: Secondary | ICD-10-CM | POA: Diagnosis not present

## 2018-05-23 DIAGNOSIS — M48061 Spinal stenosis, lumbar region without neurogenic claudication: Secondary | ICD-10-CM | POA: Diagnosis not present

## 2018-05-23 MED ORDER — GADOBENATE DIMEGLUMINE 529 MG/ML IV SOLN
20.0000 mL | Freq: Once | INTRAVENOUS | Status: AC | PRN
  Administered 2018-05-23: 20 mL via INTRAVENOUS

## 2018-05-27 ENCOUNTER — Inpatient Hospital Stay: Payer: Medicare Other

## 2018-05-27 ENCOUNTER — Inpatient Hospital Stay (HOSPITAL_BASED_OUTPATIENT_CLINIC_OR_DEPARTMENT_OTHER): Payer: Medicare Other | Admitting: Adult Health

## 2018-05-27 ENCOUNTER — Telehealth: Payer: Self-pay | Admitting: Adult Health

## 2018-05-27 VITALS — BP 155/91 | HR 77 | Temp 98.5°F | Resp 19

## 2018-05-27 DIAGNOSIS — D704 Cyclic neutropenia: Secondary | ICD-10-CM | POA: Diagnosis not present

## 2018-05-27 DIAGNOSIS — G939 Disorder of brain, unspecified: Secondary | ICD-10-CM | POA: Diagnosis not present

## 2018-05-27 DIAGNOSIS — G968 Other specified disorders of central nervous system: Principal | ICD-10-CM

## 2018-05-27 DIAGNOSIS — G9689 Other specified disorders of central nervous system: Secondary | ICD-10-CM

## 2018-05-27 DIAGNOSIS — R569 Unspecified convulsions: Secondary | ICD-10-CM

## 2018-05-27 DIAGNOSIS — D432 Neoplasm of uncertain behavior of brain, unspecified: Secondary | ICD-10-CM | POA: Diagnosis not present

## 2018-05-27 LAB — CBC WITH DIFFERENTIAL (CANCER CENTER ONLY)
Basophils Absolute: 0 10*3/uL (ref 0.0–0.1)
Basophils Relative: 0 %
Eosinophils Absolute: 0.1 10*3/uL (ref 0.0–0.5)
Eosinophils Relative: 2 %
HCT: 38 % (ref 34.8–46.6)
Hemoglobin: 12.2 g/dL (ref 11.6–15.9)
Lymphocytes Relative: 17 %
Lymphs Abs: 1 10*3/uL (ref 0.9–3.3)
MCH: 31 pg (ref 25.1–34.0)
MCHC: 32.1 g/dL (ref 31.5–36.0)
MCV: 96.4 fL (ref 79.5–101.0)
Monocytes Absolute: 0.5 10*3/uL (ref 0.1–0.9)
Monocytes Relative: 9 %
Neutro Abs: 4.2 10*3/uL (ref 1.5–6.5)
Neutrophils Relative %: 72 %
Platelet Count: 358 10*3/uL (ref 145–400)
RBC: 3.94 MIL/uL (ref 3.70–5.45)
RDW: 12.5 % (ref 11.2–14.5)
WBC Count: 5.8 10*3/uL (ref 3.9–10.3)

## 2018-05-27 MED ORDER — LIDOCAINE HCL 2 % IJ SOLN
INTRAMUSCULAR | Status: AC
  Filled 2018-05-27: qty 20

## 2018-05-27 NOTE — Patient Instructions (Signed)
Bone Marrow Aspiration and Bone Marrow Biopsy, Adult, Care After This sheet gives you information about how to care for yourself after your procedure. Your health care provider may also give you more specific instructions. If you have problems or questions, contact your health care provider. What can I expect after the procedure? After the procedure, it is common to have:  Mild pain and tenderness.  Swelling.  Bruising.  Follow these instructions at home:  Take over-the-counter or prescription medicines only as told by your health care provider.  Do not take baths, swim, or use a hot tub until your health care provider approves. Ask if you can take a shower or have a sponge bath.  Follow instructions from your health care provider about how to take care of the puncture site. Make sure you: ? Wash your hands with soap and water before you change your bandage (dressing). If soap and water are not available, use hand sanitizer. ? Change your dressing as told by your health care provider.  Check your puncture siteevery day for signs of infection. Check for: ? More redness, swelling, or pain. ? More fluid or blood. ? Warmth. ? Pus or a bad smell.  Return to your normal activities as told by your health care provider. Ask your health care provider what activities are safe for you.  Do not drive for 24 hours if you were given a medicine to help you relax (sedative).  Keep all follow-up visits as told by your health care provider. This is important. Contact a health care provider if:  You have more redness, swelling, or pain around the puncture site.  You have more fluid or blood coming from the puncture site.  Your puncture site feels warm to the touch.  You have pus or a bad smell coming from the puncture site.  You have a fever.  Your pain is not controlled with medicine. This information is not intended to replace advice given to you by your health care provider. Make sure  you discuss any questions you have with your health care provider. Document Released: 05/08/2005 Document Revised: 05/08/2016 Document Reviewed: 04/01/2016 Elsevier Interactive Patient Education  2018 [employer].

## 2018-05-27 NOTE — Progress Notes (Signed)
Dressing clean, dry, and intact. Patient denies concerns or complaints on exit.

## 2018-05-27 NOTE — Progress Notes (Signed)
INDICATION: CNS lymphoma    Bone Marrow Biopsy and Aspiration Procedure Note   Informed consent was obtained and potential risks including bleeding, infection and pain were reviewed with the patient.  The patient's name, date of birth, identification, consent and allergies were verified prior to the start of procedure and time out was performed.  The left posterior iliac crest was chosen as the site of biopsy.  The skin was prepped with ChloraPrep.   10 cc of 2% lidocaine was used to provide local anaesthesia.   7 cc of bone marrow aspirate was obtained followed by 1cm biopsy.  Pressure was applied to the biopsy site and bandage was placed over the biopsy site. Patient was made to lie on the back for 30 mins prior to discharge.  The procedure was tolerated well. COMPLICATIONS: None BLOOD LOSS: none The patient was discharged home in stable condition with a 1 week follow up to review results.  Patient was provided with post bone marrow biopsy instructions and instructed to call if there was any bleeding or worsening pain.  Specimens sent for flow cytometry, cytogenetics and additional studies.  Signed Scot Dock, NP

## 2018-05-27 NOTE — Telephone Encounter (Signed)
No orders per 7/26 los.

## 2018-05-30 ENCOUNTER — Other Ambulatory Visit: Payer: Self-pay | Admitting: *Deleted

## 2018-05-30 MED ORDER — LEVETIRACETAM 500 MG PO TABS: 500.0000 mg | ORAL_TABLET | Freq: Two times a day (BID) | ORAL | 0 refills | Status: DC

## 2018-06-03 ENCOUNTER — Telehealth: Payer: Self-pay

## 2018-06-03 NOTE — Telephone Encounter (Signed)
Received a call from patient's son Edwyna Ready stating that patient has had a pain in the middle of her forehead x 3 days. Has been taking Tylenol with some relief. Dr. Mickeal Skinner made aware.

## 2018-06-06 ENCOUNTER — Telehealth: Payer: Self-pay | Admitting: *Deleted

## 2018-06-06 NOTE — Telephone Encounter (Signed)
Patient son called questioning possible side effect of Lisinopril.  He states patient has started having a dry cough and is concerned about laying flat during the MRI. Dr Mickeal Skinner does not want to make any changes at this point until he has seen her face to face.  Suggestions made to help alievate cough during scan.

## 2018-06-08 ENCOUNTER — Encounter (HOSPITAL_COMMUNITY): Payer: Self-pay | Admitting: Internal Medicine

## 2018-06-10 ENCOUNTER — Ambulatory Visit (HOSPITAL_COMMUNITY)
Admission: RE | Admit: 2018-06-10 | Discharge: 2018-06-10 | Disposition: A | Discharge status: Home / Self Care | Payer: Medicare Other | Source: Ambulatory Visit | Attending: Internal Medicine | Admitting: Internal Medicine

## 2018-06-10 DIAGNOSIS — I6381 Other cerebral infarction due to occlusion or stenosis of small artery: Secondary | ICD-10-CM | POA: Diagnosis not present

## 2018-06-10 DIAGNOSIS — G9389 Other specified disorders of brain: Secondary | ICD-10-CM

## 2018-06-10 DIAGNOSIS — C8589 Other specified types of non-Hodgkin lymphoma, extranodal and solid organ sites: Secondary | ICD-10-CM | POA: Diagnosis not present

## 2018-06-10 DIAGNOSIS — G939 Disorder of brain, unspecified: Secondary | ICD-10-CM | POA: Diagnosis present

## 2018-06-10 MED ORDER — GADOBENATE DIMEGLUMINE 529 MG/ML IV SOLN
20.0000 mL | Freq: Once | INTRAVENOUS | Status: AC | PRN
  Administered 2018-06-10: 20 mL via INTRAVENOUS

## 2018-06-13 ENCOUNTER — Inpatient Hospital Stay: Payer: Medicare Other | Attending: Internal Medicine | Admitting: Internal Medicine

## 2018-06-13 ENCOUNTER — Telehealth: Payer: Self-pay | Admitting: Internal Medicine

## 2018-06-13 ENCOUNTER — Encounter: Payer: Self-pay | Admitting: Internal Medicine

## 2018-06-13 VITALS — BP 168/92 | HR 85 | Temp 98.5°F | Resp 20 | Ht 66.0 in | Wt 231.4 lb

## 2018-06-13 DIAGNOSIS — C8599 Non-Hodgkin lymphoma, unspecified, extranodal and solid organ sites: Secondary | ICD-10-CM

## 2018-06-13 DIAGNOSIS — Z87891 Personal history of nicotine dependence: Secondary | ICD-10-CM | POA: Diagnosis not present

## 2018-06-13 DIAGNOSIS — I700 Atherosclerosis of aorta: Secondary | ICD-10-CM | POA: Insufficient documentation

## 2018-06-13 DIAGNOSIS — Z800 Family history of malignant neoplasm of digestive organs: Secondary | ICD-10-CM | POA: Diagnosis not present

## 2018-06-13 DIAGNOSIS — Z7982 Long term (current) use of aspirin: Secondary | ICD-10-CM | POA: Diagnosis not present

## 2018-06-13 DIAGNOSIS — G839 Paralytic syndrome, unspecified: Secondary | ICD-10-CM | POA: Diagnosis not present

## 2018-06-13 DIAGNOSIS — R51 Headache: Secondary | ICD-10-CM | POA: Diagnosis not present

## 2018-06-13 DIAGNOSIS — G9389 Other specified disorders of brain: Secondary | ICD-10-CM

## 2018-06-13 DIAGNOSIS — Z79899 Other long term (current) drug therapy: Secondary | ICD-10-CM | POA: Insufficient documentation

## 2018-06-13 DIAGNOSIS — G939 Disorder of brain, unspecified: Secondary | ICD-10-CM | POA: Diagnosis not present

## 2018-06-13 DIAGNOSIS — R569 Unspecified convulsions: Secondary | ICD-10-CM | POA: Diagnosis not present

## 2018-06-13 MED ORDER — LEVETIRACETAM 250 MG PO TABS: 250.0000 mg | ORAL_TABLET | Freq: Two times a day (BID) | ORAL | 5 refills | Status: DC

## 2018-06-13 MED ORDER — LISINOPRIL 10 MG PO TABS: 20.0000 mg | ORAL_TABLET | Freq: Every day | ORAL | 5 refills | Status: DC

## 2018-06-13 NOTE — Progress Notes (Signed)
[facility] [place], [place] [address] [phone]   Interval Evaluation  Date of Service: 06/13/18 Patient Name: Tara [name] Patient MRN: [id number] Patient DOB: 14-May-1951 Provider: [name], MD  Identifying Statement:  Tara [name] is a 67 y.o. 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  Static complaints of occassional word finding difficulty and somewhat impaired memory. She otherwise describes no new or progressive neurologic symptoms.  She has occasional headaches which are mild/moderate.  Does describe mild hair loss and general thinning of hair.  Planning trip to [place] this fall.        Medications: Current Outpatient Medications on File Prior to Visit  Medication Sig Dispense Refill  . aspirin EC 81 MG tablet Take 1 tablet (81 mg total) by mouth daily.    Marland Kitchen levETIRAcetam (KEPPRA) 500 MG tablet Take 1 tablet (500 mg total) by mouth 2 (two) times daily. 60 tablet 0  . lisinopril (PRINIVIL,ZESTRIL) 10 MG tablet Take 1 tablet (10 mg total) by mouth at bedtime. 30 tablet 5  . Multiple Vitamin (MULTIVITAMIN WITH MINERALS) TABS tablet Take 1 tablet by mouth daily.    . Multiple Vitamins-Minerals (PRESERVISION AREDS PO) Take 2 capsules by mouth daily.     No current facility-administered medications on file prior to visit.     Allergies:  Allergies  Allergen Reactions  . Ether    Past Medical History: No past medical history on file. Past Surgical History:  Past Surgical History:  Procedure Laterality Date  . APPLICATION OF CRANIAL NAVIGATION N/A 01/04/2018   Procedure: APPLICATION OF CRANIAL NAVIGATION;  Surgeon: Erline Levine, MD;  Location: [facility];  Service: Neurosurgery;  Laterality: N/A;  . BRAIN SURGERY    . PR DURAL GRAFT REPAIR,SPINE DEFECT Left 01/04/2018   Procedure: Left  Pterional craniotomy for biopsy with Brainlab;  Surgeon: Erline Levine, MD;  Location: [facility];  Service: Neurosurgery;  Laterality: Left;  Left Pterional craniotomy for biopsy with Brainlab   Social History:  Social History   Socioeconomic History  . Marital status: Married    Spouse name: Not on file  . Number of children: Not on file  . Years of education: Not on file  . Highest education level: Not on file  Occupational History  . Not on file  Social Needs  . Financial resource strain: Not on file  . Food insecurity:    Worry: Not on file    Inability: Not on file  . Transportation needs:    Medical: Not on file    Non-medical: Not on file  Tobacco Use  . Smoking status: Former Smoker    Packs/day: 1.00    Types: Cigarettes  . Smokeless tobacco: Never Used  . Tobacco comment: plans to quit now  Substance and Sexual Activity  . Alcohol use: No    Frequency: Never  . Drug use: No  . Sexual activity: Yes  Lifestyle  . Physical activity:    Days per week: Not on file    Minutes per session: Not on file  . Stress: Not on file  Relationships  . Social connections:    Talks on phone: Not on file    Gets together: Not on file    Attends religious service: Not on file    Active member of club or organization: Not  on file    Attends meetings of clubs or organizations: Not on file    Relationship status: Not on file  . Intimate partner violence:    Fear of current or ex partner: Not on file    Emotionally abused: Not on file    Physically abused: Not on file    Forced sexual activity: Not on file  Other Topics Concern  . Not on file  Social History Narrative  . Not on file   Family History:  Family History  Problem Relation Age of Onset  . Colon cancer Mother   . CAD Father   . Cancer Father   . Diabetes Father   . Heart disease Father   . Hypertension Father   . Diabetes Sister   . Diabetes Brother     Review of Systems: Constitutional: Denies fevers,  chills or abnormal weight loss Eyes: Denies blurriness of vision Ears, nose, mouth, throat, and face: +hair loss Respiratory: Denies cough, dyspnea or wheezes Cardiovascular: Denies palpitation, chest discomfort or lower extremity swelling Gastrointestinal:  Denies nausea, constipation, diarrhea GU: Denies dysuria or incontinence Skin: Denies abnormal skin rashes Neurological: Per HPI Musculoskeletal: Denies joint pain, back or neck discomfort. No decrease in ROM Behavioral/Psych: +anxiety  Physical Exam: Vitals:   06/13/18 1032  BP: (!) 168/92  Pulse: 85  Resp: 20  Temp: 98.5 F (36.9 C)  SpO2: 97%   KPS: 90. General: Alert, cooperative, pleasant, in no acute distress Head: Normal EENT: No conjunctival injection or scleral icterus. Oral mucosa moist Lungs: Resp effort normal Cardiac: Regular rate and rhythm Abdomen: Soft, non-distended abdomen Skin: No rashes cyanosis or petechiae. Extremities: no edema  Neurologic Exam: Mental Status: Awake, alert, attentive to examiner. Oriented to self and environment. Language is fluent with intact comprehension.  Cranial Nerves: Visual acuity is grossly normal. Visual fields are full. Extra-ocular movements intact. No ptosis. Face is symmetric, tongue midline. Motor: Tone and bulk are normal. Power is full in both arms and legs. Reflexes are symmetric, no pathologic reflexes present. Intact finger to nose bilaterally Sensory: Intact to light touch and temperature Gait: Normal and tandem gait is normal.   Labs: I have reviewed the data as listed    Component Value Date/Time   NA 140 02/03/2018 1015   K 4.5 02/03/2018 1015   CL 103 02/03/2018 1015   CO2 24 02/03/2018 1015   GLUCOSE 80 02/03/2018 1015   GLUCOSE 118 (H) 01/10/2018 0738   BUN 20 02/03/2018 1015   CREATININE 0.60 05/03/2018 1021   CALCIUM 9.0 02/03/2018 1015   PROT 5.9 (L) 02/03/2018 1015   ALBUMIN 3.7 02/03/2018 1015   AST 15 02/03/2018 1015   ALT 48 (H)  02/03/2018 1015   ALKPHOS 88 02/03/2018 1015   BILITOT 0.4 02/03/2018 1015   GFRNONAA 97 02/03/2018 1015   GFRAA 112 02/03/2018 1015   Lab Results  Component Value Date   WBC 5.8 05/27/2018   NEUTROABS 4.2 05/27/2018   HGB 12.2 05/27/2018   HCT 38.0 05/27/2018   MCV 96.4 05/27/2018   PLT 358 05/27/2018     Component     Latest Ref Rng & Units 05/16/2018  Tube #      3  Color, CSF     COLORLESS COLORLESS  Appearance, CSF     CLEAR CLEAR  Supernatant      NOT INDICATED  RBC Count, CSF     0 /cu mm 1 (H)  WBC, CSF  0 - 5 /cu mm 12 (HH)  Segmented Neutrophils-CSF     0 - 6 % 2  Lymphs, CSF     40 - 80 % 78  Monocyte-Macrophage-Spinal Fluid     15 - 45 % 19  Eosinophils, CSF     0 - 1 % 1  Glucose, CSF     40 - 70 mg/dL 54  Total  Protein, CSF     15 - 45 mg/dL 64 (H)         Imaging:  [facility] Clinician Interpretation: I have personally reviewed the CNS images as listed.  My interpretation, in the context of the patient's clinical presentation, is stable disease  Mr Jeri Cos Wo Contrast  Result Date: 06/10/2018 CLINICAL DATA:  Follow-up CNS lymphoma.  History of stroke. EXAM: MRI HEAD WITHOUT AND WITH CONTRAST TECHNIQUE: Multiplanar, multiecho pulse sequences of the brain and surrounding structures were obtained without and with intravenous contrast. CONTRAST:  71m MULTIHANCE GADOBENATE DIMEGLUMINE 529 MG/ML IV SOLN COMPARISON:  05/03/2018 FINDINGS: Brain: A 17 mm cyst in the left temporal lobe is unchanged. There is mild surrounding edema which extends into the insular region and which has greatly decreased. Nodular enhancement within and along the surface of the left temporal lobe and enhancement along the left MCA have decreased. A 6 mm nodular focus of enhancement in the medial left temporal lobe on the prior study now measures 4 mm (series 12, image 24). A previous 11 mm nodular focus of left temporal lobe enhancement immediately posterior to the cyst is now more  curvilinear and less masslike (series 12, image 27). No distant abnormal brain enhancement is identified. No acute infarct, intracranial hemorrhage, or extra-axial fluid collection is identified. There is some T2 shine through on diffusion imaging in the posterior left corona radiata. The ventricles are normal in size. Chronic lacunar infarcts in the right basal ganglia, deep cerebral white matter, and left cerebellum are unchanged. Vascular: Major intracranial vascular flow voids are preserved. Skull and upper cervical spine: Left pterional craniotomy. No suspicious marrow lesion. Sinuses/Orbits: Unremarkable orbits. Paranasal sinuses and mastoid air cells are clear. Other: None. IMPRESSION: 1. Improved appearance of the brain with decreased edema and enhancement within the left temporal lobe and decreased enhancement along the left MCA. 2. No new intracranial abnormality. 3. Multiple chronic lacunar infarcts as above. Electronically Signed   By: ALogan BoresM.D.   On: 06/10/2018 15:37   Mr Total Spine Mets Screening  Result Date: 05/23/2018 CLINICAL DATA:  67year old female with with left frontal brain lesion including unusual perivascular enhancement. The lesion responded to Decadron, but recurred following stoppage of steroids. Undergoing workup for possible lymphoma. Staging of the total spine. EXAM: MRI TOTAL SPINE WITHOUT AND WITH CONTRAST TECHNIQUE: Multisequence MR imaging of the spine from the cervical spine to the sacrum was performed prior to and following IV contrast administration. CONTRAST:  235mMULTIHANCE GADOBENATE DIMEGLUMINE 529 MG/ML IV SOLN COMPARISON:  Brain MRI 05/03/2018 and earlier.  PET-CT 05/13/2018. FINDINGS: MRI CERVICAL SPINE FINDINGS Alignment: Straightening of cervical lordosis. Vertebrae: Intact. Visualized bone marrow signal is within normal limits. No marrow edema or evidence of acute osseous abnormality. Cord: No definite cervical spinal cord signal abnormality despite  multilevel degenerative appearing spinal stenosis (series 6, image 7). No abnormal spinal cord or intradural enhancement. No dural thickening. Posterior Fossa, paraspinal tissues: Cervicomedullary junction is within normal limits. The visible paraspinal soft tissues appear normal. Disc levels: Degenerative appearing spinal stenosis with up to mild  spinal cord mass effect at C3-C4, C4-C5, and C6-C7. Disc space loss at the sites with disc bulging, endplate spurring, and posterior element hypertrophy. MRI THORACIC SPINE FINDINGS Segmentation:  Normal. Alignment: Normal to mildly exaggerated upper thoracic kyphosis. Mild straightening of the lower thoracic spine. Vertebrae: Widespread chronic small endplate Schmorl's nodes. There is minimal degenerative appearing marrow edema and enhancement of the T7 anterior inferior endplate. There is a small benign vertebral body hemangioma in T8. No other abnormal marrow signal. Cord: Normal. The conus medullaris is at T12-L1 and appears normal. No abnormal intradural enhancement or dural thickening. Paraspinal and other soft tissues: Negative visible paraspinal soft tissues. Disc levels: No thoracic spinal stenosis. MRI LUMBAR SPINE FINDINGS Segmentation:  Normal. Alignment: Grade 1 spondylolisthesis at L4-L5 and L5-S1. This is more pronounced at the latter measuring 6 millimeters. Vertebrae: Small upper lumbar chronic endplate Schmorl's nodes. No marrow edema or evidence of acute osseous abnormality. Visualized bone marrow signal is within normal limits. Negative visible sacrum and SI joints. Cauda equina: The cauda equina nerve roots appear normal aside from degenerative spinal stenosis. No abnormal intradural enhancement. No dural thickening identified. Paraspinal and other soft tissues: Cholelithiasis. 18 millimeter gallstone identified. Negative visible other abdominal viscera. Negative visible pelvic viscera and paraspinal soft tissues. Disc levels: Mild degenerative  multifactorial spinal stenosis at L2-L3. Severe facet arthropathy at both L4-L5 and L5-S1 associated with spondylolisthesis at each level. Mild to moderate spinal stenosis at L4-L5. No significant stenosis at L5-S1. IMPRESSION: 1. No metastatic disease identified. No abnormal spinal cord signal or intradural enhancement. 2. Degenerative spinal stenosis at multiple levels in both the cervical and lumbar spine. Lower lumbar grade 1 spondylolisthesis. 3. Cholelithiasis. Electronically Signed   By: Genevie Ann M.D.   On: 05/23/2018 12:54   Dg Fluoro Guided Loc Of Needle/cath Tip For Spinal Inject Lt  Result Date: 05/16/2018 CLINICAL DATA:  Evaluate for possible CNS lymphoma EXAM: DIAGNOSTIC LUMBAR PUNCTURE UNDER FLUOROSCOPIC GUIDANCE FLUOROSCOPY TIME:  Fluoroscopy Time:  30 seconds Radiation Exposure Index (if provided by the fluoroscopic device): Number of Acquired Spot Images: 0 PROCEDURE: Informed consent was obtained from the patient prior to the procedure, including potential complications of headache, allergy, and pain. With the patient prone, the lower back was prepped with Betadine. 1% Lidocaine was used for local anesthesia. Lumbar puncture was performed at the L3-4 level using a 20 gauge needle with return of clear CSF with an opening pressure of 21 cm water. 11 ml of CSF were obtained for laboratory studies. The patient tolerated the procedure well and there were no apparent complications. IMPRESSION: Technically successful lumbar puncture under fluoroscopic guidance as above. Electronically Signed   By: Rolm Baptise M.D.   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  EXAM: NUCLEAR MEDICINE PET WHOLE BODY  TECHNIQUE: 11.3 mCi F-18 FDG was injected intravenously. Full-ring PET imaging was performed from the skull base to thigh after the radiotracer. CT data was obtained and used for attenuation correction and anatomic localization.  Fasting  blood glucose: 120 mg/dl  COMPARISON:  Brain MRI 12/31/2017, CT chest abdomen pelvis 01/01/2018  FINDINGS: Mediastinal blood pool activity: SUV max 3.0  HEAD/NECK: Photopenia in the anterior inferior LEFT temporal lobe at treatment site.  Incidental CT findings: Encephalomalacia in the inferior LEFT frontal lobe  CHEST: No hypermetabolic mediastinal or hilar nodes. No suspicious pulmonary nodules on the CT scan.  Incidental CT findings: none  ABDOMEN/PELVIS: No abnormal hypermetabolic  activity within the liver, pancreas, adrenal glands, or spleen. No hypermetabolic lymph nodes in the abdomen or pelvis.  Incidental CT findings: Gallstone noted. LEFT renal calculus. Atherosclerotic calcification of the aorta. Uterus normal.  SKELETON: No focal hypermetabolic activity to suggest skeletal metastasis.  Incidental CT findings: none  EXTREMITIES: No abnormal hypermetabolic activity in the lower extremities.  Incidental CT findings: none  IMPRESSION: 1. No evidence of lymphoma recurrence on whole-body scan. 2. Incidental findings include gallstone, calcified aorta, and LEFT nephrolithiasis.   Electronically Signed   By: Suzy Bouchard M.D.   On: 05/13/2018 12:09   Assessment/Plan Brain mass  Seizures ([facility])   Ms. Cipriani is clinically stable today.  MRI demonstrates improvement of enhancing disease foci compared to prior scan.  This is notable given the absence of any corticosteroids in the interval.    Bone marrow biopsy, whole body PET, and CSF sampling with cytology did not demonstrate evidence of systemic neoplasm.  At this time, formal diagnosis is elusive.  We discussed the possibility of neurosarcoidosis, although this doesn't commonly present without concurrent systemic component.  CNS lymphoma and intravascular lymphoma are still on the differential as well.    She ask that she remain off steroids, and return in 3 months with an MRI brain  for review.  At that time will re-evaluate and consider referral to neuro-immunology.  Keppra can be decreased to 228m BID.  Lisinopril should be increased to 267mdaily given BP over last several visits here.  This should be managed in the future by PCP.  We appreciate the opportunity to participate in the care of [employer]  All questions were answered. The patient knows to call the clinic with any problems, questions or concerns. No barriers to learning were detected.  The total time spent in the encounter was 40 minutes and more than 50% was on counseling and review of test results   ZaVentura SellersMD Medical Director of Neuro-Oncology [facility] [place]8/12/19 10:20 AM

## 2018-06-13 NOTE — Telephone Encounter (Signed)
Appts scheduled AVS/Calendar printed per 8/12 los °

## 2018-06-14 ENCOUNTER — Other Ambulatory Visit: Payer: Self-pay | Admitting: *Deleted

## 2018-06-14 DIAGNOSIS — G968 Other specified disorders of central nervous system: Principal | ICD-10-CM

## 2018-06-14 DIAGNOSIS — G9689 Other specified disorders of central nervous system: Secondary | ICD-10-CM

## 2018-06-15 ENCOUNTER — Other Ambulatory Visit: Payer: Self-pay | Admitting: Radiation Therapy

## 2018-06-15 NOTE — Progress Notes (Signed)
MDT

## 2018-06-20 ENCOUNTER — Other Ambulatory Visit: Payer: Medicare Other

## 2018-06-23 NOTE — Progress Notes (Signed)
Brain and Spine Tumor Board Documentation  Tara [name] was presented by Tara Cobbs, MD at Brain and Spine Tumor Board on 06/23/2018, which included representatives from neuro oncology, radiation oncology, surgical oncology, radiology, pathology, navigation, genetics.  Tara [name] was presented as a current patient with history of the following treatments:  .  Additionally, we reviewed previous medical and familial history, history of present illness, and recent lab results along with all available histopathologic and imaging studies. The tumor board considered available treatment options and made the following recommendations:  Active surveillance  Tumor board is a meeting of clinicians from various specialty areas who evaluate and discuss patients for whom a multidisciplinary approach is being considered. Final determinations in the plan of care are those of the provider(s). The responsibility for follow up of recommendations given during tumor board is that of the provider.   Today's extended care, comprehensive team conference, Tara [name] was not present for the discussion and was not examined.

## 2018-07-15 ENCOUNTER — Other Ambulatory Visit: Payer: Self-pay | Admitting: Internal Medicine

## 2018-07-15 MED ORDER — LISINOPRIL 20 MG PO TABS: 20.0000 mg | ORAL_TABLET | Freq: Every day | ORAL | 5 refills | Status: DC

## 2018-08-04 ENCOUNTER — Other Ambulatory Visit: Payer: Self-pay | Admitting: *Deleted

## 2018-08-08 ENCOUNTER — Telehealth: Payer: Self-pay | Admitting: Family Medicine

## 2018-08-08 NOTE — Telephone Encounter (Signed)
Copied from [place] [phone]. Topic: General - Other >> Aug 08, 2018 10:42 AM Leward Quan A wrote: Reason for CRM:  Patient son Rya Rausch called to say that mother request a change in blood pressure medication lisinopril (PRINIVIL,ZESTRIL) 20 MG tablet which is causing a dry hacking cough. Asking for a call back and something new to be called in to the pharmacy [id number] - [place], [place] [phone] (Phone) [phone] (Fax)

## 2018-08-09 ENCOUNTER — Encounter: Payer: Self-pay | Admitting: Family Medicine

## 2018-08-09 ENCOUNTER — Ambulatory Visit (INDEPENDENT_AMBULATORY_CARE_PROVIDER_SITE_OTHER): Payer: Medicare Other | Admitting: Family Medicine

## 2018-08-09 ENCOUNTER — Other Ambulatory Visit: Payer: Self-pay

## 2018-08-09 VITALS — BP 150/92 | HR 72 | Temp 97.9°F | Resp 17 | Ht 66.0 in | Wt 223.6 lb

## 2018-08-09 DIAGNOSIS — T464X5A Adverse effect of angiotensin-converting-enzyme inhibitors, initial encounter: Secondary | ICD-10-CM

## 2018-08-09 DIAGNOSIS — I639 Cerebral infarction, unspecified: Secondary | ICD-10-CM | POA: Diagnosis not present

## 2018-08-09 DIAGNOSIS — I10 Essential (primary) hypertension: Secondary | ICD-10-CM

## 2018-08-09 DIAGNOSIS — Z23 Encounter for immunization: Secondary | ICD-10-CM | POA: Diagnosis not present

## 2018-08-09 DIAGNOSIS — Z1211 Encounter for screening for malignant neoplasm of colon: Secondary | ICD-10-CM | POA: Diagnosis not present

## 2018-08-09 DIAGNOSIS — R05 Cough: Secondary | ICD-10-CM | POA: Diagnosis not present

## 2018-08-09 DIAGNOSIS — R058 Other specified cough: Secondary | ICD-10-CM

## 2018-08-09 MED ORDER — DILTIAZEM HCL ER COATED BEADS 180 MG PO CP24: 180.0000 mg | ORAL_CAPSULE | Freq: Every day | ORAL | 1 refills | Status: DC

## 2018-08-09 NOTE — Progress Notes (Signed)
Chief Complaint  Patient presents with  . Blood Pressure Check    ? lisinopril is the cause of cough x 1-2 weeks, neurologist increased lisinopril from 10 mg to 20 mg.  Blood pressures remain high and pt hasn't taken any meds this morning    HPI  Hypertension Lisinopril induced cough She did not take her bp medication this morning She states that she normally takes her lisinopril every morning She states that her Neurologist increased her dose from 10mg  to 20mg  and after that she developed a dry nonproductive cough  She has impaired memory.  Patient here for follow-up of elevated blood pressure. She is exercising by walking and is adherent to low salt diet.  Blood pressure is not well controlled at home. Cardiac symptoms cough. Patient denies chest pain, chest pressure/discomfort, dyspnea, fatigue, lower extremity edema and orthopnea.   She has not achieved good bp control 9/22 153/100 08/02/18 139/95 08/07/18 177/110 08/08/18 151/118  Lab Results  Component Value Date   CREATININE 0.60 05/03/2018   Colon Cancer Screening She has never had a colonoscopy.  She was scheduled but was called and told that since she would need to be put to sleep they would cancel it because of her brain history and tumors. She denies blood in his stool, unexpected weight loss or pain with defecation No rectal itching She does not smoke She has a family history of colon cancer in her father who was diagnosed at age 38   No past medical history on file.  Current Outpatient Medications  Medication Sig Dispense Refill  . aspirin EC 81 MG tablet Take 1 tablet (81 mg total) by mouth daily.    Marland Kitchen levETIRAcetam (KEPPRA) 250 MG tablet Take 1 tablet (250 mg total) by mouth 2 (two) times daily. 60 tablet 5  . Multiple Vitamin (MULTIVITAMIN WITH MINERALS) TABS tablet Take 1 tablet by mouth daily.    . Multiple Vitamins-Minerals (PRESERVISION AREDS PO) Take 2 capsules by mouth daily.    Marland Kitchen diltiazem (CARDIZEM  CD) 180 MG 24 hr capsule Take 1 capsule (180 mg total) by mouth daily. 30 capsule 1   No current facility-administered medications for this visit.     Allergies:  Allergies  Allergen Reactions  . Ether     Past Surgical History:  Procedure Laterality Date  . APPLICATION OF CRANIAL NAVIGATION N/A 01/04/2018   Procedure: APPLICATION OF CRANIAL NAVIGATION;  Surgeon: Erline Levine, MD;  Location: [facility];  Service: Neurosurgery;  Laterality: N/A;  . BRAIN SURGERY    . PR DURAL GRAFT REPAIR,SPINE DEFECT Left 01/04/2018   Procedure: Left Pterional craniotomy for biopsy with Brainlab;  Surgeon: Erline Levine, MD;  Location: [facility];  Service: Neurosurgery;  Laterality: Left;  Left Pterional craniotomy for biopsy with Brainlab    Social History   Socioeconomic History  . Marital status: Married    Spouse name: Not on file  . Number of children: Not on file  . Years of education: Not on file  . Highest education level: Not on file  Occupational History  . Not on file  Social Needs  . Financial resource strain: Not on file  . Food insecurity:    Worry: Not on file    Inability: Not on file  . Transportation needs:    Medical: Not on file    Non-medical: Not on file  Tobacco Use  . Smoking status: Former Smoker    Packs/day: 1.00    Types: Cigarettes  . Smokeless tobacco:  Never Used  . Tobacco comment: plans to quit now  Substance and Sexual Activity  . Alcohol use: No    Frequency: Never  . Drug use: No  . Sexual activity: Yes  Lifestyle  . Physical activity:    Days per week: Not on file    Minutes per session: Not on file  . Stress: Not on file  Relationships  . Social connections:    Talks on phone: Not on file    Gets together: Not on file    Attends religious service: Not on file    Active member of club or organization: Not on file    Attends meetings of clubs or organizations: Not on file    Relationship status: Not on file  Other Topics Concern  . Not on file    Social History Narrative  . Not on file    Family History  Problem Relation Age of Onset  . Colon cancer Mother   . CAD Father   . Cancer Father   . Diabetes Father   . Heart disease Father   . Hypertension Father   . Diabetes Sister   . Diabetes Brother      ROS Review of Systems See HPI Constitution: No fevers or chills No malaise No diaphoresis Skin: No rash or itching Eyes: no blurry vision, no double vision GU: no dysuria or hematuria Neuro: no dizziness or headaches all others reviewed and negative   Objective: Vitals:   08/09/18 0838 08/09/18 0908  BP: (!) 156/106 (!) 150/92  Pulse: 72   Resp: 17   Temp: 97.9 F (36.6 C)   TempSrc: Oral   SpO2: 98%   Weight: 223 lb 9.6 oz (101.4 kg)   Height: 5\' 6"  (1.676 m)     Physical Exam  General Alert and Oriented, no distress Eyes: watery eyes, conjunctiva clear, EOM Ears: TM clear, external canal without exudate or erythema Nose: normal turbinates, no erythema Throat: no pharyngeal exudate, normal tonsils Lymph: no postauricular, submental or cervical adenopathy Heart: normal sinus rhythm, normal rate Lungs: clear to auscultation, no wheezing or crackles   Assessment and Plan Neliah was seen today for blood pressure check.  Diagnoses and all orders for this visit:  Screening for colon cancer- since pt has a history of CNS tumor then will get cologuard for screening -     Cologuard; Future  ACE-inhibitor cough- will stop lisinopril and monitor If cough resolves then we will add ace inhibitor to the allergy list  Reactive hypertension- will change medication from lisinopril to diltiazem -     diltiazem (CARDIZEM CD) 180 MG 24 hr capsule; Take 1 capsule (180 mg total) by mouth daily.  Flu vaccine need -     Flu vaccine HIGH DOSE PF (Fluzone High dose)     Everrett Lacasse A Bonny Egger

## 2018-08-09 NOTE — Telephone Encounter (Signed)
Pt seen in office today and issue resolved see ov note. Dgaddy, CMA

## 2018-08-09 NOTE — Patient Instructions (Addendum)
   If you have lab work done today you will be contacted with your lab results within the next 2 weeks.  If you have not heard from us then please contact us. The fastest way to get your results is to register for My Chart.   IF you received an x-ray today, you will receive an invoice from [facility]. Please contact [facility] at [phone] with questions or concerns regarding your invoice.   IF you received labwork today, you will receive an invoice from [facility]. Please contact [facility] at [phone] with questions or concerns regarding your invoice.   Our billing staff will not be able to assist you with questions regarding bills from these companies.  You will be contacted with the lab results as soon as they are available. The fastest way to get your results is to activate your My Chart account. Instructions are located on the last page of this paperwork. If you have not heard from us regarding the results in 2 weeks, please contact this office.    Influenza (Flu) Vaccine (Inactivated or Recombinant): What You Need to Know 1. Why get vaccinated? Influenza ("flu") is a contagious disease that spreads around the [place] every year, usually between October and May. Flu is caused by influenza viruses, and is spread mainly by coughing, sneezing, and close contact. Anyone can get flu. Flu strikes suddenly and can last several days. Symptoms vary by age, but can include:  fever/chills  sore throat  muscle aches  fatigue  cough  headache  runny or stuffy nose  Flu can also lead to pneumonia and blood infections, and cause diarrhea and seizures in children. If you have a medical condition, such as heart or lung disease, flu can make it worse. Flu is more dangerous for some people. Infants and young children, people 65 years of age and older, pregnant women, and people with certain health conditions or a weakened immune system are at greatest risk. Each  year thousands of people in the [place] die from flu, and many more are hospitalized. Flu vaccine can:  keep you from getting flu,  make flu less severe if you do get it, and  keep you from spreading flu to your family and other people. 2. Inactivated and recombinant flu vaccines A dose of flu vaccine is recommended every flu season. Children 6 months through 8 years of age may need two doses during the same flu season. Everyone else needs only one dose each flu season. Some inactivated flu vaccines contain a very small amount of a mercury-based preservative called thimerosal. Studies have not shown thimerosal in vaccines to be harmful, but flu vaccines that do not contain thimerosal are available. There is no live flu virus in flu shots. They cannot cause the flu. There are many flu viruses, and they are always changing. Each year a new flu vaccine is made to protect against three or four viruses that are likely to cause disease in the upcoming flu season. But even when the vaccine doesn't exactly match these viruses, it may still provide some protection. Flu vaccine cannot prevent:  flu that is caused by a virus not covered by the vaccine, or  illnesses that look like flu but are not.  It takes about 2 weeks for protection to develop after vaccination, and protection lasts through the flu season. 3. Some people should not get this vaccine Tell the person who is giving you the vaccine:  If you have any severe,   life-threatening allergies. If you ever had a life-threatening allergic reaction after a dose of flu vaccine, or have a severe allergy to any part of this vaccine, you may be advised not to get vaccinated. Most, but not all, types of flu vaccine contain a small amount of egg protein.  If you ever had Guillain-Barr Syndrome (also called GBS). Some people with a history of GBS should not get this vaccine. This should be discussed with your doctor.  If you are not feeling well.  It is usually okay to get flu vaccine when you have a mild illness, but you might be asked to come back when you feel better.  4. Risks of a vaccine reaction With any medicine, including vaccines, there is a chance of reactions. These are usually mild and go away on their own, but serious reactions are also possible. Most people who get a flu shot do not have any problems with it. Minor problems following a flu shot include:  soreness, redness, or swelling where the shot was given  hoarseness  sore, red or itchy eyes  cough  fever  aches  headache  itching  fatigue  If these problems occur, they usually begin soon after the shot and last 1 or 2 days. More serious problems following a flu shot can include the following:  There may be a small increased risk of Guillain-Barre Syndrome (GBS) after inactivated flu vaccine. This risk has been estimated at 1 or 2 additional cases per million people vaccinated. This is much lower than the risk of severe complications from flu, which can be prevented by flu vaccine.  Young children who get the flu shot along with pneumococcal vaccine (PCV13) and/or DTaP vaccine at the same time might be slightly more likely to have a seizure caused by fever. Ask your doctor for more information. Tell your doctor if a child who is getting flu vaccine has ever had a seizure.  Problems that could happen after any injected vaccine:  People sometimes faint after a medical procedure, including vaccination. Sitting or lying down for about 15 minutes can help prevent fainting, and injuries caused by a fall. Tell your doctor if you feel dizzy, or have vision changes or ringing in the ears.  Some people get severe pain in the shoulder and have difficulty moving the arm where a shot was given. This happens very rarely.  Any medication can cause a severe allergic reaction. Such reactions from a vaccine are very rare, estimated at about 1 in a million doses, and  would happen within a few minutes to a few hours after the vaccination. As with any medicine, there is a very remote chance of a vaccine causing a serious injury or death. The safety of vaccines is always being monitored. For more information, visit: www.cdc.gov/vaccinesafety/ 5. What if there is a serious reaction? What should I look for? Look for anything that concerns you, such as signs of a severe allergic reaction, very high fever, or unusual behavior. Signs of a severe allergic reaction can include hives, swelling of the face and throat, difficulty breathing, a fast heartbeat, dizziness, and weakness. These would start a few minutes to a few hours after the vaccination. What should I do?  If you think it is a severe allergic reaction or other emergency that can't wait, call 9-1-1 and get the person to the nearest hospital. Otherwise, call your doctor.  Reactions should be reported to the Vaccine Adverse Event Reporting System (VAERS). Your doctor should file   this report, or you can do it yourself through the VAERS web site at www.vaers.hhs.gov, or by calling 1-800-822-7967. ? VAERS does not give medical advice. 6. The National Vaccine Injury Compensation Program The National Vaccine Injury Compensation Program (VICP) is a federal program that was created to compensate people who may have been injured by certain vaccines. Persons who believe they may have been injured by a vaccine can learn about the program and about filing a claim by calling 1-800-338-2382 or visiting the VICP website at www.hrsa.gov/vaccinecompensation. There is a time limit to file a claim for compensation. 7. How can I learn more?  Ask your healthcare provider. He or she can give you the vaccine package insert or suggest other sources of information.  Call your local or state health department.  Contact the Centers for Disease Control and Prevention (CDC): ? Call 1-800-232-4636 (1-800-CDC-INFO) or ? Visit CDC's  website at www.cdc.gov/flu Vaccine Information Statement, Inactivated Influenza Vaccine (06/08/2014) This information is not intended to replace advice given to you by your health care provider. Make sure you discuss any questions you have with your health care provider. Document Released: 08/13/2006 Document Revised: 07/09/2016 Document Reviewed: 07/09/2016 Elsevier Interactive Patient Education  2017 Elsevier Inc.  

## 2018-08-10 NOTE — Addendum Note (Signed)
Addended by: Gari Crown D on: 08/10/2018 09:47 AM   Modules accepted: Orders

## 2018-08-18 ENCOUNTER — Other Ambulatory Visit: Payer: Self-pay | Admitting: *Deleted

## 2018-08-18 DIAGNOSIS — G9389 Other specified disorders of brain: Secondary | ICD-10-CM

## 2018-08-23 DIAGNOSIS — Z1211 Encounter for screening for malignant neoplasm of colon: Secondary | ICD-10-CM | POA: Diagnosis not present

## 2018-08-30 LAB — COLOGUARD: Cologuard: POSITIVE

## 2018-08-30 NOTE — Addendum Note (Signed)
Addended by: Gari Crown D on: 08/30/2018 09:08 AM   Modules accepted: Orders

## 2018-08-31 ENCOUNTER — Other Ambulatory Visit: Payer: Self-pay | Admitting: Family Medicine

## 2018-08-31 ENCOUNTER — Telehealth: Payer: Self-pay | Admitting: Family Medicine

## 2018-08-31 DIAGNOSIS — I10 Essential (primary) hypertension: Secondary | ICD-10-CM

## 2018-08-31 DIAGNOSIS — R195 Other fecal abnormalities: Secondary | ICD-10-CM

## 2018-08-31 NOTE — Telephone Encounter (Signed)
Discussed positive cologuard with the patient and patient is agreeable to colonoscopy for follow up.

## 2018-09-05 DIAGNOSIS — H2513 Age-related nuclear cataract, bilateral: Secondary | ICD-10-CM | POA: Diagnosis not present

## 2018-09-05 DIAGNOSIS — H53461 Homonymous bilateral field defects, right side: Secondary | ICD-10-CM | POA: Diagnosis not present

## 2018-09-05 DIAGNOSIS — H353131 Nonexudative age-related macular degeneration, bilateral, early dry stage: Secondary | ICD-10-CM | POA: Diagnosis not present

## 2018-09-05 DIAGNOSIS — H10413 Chronic giant papillary conjunctivitis, bilateral: Secondary | ICD-10-CM | POA: Diagnosis not present

## 2018-09-06 ENCOUNTER — Ambulatory Visit (HOSPITAL_COMMUNITY)
Admission: RE | Admit: 2018-09-06 | Discharge: 2018-09-06 | Disposition: A | Discharge status: Home / Self Care | Payer: Medicare Other | Source: Ambulatory Visit | Attending: Internal Medicine | Admitting: Internal Medicine

## 2018-09-06 DIAGNOSIS — G968 Other specified disorders of central nervous system: Secondary | ICD-10-CM | POA: Diagnosis not present

## 2018-09-06 DIAGNOSIS — C729 Malignant neoplasm of central nervous system, unspecified: Secondary | ICD-10-CM | POA: Diagnosis not present

## 2018-09-06 DIAGNOSIS — G9689 Other specified disorders of central nervous system: Secondary | ICD-10-CM

## 2018-09-06 MED ORDER — GADOBUTROL 1 MMOL/ML IV SOLN
10.0000 mL | Freq: Once | INTRAVENOUS | Status: AC | PRN
  Administered 2018-09-06: 10 mL via INTRAVENOUS

## 2018-09-07 ENCOUNTER — Other Ambulatory Visit: Payer: Self-pay | Admitting: Radiation Therapy

## 2018-09-09 ENCOUNTER — Telehealth: Payer: Self-pay | Admitting: Family Medicine

## 2018-09-09 ENCOUNTER — Ambulatory Visit (HOSPITAL_COMMUNITY): Payer: Medicare Other

## 2018-09-09 NOTE — Telephone Encounter (Signed)
Copied from [place] [phone]. Topic: General - Inquiry >> Sep 09, 2018 12:03 PM Virl Axe D wrote: Reason for CRM: Chrissie Noa with [employer] reached out to see if Cologuard results were received for Pt. Faxed over on 08/29/18. Reference number [id number]. Please advise [phone]  Spoke to [facility] [place] to affirm we did receive. Referral placed for Colonoscopy by Dr. [name].

## 2018-09-12 ENCOUNTER — Encounter: Payer: Self-pay | Admitting: Family Medicine

## 2018-09-12 ENCOUNTER — Other Ambulatory Visit: Payer: Self-pay

## 2018-09-12 ENCOUNTER — Ambulatory Visit (INDEPENDENT_AMBULATORY_CARE_PROVIDER_SITE_OTHER): Payer: Medicare Other | Admitting: Family Medicine

## 2018-09-12 ENCOUNTER — Inpatient Hospital Stay: Payer: Medicare Other | Attending: Internal Medicine

## 2018-09-12 VITALS — BP 138/90 | HR 78 | Temp 98.2°F | Resp 16 | Ht 66.0 in | Wt 213.6 lb

## 2018-09-12 DIAGNOSIS — R05 Cough: Secondary | ICD-10-CM | POA: Diagnosis not present

## 2018-09-12 DIAGNOSIS — I6930 Unspecified sequelae of cerebral infarction: Secondary | ICD-10-CM

## 2018-09-12 DIAGNOSIS — Z87891 Personal history of nicotine dependence: Secondary | ICD-10-CM | POA: Insufficient documentation

## 2018-09-12 DIAGNOSIS — Z79899 Other long term (current) drug therapy: Secondary | ICD-10-CM | POA: Insufficient documentation

## 2018-09-12 DIAGNOSIS — R569 Unspecified convulsions: Secondary | ICD-10-CM | POA: Insufficient documentation

## 2018-09-12 DIAGNOSIS — T464X5A Adverse effect of angiotensin-converting-enzyme inhibitors, initial encounter: Secondary | ICD-10-CM

## 2018-09-12 DIAGNOSIS — R195 Other fecal abnormalities: Secondary | ICD-10-CM

## 2018-09-12 DIAGNOSIS — Z7982 Long term (current) use of aspirin: Secondary | ICD-10-CM | POA: Insufficient documentation

## 2018-09-12 DIAGNOSIS — I10 Essential (primary) hypertension: Secondary | ICD-10-CM | POA: Diagnosis not present

## 2018-09-12 DIAGNOSIS — Z8673 Personal history of transient ischemic attack (TIA), and cerebral infarction without residual deficits: Secondary | ICD-10-CM | POA: Insufficient documentation

## 2018-09-12 DIAGNOSIS — G9389 Other specified disorders of brain: Secondary | ICD-10-CM | POA: Insufficient documentation

## 2018-09-12 MED ORDER — DILTIAZEM HCL ER COATED BEADS 180 MG PO CP24: ORAL_CAPSULE | ORAL | 1 refills | Status: DC

## 2018-09-12 NOTE — Patient Instructions (Addendum)
Continue your current medications      If you have lab work done today you will be contacted with your lab results within the next 2 weeks.  If you have not heard from [place] then please contact us. The fastest way to get your results is to register for My Chart.   IF you received an x-ray today, you will receive an invoice from [facility]. Please contact [facility] at [phone] with questions or concerns regarding your invoice.   IF you received labwork today, you will receive an invoice from [facility]. Please contact [facility] at [phone] with questions or concerns regarding your invoice.   Our billing staff will not be able to assist you with questions regarding bills from these companies.  You will be contacted with the lab results as soon as they are available. The fastest way to get your results is to activate your My Chart account. Instructions are located on the last page of this paperwork. If you have not heard from [place] regarding the results in 2 weeks, please contact this office.

## 2018-09-12 NOTE — Progress Notes (Signed)
Chief Complaint  Patient presents with  . Hypertension    4 WEEK F/U    HPI  Hypertension: Patient here for follow-up of elevated blood pressure. She is exercising and is adherent to low salt diet.  Blood pressure is well controlled at home. Cardiac symptoms none. Patient denies chest pain, chest pressure/discomfort, claudication, dyspnea, exertional chest pressure/discomfort, fatigue and irregular heart beat.  Cardiovascular risk factors: hypertension.  BP Readings from Last 3 Encounters:  09/12/18 138/90  08/09/18 (!) 150/92  06/13/18 (!) 168/92   Since her last visit when the lisinopril was using Her home readings are typically 127-140s    No past medical history on file.  Current Outpatient Medications  Medication Sig Dispense Refill  . aspirin EC 81 MG tablet Take 1 tablet (81 mg total) by mouth daily.    Marland Kitchen diltiazem (CARDIZEM CD) 180 MG 24 hr capsule TAKE 1 CAPSULE BY MOUTH EVERY DAY 90 capsule 1  . levETIRAcetam (KEPPRA) 250 MG tablet Take 1 tablet (250 mg total) by mouth 2 (two) times daily. 60 tablet 5  . Multiple Vitamin (MULTIVITAMIN WITH MINERALS) TABS tablet Take 1 tablet by mouth daily.    . Multiple Vitamins-Minerals (PRESERVISION AREDS PO) Take 2 capsules by mouth daily.     No current facility-administered medications for this visit.     Allergies:  Allergies  Allergen Reactions  . Ether     Past Surgical History:  Procedure Laterality Date  . APPLICATION OF CRANIAL NAVIGATION N/A 01/04/2018   Procedure: APPLICATION OF CRANIAL NAVIGATION;  Surgeon: Erline Levine, MD;  Location: [facility];  Service: Neurosurgery;  Laterality: N/A;  . BRAIN SURGERY    . PR DURAL GRAFT REPAIR,SPINE DEFECT Left 01/04/2018   Procedure: Left Pterional craniotomy for biopsy with Brainlab;  Surgeon: Erline Levine, MD;  Location: [facility];  Service: Neurosurgery;  Laterality: Left;  Left Pterional craniotomy for biopsy with Brainlab    Social History   Socioeconomic History  . Marital  status: Married    Spouse name: Not on file  . Number of children: Not on file  . Years of education: Not on file  . Highest education level: Not on file  Occupational History  . Not on file  Social Needs  . Financial resource strain: Not on file  . Food insecurity:    Worry: Not on file    Inability: Not on file  . Transportation needs:    Medical: Not on file    Non-medical: Not on file  Tobacco Use  . Smoking status: Former Smoker    Packs/day: 1.00    Types: Cigarettes  . Smokeless tobacco: Never Used  . Tobacco comment: plans to quit now  Substance and Sexual Activity  . Alcohol use: No    Frequency: Never  . Drug use: No  . Sexual activity: Yes  Lifestyle  . Physical activity:    Days per week: Not on file    Minutes per session: Not on file  . Stress: Not on file  Relationships  . Social connections:    Talks on phone: Not on file    Gets together: Not on file    Attends religious service: Not on file    Active member of club or organization: Not on file    Attends meetings of clubs or organizations: Not on file    Relationship status: Not on file  Other Topics Concern  . Not on file  Social History Narrative  . Not on file  Family History  Problem Relation Age of Onset  . Colon cancer Mother   . CAD Father   . Cancer Father   . Diabetes Father   . Heart disease Father   . Hypertension Father   . Diabetes Sister   . Diabetes Brother      ROS Review of Systems See HPI Constitution: No fevers or chills No malaise No diaphoresis Skin: No rash or itching Eyes: no blurry vision, no double vision GU: no dysuria or hematuria Neuro: no dizziness or headaches  all others reviewed and negative   Objective: Vitals:   09/12/18 1038 09/12/18 1053  BP: (!) 166/101 138/90  Pulse: 78   Resp: 16   Temp: 98.2 F (36.8 C)   TempSrc: Oral   SpO2: 98%   Weight: 213 lb 9.6 oz (96.9 kg)   Height: 5\' 6"  (1.676 m)     Physical Exam Physical Exam    Constitutional: He is oriented to person, place, and time. He appears well-developed and well-nourished.  HENT:  Head: Normocephalic and atraumatic.  Eyes: Conjunctivae and EOM are normal.  Cardiovascular: Normal rate, regular rhythm, normal heart sounds and intact distal pulses.  No murmur heard. Pulmonary/Chest: Effort normal and breath sounds normal. No stridor. No respiratory distress. He has no wheezes.  Neurological: He is alert and oriented to person, place, and time.  Skin: Skin is warm. Capillary refill takes less than 2 seconds.  Psychiatric: He has a normal mood and affect. His behavior is normal. Judgment and thought content normal.    Assessment and Plan Eldred was seen today for hypertension.  Diagnoses and all orders for this visit:  Reactive hypertension- bp well controlled on diltiazem Ace inhibitor discontinued due to cough Will add to allergy list -     diltiazem (CARDIZEM CD) 180 MG 24 hr capsule; TAKE 1 CAPSULE BY MOUTH EVERY DAY  ACE-inhibitor cough- resolved with medication change  Positive colorectal cancer screening using Cologuard test- placed a new referral  -     Ambulatory referral to Gastroenterology     [place]

## 2018-09-13 ENCOUNTER — Telehealth: Payer: Self-pay

## 2018-09-13 ENCOUNTER — Inpatient Hospital Stay (HOSPITAL_BASED_OUTPATIENT_CLINIC_OR_DEPARTMENT_OTHER): Payer: Medicare Other | Admitting: Internal Medicine

## 2018-09-13 VITALS — BP 145/90 | HR 75 | Temp 97.8°F | Resp 17 | Ht 66.0 in | Wt 215.3 lb

## 2018-09-13 DIAGNOSIS — Z79899 Other long term (current) drug therapy: Secondary | ICD-10-CM

## 2018-09-13 DIAGNOSIS — G9389 Other specified disorders of brain: Secondary | ICD-10-CM | POA: Diagnosis not present

## 2018-09-13 DIAGNOSIS — Z7982 Long term (current) use of aspirin: Secondary | ICD-10-CM | POA: Diagnosis not present

## 2018-09-13 DIAGNOSIS — Z8673 Personal history of transient ischemic attack (TIA), and cerebral infarction without residual deficits: Secondary | ICD-10-CM | POA: Diagnosis not present

## 2018-09-13 DIAGNOSIS — Z87891 Personal history of nicotine dependence: Secondary | ICD-10-CM | POA: Diagnosis not present

## 2018-09-13 DIAGNOSIS — R569 Unspecified convulsions: Secondary | ICD-10-CM | POA: Diagnosis not present

## 2018-09-13 NOTE — Progress Notes (Signed)
[facility] [place], [place] [address] [phone]   Interval Evaluation  Date of Service: 09/13/18 Patient Name: Tara [name] Patient MRN: [id number] Patient DOB: Aug 04, 1951 Provider: [name], MD  Identifying Statement:  Tara [name] is a 67 y.o. female with left frontal brain lesion   Interval History:  Tara [name] presents today for follow up after recent MRI brain.  She describes no new or progressive neurologic symptoms.  She continues to have occasional headaches which are mild/moderate, typically behind the left eye. Still complains of insomnia.  Medications: Current Outpatient Medications on File Prior to Visit  Medication Sig Dispense Refill  . aspirin EC 81 MG tablet Take 1 tablet (81 mg total) by mouth daily.    Marland Kitchen diltiazem (CARDIZEM CD) 180 MG 24 hr capsule TAKE 1 CAPSULE BY MOUTH EVERY DAY 90 capsule 1  . levETIRAcetam (KEPPRA) 250 MG tablet Take 1 tablet (250 mg total) by mouth 2 (two) times daily. 60 tablet 5  . Multiple Vitamin (MULTIVITAMIN WITH MINERALS) TABS tablet Take 1 tablet by mouth daily.    . Multiple Vitamins-Minerals (PRESERVISION AREDS PO) Take 2 capsules by mouth daily.     No current facility-administered medications on file prior to visit.     Allergies:  Allergies  Allergen Reactions  . Ace Inhibitors Cough  . Ether    Past Medical History: No past medical history on file. Past Surgical History:  Past Surgical History:  Procedure Laterality Date  . APPLICATION OF CRANIAL NAVIGATION N/A 01/04/2018   Procedure: APPLICATION OF CRANIAL NAVIGATION;  Surgeon: Erline Levine, MD;  Location: [facility];  Service: Neurosurgery;  Laterality: N/A;  . BRAIN SURGERY    . PR DURAL GRAFT REPAIR,SPINE DEFECT Left 01/04/2018   Procedure: Left Pterional craniotomy for biopsy with Brainlab;  Surgeon: Erline Levine, MD;  Location: [facility];  Service: Neurosurgery;  Laterality: Left;  Left  Pterional craniotomy for biopsy with Brainlab   Social History:  Social History   Socioeconomic History  . Marital status: Married    Spouse name: Not on file  . Number of children: Not on file  . Years of education: Not on file  . Highest education level: Not on file  Occupational History  . Not on file  Social Needs  . Financial resource strain: Not on file  . Food insecurity:    Worry: Not on file    Inability: Not on file  . Transportation needs:    Medical: Not on file    Non-medical: Not on file  Tobacco Use  . Smoking status: Former Smoker    Packs/day: 1.00    Types: Cigarettes  . Smokeless tobacco: Never Used  . Tobacco comment: plans to quit now  Substance and Sexual Activity  . Alcohol use: No    Frequency: Never  . Drug use: No  . Sexual activity: Yes  Lifestyle  . Physical activity:    Days per week: Not on file    Minutes per session: Not on file  . Stress: Not on file  Relationships  . Social connections:    Talks on phone: Not on file    Gets together: Not on file    Attends religious service: Not on file    Active member of club or organization: Not on file    Attends meetings of clubs or organizations: Not on file    Relationship status: Not on file  .  Intimate partner violence:    Fear of current or ex partner: Not on file    Emotionally abused: Not on file    Physically abused: Not on file    Forced sexual activity: Not on file  Other Topics Concern  . Not on file  Social History Narrative  . Not on file   Family History:  Family History  Problem Relation Age of Onset  . Colon cancer Mother   . CAD Father   . Cancer Father   . Diabetes Father   . Heart disease Father   . Hypertension Father   . Diabetes Sister   . Diabetes Brother     Review of Systems: Constitutional: Denies fevers, chills or abnormal weight loss Eyes: Denies blurriness of vision Ears, nose, mouth, throat, and face: +hair loss Respiratory: Denies cough,  dyspnea or wheezes Cardiovascular: Denies palpitation, chest discomfort or lower extremity swelling Gastrointestinal:  Denies nausea, constipation, diarrhea GU: Denies dysuria or incontinence Skin: Denies abnormal skin rashes Neurological: Per HPI Musculoskeletal: Denies joint pain, back or neck discomfort. No decrease in ROM Behavioral/Psych: +anxiety  Physical Exam: Vitals:   09/13/18 0902  BP: (!) 145/90  Pulse: 75  Resp: 17  Temp: 97.8 F (36.6 C)  SpO2: 99%   KPS: 90. General: Alert, cooperative, pleasant, in no acute distress Head: Normal EENT: No conjunctival injection or scleral icterus. Oral mucosa moist Lungs: Resp effort normal Cardiac: Regular rate and rhythm Abdomen: Soft, non-distended abdomen Skin: No rashes cyanosis or petechiae. Extremities: no edema  Neurologic Exam: Mental Status: Awake, alert, attentive to examiner. Oriented to self and environment. Language is fluent with intact comprehension.  Cranial Nerves: Visual acuity is grossly normal. Visual fields are full. Extra-ocular movements intact. No ptosis. Face is symmetric, tongue midline. Motor: Tone and bulk are normal. Power is full in both arms and legs. Reflexes are symmetric, no pathologic reflexes present. Intact finger to nose bilaterally Sensory: Intact to light touch and temperature Gait: Normal and tandem gait is normal.   Labs: I have reviewed the data as listed    Component Value Date/Time   NA 140 02/03/2018 1015   K 4.5 02/03/2018 1015   CL 103 02/03/2018 1015   CO2 24 02/03/2018 1015   GLUCOSE 80 02/03/2018 1015   GLUCOSE 118 (H) 01/10/2018 0738   BUN 20 02/03/2018 1015   CREATININE 0.60 05/03/2018 1021   CALCIUM 9.0 02/03/2018 1015   PROT 5.9 (L) 02/03/2018 1015   ALBUMIN 3.7 02/03/2018 1015   AST 15 02/03/2018 1015   ALT 48 (H) 02/03/2018 1015   ALKPHOS 88 02/03/2018 1015   BILITOT 0.4 02/03/2018 1015   GFRNONAA 97 02/03/2018 1015   GFRAA 112 02/03/2018 1015   Lab  Results  Component Value Date   WBC 5.8 05/27/2018   NEUTROABS 4.2 05/27/2018   HGB 12.2 05/27/2018   HCT 38.0 05/27/2018   MCV 96.4 05/27/2018   PLT 358 05/27/2018    Imaging:  [facility] Clinician Interpretation: I have personally reviewed the CNS images as listed.  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 Follow-up off of steroids. EXAM: MRI HEAD WITHOUT AND WITH CONTRAST TECHNIQUE: Multiplanar, multiecho pulse sequences of the brain and surrounding structures were obtained without and with intravenous contrast. CONTRAST:  10 cc Gadavist intravenous COMPARISON:  06/10/2018 FINDINGS: Brain: Diseased  left lower sylvian fissure, inferior insula, and anterior temporal lobe. There is stippled enhancement in the anterior left temporal lobe and cuff like enhancement along the left M1 segment that appears similar to prior. There is however decreased nodular enhancement along a dominant cyst in the left temporal lobe, see coronal and sagittal postcontrast imaging. There is also decreased FLAIR signal around a left temporal pole cyst, especially laterally and posteriorly. 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 Remote lacunar infarct in the right periatrial white matter and left cerebellum. No acute infarct, hemorrhage, hydrocephalus, or collection. Vascular: Major flow voids and vascular enhancements are preserved Skull and upper cervical spine: No evidence of marrow lesion Sinuses/Orbits: Negative IMPRESSION: 1. Improved T2 signal/swelling and enhancement within the left temporal lobe. There is incomplete normalization of postcontrast imaging with residual stippled perivascular enhancement, recommend continued follow-up. 2. 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 Electronically Signed    By: Monte Fantasia M.D.   On: 09/07/2018 07:55     Assessment/Plan Brain mass  Seizures ([place])   Tara [name] is clinically and radiographically stable today.  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  There is no disease in the chest or systemically on CT/PET imaging.    We recommended she remain off steroids, and return in 6 months with an MRI brain for review.    Can consider discontinuing Keppra in 6 months if seizure free and stable MRI.  We appreciate the opportunity to participate in the care of [employer]   All questions were answered. The patient knows to call the clinic with any problems, questions or concerns. No barriers to learning were detected.  The total time spent in the encounter was 25 minutes and more than 50% was on counseling and review of test results   [name], MD Medical Director of Neuro-Oncology [facility] [place] 09/13/18 9:10 AM

## 2018-09-13 NOTE — Telephone Encounter (Signed)
Printed avs and calender of upcoming appointment. Per 11/12 los. 

## 2018-09-15 NOTE — Progress Notes (Signed)
Brain and Spine Tumor Board Documentation  Tara [name] was presented by Cecil Cobbs, MD at Brain and Spine Tumor Board on 09/15/2018, which included representatives from neuro oncology, radiation oncology, surgical oncology, radiology, pathology, navigation.  Tara [name] was presented as a current patient with history of the following treatments:  .  Additionally, we reviewed previous medical and familial history, history of present illness, and recent lab results along with all available histopathologic and imaging studies. The tumor board considered available treatment options and made the following recommendations:  Active surveillance    Tumor board is a meeting of clinicians from various specialty areas who evaluate and discuss patients for whom a multidisciplinary approach is being considered. Final determinations in the plan of care are those of the provider(s). The responsibility for follow up of recommendations given during tumor board is that of the provider.   Today's extended care, comprehensive team conference, Tara [name] was not present for the discussion and was not examined.

## 2018-10-06 ENCOUNTER — Encounter: Payer: Self-pay | Admitting: Family Medicine

## 2018-10-10 ENCOUNTER — Telehealth: Payer: Self-pay | Admitting: *Deleted

## 2018-10-10 NOTE — Telephone Encounter (Signed)
Patients son Tara [name] called reqarding issue this past Saturday where patient was unable to think clearly, unable to speak and became agitated during this episode for about 15 minutes starting at 4:30 pm.  Patient reported that she failed to take her Keppra 250 mg as instructed and had delay in her am dose that day.    Per Dr. Mickeal Skinner no change to dose amount.  Reiterated the importance of taking the dose at the same time each day to avoid having any breakthrought seizures.  Instructed to call if she does adhere to dose timing and still has episodes then we might need to adjust dose.  Son expressed understanding, no further questions.

## 2018-11-08 ENCOUNTER — Other Ambulatory Visit: Payer: Self-pay | Admitting: *Deleted

## 2018-11-08 MED ORDER — LEVETIRACETAM 250 MG PO TABS: 250.0000 mg | ORAL_TABLET | Freq: Two times a day (BID) | ORAL | 6 refills | Status: DC

## 2018-12-17 ENCOUNTER — Other Ambulatory Visit: Payer: Self-pay | Admitting: Family Medicine

## 2018-12-17 DIAGNOSIS — I10 Essential (primary) hypertension: Secondary | ICD-10-CM

## 2018-12-19 NOTE — Telephone Encounter (Signed)
Requested Prescriptions  Pending Prescriptions Disp Refills  . diltiazem (CARDIZEM CD) 180 MG 24 hr capsule [Pharmacy Med Name: DILTIAZEM 24H ER(CD) 180 MG CP] 90 capsule 0    Sig: TAKE 1 CAPSULE BY MOUTH EVERY DAY     Cardiovascular:  Calcium Channel Blockers Failed - 12/17/2018 12:11 AM      Failed - Last BP in normal range    BP Readings from Last 1 Encounters:  09/13/18 (!) 145/90         Passed - Valid encounter within last 6 months    Recent Outpatient Visits          3 months ago Reactive hypertension   [facility] at [place], MD   4 months ago ACE-inhibitor cough   [facility] at [place], MD   9 months ago Encounter for [employer] annual wellness exam   [facility] at [place], MD   10 months ago Reactive hypertension   [facility] at [place], MD      Future Appointments            In 2 months Forrest Moron, MD [facility] at [place], [facility]

## 2019-01-05 DIAGNOSIS — H4323 Crystalline deposits in vitreous body, bilateral: Secondary | ICD-10-CM | POA: Diagnosis not present

## 2019-01-05 DIAGNOSIS — H53452 Other localized visual field defect, left eye: Secondary | ICD-10-CM | POA: Diagnosis not present

## 2019-01-14 ENCOUNTER — Other Ambulatory Visit: Payer: Self-pay

## 2019-01-14 ENCOUNTER — Encounter: Payer: Self-pay | Admitting: Osteopathic Medicine

## 2019-01-14 ENCOUNTER — Ambulatory Visit (INDEPENDENT_AMBULATORY_CARE_PROVIDER_SITE_OTHER): Payer: Medicare Other

## 2019-01-14 ENCOUNTER — Ambulatory Visit (INDEPENDENT_AMBULATORY_CARE_PROVIDER_SITE_OTHER): Payer: Medicare Other | Admitting: Osteopathic Medicine

## 2019-01-14 VITALS — BP 162/102 | HR 85 | Temp 97.6°F | Ht 66.0 in | Wt 197.0 lb

## 2019-01-14 DIAGNOSIS — S62115A Nondisplaced fracture of triquetrum [cuneiform] bone, left wrist, initial encounter for closed fracture: Secondary | ICD-10-CM | POA: Diagnosis not present

## 2019-01-14 DIAGNOSIS — S62112A Displaced fracture of triquetrum [cuneiform] bone, left wrist, initial encounter for closed fracture: Secondary | ICD-10-CM | POA: Diagnosis not present

## 2019-01-14 DIAGNOSIS — M79602 Pain in left arm: Secondary | ICD-10-CM

## 2019-01-14 DIAGNOSIS — S59912A Unspecified injury of left forearm, initial encounter: Secondary | ICD-10-CM | POA: Diagnosis not present

## 2019-01-14 DIAGNOSIS — S6992XA Unspecified injury of left wrist, hand and finger(s), initial encounter: Secondary | ICD-10-CM | POA: Diagnosis not present

## 2019-01-14 DIAGNOSIS — M79642 Pain in left hand: Secondary | ICD-10-CM | POA: Diagnosis not present

## 2019-01-14 DIAGNOSIS — M79632 Pain in left forearm: Secondary | ICD-10-CM | POA: Diagnosis not present

## 2019-01-14 MED ORDER — HYDROCODONE-ACETAMINOPHEN 5-325 MG PO TABS: 1.0000 | ORAL_TABLET | Freq: Four times a day (QID) | ORAL | 0 refills | Status: AC | PRN

## 2019-01-14 NOTE — Progress Notes (Signed)
HPI: Tara [name] is a 68 y.o. female who  has no past medical history on file.  she presents to [place] at [facility] today, 01/14/19,  for chief complaint of:  Chief Complaint  Patient presents with  . Fall    left arm pain. fell on left side     . [place] forward onto L arm yesterday evening, tripped over curb. Soreness and swelling in arm/wrist/hand now.      Past medical history, surgical history, and family history reviewed.  Current medication list and allergy/intolerance information reviewed.   (See remainder of HPI, ROS, Phys Exam below)   Dg Forearm Left  Result Date: 01/14/2019 CLINICAL DATA:  Fall, pain EXAM: LEFT FOREARM - 2 VIEW COMPARISON:  None. FINDINGS: No fracture or dislocation is seen in the forearm. The visualized soft tissues are unremarkable. IMPRESSION: No fracture or dislocation is seen in the forearm. Please see dedicated wrist radiographs for additional findings. Electronically Signed   By: Julian Hy M.D.   On: 01/14/2019 10:33   Dg Wrist Complete Left  Result Date: 01/14/2019 CLINICAL DATA:  Left wrist pain EXAM: LEFT WRIST - COMPLETE 3+ VIEW COMPARISON:  None. FINDINGS: Minimally displaced fracture along the ulnar aspect of the wrist, best visualized dorsally on the lateral view, with overlying soft tissue swelling, suggesting a triquetral fracture. Degenerative changes of the 1st carpometacarpal joint. Degenerative changes of the radiocarpal joint. IMPRESSION: Minimally displaced triquetral fracture, as above. Electronically Signed   By: Julian Hy M.D.   On: 01/14/2019 10:32   Dg Hand Complete Left  Result Date: 01/14/2019 CLINICAL DATA:  Fall, pain EXAM: LEFT HAND - COMPLETE 3+ VIEW COMPARISON:  None. FINDINGS: No fracture or dislocation is seen in the hand. Mild degenerative changes of the 1st carpometacarpal joint. The visualized soft tissues are unremarkable. IMPRESSION: No fracture or dislocation is seen in the hand.  Please see dedicated wrist radiographs for additional findings. Electronically Signed   By: Julian Hy M.D.   On: 01/14/2019 10:33    No results found for this or any previous visit (from the past 72 hour(s)).       ASSESSMENT/PLAN:   Closed displaced fracture of triquetrum of left wrist, initial encounter - Plan: Ambulatory referral to Orthopedic Surgery  Left arm pain - Plan: DG Wrist Complete Left, DG Forearm Left, DG Hand Complete Left   Meds ordered this encounter  Medications  . HYDROcodone-acetaminophen (NORCO/VICODIN) 5-325 MG tablet    Sig: Take 1 tablet by mouth every 6 (six) hours as needed for up to 15 days for moderate pain.    Dispense:  18 tablet    Refill:  0    Patient Instructions   [facility] [place], [place]    If you have lab work done today you will be contacted with your lab results within the next 2 weeks.  If you have not heard from [place] then please contact us. The fastest way to get your results is to register for My Chart.   IF you received an x-ray today, you will receive an invoice from [facility]. Please contact [facility]iology at [phone] with questions or concerns regarding your invoice.   IF you received labwork today, you will receive an invoice from [facility]. Please contact [facility] at [phone] with questions or concerns regarding your invoice.   Our billing staff will not be able to assist you with questions regarding bills from these  companies.  You will be contacted with the lab results as soon as they are available. The fastest way to get your results is to activate your My Chart account. Instructions are located on the last page of this paperwork. If you have not heard from [place] regarding the results in 2 weeks, please contact this office.        Follow-up plan: Return for follow-up as directed by  ortho.                    ############################################ ############################################ ############################################ ############################################    Outpatient Encounter Medications as of 01/14/2019  Medication Sig  . aspirin EC 81 MG tablet Take 1 tablet (81 mg total) by mouth daily.  Marland Kitchen diltiazem (CARDIZEM CD) 180 MG 24 hr capsule TAKE 1 CAPSULE BY MOUTH EVERY DAY  . levETIRAcetam (KEPPRA) 250 MG tablet Take 1 tablet (250 mg total) by mouth 2 (two) times daily.  . Multiple Vitamin (MULTIVITAMIN WITH MINERALS) TABS tablet Take 1 tablet by mouth daily.  . Multiple Vitamins-Minerals (PRESERVISION AREDS PO) Take 2 capsules by mouth daily.  Marland Kitchen HYDROcodone-acetaminophen (NORCO/VICODIN) 5-325 MG tablet Take 1 tablet by mouth every 6 (six) hours as needed for up to 15 days for moderate pain.   No facility-administered encounter medications on file as of 01/14/2019.    Allergies  Allergen Reactions  . Ace Inhibitors Cough  . Ether       Review of Systems:  Constitutional: No recent illness  HEENT: No  headache  Respiratory:  No  shortness of breath.   Musculoskeletal: +new myalgia/arthralgia   Exam:  BP (!) 162/102 (BP Location: Right Arm, Patient Position: Sitting, Cuff Size: Normal)   Pulse 85   Temp 97.6 F (36.4 C) (Oral)   Ht 5\' 6"  (1.676 m)   Wt 197 lb (89.4 kg)   SpO2 97%   BMI 31.80 kg/m   Constitutional: VS see above. General Appearance: alert, well-developed, well-nourished, NAD  Eyes: Normal lids and conjunctive, non-icteric sclera  Ears, Nose, Mouth, Throat: MMM, Normal external inspection ears/nares/mouth/lips/gums.  Neck: No masses, trachea midline.   Respiratory: Normal respiratory effort. no wheeze, no rhonchi, no rales  Cardiovascular: S1/S2 normal, no murmur, no rub/gallop auscultated. RRR.   Musculoskeletal: Gait normal. Swelling L wrist, ROM limited d/t pain, sensation  intact   Neurological: Normal balance/coordination. No tremor.  Skin: warm, dry, intact.   Psychiatric: Normal judgment/insight. Normal mood and affect. Oriented x3.   Visit summary with medication list and pertinent instructions was printed for patient to review, advised to alert [place] if any changes needed. All questions at time of visit were answered - patient instructed to contact office with any additional concerns. ER/RTC precautions were reviewed with the patient and understanding verbalized.   Follow-up plan: Return for follow-up as directed by ortho.    Please note: voice recognition software was used to produce this document, and typos may escape review. Please contact Dr. Sheppard Coil for any needed clarifications.

## 2019-01-14 NOTE — Patient Instructions (Addendum)
[facility] [place], [place]    If you have lab work done today you will be contacted with your lab results within the next 2 weeks.  If you have not heard from [place] then please contact us. The fastest way to get your results is to register for My Chart.   IF you received an x-ray today, you will receive an invoice from [facility]. Please contact [facility] Radiology at [phone] with questions or concerns regarding your invoice.   IF you received labwork today, you will receive an invoice from [facility]. Please contact [facility] at [phone] with questions or concerns regarding your invoice.   Our billing staff will not be able to assist you with questions regarding bills from these companies.  You will be contacted with the lab results as soon as they are available. The fastest way to get your results is to activate your My Chart account. Instructions are located on the last page of this paperwork. If you have not heard from [place] regarding the results in 2 weeks, please contact this office.

## 2019-01-23 DIAGNOSIS — S62115D Nondisplaced fracture of triquetrum [cuneiform] bone, left wrist, subsequent encounter for fracture with routine healing: Secondary | ICD-10-CM | POA: Diagnosis not present

## 2019-02-27 DIAGNOSIS — S62115D Nondisplaced fracture of triquetrum [cuneiform] bone, left wrist, subsequent encounter for fracture with routine healing: Secondary | ICD-10-CM | POA: Diagnosis not present

## 2019-03-06 ENCOUNTER — Telehealth: Payer: Self-pay | Admitting: *Deleted

## 2019-03-06 ENCOUNTER — Other Ambulatory Visit: Payer: Self-pay | Admitting: Radiation Therapy

## 2019-03-06 NOTE — Telephone Encounter (Signed)
Patient wants to reschedule MRI and MD follow up until after COVID 19 pandemic subsides some.  Ok per MD.  R/s MD follow up and son advised to call [facility] to reschedule MRI.

## 2019-03-09 ENCOUNTER — Other Ambulatory Visit: Payer: Medicare Other

## 2019-03-09 IMAGING — MR MR HEAD WO/W CM
10 of 14 series · 32 of 48 positions shown · IV contrast (Yes)
Comparison: 05/03/2018

CLINICAL DATA: Follow-up CNS lymphoma.  History of stroke.

EXAM:
MRI HEAD WITHOUT AND WITH CONTRAST
TECHNIQUE: Multiplanar, multiecho pulse sequences of the brain and surrounding
structures were obtained without and with intravenous contrast.
CONTRAST:  20mL MULTIHANCE GADOBENATE DIMEGLUMINE 529 MG/ML IV SOLN

[Series 3: DWI · axial · 3.0mm · 1.09mm/px · z∈[-10,+137]mm · 8 of 106 slices shown (1 of 4)]
[im 1/106]
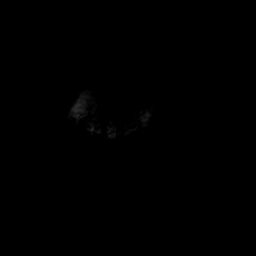
[im 12/106]
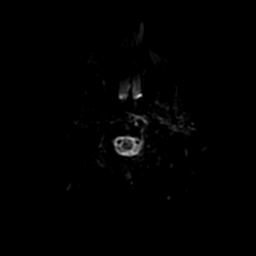
[im 36/106]
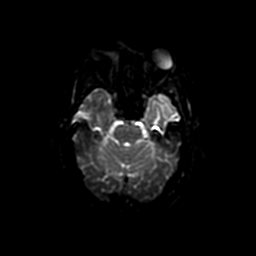
[im 47/106]
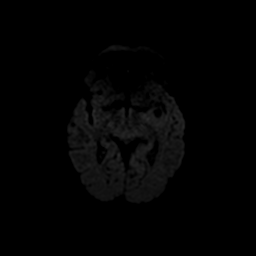
[im 59/106]
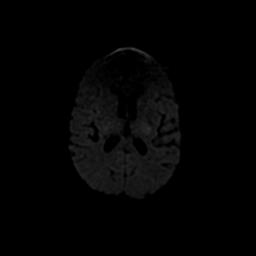
[im 71/106]
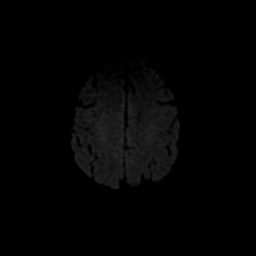
[im 94/106]
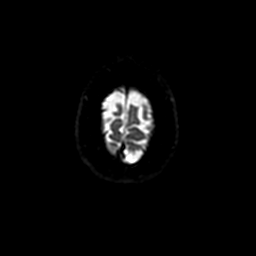
[im 106/106]
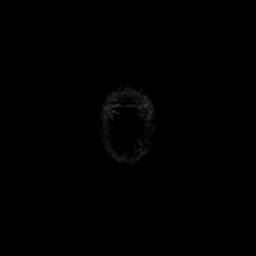

[Series 5: DWI · coronal · 4.0mm · 1.09mm/px · 5 of 65 slices shown (2 of 4)]
[im 1/65]
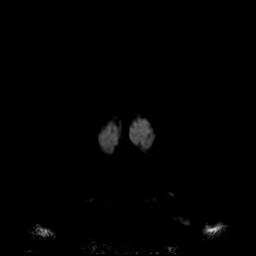
[im 17/65]
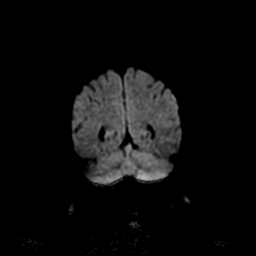
[im 33/65]
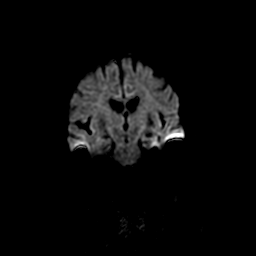
[im 49/65]
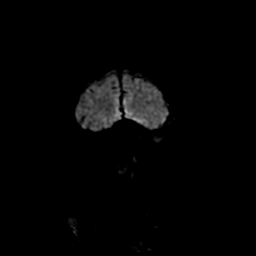
[im 65/65]
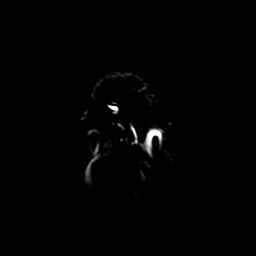

[Series 6: T2 · axial · 5.0mm · 0.43mm/px · z∈[-35,+121]mm · 2 of 24 slices shown (1 of 2)]
[im 1/24]
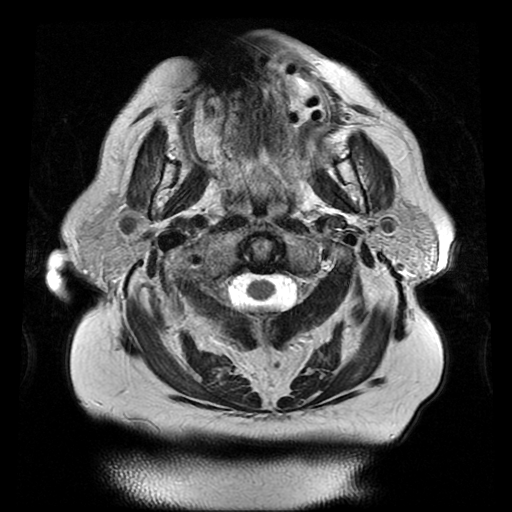
[im 24/24]
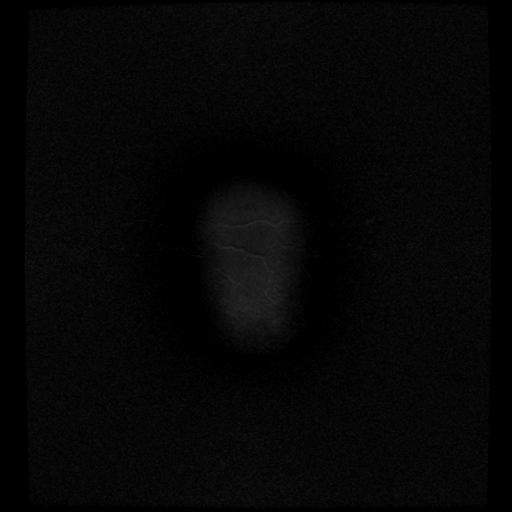

[Series 7: FLAIR · axial · 5.0mm · 0.43mm/px · z∈[-36,+121]mm · 2 of 28 slices shown]
[im 1/28]
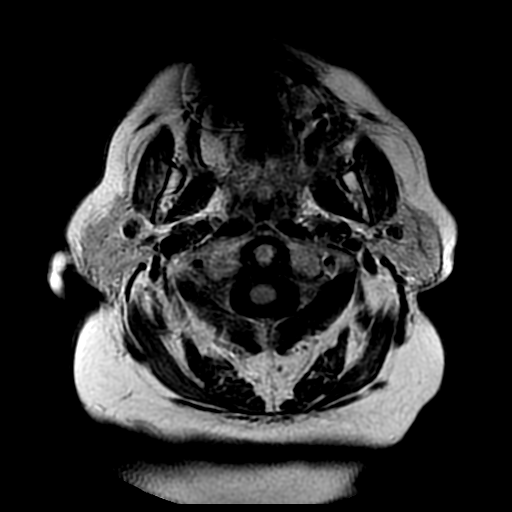
[im 28/28]
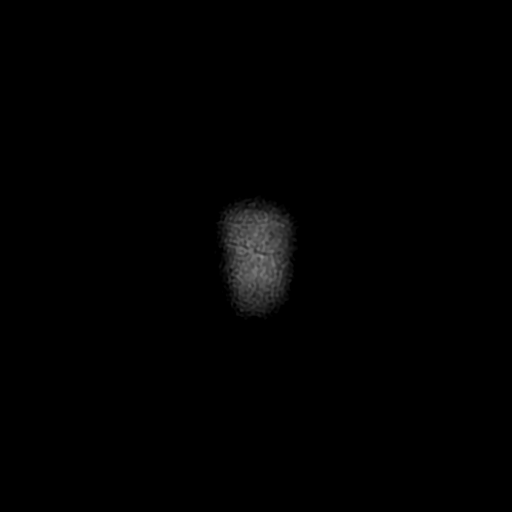

[Series 8: T2 · axial · 5.0mm · 0.43mm/px · z∈[-36,+121]mm · 2 of 28 slices shown (2 of 2)]
[im 1/28]
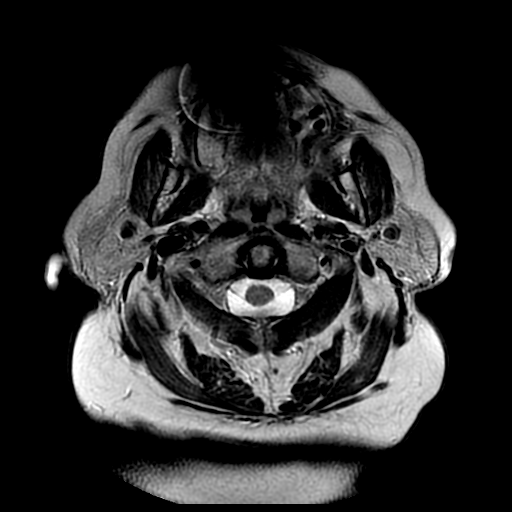
[im 28/28]
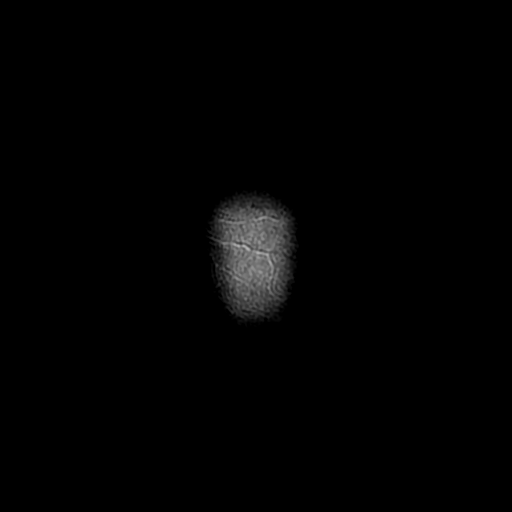

[Series 11: T2 post-contrast · coronal · 5.0mm · 0.45mm/px · 2 of 26 slices shown]
[im 1/26]
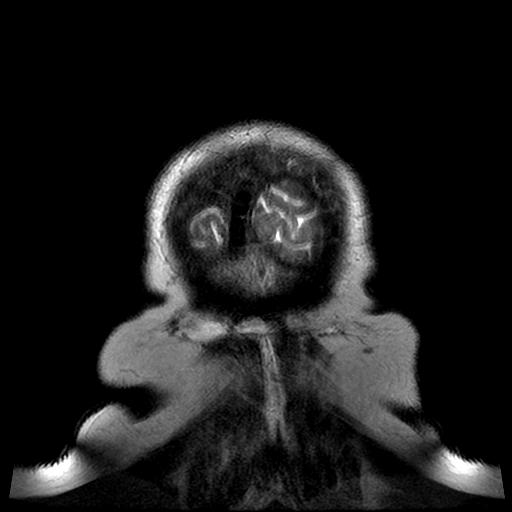
[im 26/26]
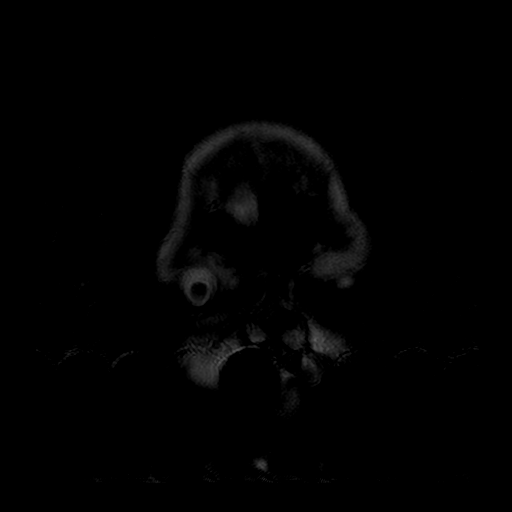

[Series 13: T1 post-contrast · coronal · 5.0mm · 0.45mm/px · 2 of 26 slices shown (1 of 2)]
[im 1/26]
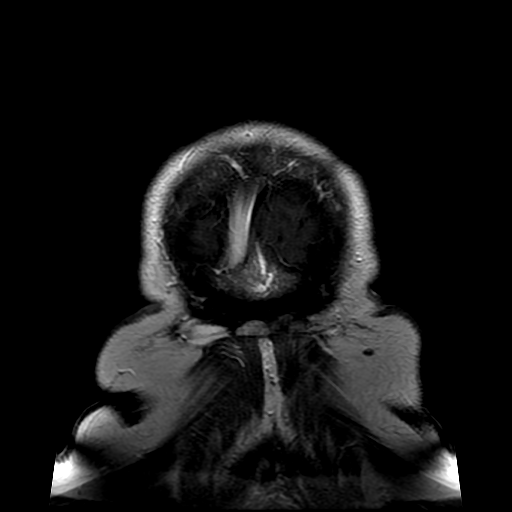
[im 26/26]
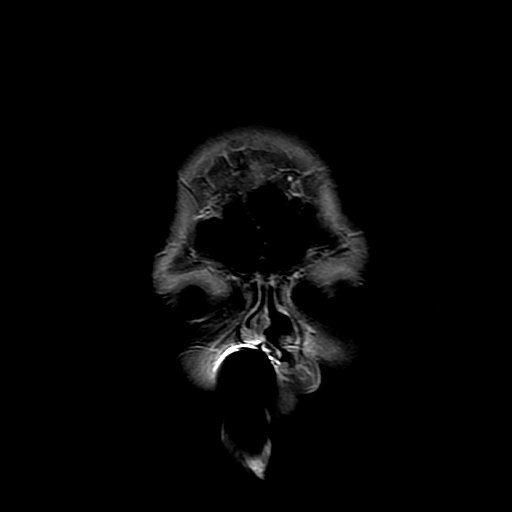

[Series 14: T1 post-contrast · sagittal · 5.0mm · 0.47mm/px · 2 of 24 slices shown (2 of 2)]
[im 1/24]
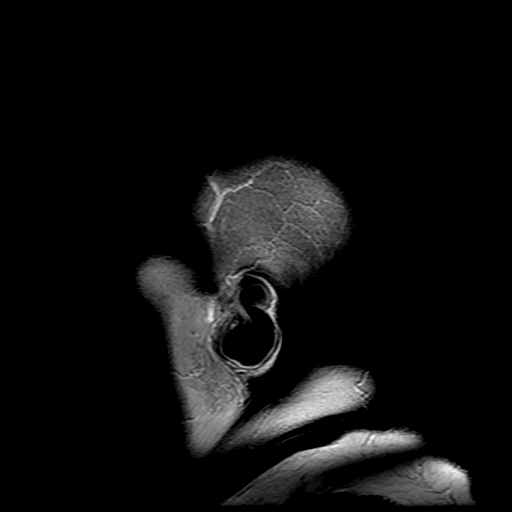
[im 24/24]
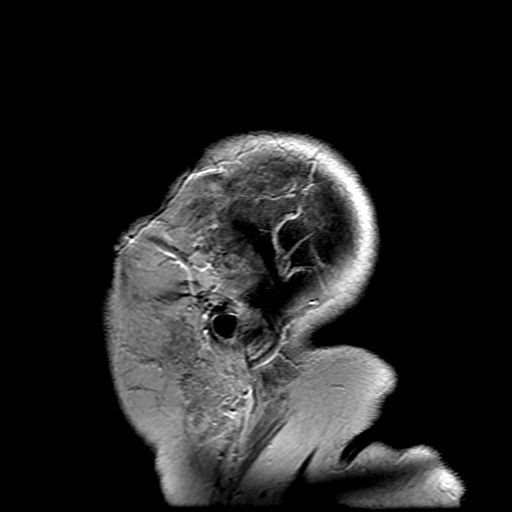

[Series 300: DWI · axial · 3.0mm · 1.09mm/px · z∈[-10,+137]mm · 4 of 53 slices shown (3 of 4)]
[im 1/53]
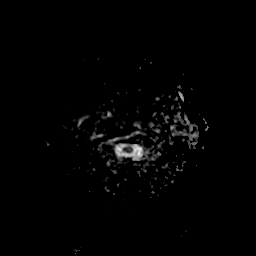
[im 18/53]
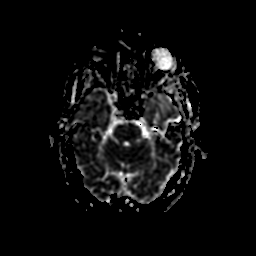
[im 35/53]
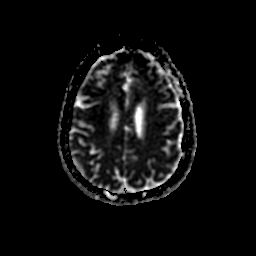
[im 53/53]
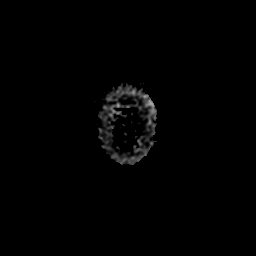

[Series 500: DWI · coronal · 4.0mm · 1.09mm/px · 3 of 33 slices shown (4 of 4)]
[im 1/33]
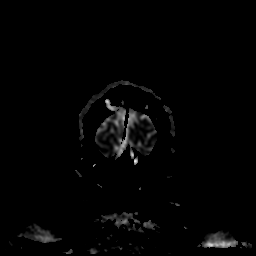
[im 17/33]
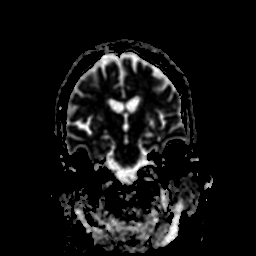
[im 33/33]
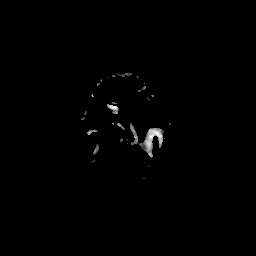

[32 of 48 positions shown; findings below may reference images not displayed]

FINDINGS: Brain: A 17 mm cyst in the left temporal lobe is unchanged. There is
mild surrounding edema which extends into the insular region and
which has greatly decreased. Nodular enhancement within and along
the surface of the left temporal lobe and enhancement along the left
MCA have decreased. A 6 mm nodular focus of enhancement in the
medial left temporal lobe on the prior study now measures 4 mm
(series 12, image 24). A previous 11 mm nodular focus of left
temporal lobe enhancement immediately posterior to the cyst is now
more curvilinear and less masslike (series 12, image 27). No distant
abnormal brain enhancement is identified.

No acute infarct, intracranial hemorrhage, or extra-axial fluid
collection is identified. There is some T2 shine through on
diffusion imaging in the posterior left corona radiata. The
ventricles are normal in size. Chronic lacunar infarcts in the right
basal ganglia, deep cerebral white matter, and left cerebellum are
unchanged.

Vascular: Major intracranial vascular flow voids are preserved.

Skull and upper cervical spine: Left pterional craniotomy. No
suspicious marrow lesion.

Sinuses/Orbits: Unremarkable orbits. Paranasal sinuses and mastoid
air cells are clear.

Other: None.
IMPRESSION: 1. Improved appearance of the brain with decreased edema and
enhancement within the left temporal lobe and decreased enhancement
along the left MCA.
2. No new intracranial abnormality.
3. Multiple chronic lacunar infarcts as above.

## 2019-03-13 ENCOUNTER — Ambulatory Visit: Payer: Medicare Other | Admitting: Family Medicine

## 2019-03-14 ENCOUNTER — Ambulatory Visit: Payer: Medicare Other | Admitting: Internal Medicine

## 2019-04-21 ENCOUNTER — Other Ambulatory Visit: Payer: Self-pay

## 2019-04-21 ENCOUNTER — Ambulatory Visit
Admission: RE | Admit: 2019-04-21 | Discharge: 2019-04-21 | Disposition: A | Discharge status: Home / Self Care | Payer: Medicare Other | Source: Ambulatory Visit | Attending: Internal Medicine | Admitting: Internal Medicine

## 2019-04-21 DIAGNOSIS — G9389 Other specified disorders of brain: Secondary | ICD-10-CM | POA: Diagnosis not present

## 2019-04-21 MED ORDER — GADOBENATE DIMEGLUMINE 529 MG/ML IV SOLN
20.0000 mL | Freq: Once | INTRAVENOUS | Status: AC | PRN
  Administered 2019-04-21: 20 mL via INTRAVENOUS

## 2019-04-25 ENCOUNTER — Encounter: Payer: Self-pay | Admitting: Internal Medicine

## 2019-04-27 ENCOUNTER — Inpatient Hospital Stay: Payer: Medicare Other | Attending: Internal Medicine | Admitting: Internal Medicine

## 2019-04-27 ENCOUNTER — Other Ambulatory Visit: Payer: Self-pay

## 2019-04-27 ENCOUNTER — Telehealth: Payer: Self-pay | Admitting: Internal Medicine

## 2019-04-27 VITALS — BP 140/71 | HR 61 | Temp 97.8°F | Resp 17 | Ht 66.0 in | Wt 202.0 lb

## 2019-04-27 DIAGNOSIS — Z79899 Other long term (current) drug therapy: Secondary | ICD-10-CM | POA: Insufficient documentation

## 2019-04-27 DIAGNOSIS — Z87891 Personal history of nicotine dependence: Secondary | ICD-10-CM

## 2019-04-27 DIAGNOSIS — Z7982 Long term (current) use of aspirin: Secondary | ICD-10-CM | POA: Diagnosis not present

## 2019-04-27 DIAGNOSIS — G939 Disorder of brain, unspecified: Secondary | ICD-10-CM | POA: Diagnosis not present

## 2019-04-27 DIAGNOSIS — G9689 Other specified disorders of central nervous system: Secondary | ICD-10-CM

## 2019-04-27 NOTE — Progress Notes (Signed)
[place] [facility] [place], [place] [address] [phone]   Interval Evaluation  Date of Service: 04/27/19 Patient Name: Tara [name] Patient MRN: [id number] Patient DOB: 16-Nov-1950 Provider: [name], MD  Identifying Statement:  Tara [name] is a 68 y.o. female with left frontal brain lesion   Interval History:  GRANT HENKES presents today for follow up after recent MRI brain.  She describes no new or progressive neurologic symptoms.  She continues to have occasional headaches which are mild/moderate, typically behind the left eye. Sleep issues have improved.  Medications: Current Outpatient Medications on File Prior to Visit  Medication Sig Dispense Refill   aspirin EC 81 MG tablet Take 1 tablet (81 mg total) by mouth daily.     diltiazem (CARDIZEM CD) 180 MG 24 hr capsule TAKE 1 CAPSULE BY MOUTH EVERY DAY 90 capsule 0   levETIRAcetam (KEPPRA) 250 MG tablet Take 1 tablet (250 mg total) by mouth 2 (two) times daily. 60 tablet 6   Multiple Vitamin (MULTIVITAMIN WITH MINERALS) TABS tablet Take 1 tablet by mouth daily.     Multiple Vitamins-Minerals (PRESERVISION AREDS PO) Take 2 capsules by mouth daily.     No current facility-administered medications on file prior to visit.     Allergies:  Allergies  Allergen Reactions   Ace Inhibitors Cough   Ether    Past Medical History: No past medical history on file. Past Surgical History:  Past Surgical History:  Procedure Laterality Date   APPLICATION OF CRANIAL NAVIGATION N/A 01/04/2018   Procedure: APPLICATION OF CRANIAL NAVIGATION;  Surgeon: Erline Levine, MD;  Location: [facility];  Service: Neurosurgery;  Laterality: N/A;   BRAIN SURGERY     PR DURAL GRAFT REPAIR,SPINE DEFECT Left 01/04/2018   Procedure: Left Pterional craniotomy for biopsy with Brainlab;  Surgeon: Erline Levine, MD;  Location: [facility];  Service: Neurosurgery;  Laterality: Left;  Left  Pterional craniotomy for biopsy with Brainlab   Social History:  Social History   Socioeconomic History   Marital status: Married    Spouse name: Not on file   Number of children: Not on file   Years of education: Not on file   Highest education level: Not on file  Occupational History   Not on file  Social Needs   Financial resource strain: Not on file   Food insecurity    Worry: Not on file    Inability: Not on file   Transportation needs    Medical: Not on file    Non-medical: Not on file  Tobacco Use   Smoking status: Former Smoker    Packs/day: 1.00    Types: Cigarettes   Smokeless tobacco: Never Used   Tobacco comment: plans to quit now  Substance and Sexual Activity   Alcohol use: No    Frequency: Never   Drug use: No   Sexual activity: Yes  Lifestyle   Physical activity    Days per week: Not on file    Minutes per session: Not on file   Stress: Not on file  Relationships   Social connections    Talks on phone: Not on file    Gets together: Not on file    Attends religious service: Not on file    Active member of club or organization: Not on file    Attends meetings of clubs or organizations: Not on file    Relationship status: Not on file  Intimate partner violence    Fear of current or ex partner: Not on file    Emotionally abused: Not on file    Physically abused: Not on file    Forced sexual activity: Not on file  Other Topics Concern   Not on file  Social History Narrative   Not on file   Family History:  Family History  Problem Relation Age of Onset   Colon cancer Mother    CAD Father    Cancer Father    Diabetes Father    Heart disease Father    Hypertension Father    Diabetes Sister    Diabetes Brother     Review of Systems: Constitutional: Denies fevers, chills or abnormal weight loss Eyes: Denies blurriness of vision Ears, nose, mouth, throat, and face: +hair loss Respiratory: Denies cough, dyspnea  or wheezes Cardiovascular: Denies palpitation, chest discomfort or lower extremity swelling Gastrointestinal:  Denies nausea, constipation, diarrhea GU: Denies dysuria or incontinence Skin: Denies abnormal skin rashes Neurological: Per HPI Musculoskeletal: Denies joint pain, back or neck discomfort. No decrease in ROM Behavioral/Psych: +anxiety  Physical Exam: Vitals:   04/27/19 0902  BP: 140/71  Pulse: 61  Resp: 17  Temp: 97.8 F (36.6 C)  SpO2: 96%   KPS: 90. General: Alert, cooperative, pleasant, in no acute distress Head: Normal EENT: No conjunctival injection or scleral icterus. Oral mucosa moist Lungs: Resp effort normal Cardiac: Regular rate and rhythm Abdomen: Soft, non-distended abdomen Skin: No rashes cyanosis or petechiae. Extremities: no edema  Neurologic Exam: Mental Status: Awake, alert, attentive to examiner. Oriented to self and environment. Language is fluent with intact comprehension.  Cranial Nerves: Visual acuity is grossly normal. Visual fields are full. Extra-ocular movements intact. No ptosis. Face is symmetric, tongue midline. Motor: Tone and bulk are normal. Power is full in both arms and legs. Reflexes are symmetric, no pathologic reflexes present. Intact finger to nose bilaterally Sensory: Intact to light touch and temperature Gait: Normal and tandem gait is normal.   Labs: I have reviewed the data as listed    Component Value Date/Time   NA 140 02/03/2018 1015   K 4.5 02/03/2018 1015   CL 103 02/03/2018 1015   CO2 24 02/03/2018 1015   GLUCOSE 80 02/03/2018 1015   GLUCOSE 118 (H) 01/10/2018 0738   BUN 20 02/03/2018 1015   CREATININE 0.60 05/03/2018 1021   CALCIUM 9.0 02/03/2018 1015   PROT 5.9 (L) 02/03/2018 1015   ALBUMIN 3.7 02/03/2018 1015   AST 15 02/03/2018 1015   ALT 48 (H) 02/03/2018 1015   ALKPHOS 88 02/03/2018 1015   BILITOT 0.4 02/03/2018 1015   GFRNONAA 97 02/03/2018 1015   GFRAA 112 02/03/2018 1015   Lab Results    Component Value Date   WBC 5.8 05/27/2018   NEUTROABS 4.2 05/27/2018   HGB 12.2 05/27/2018   HCT 38.0 05/27/2018   MCV 96.4 05/27/2018   PLT 358 05/27/2018    Imaging:  [facility] Clinician Interpretation: I have personally reviewed the CNS images as listed.  My interpretation, in the context of the patient's clinical presentation, is new small enhancing lesion  Mr Jeri Cos Wo Contrast  Result Date: 04/21/2019 CLINICAL DATA:  Left temporal lobe lesion follow-up Biopsy 01/04/2018.  Pathology result: Brain, biopsy, Left temporal - DESTRUCTIVE MACROPHAGE-RICH LESION. SEE NOTE EXAM: MRI HEAD WITHOUT AND WITH CONTRAST TECHNIQUE: Multiplanar, multiecho pulse sequences of the brain and surrounding structures were obtained without and with intravenous contrast. CONTRAST:  13mL MULTIHANCE  GADOBENATE DIMEGLUMINE 529 MG/ML IV SOLN Creatinine was obtained on site at [facility] [place] at [address] Results: Creatinine 0.6 mg/dL. COMPARISON:  MRI head 09/06/2018 FINDINGS: Brain: Post biopsy surgical cavity in the left temporal lobe is stable. Mild surrounding FLAIR hyperintensity anterior to the cyst is stable. Mild enhancement of the wall of the left middle cerebral artery is unchanged from most recent MRI but improved from 06/10/2018 and markedly improved from the study of 12/31/2017. This does not have masslike features but tracks along the left M1 and M2 branches. New enhancing lesion right frontal white matter. Ill-defined patchy enhancement measuring approximately 5 mm was not present on prior studies. No other enhancing lesions identified. Leptomeningeal enhancement is normal. Ventricle size is normal. Small periventricular deep white matter hyperintensities stable. No acute infarct. Chronic microhemorrhage left thalamus not seen on the prior study possibly due to susceptibility weighting imaging on 3 Tesla on the current study. No other hemorrhage identified. Vascular: Normal arterial flow voids.  Enhancement along the left MCA as described above. Skull and upper cervical spine: Left pterional craniotomy. No acute skeletal abnormality. Sinuses/Orbits: Mild mucosal edema paranasal sinuses.  Normal orbit Other: None IMPRESSION: 1. Post surgical cavity left temporal lobe stable. Persistent and unchanged mild perivascular enhancement along the left M1 and M2 branches stable from the prior study but improved from Mar 10, 2018. 2. New area of enhancement in the right frontal white matter measuring approximately 5 x 7 mm without surrounding edema. 3. Differential includes inflammatory or infectious process. Given the vessel wall enhancing lesion in the left MCA, consider fungal infection, aspergillosis, Lyme disease. Neuro sarcoid possible however no findings in the chest. Lymphoma possible however was not identified on prior biopsy of left temporal lesion. Electronically Signed   By: Franchot Gallo M.D.   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 Schubring is clinically and radiographically stable today.  New lesion identified on brain MRI is small and asymptomatic at present.  Overall disease process over the past 15 months is consistent with neuro-inflammatory syndrome such as neuro-sarcoid, although two biopsies were non-diagnostic.     We recommended she remain off steroids, and return in 3 months with an MRI brain for review given progressive findings.    We appreciate the opportunity to participate in the care of [employer]   All questions were answered. The patient knows to call the clinic with any problems, questions or concerns. No barriers to learning were detected.  The total time spent in the encounter was 25 minutes and more than 50% was on counseling and review of test results   [name], MD Medical Director of Neuro-Oncology [facility] at [place] 04/27/19 3:43 PM

## 2019-04-27 NOTE — Telephone Encounter (Signed)
Scheduled appt per 6/25 los. Spoke with patient and she is aware of her appt date and time.

## 2019-05-01 ENCOUNTER — Inpatient Hospital Stay: Payer: Medicare Other

## 2019-05-22 DIAGNOSIS — H25811 Combined forms of age-related cataract, right eye: Secondary | ICD-10-CM | POA: Diagnosis not present

## 2019-05-22 DIAGNOSIS — H2512 Age-related nuclear cataract, left eye: Secondary | ICD-10-CM | POA: Diagnosis not present

## 2019-05-22 DIAGNOSIS — H5347 Heteronymous bilateral field defects: Secondary | ICD-10-CM | POA: Diagnosis not present

## 2019-05-22 DIAGNOSIS — H10413 Chronic giant papillary conjunctivitis, bilateral: Secondary | ICD-10-CM | POA: Diagnosis not present

## 2019-05-22 DIAGNOSIS — H353132 Nonexudative age-related macular degeneration, bilateral, intermediate dry stage: Secondary | ICD-10-CM | POA: Diagnosis not present

## 2019-05-30 ENCOUNTER — Other Ambulatory Visit: Payer: Self-pay | Admitting: *Deleted

## 2019-05-30 MED ORDER — LEVETIRACETAM 250 MG PO TABS: 250.0000 mg | ORAL_TABLET | Freq: Two times a day (BID) | ORAL | 5 refills | Status: DC

## 2019-07-12 ENCOUNTER — Other Ambulatory Visit: Payer: Self-pay | Admitting: *Deleted

## 2019-07-12 ENCOUNTER — Encounter: Payer: Self-pay | Admitting: Family Medicine

## 2019-07-12 DIAGNOSIS — I10 Essential (primary) hypertension: Secondary | ICD-10-CM

## 2019-07-13 ENCOUNTER — Other Ambulatory Visit: Payer: Self-pay | Admitting: *Deleted

## 2019-07-13 DIAGNOSIS — I10 Essential (primary) hypertension: Secondary | ICD-10-CM

## 2019-07-13 MED ORDER — DILTIAZEM HCL ER COATED BEADS 180 MG PO CP24: 180.0000 mg | ORAL_CAPSULE | Freq: Every day | ORAL | 0 refills | Status: DC

## 2019-07-25 ENCOUNTER — Other Ambulatory Visit: Payer: Self-pay | Admitting: Radiation Therapy

## 2019-07-28 ENCOUNTER — Other Ambulatory Visit: Payer: Self-pay

## 2019-07-28 ENCOUNTER — Ambulatory Visit (HOSPITAL_COMMUNITY)
Admission: RE | Admit: 2019-07-28 | Discharge: 2019-07-28 | Disposition: A | Discharge status: Home / Self Care | Payer: Medicare Other | Source: Ambulatory Visit | Attending: Internal Medicine | Admitting: Internal Medicine

## 2019-07-28 DIAGNOSIS — G936 Cerebral edema: Secondary | ICD-10-CM | POA: Diagnosis not present

## 2019-07-28 DIAGNOSIS — G968 Other specified disorders of central nervous system: Secondary | ICD-10-CM | POA: Diagnosis not present

## 2019-07-28 DIAGNOSIS — G9689 Other specified disorders of central nervous system: Secondary | ICD-10-CM

## 2019-07-28 LAB — POCT I-STAT CREATININE: Creatinine, Ser: 0.5 mg/dL (ref 0.44–1.00)

## 2019-07-28 MED ORDER — GADOBUTROL 1 MMOL/ML IV SOLN
9.0000 mL | Freq: Once | INTRAVENOUS | Status: AC | PRN
  Administered 2019-07-28: 9 mL via INTRAVENOUS

## 2019-07-31 ENCOUNTER — Telehealth: Payer: Self-pay | Admitting: Internal Medicine

## 2019-07-31 ENCOUNTER — Inpatient Hospital Stay: Payer: Medicare Other | Attending: Internal Medicine | Admitting: Internal Medicine

## 2019-07-31 ENCOUNTER — Other Ambulatory Visit: Payer: Self-pay

## 2019-07-31 VITALS — BP 162/86 | HR 70 | Temp 98.7°F | Resp 18 | Ht 66.0 in | Wt 204.3 lb

## 2019-07-31 DIAGNOSIS — Z79899 Other long term (current) drug therapy: Secondary | ICD-10-CM | POA: Diagnosis not present

## 2019-07-31 DIAGNOSIS — G9389 Other specified disorders of brain: Secondary | ICD-10-CM | POA: Diagnosis not present

## 2019-07-31 DIAGNOSIS — Z23 Encounter for immunization: Secondary | ICD-10-CM | POA: Diagnosis not present

## 2019-07-31 DIAGNOSIS — Z7982 Long term (current) use of aspirin: Secondary | ICD-10-CM | POA: Diagnosis not present

## 2019-07-31 DIAGNOSIS — Z87891 Personal history of nicotine dependence: Secondary | ICD-10-CM | POA: Insufficient documentation

## 2019-07-31 DIAGNOSIS — F419 Anxiety disorder, unspecified: Secondary | ICD-10-CM | POA: Diagnosis not present

## 2019-07-31 DIAGNOSIS — R51 Headache: Secondary | ICD-10-CM | POA: Insufficient documentation

## 2019-07-31 MED ORDER — DEXAMETHASONE 4 MG PO TABS: 4.0000 mg | ORAL_TABLET | Freq: Two times a day (BID) | ORAL | 0 refills | Status: DC

## 2019-07-31 MED ORDER — INFLUENZA VAC A&B SA ADJ QUAD 0.5 ML IM PRSY
0.5000 mL | PREFILLED_SYRINGE | Freq: Once | INTRAMUSCULAR | Status: AC
  Administered 2019-07-31: 0.5 mL via INTRAMUSCULAR

## 2019-07-31 MED ORDER — INFLUENZA VAC A&B SA ADJ QUAD 0.5 ML IM PRSY
PREFILLED_SYRINGE | INTRAMUSCULAR | Status: AC
  Filled 2019-07-31: qty 0.5

## 2019-07-31 NOTE — Telephone Encounter (Signed)
Scheduled appt per 9/28 los.  Spoke with patient and she is aware of the appt date and time.

## 2019-08-01 ENCOUNTER — Encounter: Payer: Self-pay | Admitting: Internal Medicine

## 2019-08-01 NOTE — Progress Notes (Signed)
[facility] at [facility], [place] [address] [phone]   Interval Evaluation  Date of Service: 08/01/19 Patient Name: Tara [name] Patient MRN: [id number] Patient DOB: 02-26-1951 Provider: [name], MD  Identifying Statement:  Tara [name] is a 68 y.o. female with left frontal brain lesion   Interval History:  Tara [name] presents today for follow up after recent MRI brain.  She describes no new or progressive neurologic symptoms aside from mild word finding/expression difficulty.  She continues to have occasional headaches which are mild/moderate, typically behind the left eye. Sleep issues have not improved.  Medications: Current Outpatient Medications on File Prior to Visit  Medication Sig Dispense Refill   aspirin EC 81 MG tablet Take 1 tablet (81 mg total) by mouth daily.     diltiazem (CARDIZEM CD) 180 MG 24 hr capsule Take 1 capsule (180 mg total) by mouth daily. 180 capsule 0   levETIRAcetam (KEPPRA) 250 MG tablet Take 1 tablet (250 mg total) by mouth 2 (two) times daily. 60 tablet 5   Multiple Vitamin (MULTIVITAMIN WITH MINERALS) TABS tablet Take 1 tablet by mouth daily.     Multiple Vitamins-Minerals (PRESERVISION AREDS PO) Take 2 capsules by mouth daily.     No current facility-administered medications on file prior to visit.     Allergies:  Allergies  Allergen Reactions   Ace Inhibitors Cough   Ether    Past Medical History: No past medical history on file. Past Surgical History:  Past Surgical History:  Procedure Laterality Date   APPLICATION OF CRANIAL NAVIGATION N/A 01/04/2018   Procedure: APPLICATION OF CRANIAL NAVIGATION;  Surgeon: Erline Levine, MD;  Location: [facility];  Service: Neurosurgery;  Laterality: N/A;   BRAIN SURGERY     PR DURAL GRAFT REPAIR,SPINE DEFECT Left 01/04/2018   Procedure: Left Pterional craniotomy for biopsy with Brainlab;  Surgeon: Erline Levine, MD;   Location: [facility];  Service: Neurosurgery;  Laterality: Left;  Left Pterional craniotomy for biopsy with Brainlab   Social History:  Social History   Socioeconomic History   Marital status: Married    Spouse name: Not on file   Number of children: Not on file   Years of education: Not on file   Highest education level: Not on file  Occupational History   Not on file  Social Needs   Financial resource strain: Not on file   Food insecurity    Worry: Not on file    Inability: Not on file   Transportation needs    Medical: Not on file    Non-medical: Not on file  Tobacco Use   Smoking status: Former Smoker    Packs/day: 1.00    Types: Cigarettes   Smokeless tobacco: Never Used   Tobacco comment: plans to quit now  Substance and Sexual Activity   Alcohol use: No    Frequency: Never   Drug use: No   Sexual activity: Yes  Lifestyle   Physical activity    Days per week: Not on file    Minutes per session: Not on file   Stress: Not on file  Relationships   Social connections    Talks on phone: Not on file    Gets together: Not on file    Attends religious service: Not on file    Active member of club or organization: Not on file    Attends meetings of clubs or organizations: Not on file  Relationship status: Not on file   Intimate partner violence    Fear of current or ex partner: Not on file    Emotionally abused: Not on file    Physically abused: Not on file    Forced sexual activity: Not on file  Other Topics Concern   Not on file  Social History Narrative   Not on file   Family History:  Family History  Problem Relation Age of Onset   Colon cancer Mother    CAD Father    Cancer Father    Diabetes Father    Heart disease Father    Hypertension Father    Diabetes Sister    Diabetes Brother     Review of Systems: Constitutional: Denies fevers, chills or abnormal weight loss Eyes: Denies blurriness of vision Ears, nose, mouth,  throat, and face: +hair loss Respiratory: Denies cough, dyspnea or wheezes Cardiovascular: Denies palpitation, chest discomfort or lower extremity swelling Gastrointestinal:  Denies nausea, constipation, diarrhea GU: Denies dysuria or incontinence Skin: Denies abnormal skin rashes Neurological: Per HPI Musculoskeletal: Denies joint pain, back or neck discomfort. No decrease in ROM Behavioral/Psych: +anxiety  Physical Exam: Vitals:   07/31/19 0906  BP: (!) 162/86  Pulse: 70  Resp: 18  Temp: 98.7 F (37.1 C)  SpO2: 98%   KPS: 90. General: Alert, cooperative, pleasant, in no acute distress Head: Normal EENT: No conjunctival injection or scleral icterus. Oral mucosa moist Lungs: Resp effort normal Cardiac: Regular rate and rhythm Abdomen: Soft, non-distended abdomen Skin: No rashes cyanosis or petechiae. Extremities: no edema  Neurologic Exam: Mental Status: Awake, alert, attentive to examiner. Oriented to self and environment. Language is fluent with intact comprehension.  Cranial Nerves: Visual acuity is grossly normal. Visual fields are full. Extra-ocular movements intact. No ptosis. Face is symmetric, tongue midline. Motor: Tone and bulk are normal. Power is full in both arms and legs. Reflexes are symmetric, no pathologic reflexes present. Intact finger to nose bilaterally Sensory: Intact to light touch and temperature Gait: Normal and tandem gait is normal.   Labs: I have reviewed the data as listed    Component Value Date/Time   NA 140 02/03/2018 1015   K 4.5 02/03/2018 1015   CL 103 02/03/2018 1015   CO2 24 02/03/2018 1015   GLUCOSE 80 02/03/2018 1015   GLUCOSE 118 (H) 01/10/2018 0738   BUN 20 02/03/2018 1015   CREATININE 0.50 07/28/2019 1014   CALCIUM 9.0 02/03/2018 1015   PROT 5.9 (L) 02/03/2018 1015   ALBUMIN 3.7 02/03/2018 1015   AST 15 02/03/2018 1015   ALT 48 (H) 02/03/2018 1015   ALKPHOS 88 02/03/2018 1015   BILITOT 0.4 02/03/2018 1015   GFRNONAA 97  02/03/2018 1015   GFRAA 112 02/03/2018 1015   Lab Results  Component Value Date   WBC 5.8 05/27/2018   NEUTROABS 4.2 05/27/2018   HGB 12.2 05/27/2018   HCT 38.0 05/27/2018   MCV 96.4 05/27/2018   PLT 358 05/27/2018    Imaging:  [facility] Clinician Interpretation: I have personally reviewed the CNS images as listed.  My interpretation, in the context of the patient's clinical presentation, is new small enhancing lesion  Mr Jeri Cos Wo Contrast  Result Date: 07/28/2019 CLINICAL DATA:  Abnormal MRI findings. Left temporal lesion post biopsy. Nondiagnostic biopsy. EXAM: MRI HEAD WITHOUT AND WITH CONTRAST TECHNIQUE: Multiplanar, multiecho pulse sequences of the brain and surrounding structures were obtained without and with intravenous contrast. CONTRAST:  76mL GADAVIST GADOBUTROL 1 MMOL/ML  IV SOLN COMPARISON:  Multiple prior studies most recently 04/21/2019 FINDINGS: Brain: New area of edema in the left frontal lobe. New area of enhancement in the left lateral frontal lobe extending to the dural surface. The area of enhancement measures approximately 13 x 13 mm with surrounding edema. This area appears normal on prior studies. 14 x 17 mm cyst left temporal lobe similar in size. Previously noted enhancement in the left sylvian fissure and skull base has resolved. Abnormal enhancement left medial temporal lobe has resolved. Postsurgical encephalomalacia left temporal lobe anteriorly. Ventricle size normal. Mild midline shift to the right. Negative for acute infarct. No intracranial hemorrhage. Vascular: Normal arterial enhancement. Skull and upper cervical spine: Left pterional craniotomy. No acute skeletal abnormality. Sinuses/Orbits: Negative Other: None IMPRESSION: New area of enhancement left frontal lobe measuring 13 mm with surrounding edema. The enhancement extends to the dural surface and underlies the craniotomy flap. Left temporal lobe cyst unchanged. Previously noted abnormal enhancement around the  left middle cerebral artery and in the temporal lobe has resolved. Electronically Signed   By: Franchot Gallo M.D.   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  New lesion identified on brain MRI is brightly enhancing and active, likely explaining clinical complaint today.  Although we still do not have formal diagnosis, we are confident underlying etiology is inflammatory rather than neoplastic, and have greatest suspicion for neurosarcoidosis.    We recommended she start short course of oral dexamethasone, 4mg  BID x5 days, then stop.  She has demonstrated good response to corticosteroids previously.  Given changes today we ask that she return in 2 months with an MRI brain for further review of progressive findings.    We appreciate the opportunity to participate in the care of [employer]   All questions were answered. The patient knows to call the clinic with any problems, questions or concerns. No barriers to learning were detected.  The total time spent in the encounter was 25 minutes and more than 50% was on counseling and review of test results   [name], MD Medical Director of Neuro-Oncology [facility] at [place] 08/01/19 1:37 PM

## 2019-08-02 ENCOUNTER — Inpatient Hospital Stay: Payer: Medicare Other

## 2019-08-04 ENCOUNTER — Other Ambulatory Visit: Payer: Self-pay | Admitting: Radiation Therapy

## 2019-08-07 ENCOUNTER — Other Ambulatory Visit: Payer: Self-pay | Admitting: Radiation Therapy

## 2019-09-08 ENCOUNTER — Other Ambulatory Visit: Payer: Self-pay

## 2019-09-08 ENCOUNTER — Ambulatory Visit (HOSPITAL_COMMUNITY)
Admission: RE | Admit: 2019-09-08 | Discharge: 2019-09-08 | Disposition: A | Discharge status: Home / Self Care | Payer: Medicare Other | Source: Ambulatory Visit | Attending: Internal Medicine | Admitting: Internal Medicine

## 2019-09-08 DIAGNOSIS — G9389 Other specified disorders of brain: Secondary | ICD-10-CM | POA: Insufficient documentation

## 2019-09-08 MED ORDER — GADOBUTROL 1 MMOL/ML IV SOLN
9.0000 mL | Freq: Once | INTRAVENOUS | Status: AC | PRN
  Administered 2019-09-08: 9 mL via INTRAVENOUS

## 2019-09-11 ENCOUNTER — Inpatient Hospital Stay: Payer: Medicare Other | Attending: Internal Medicine | Admitting: Internal Medicine

## 2019-09-11 ENCOUNTER — Inpatient Hospital Stay: Payer: Medicare Other

## 2019-09-11 ENCOUNTER — Other Ambulatory Visit: Payer: Self-pay

## 2019-09-11 VITALS — BP 148/91 | HR 73 | Temp 97.9°F | Resp 17 | Ht 66.0 in | Wt 206.3 lb

## 2019-09-11 DIAGNOSIS — Z87891 Personal history of nicotine dependence: Secondary | ICD-10-CM | POA: Diagnosis not present

## 2019-09-11 DIAGNOSIS — Z800 Family history of malignant neoplasm of digestive organs: Secondary | ICD-10-CM | POA: Diagnosis not present

## 2019-09-11 DIAGNOSIS — Z809 Family history of malignant neoplasm, unspecified: Secondary | ICD-10-CM | POA: Diagnosis not present

## 2019-09-11 DIAGNOSIS — G9389 Other specified disorders of brain: Secondary | ICD-10-CM | POA: Diagnosis present

## 2019-09-11 DIAGNOSIS — Z79899 Other long term (current) drug therapy: Secondary | ICD-10-CM | POA: Diagnosis not present

## 2019-09-11 DIAGNOSIS — G9689 Other specified disorders of central nervous system: Secondary | ICD-10-CM

## 2019-09-11 DIAGNOSIS — Z7982 Long term (current) use of aspirin: Secondary | ICD-10-CM | POA: Insufficient documentation

## 2019-09-11 DIAGNOSIS — Z833 Family history of diabetes mellitus: Secondary | ICD-10-CM | POA: Diagnosis not present

## 2019-09-11 DIAGNOSIS — Z8249 Family history of ischemic heart disease and other diseases of the circulatory system: Secondary | ICD-10-CM | POA: Insufficient documentation

## 2019-09-11 NOTE — Progress Notes (Signed)
[facility] at [place],  [address] [phone]   Interval Evaluation  Date of Service: 09/11/19 Patient Name: Tara [name] Patient MRN: [id number] Patient DOB: 1951-09-12 Provider: [name], MD  Identifying Statement:  Tara [name] is a 68 y.o. female with left frontal brain lesion   Interval History:  ROYE ROTMAN presents today for follow up after recent MRI brain.  She describes no new or progressive neurologic symptoms aside from mild word finding/expression difficulty.  No recent headaches. The decadron did not lead to any noticeable changes clinically during week of therapy.   Medications: Current Outpatient Medications on File Prior to Visit  Medication Sig Dispense Refill   aspirin EC 81 MG tablet Take 1 tablet (81 mg total) by mouth daily.     diltiazem (CARDIZEM CD) 180 MG 24 hr capsule Take 1 capsule (180 mg total) by mouth daily. 180 capsule 0   levETIRAcetam (KEPPRA) 250 MG tablet Take 1 tablet (250 mg total) by mouth 2 (two) times daily. 60 tablet 5   Multiple Vitamin (MULTIVITAMIN WITH MINERALS) TABS tablet Take 1 tablet by mouth daily.     Multiple Vitamins-Minerals (PRESERVISION AREDS PO) Take 2 capsules by mouth daily.     No current facility-administered medications on file prior to visit.     Allergies:  Allergies  Allergen Reactions   Ace Inhibitors Cough   Ether    Past Medical History: No past medical history on file. Past Surgical History:  Past Surgical History:  Procedure Laterality Date   APPLICATION OF CRANIAL NAVIGATION N/A 01/04/2018   Procedure: APPLICATION OF CRANIAL NAVIGATION;  Surgeon: Erline Levine, MD;  Location: [facility];  Service: Neurosurgery;  Laterality: N/A;   BRAIN SURGERY     PR DURAL GRAFT REPAIR,SPINE DEFECT Left 01/04/2018   Procedure: Left Pterional craniotomy for biopsy with Brainlab;  Surgeon: Erline Levine, MD;  Location: [facility];   Service: Neurosurgery;  Laterality: Left;  Left Pterional craniotomy for biopsy with Brainlab   Social History:  Social History   Socioeconomic History   Marital status: Married    Spouse name: Not on file   Number of children: Not on file   Years of education: Not on file   Highest education level: Not on file  Occupational History   Not on file  Social Needs   Financial resource strain: Not on file   Food insecurity    Worry: Not on file    Inability: Not on file   Transportation needs    Medical: Not on file    Non-medical: Not on file  Tobacco Use   Smoking status: Former Smoker    Packs/day: 1.00    Types: Cigarettes   Smokeless tobacco: Never Used   Tobacco comment: plans to quit now  Substance and Sexual Activity   Alcohol use: No    Frequency: Never   Drug use: No   Sexual activity: Yes  Lifestyle   Physical activity    Days per week: Not on file    Minutes per session: Not on file   Stress: Not on file  Relationships   Social connections    Talks on phone: Not on file    Gets together: Not on file    Attends religious service: Not on file    Active member of club or organization: Not on file    Attends meetings of clubs or organizations: Not on file  Relationship status: Not on file   Intimate partner violence    Fear of current or ex partner: Not on file    Emotionally abused: Not on file    Physically abused: Not on file    Forced sexual activity: Not on file  Other Topics Concern   Not on file  Social History Narrative   Not on file   Family History:  Family History  Problem Relation Age of Onset   Colon cancer Mother    CAD Father    Cancer Father    Diabetes Father    Heart disease Father    Hypertension Father    Diabetes Sister    Diabetes Brother     Review of Systems: Constitutional: Denies fevers, chills or abnormal weight loss Eyes: Denies blurriness of vision Ears, nose, mouth, throat, and face:  +hair loss Respiratory: Denies cough, dyspnea or wheezes Cardiovascular: Denies palpitation, chest discomfort or lower extremity swelling Gastrointestinal:  Denies nausea, constipation, diarrhea GU: Denies dysuria or incontinence Skin: Denies abnormal skin rashes Neurological: Per HPI Musculoskeletal: Denies joint pain, back or neck discomfort. No decrease in ROM Behavioral/Psych: +anxiety  Physical Exam: Vitals:   09/11/19 0920  BP: (!) 148/91  Pulse: 73  Resp: 17  Temp: 97.9 F (36.6 C)  SpO2: 99%   KPS: 90. General: Alert, cooperative, pleasant, in no acute distress Head: Normal EENT: No conjunctival injection or scleral icterus. Oral mucosa moist Lungs: Resp effort normal Cardiac: Regular rate and rhythm Abdomen: Soft, non-distended abdomen Skin: No rashes cyanosis or petechiae. Extremities: no edema  Neurologic Exam: Mental Status: Awake, alert, attentive to examiner. Oriented to self and environment. Language is fluent with intact comprehension.  Cranial Nerves: Visual acuity is grossly normal. Visual fields are full. Extra-ocular movements intact. No ptosis. Face is symmetric, tongue midline. Motor: Tone and bulk are normal. Power is full in both arms and legs. Reflexes are symmetric, no pathologic reflexes present. Intact finger to nose bilaterally Sensory: Intact to light touch and temperature Gait: Normal and tandem gait is deferred   Labs: I have reviewed the data as listed    Component Value Date/Time   NA 140 02/03/2018 1015   K 4.5 02/03/2018 1015   CL 103 02/03/2018 1015   CO2 24 02/03/2018 1015   GLUCOSE 80 02/03/2018 1015   GLUCOSE 118 (H) 01/10/2018 0738   BUN 20 02/03/2018 1015   CREATININE 0.50 07/28/2019 1014   CALCIUM 9.0 02/03/2018 1015   PROT 5.9 (L) 02/03/2018 1015   ALBUMIN 3.7 02/03/2018 1015   AST 15 02/03/2018 1015   ALT 48 (H) 02/03/2018 1015   ALKPHOS 88 02/03/2018 1015   BILITOT 0.4 02/03/2018 1015   GFRNONAA 97 02/03/2018 1015     GFRAA 112 02/03/2018 1015   Lab Results  Component Value Date   WBC 5.8 05/27/2018   NEUTROABS 4.2 05/27/2018   HGB 12.2 05/27/2018   HCT 38.0 05/27/2018   MCV 96.4 05/27/2018   PLT 358 05/27/2018    Imaging:  [facility] Clinician Interpretation: I have personally reviewed the CNS images as listed.  My interpretation, in the context of the patient's clinical presentation, is stable disease  Mr Jeri Cos Wo Contrast  Result Date: 09/08/2019 CLINICAL DATA:  Left temporal lesion.  Nondiagnostic biopsy. EXAM: MRI HEAD WITHOUT AND WITH CONTRAST TECHNIQUE: Multiplanar, multiecho pulse sequences of the brain and surrounding structures were obtained without and with intravenous contrast. CONTRAST:  59mL GADAVIST GADOBUTROL 1 MMOL/ML IV SOLN COMPARISON:  MRI  head 07/28/2019 FINDINGS: Brain: Left pterional craniotomy for biopsy in the left temporal lobe. Stable left temporal lobe cyst measuring 15 x 19 mm without abnormal enhancement. Mild encephalomalacia in the left temporal tip unchanged. Mild enhancement along the left middle cerebral artery unchanged from the recent study but markedly improved from earlier studies. Left frontal dural based enhancing lesion is slightly smaller now measuring 10.1 x 10.6 mm. This abuts the dural surface. The dura is thickened in this area. There is progressive white matter edema in the left frontal lobe compared with the prior study. Negative for acute infarct. No other enhancing lesions identified. Ventricle size is normal. Scattered small white matter hyperintensities stable. These are presumably related to chronic microvascular ischemia, demyelinating disease a less likely consideration. No intracranial hemorrhage. Vascular: Normal arterial flow voids. Skull and upper cervical spine: No focal skull lesion. Left pterional craniotomy Sinuses/Orbits: Mild mucosal edema paranasal sinuses. Negative orbit Other: None IMPRESSION: Left frontal cortical based enhancing lesion is  smaller compared to the recent study. The lesion touches the dural surface where there is mild thickening and enhancement of the left frontal dura. This is under the craniotomy flap. However, there is increased surrounding white matter edema. Left temporal encephalomalacia and cyst stable. Mild enhancement along the left middle cerebral artery is similar to the recent study but markedly improved from earlier studies. Electronically Signed   By: Franchot Gallo M.D.   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 Sorrento is clinically stable today.  New left frontal lesion identified on brain MRI 1 month ago is stable to regressed in terms of enhancing burden.  Accompanying T2 signal representing edema is modestly increased.   As prior, although we still do not have formal diagnosis, we are confident underlying etiology is inflammatory rather than neoplastic, and have greatest suspicion for neurosarcoidosis.    No further steroids at this time.  Given changes today we ask that she return in 3 months with an MRI brain for further review, or sooner if needed clinically.    We appreciate the opportunity to participate in the care of [employer]   All questions were answered. The patient knows to call the clinic with any problems, questions or concerns. No barriers to learning were detected.  The total time spent in the encounter was 25 minutes and more than 50% was on counseling and review of test results   [name], MD Medical Director of Neuro-Oncology [facility] at [place] 09/11/19 4:24 PM

## 2019-09-12 ENCOUNTER — Telehealth: Payer: Self-pay | Admitting: Internal Medicine

## 2019-09-12 NOTE — Telephone Encounter (Signed)
Scheduled appt per 11/9 los.  Spoke with pt and she is aware of the appt date and time

## 2019-10-18 ENCOUNTER — Encounter: Payer: Self-pay | Admitting: Internal Medicine

## 2019-11-29 ENCOUNTER — Other Ambulatory Visit: Payer: Self-pay | Admitting: Radiation Therapy

## 2019-12-01 ENCOUNTER — Ambulatory Visit: Payer: Medicare Other

## 2019-12-01 ENCOUNTER — Ambulatory Visit (INDEPENDENT_AMBULATORY_CARE_PROVIDER_SITE_OTHER): Payer: Medicare Other | Admitting: Family Medicine

## 2019-12-01 VITALS — BP 145/84 | HR 73 | Ht 66.0 in | Wt 206.0 lb

## 2019-12-01 DIAGNOSIS — Z0000 Encounter for general adult medical examination without abnormal findings: Secondary | ICD-10-CM

## 2019-12-01 NOTE — Patient Instructions (Signed)
Thank you for taking time to come for your Medicare Wellness Visit. I appreciate your ongoing commitment to your health goals. Please review the following plan we discussed and let me know if I can assist you in the future.  Delana Manganello LPN  Preventive Care 65 Years and Older, Female Preventive care refers to lifestyle choices and visits with your health care provider that can promote health and wellness. This includes:  A yearly physical exam. This is also called an annual well check.  Regular dental and eye exams.  Immunizations.  Screening for certain conditions.  Healthy lifestyle choices, such as diet and exercise. What can I expect for my preventive care visit? Physical exam Your health care provider will check:  Height and weight. These may be used to calculate body mass index (BMI), which is a measurement that tells if you are at a healthy weight.  Heart rate and blood pressure.  Your skin for abnormal spots. Counseling Your health care provider may ask you questions about:  Alcohol, tobacco, and drug use.  Emotional well-being.  Home and relationship well-being.  Sexual activity.  Eating habits.  History of falls.  Memory and ability to understand (cognition).  Work and work environment.  Pregnancy and menstrual history. What immunizations do I need?  Influenza (flu) vaccine  This is recommended every year. Tetanus, diphtheria, and pertussis (Tdap) vaccine  You may need a Td booster every 10 years. Varicella (chickenpox) vaccine  You may need this vaccine if you have not already been vaccinated. Zoster (shingles) vaccine  You may need this after age 60. Pneumococcal conjugate (PCV13) vaccine  One dose is recommended after age 65. Pneumococcal polysaccharide (PPSV23) vaccine  One dose is recommended after age 65. Measles, mumps, and rubella (MMR) vaccine  You may need at least one dose of MMR if you were born in 1957 or later. You may also  need a second dose. Meningococcal conjugate (MenACWY) vaccine  You may need this if you have certain conditions. Hepatitis A vaccine  You may need this if you have certain conditions or if you travel or work in places where you may be exposed to hepatitis A. Hepatitis B vaccine  You may need this if you have certain conditions or if you travel or work in places where you may be exposed to hepatitis B. Haemophilus influenzae type b (Hib) vaccine  You may need this if you have certain conditions. You may receive vaccines as individual doses or as more than one vaccine together in one shot (combination vaccines). Talk with your health care provider about the risks and benefits of combination vaccines. What tests do I need? Blood tests  Lipid and cholesterol levels. These may be checked every 5 years, or more frequently depending on your overall health.  Hepatitis C test.  Hepatitis B test. Screening  Lung cancer screening. You may have this screening every year starting at age 55 if you have a 30-pack-year history of smoking and currently smoke or have quit within the past 15 years.  Colorectal cancer screening. All adults should have this screening starting at age 50 and continuing until age 75. Your health care provider may recommend screening at age 45 if you are at increased risk. You will have tests every 1-10 years, depending on your results and the type of screening test.  Diabetes screening. This is done by checking your blood sugar (glucose) after you have not eaten for a while (fasting). You may have this done every 1-3   years.  Mammogram. This may be done every 1-2 years. Talk with your health care provider about how often you should have regular mammograms.  BRCA-related cancer screening. This may be done if you have a family history of breast, ovarian, tubal, or peritoneal cancers. Other tests  Sexually transmitted disease (STD) testing.  Bone density scan. This is done  to screen for osteoporosis. You may have this done starting at age 94. Follow these instructions at home: Eating and drinking  Eat a diet that includes fresh fruits and vegetables, whole grains, lean protein, and low-fat dairy products. Limit your intake of foods with high amounts of sugar, saturated fats, and salt.  Take vitamin and mineral supplements as recommended by your health care provider.  Do not drink alcohol if your health care provider tells you not to drink.  If you drink alcohol: ? Limit how much you have to 0-1 drink a day. ? Be aware of how much alcohol is in your drink. In the [place] one drink equals one 12 oz bottle of beer (355 mL), one 5 oz glass of wine (148 mL), or one 1 oz glass of hard liquor (44 mL). Lifestyle  Take daily care of your teeth and gums.  Stay active. Exercise for at least 30 minutes on 5 or more days each week.  Do not use any products that contain nicotine or tobacco, such as cigarettes, e-cigarettes, and chewing tobacco. If you need help quitting, ask your health care provider.  If you are sexually active, practice safe sex. Use a condom or other form of protection in order to prevent STIs (sexually transmitted infections).  Talk with your health care provider about taking a low-dose aspirin or statin. What's next?  Go to your health care provider once a year for a well check visit.  Ask your health care provider how often you should have your eyes and teeth checked.  Stay up to date on all vaccines. This information is not intended to replace advice given to you by your health care provider. Make sure you discuss any questions you have with your health care provider. Document Revised: 10/13/2018 Document Reviewed: 10/13/2018 Elsevier Patient Education  2020 [employer].

## 2019-12-01 NOTE — Progress Notes (Signed)
Presents today for [employer] Visit   Date of last exam: 09/20/2018  Interpreter used for this visit?   I connected with  [name] on 12/01/19 by a telephone  and verified that I am speaking with the correct person using two identifiers.   I discussed the limitations of evaluation and management by telemedicine. The patient expressed understanding and agreed to proceed.    Patient Care Team: Forrest Moron, MD as PCP - General (Internal Medicine)   Other items to address today:   Discussed immunization Will discuss immuniztions at upcoming appointment 2/2 Dr. [name]  Patient is having 2nd does Pzier vaccine on 12-17-2018 Discussed Eye/ Dental   Other Screening: Last screening for diabetes: 01/01/2018 Last lipid screening:02/03/2018  ADVANCE DIRECTIVES: Discussed: yes On File: no  Materials Provided: no   Immunization status:  Immunization History  Administered Date(s) Administered  . Fluad Quad(high Dose 65+) 07/31/2019  . Influenza, High Dose Seasonal PF 01/04/2018, 08/09/2018  . Pneumococcal Polysaccharide-23 01/04/2018  . Tdap 03/14/2018     Health Maintenance Due  Topic Date Due  . MAMMOGRAM  04/23/2001  . DEXA SCAN  04/23/2016  . PNA vac Low Risk Adult (2 of 2 - PCV13) 01/05/2019     Functional Status Survey: Is the patient deaf or have difficulty hearing?: No Does the patient have difficulty seeing, even when wearing glasses/contacts?: No Does the patient have difficulty concentrating, remembering, or making decisions?: No Does the patient have difficulty walking or climbing stairs?: No Does the patient have difficulty dressing or bathing?: No Does the patient have difficulty doing errands alone such as visiting a doctor's office or shopping?: No   6CIT Screen 12/01/2019  What Year? 0 points  What month? 0 points  What time? 0 points  Count back from 20 0 points  Months in reverse 0 points  Repeat phrase 0 points   Total Score 0        Clinical Support from 12/01/2019 in [facility]  AUDIT-C Score  0       Home Environment:   Lives in two story home split level  No trouble climbing stairs Is careful her foot sometimes doesn't lift all way since surgery No scattered rugs Adequate lighting/no clutter Yes grab bars   Timed warm up N/A      Patient Active Problem List   Diagnosis Date Noted  . 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 Stroke ([place]) 01/21/2018  . Reactive hypertension   . Hypoalbuminemia due to protein-calorie malnutrition ([place])   . Seizures ([place])   . Elevated BUN   . Brain mass 01/06/2018  . Aphasia   . Abnormal CT of the head   . CNS mass 01/01/2018  . Acute metabolic encephalopathy 123456  . Headache 12/31/2017  . Hypertension 12/31/2017  . Hyperglycemia 12/31/2017  . Confusion 12/31/2017     History reviewed. No pertinent past medical history.   Past Surgical History:  Procedure Laterality Date  . APPLICATION OF CRANIAL NAVIGATION N/A 01/04/2018   Procedure: APPLICATION OF CRANIAL NAVIGATION;  Surgeon: Erline Levine, MD;  Location: [facility];  Service: Neurosurgery;  Laterality: N/A;  . BRAIN SURGERY    . PR DURAL GRAFT REPAIR,SPINE DEFECT Left 01/04/2018   Procedure: Left Pterional craniotomy for biopsy with Brainlab;  Surgeon: Erline Levine, MD;  Location: [place];  Service: Neurosurgery;  Laterality: Left;  Left  Pterional craniotomy for biopsy with Brainlab     Family History  Problem Relation Age of Onset  . Colon cancer Mother   . CAD Father   . Cancer Father   . Diabetes Father   . Heart disease Father   . Hypertension Father   . Diabetes Sister   . Diabetes Brother      Social History   Socioeconomic History  . Marital status: Married    Spouse name: Not on file  . Number of children: Not on file  . Years of education: Not on file    . Highest education level: Not on file  Occupational History  . Not on file  Tobacco Use  . Smoking status: Former Smoker    Packs/day: 1.00    Types: Cigarettes  . Smokeless tobacco: Never Used  . Tobacco comment: plans to quit now  Substance and Sexual Activity  . Alcohol use: No  . Drug use: No  . Sexual activity: Yes  Other Topics Concern  . Not on file  Social History Narrative  . Not on file   Social Determinants of Health   Financial Resource Strain:   . Difficulty of Paying Living Expenses: Not on file  Food Insecurity:   . Worried About Charity fundraiser in the Last Year: Not on file  . Ran Out of Food in the Last Year: Not on file  Transportation Needs:   . Lack of Transportation (Medical): Not on file  . Lack of Transportation (Non-Medical): Not on file  Physical Activity:   . Days of Exercise per Week: Not on file  . Minutes of Exercise per Session: Not on file  Stress:   . Feeling of Stress : Not on file  Social Connections:   . Frequency of Communication with Friends and Family: Not on file  . Frequency of Social Gatherings with Friends and Family: Not on file  . Attends Religious Services: Not on file  . Active Member of Clubs or Organizations: Not on file  . Attends Archivist Meetings: Not on file  . Marital Status: Not on file  Intimate Partner Violence:   . Fear of Current or Ex-Partner: Not on file  . Emotionally Abused: Not on file  . Physically Abused: Not on file  . Sexually Abused: Not on file     Allergies  Allergen Reactions  . Ace Inhibitors Cough  . Ether      Prior to Admission medications   Medication Sig Start Date End Date Taking? Authorizing Provider  aspirin EC 81 MG tablet Take 1 tablet (81 mg total) by mouth daily. 01/21/18  Yes Vaslow, Acey Lav, MD  diltiazem (CARDIZEM CD) 180 MG 24 hr capsule Take 1 capsule (180 mg total) by mouth daily. 07/13/19  Yes Forrest Moron, MD  levETIRAcetam (KEPPRA) 250 MG  tablet Take 1 tablet (250 mg total) by mouth 2 (two) times daily. 05/30/19  Yes Vaslow, Acey Lav, MD  Multiple Vitamin (MULTIVITAMIN WITH MINERALS) TABS tablet Take 1 tablet by mouth daily.   Yes [provider]  Multiple Vitamins-Minerals (PRESERVISION AREDS PO) Take 2 capsules by mouth daily.   Yes [provider]     Depression screen [facility] 2/9 12/01/2019 01/14/2019 09/12/2018 08/09/2018 03/14/2018  Decreased Interest 0 0 0 0 0  Down, Depressed, Hopeless 0 0 0 0 0  PHQ - 2 Score 0 0 0 0 0     Fall Risk  12/01/2019 01/14/2019 09/12/2018 08/09/2018 03/14/2018  Falls in the past year? 1 1 0 No No  Number falls in past yr: 1 0 - - -  Comment walking trip on uneven tile.   hurt arm - - - -  Injury with Fall? - 1 - - -  Follow up Falls evaluation completed;Education provided Falls evaluation completed - - -      PHYSICAL EXAM: BP (!) 145/84 Comment: per patient  Pulse 73 Comment: per patient  Ht 5\' 6"  (1.676 m)   Wt 206 lb (93.4 kg)   BMI 33.25 kg/m    Wt Readings from Last 3 Encounters:  12/01/19 206 lb (93.4 kg)  09/11/19 206 lb 4.8 oz (93.6 kg)  07/31/19 204 lb 4.8 oz (92.7 kg)      Education/Counseling provided regarding diet and exercise, prevention of chronic diseases, smoking/tobacco cessation, if applicable, and reviewed "Covered Medicare Preventive Services."

## 2019-12-05 ENCOUNTER — Ambulatory Visit (INDEPENDENT_AMBULATORY_CARE_PROVIDER_SITE_OTHER): Payer: Medicare Other | Admitting: Family Medicine

## 2019-12-05 ENCOUNTER — Encounter: Payer: Self-pay | Admitting: Family Medicine

## 2019-12-05 ENCOUNTER — Other Ambulatory Visit: Payer: Self-pay

## 2019-12-05 VITALS — BP 146/80 | HR 75 | Temp 98.3°F | Wt 210.0 lb

## 2019-12-05 DIAGNOSIS — E7800 Pure hypercholesterolemia, unspecified: Secondary | ICD-10-CM

## 2019-12-05 DIAGNOSIS — I10 Essential (primary) hypertension: Secondary | ICD-10-CM | POA: Diagnosis not present

## 2019-12-05 DIAGNOSIS — F4321 Adjustment disorder with depressed mood: Secondary | ICD-10-CM

## 2019-12-05 DIAGNOSIS — G9689 Other specified disorders of central nervous system: Secondary | ICD-10-CM

## 2019-12-05 MED ORDER — DILTIAZEM HCL ER COATED BEADS 180 MG PO CP24: 180.0000 mg | ORAL_CAPSULE | Freq: Every day | ORAL | 1 refills | Status: DC

## 2019-12-05 NOTE — Progress Notes (Signed)
Established Patient Office Visit  Subjective:  Patient ID: Tara [name], female    DOB: 06/12/1951  Age: 69 y.o. MRN: [id number]  CC:  Chief Complaint  Patient presents with  . Hypertension    . Patient also would like to talk about the shingles shot and the other pnuemonia shot. she wants to know what is the difference in the two shots. it stime for the prevnar13 but just received the covid vaccine and have to wait 2 weeks between.    HPI Tara [name] presents for   Patient is a 69yo F with left frontal cortical based enhancing lesion that is being followed at [facility] last visit 11.9.20  She also has hypertension. She gets decadron. She takes Diltiazem daily for hypertension.  She states that she has alarm to remind her to take her bp meds. She reports chronic headaches with headaches every few days.   BP Readings from Last 3 Encounters:  12/05/19 (!) 146/80  12/01/19 (!) 145/84  09/11/19 (!) 148/91   IMPRESSION: Left frontal cortical based enhancing lesion is smaller compared to the recent study. The lesion touches the dural surface where there is mild thickening and enhancement of the left frontal dura. This is under the craniotomy flap. However, there is increased surrounding white matter edema. Left temporal encephalomalacia and cyst stable. Mild enhancement along the left middle cerebral artery is similar to the recent study but markedly improved from earlier studies. Electronically Signed   By: Franchot Gallo M.D.   On: 09/08/2019 14:31    Emotional stress She reports that her daughter had twin babies that were born one sill born and one fetal demise at 89 weeks.  She states that she felt such sadness for her daughter.  Otherwise she feels fine. This happened January 1st. Depression screen [facility] 2/9 12/05/2019 12/01/2019 01/14/2019 09/12/2018 08/09/2018  Decreased Interest 0 0 0 0 0  Down, Depressed, Hopeless 0 0 0 0 0  PHQ - 2 Score 0 0 0 0 0      No  past medical history on file.  Past Surgical History:  Procedure Laterality Date  . APPLICATION OF CRANIAL NAVIGATION N/A 01/04/2018   Procedure: APPLICATION OF CRANIAL NAVIGATION;  Surgeon: Erline Levine, MD;  Location: [place];  Service: Neurosurgery;  Laterality: N/A;  . BRAIN SURGERY    . PR DURAL GRAFT REPAIR,SPINE DEFECT Left 01/04/2018   Procedure: Left Pterional craniotomy for biopsy with Brainlab;  Surgeon: Erline Levine, MD;  Location: [place] [facility];  Service: Neurosurgery;  Laterality: Left;  Left Pterional craniotomy for biopsy with Brainlab    Family History  Problem Relation Age of Onset  . Colon cancer Mother   . CAD Father   . Cancer Father   . Diabetes Father   . Heart disease Father   . Hypertension Father   . Diabetes Sister   . Diabetes Brother     Social History   Socioeconomic History  . Marital status: Married    Spouse name: Not on file  . Number of children: Not on file  . Years of education: Not on file  . Highest education level: Not on file  Occupational History  . Not on file  Tobacco Use  . Smoking status: Former Smoker    Packs/day: 1.00    Types: Cigarettes  . Smokeless tobacco: Never Used  . Tobacco comment: plans to quit now  Substance and Sexual Activity  . Alcohol use: No  . Drug use:  No  . Sexual activity: Yes  Other Topics Concern  . Not on file  Social History Narrative  . Not on file   Social Determinants of Health   Financial Resource Strain:   . Difficulty of Paying Living Expenses: Not on file  Food Insecurity:   . Worried About Charity fundraiser in the Last Year: Not on file  . Ran Out of Food in the Last Year: Not on file  Transportation Needs:   . Lack of Transportation (Medical): Not on file  . Lack of Transportation (Non-Medical): Not on file  Physical Activity:   . Days of Exercise per Week: Not on file  . Minutes of Exercise per Session: Not on file  Stress:   . Feeling of Stress : Not on file  Social Connections:     . Frequency of Communication with Friends and Family: Not on file  . Frequency of Social Gatherings with Friends and Family: Not on file  . Attends Religious Services: Not on file  . Active Member of Clubs or Organizations: Not on file  . Attends Archivist Meetings: Not on file  . Marital Status: Not on file  Intimate Partner Violence:   . Fear of Current or Ex-Partner: Not on file  . Emotionally Abused: Not on file  . Physically Abused: Not on file  . Sexually Abused: Not on file    Outpatient Medications Prior to Visit  Medication Sig Dispense Refill  . aspirin EC 81 MG tablet Take 1 tablet (81 mg total) by mouth daily.    Marland Kitchen levETIRAcetam (KEPPRA) 250 MG tablet Take 1 tablet (250 mg total) by mouth 2 (two) times daily. 60 tablet 5  . Multiple Vitamin (MULTIVITAMIN WITH MINERALS) TABS tablet Take 1 tablet by mouth daily.    . Multiple Vitamins-Minerals (PRESERVISION AREDS PO) Take 2 capsules by mouth daily.    Marland Kitchen diltiazem (CARDIZEM CD) 180 MG 24 hr capsule Take 1 capsule (180 mg total) by mouth daily. 180 capsule 0   No facility-administered medications prior to visit.    Allergies  Allergen Reactions  . Ace Inhibitors Cough  . Ether     ROS Review of Systems Review of Systems  Constitutional: Negative for activity change, appetite change, chills and fever.  HENT: Negative for congestion, nosebleeds, trouble swallowing and voice change.   Respiratory: Negative for cough, shortness of breath and wheezing.   Gastrointestinal: Negative for diarrhea, nausea and vomiting.  Genitourinary: Negative for difficulty urinating, dysuria, flank pain and hematuria.  Musculoskeletal: Negative for back pain, joint swelling and neck pain.  Neurological: Negative for dizziness, speech difficulty, light-headedness and numbness.  See HPI. All other review of systems negative.     Objective:    Physical Exam  BP (!) 146/80 (BP Location: Left Arm)   Pulse 75   Temp 98.3 F  (36.8 C) (Temporal)   Wt 210 lb (95.3 kg)   SpO2 97%   BMI 33.89 kg/m  Wt Readings from Last 3 Encounters:  12/05/19 210 lb (95.3 kg)  12/01/19 206 lb (93.4 kg)  09/11/19 206 lb 4.8 oz (93.6 kg)   Physical Exam  Constitutional: Oriented to person, place, and time. Appears well-developed and well-nourished.  HENT:  Head: Normocephalic and atraumatic.  Eyes: Conjunctivae and EOM are normal.  Cardiovascular: Normal rate, regular rhythm, normal heart sounds and intact distal pulses.  No murmur heard. Pulmonary/Chest: Effort normal and breath sounds normal. No stridor. No respiratory distress. Has no wheezes.  Neurological: Is alert and oriented to person, place, and time.  Skin: Skin is warm. Capillary refill takes less than 2 seconds.  Psychiatric: Has a normal mood and affect. Behavior is normal. Judgment and thought content normal.    Health Maintenance Due  Topic Date Due  . MAMMOGRAM  04/23/2001  . DEXA SCAN  04/23/2016  . PNA vac Low Risk Adult (2 of 2 - PCV13) 01/05/2019    There are no preventive care reminders to display for this patient.  No results found for: TSH Lab Results  Component Value Date   WBC 5.8 05/27/2018   HGB 12.2 05/27/2018   HCT 38.0 05/27/2018   MCV 96.4 05/27/2018   PLT 358 05/27/2018   Lab Results  Component Value Date   NA 140 02/03/2018   K 4.5 02/03/2018   CO2 24 02/03/2018   GLUCOSE 80 02/03/2018   BUN 20 02/03/2018   CREATININE 0.50 07/28/2019   BILITOT 0.4 02/03/2018   ALKPHOS 88 02/03/2018   AST 15 02/03/2018   ALT 48 (H) 02/03/2018   PROT 5.9 (L) 02/03/2018   ALBUMIN 3.7 02/03/2018   CALCIUM 9.0 02/03/2018   ANIONGAP 9 01/10/2018   Lab Results  Component Value Date   CHOL 229 (H) 02/03/2018   Lab Results  Component Value Date   HDL 95 02/03/2018   Lab Results  Component Value Date   LDLCALC 121 (H) 02/03/2018   Lab Results  Component Value Date   TRIG 63 02/03/2018   Lab Results  Component Value Date    CHOLHDL 2.4 02/03/2018   Lab Results  Component Value Date   HGBA1C 5.9 (H) 01/01/2018      Assessment & Plan:   Problem List Items Addressed This Visit      Cardiovascular and Mediastinum   Hypertension - Primary   Relevant Orders   Comprehensive metabolic panel     Nervous and Auditory   CNS mass    Other Visit Diagnoses    Pure hypercholesterolemia       Relevant Orders   Comprehensive metabolic panel   Lipid Panel   Grief reaction         Essential Hypertension -  Continue bp meds, continue DASH diet, grief reaction and stress likely making things worse Pure Hypercholesterolemia -  Will continue to monitor, HDL is good CNS Mass -  Continue with [place] recs Grief reaction - discussed grief support, referral placed to [facility] [place] ordered this encounter  Medications  . diltiazem (CARDIZEM CD) 180 MG 24 hr capsule    Sig: Take 1 capsule (180 mg total) by mouth daily.    Dispense:  180 capsule    Refill:  1    Follow-up: Return in about 6 months (around 06/03/2020) for blood pressure.   A total of 50 minutes were spent face-to-face with the patient during this encounter and over half of that time was spent on counseling and coordination of care.  Allowed patient to cry, vent, discuss the science and discussed ways to support her grieving daughter.   Forrest Moron, MD

## 2019-12-05 NOTE — Patient Instructions (Addendum)
If you have lab work done today you will be contacted with your lab results within the next 2 weeks.  If you have not heard from [place] then please contact us. The fastest way to get your results is to register for My Chart.   IF you received an x-ray today, you will receive an invoice from [facility]. Please contact [facility] at [phone] with questions or concerns regarding your invoice.   IF you received labwork today, you will receive an invoice from [facility]. Please contact [facility] at [phone] with questions or concerns regarding your invoice.   Our billing staff will not be able to assist you with questions regarding bills from these companies.  You will be contacted with the lab results as soon as they are available. The fastest way to get your results is to activate your My Chart account. Instructions are located on the last page of this paperwork. If you have not heard from [place] regarding the results in 2 weeks, please contact this office.      Managing Loss, Adult People experience loss in many different ways throughout their lives. Events such as moving, changing jobs, and losing friends can create a sense of loss. The loss may be as serious as a major health change, divorce, death of a pet, or death of a loved one. All of these types of loss are likely to create a physical and emotional reaction known as grief. Grief is the result of a major change or an absence of something or someone that you count on. Grief is a normal reaction to loss. A variety of factors can affect your grieving experience, including:  The nature of your loss.  Your relationship to what or whom you lost.  Your understanding of grief and how to manage it.  Your support system. How to manage lifestyle changes Keep to your normal routine as much as possible.  If you have trouble focusing or doing normal activities, it is acceptable to take some time away from your normal  routine.  Spend time with friends and loved ones.  Eat a healthy diet, get plenty of sleep, and rest when you feel tired. How to recognize changes  The way that you deal with your grief will affect your ability to function as you normally do. When grieving, you may experience these changes:  Numbness, shock, sadness, anxiety, anger, denial, and guilt.  Thoughts about death.  Unexpected crying.  A physical sensation of emptiness in your stomach.  Problems sleeping and eating.  Tiredness (fatigue).  Loss of interest in normal activities.  Dreaming about or imagining seeing the person who died.  A need to remember what or whom you lost.  Difficulty thinking about anything other than your loss for a period of time.  Relief. If you have been expecting the loss for a while, you may feel a sense of relief when it happens. Follow these instructions at home:  Activity Express your feelings in healthy ways, such as:  Talking with others about your loss. It may be helpful to find others who have had a similar loss, such as a support group.  Writing down your feelings in a journal.  Doing physical activities to release stress and emotional energy.  Doing creative activities like painting, sculpting, or playing or listening to music.  Practicing resilience. This is the ability to recover and adjust after facing challenges. Reading some resources that encourage resilience may help you to learn ways to  practice those behaviors. General instructions  Be patient with yourself and others. Allow the grieving process to happen, and remember that grieving takes time. ? It is likely that you may never feel completely done with some grief. You may find a way to move on while still cherishing memories and feelings about your loss. ? Accepting your loss is a process. It can take months or longer to adjust.  Keep all follow-up visits as told by your health care provider. This is  important. Where to find support To get support for managing loss:  Ask your health care provider for help and recommendations, such as grief counseling or therapy.  Think about joining a support group for people who are managing a loss. Where to find more information You can find more information about managing loss from:  American Society of Clinical Oncology: www.cancer.net  American Psychological Association: TVStereos.ch Contact a health care provider if:  Your grief is extreme and keeps getting worse.  You have ongoing grief that does not improve.  Your body shows symptoms of grief, such as illness.  You feel depressed, anxious, or lonely. Get help right away if:  You have thoughts about hurting yourself or others. If you ever feel like you may hurt yourself or others, or have thoughts about taking your own life, get help right away. You can go to your nearest emergency department or call:  Your local emergency services (911 in the [place]  A suicide crisis helpline, such as the [facility] at (432) 869-7227. This is open 24 hours a day. Summary  Grief is the result of a major change or an absence of someone or something that you count on. Grief is a normal reaction to loss.  The depth of grief and the period of recovery depend on the type of loss and your ability to adjust to the change and process your feelings.  Processing grief requires patience and a willingness to accept your feelings and talk about your loss with people who are supportive.  It is important to find resources that work for you and to realize that people experience grief differently. There is not one grieving process that works for everyone in the same way.  Be aware that when grief becomes extreme, it can lead to more severe issues like isolation, depression, anxiety, or suicidal thoughts. Talk with your health care provider if you have any of these issues. This information  is not intended to replace advice given to you by your health care provider. Make sure you discuss any questions you have with your health care provider. Document Revised: 12/23/2018 Document Reviewed: 03/04/2017 Elsevier Patient Education  [place].

## 2019-12-06 LAB — COMPREHENSIVE METABOLIC PANEL
ALT: 16 IU/L (ref 0–32)
AST: 14 IU/L (ref 0–40)
Albumin/Globulin Ratio: 1.8 (ref 1.2–2.2)
Albumin: 4.6 g/dL (ref 3.8–4.8)
Alkaline Phosphatase: 167 IU/L — ABNORMAL HIGH (ref 39–117)
BUN/Creatinine Ratio: 32 — ABNORMAL HIGH (ref 12–28)
BUN: 21 mg/dL (ref 8–27)
Bilirubin Total: 0.3 mg/dL (ref 0.0–1.2)
CO2: 23 mmol/L (ref 20–29)
Calcium: 9.4 mg/dL (ref 8.7–10.3)
Chloride: 103 mmol/L (ref 96–106)
Creatinine, Ser: 0.65 mg/dL (ref 0.57–1.00)
GFR calc Af Amer: 105 mL/min/{1.73_m2} (ref >59)
GFR calc non Af Amer: 92 mL/min/{1.73_m2} (ref >59)
Globulin, Total: 2.5 g/dL (ref 1.5–4.5)
Glucose: 99 mg/dL (ref 65–99)
Potassium: 4.4 mmol/L (ref 3.5–5.2)
Sodium: 142 mmol/L (ref 134–144)
Total Protein: 7.1 g/dL (ref 6.0–8.5)

## 2019-12-06 LAB — LIPID PANEL
Chol/HDL Ratio: 3.2 ratio (ref 0.0–4.4)
Cholesterol, Total: 234 mg/dL — ABNORMAL HIGH (ref 100–199)
HDL: 73 mg/dL (ref >39)
LDL Chol Calc (NIH): 144 mg/dL — ABNORMAL HIGH (ref 0–99)
Triglycerides: 99 mg/dL (ref 0–149)
VLDL Cholesterol Cal: 17 mg/dL (ref 5–40)

## 2019-12-09 ENCOUNTER — Ambulatory Visit: Payer: Medicare Other

## 2019-12-15 ENCOUNTER — Other Ambulatory Visit: Payer: Self-pay

## 2019-12-15 ENCOUNTER — Ambulatory Visit (HOSPITAL_COMMUNITY)
Admission: RE | Admit: 2019-12-15 | Discharge: 2019-12-15 | Disposition: A | Discharge status: Home / Self Care | Payer: Medicare Other | Source: Ambulatory Visit | Attending: Internal Medicine | Admitting: Internal Medicine

## 2019-12-15 DIAGNOSIS — G939 Disorder of brain, unspecified: Secondary | ICD-10-CM | POA: Diagnosis not present

## 2019-12-15 DIAGNOSIS — G9689 Other specified disorders of central nervous system: Secondary | ICD-10-CM | POA: Insufficient documentation

## 2019-12-15 MED ORDER — GADOBUTROL 1 MMOL/ML IV SOLN
9.0000 mL | Freq: Once | INTRAVENOUS | Status: AC | PRN
  Administered 2019-12-15: 07:00:00 9 mL via INTRAVENOUS

## 2019-12-18 ENCOUNTER — Inpatient Hospital Stay: Payer: Medicare Other

## 2019-12-18 ENCOUNTER — Inpatient Hospital Stay: Payer: Medicare Other | Attending: Internal Medicine | Admitting: Internal Medicine

## 2019-12-18 ENCOUNTER — Other Ambulatory Visit: Payer: Self-pay

## 2019-12-18 VITALS — BP 134/81 | HR 77 | Temp 97.8°F | Resp 18 | Ht 66.0 in | Wt 216.0 lb

## 2019-12-18 DIAGNOSIS — Z833 Family history of diabetes mellitus: Secondary | ICD-10-CM | POA: Insufficient documentation

## 2019-12-18 DIAGNOSIS — Z79899 Other long term (current) drug therapy: Secondary | ICD-10-CM | POA: Diagnosis not present

## 2019-12-18 DIAGNOSIS — Z87891 Personal history of nicotine dependence: Secondary | ICD-10-CM | POA: Diagnosis not present

## 2019-12-18 DIAGNOSIS — Z7982 Long term (current) use of aspirin: Secondary | ICD-10-CM | POA: Insufficient documentation

## 2019-12-18 DIAGNOSIS — Z800 Family history of malignant neoplasm of digestive organs: Secondary | ICD-10-CM | POA: Insufficient documentation

## 2019-12-18 DIAGNOSIS — G9389 Other specified disorders of brain: Secondary | ICD-10-CM | POA: Diagnosis not present

## 2019-12-18 DIAGNOSIS — Z8249 Family history of ischemic heart disease and other diseases of the circulatory system: Secondary | ICD-10-CM | POA: Insufficient documentation

## 2019-12-18 DIAGNOSIS — G9689 Other specified disorders of central nervous system: Secondary | ICD-10-CM | POA: Diagnosis not present

## 2019-12-18 NOTE — Progress Notes (Signed)
[facility] at [place], [place] [address] [phone]   Interval Evaluation  Date of Service: 12/18/19 Patient Name: Tara [name] Patient MRN: [id number] Patient DOB: 11-09-1950 Provider: [name], MD  Identifying Statement:  Tara [name] is a 69 y.o. female with left frontal brain lesion   Interval History:  Tara [name] presents today for follow up after recent MRI brain.  She describes no new or progressive neurologic symptoms aside from mild word finding/expression difficulty.  No recent headaches.   Medications: Current Outpatient Medications on File Prior to Visit  Medication Sig Dispense Refill  . aspirin EC 81 MG tablet Take 1 tablet (81 mg total) by mouth daily.    Marland Kitchen diltiazem (CARDIZEM CD) 180 MG 24 hr capsule Take 1 capsule (180 mg total) by mouth daily. 180 capsule 1  . levETIRAcetam (KEPPRA) 250 MG tablet Take 1 tablet (250 mg total) by mouth 2 (two) times daily. 60 tablet 5  . Multiple Vitamin (MULTIVITAMIN WITH MINERALS) TABS tablet Take 1 tablet by mouth daily.    . Multiple Vitamins-Minerals (PRESERVISION AREDS PO) Take 2 capsules by mouth daily.     No current facility-administered medications on file prior to visit.    Allergies:  Allergies  Allergen Reactions  . Ace Inhibitors Cough  . Ether    Past Medical History: No past medical history on file. Past Surgical History:  Past Surgical History:  Procedure Laterality Date  . APPLICATION OF CRANIAL NAVIGATION N/A 01/04/2018   Procedure: APPLICATION OF CRANIAL NAVIGATION;  Surgeon: Erline Levine, MD;  Location: [facility];  Service: Neurosurgery;  Laterality: N/A;  . BRAIN SURGERY    . PR DURAL GRAFT REPAIR,SPINE DEFECT Left 01/04/2018   Procedure: Left Pterional craniotomy for biopsy with Brainlab;  Surgeon: Erline Levine, MD;  Location: [facility];  Service: Neurosurgery;  Laterality: Left;  Left Pterional craniotomy for biopsy with Brainlab    Social History:  Social History   Socioeconomic History  . Marital status: Married    Spouse name: Not on file  . Number of children: Not on file  . Years of education: Not on file  . Highest education level: Not on file  Occupational History  . Not on file  Tobacco Use  . Smoking status: Former Smoker    Packs/day: 1.00    Types: Cigarettes  . Smokeless tobacco: Never Used  . Tobacco comment: plans to quit now  Substance and Sexual Activity  . Alcohol use: No  . Drug use: No  . Sexual activity: Yes  Other Topics Concern  . Not on file  Social History Narrative  . Not on file   Social Determinants of Health   Financial Resource Strain:   . Difficulty of Paying Living Expenses: Not on file  Food Insecurity:   . Worried About Charity fundraiser in the Last Year: Not on file  . Ran Out of Food in the Last Year: Not on file  Transportation Needs:   . Lack of Transportation (Medical): Not on file  . Lack of Transportation (Non-Medical): Not on file  Physical Activity:   . Days of Exercise per Week: Not on file  . Minutes of Exercise per Session: Not on file  Stress:   . Feeling of Stress : Not on file  Social Connections:   . Frequency of Communication with Friends and Family: Not on file  . Frequency of Social Gatherings with Friends and  Family: Not on file  . Attends Religious Services: Not on file  . Active Member of Clubs or Organizations: Not on file  . Attends Archivist Meetings: Not on file  . Marital Status: Not on file  Intimate Partner Violence:   . Fear of Current or Ex-Partner: Not on file  . Emotionally Abused: Not on file  . Physically Abused: Not on file  . Sexually Abused: Not on file   Family History:  Family History  Problem Relation Age of Onset  . Colon cancer Mother   . CAD Father   . Cancer Father   . Diabetes Father   . Heart disease Father   . Hypertension Father   . Diabetes Sister   . Diabetes Brother     Review  of Systems: Constitutional: Denies fevers, chills or abnormal weight loss Eyes: Denies blurriness of vision Ears, nose, mouth, throat, and face: +hair loss Respiratory: Denies cough, dyspnea or wheezes Cardiovascular: Denies palpitation, chest discomfort or lower extremity swelling Gastrointestinal:  Denies nausea, constipation, diarrhea GU: Denies dysuria or incontinence Skin: Denies abnormal skin rashes Neurological: Per HPI Musculoskeletal: Denies joint pain, back or neck discomfort. No decrease in ROM Behavioral/Psych: +anxiety  Physical Exam: Vitals:   12/18/19 0949  BP: 134/81  Pulse: 77  Resp: 18  Temp: 97.8 F (36.6 C)  SpO2: 98%   KPS: 90. General: Alert, cooperative, pleasant, in no acute distress Head: Normal EENT: No conjunctival injection or scleral icterus. Oral mucosa moist Lungs: Resp effort normal Cardiac: Regular rate and rhythm Abdomen: Soft, non-distended abdomen Skin: No rashes cyanosis or petechiae. Extremities: no edema  Neurologic Exam: Mental Status: Awake, alert, attentive to examiner. Oriented to self and environment. Language is fluent with intact comprehension.  Cranial Nerves: Visual acuity is grossly normal. Visual fields are full. Extra-ocular movements intact. No ptosis. Face is symmetric, tongue midline. Motor: Tone and bulk are normal. Power is full in both arms and legs. Reflexes are symmetric, no pathologic reflexes present. Intact finger to nose bilaterally Sensory: Intact to light touch and temperature Gait: Normal and tandem gait is deferred   Labs: I have reviewed the data as listed    Component Value Date/Time   NA 142 12/05/2019 1059   K 4.4 12/05/2019 1059   CL 103 12/05/2019 1059   CO2 23 12/05/2019 1059   GLUCOSE 99 12/05/2019 1059   GLUCOSE 118 (H) 01/10/2018 0738   BUN 21 12/05/2019 1059   CREATININE 0.65 12/05/2019 1059   CALCIUM 9.4 12/05/2019 1059   PROT 7.1 12/05/2019 1059   ALBUMIN 4.6 12/05/2019 1059   AST  14 12/05/2019 1059   ALT 16 12/05/2019 1059   ALKPHOS 167 (H) 12/05/2019 1059   BILITOT 0.3 12/05/2019 1059   GFRNONAA 92 12/05/2019 1059   GFRAA 105 12/05/2019 1059   Lab Results  Component Value Date   WBC 5.8 05/27/2018   NEUTROABS 4.2 05/27/2018   HGB 12.2 05/27/2018   HCT 38.0 05/27/2018   MCV 96.4 05/27/2018   PLT 358 05/27/2018    Imaging:  [facility] Clinician Interpretation: I have personally reviewed the CNS images as listed.  My interpretation, in the context of the patient's clinical presentation, is stable disease  MR Brain W Wo Contrast  Result Date: 12/15/2019 CLINICAL DATA:  Follow-up brain mass. History of nondiagnostic biopsy EXAM: MRI HEAD WITHOUT AND WITH CONTRAST TECHNIQUE: Multiplanar, multiecho pulse sequences of the brain and surrounding structures were obtained without and with intravenous contrast. CONTRAST:  40mL  GADAVIST GADOBUTROL 1 MMOL/ML IV SOLN COMPARISON:  Multiple priors, most recently 09/08/2019 FINDINGS: Brain: Slight decrease in size of the enhancing 11 x 10 x 9 mm mass along the surface of the lateral and anterior left frontal lobe, contacting the dura. Mildly heterogeneous enhancement pattern is unchanged. No enhancement seen at the level of multi cystic encephalomalacia lower left sylvian fissure where there was enhancing disease seen more remotely. No new site of enhancement. Vasogenic edema appearance is approved around the enhancing lesion today. 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 Vascular: Normal flow voids Skull and upper cervical spine: Normal marrow signal Sinuses/Orbits: Negative IMPRESSION: 1. Slight decrease in size of the enhancing left frontal mass. Vasogenic edema is significantly improved. 2. No new areas of disease. 3. Stable appearance of remote disease along the lower left sylvian fissure, currently with encephalomalacia and no enhancement.  Electronically Signed   By: Monte Fantasia M.D.   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 Piedra is clinically stable today.  Newer left frontal lesion initially identified on brain MRI 4 months ago is stable to regressed in terms of enhancing burden.  Accompanying T2 signal representing edema is clearly decreased today.   As prior, although we still do not have formal diagnosis, we are confident underlying etiology is inflammatory rather than neoplastic, and have greatest suspicion for neurosarcoidosis.    No further steroids or immunomodulation at this time.  Given changes today we ask that she return in 4 months with an MRI brain for further review, or sooner if needed clinically.    We appreciate the opportunity to participate in the care of [employer]   All questions were answered. The patient knows to call the clinic with any problems, questions or concerns. No barriers to learning were detected.  The total time spent in the encounter was 30 minutes and more than 50% was on counseling and review of test results   [name], MD Medical Director of Neuro-Oncology [facility] at [place] 12/18/19 9:58 AM

## 2019-12-19 ENCOUNTER — Telehealth: Payer: Self-pay | Admitting: Internal Medicine

## 2019-12-19 NOTE — Telephone Encounter (Signed)
Scheduled appt per 2/15 los. ° °Sent a message to HIM pool to get a calendar mailed out. ° °

## 2019-12-22 ENCOUNTER — Ambulatory Visit: Payer: Medicare Other

## 2019-12-25 ENCOUNTER — Other Ambulatory Visit: Payer: Self-pay | Admitting: *Deleted

## 2019-12-25 ENCOUNTER — Encounter: Payer: Self-pay | Admitting: Internal Medicine

## 2019-12-25 DIAGNOSIS — G9389 Other specified disorders of brain: Secondary | ICD-10-CM

## 2020-01-24 ENCOUNTER — Other Ambulatory Visit: Payer: Self-pay | Admitting: Radiation Therapy

## 2020-03-13 ENCOUNTER — Other Ambulatory Visit: Payer: Self-pay | Admitting: *Deleted

## 2020-03-13 MED ORDER — LEVETIRACETAM 250 MG PO TABS: 250.0000 mg | ORAL_TABLET | Freq: Two times a day (BID) | ORAL | 5 refills | Status: DC

## 2020-04-19 ENCOUNTER — Ambulatory Visit (HOSPITAL_COMMUNITY)
Admission: RE | Admit: 2020-04-19 | Discharge: 2020-04-19 | Disposition: A | Discharge status: Home / Self Care | Payer: Medicare Other | Source: Ambulatory Visit | Attending: Internal Medicine | Admitting: Internal Medicine

## 2020-04-19 ENCOUNTER — Other Ambulatory Visit: Payer: Self-pay

## 2020-04-19 DIAGNOSIS — G9389 Other specified disorders of brain: Secondary | ICD-10-CM | POA: Insufficient documentation

## 2020-04-19 DIAGNOSIS — G939 Disorder of brain, unspecified: Secondary | ICD-10-CM | POA: Diagnosis not present

## 2020-04-19 MED ORDER — GADOBUTROL 1 MMOL/ML IV SOLN
10.0000 mL | Freq: Once | INTRAVENOUS | Status: AC | PRN
  Administered 2020-04-19: 10 mL via INTRAVENOUS

## 2020-04-22 ENCOUNTER — Other Ambulatory Visit: Payer: Self-pay

## 2020-04-22 ENCOUNTER — Inpatient Hospital Stay: Payer: Medicare Other | Attending: Internal Medicine | Admitting: Internal Medicine

## 2020-04-22 ENCOUNTER — Telehealth: Payer: Self-pay | Admitting: Internal Medicine

## 2020-04-22 ENCOUNTER — Inpatient Hospital Stay: Payer: Medicare Other

## 2020-04-22 VITALS — BP 166/85 | HR 66 | Temp 98.1°F | Resp 18 | Ht 66.0 in | Wt 210.5 lb

## 2020-04-22 DIAGNOSIS — G9389 Other specified disorders of brain: Secondary | ICD-10-CM

## 2020-04-22 DIAGNOSIS — Z7982 Long term (current) use of aspirin: Secondary | ICD-10-CM | POA: Diagnosis not present

## 2020-04-22 DIAGNOSIS — R569 Unspecified convulsions: Secondary | ICD-10-CM

## 2020-04-22 DIAGNOSIS — G939 Disorder of brain, unspecified: Secondary | ICD-10-CM | POA: Diagnosis not present

## 2020-04-22 DIAGNOSIS — Z79899 Other long term (current) drug therapy: Secondary | ICD-10-CM | POA: Diagnosis not present

## 2020-04-22 DIAGNOSIS — Q043 Other reduction deformities of brain: Secondary | ICD-10-CM | POA: Insufficient documentation

## 2020-04-22 DIAGNOSIS — Z87891 Personal history of nicotine dependence: Secondary | ICD-10-CM | POA: Insufficient documentation

## 2020-04-22 NOTE — Telephone Encounter (Signed)
Scheduled per 6/21 los. Pt is aware of appts on 12/13.

## 2020-04-22 NOTE — Progress Notes (Signed)
[facility] at [place], [place] [address] [phone]   Interval Evaluation  Date of Service: 04/22/20 Patient Name: Tara [name] Patient MRN: [id number] Patient DOB: [birth year] Provider: [name], MD  Identifying Statement:  Tara [name] is a 69 y.o. female with left frontal brain lesion   Interval History:  Tara [name] presents today for follow up after recent MRI brain.  She describes no new or progressive neurologic symptoms aside from mild word finding/expression difficulty.  No recent headaches.   Medications: Current Outpatient Medications on File Prior to Visit  Medication Sig Dispense Refill  . aspirin EC 81 MG tablet Take 1 tablet (81 mg total) by mouth daily.    Marland Kitchen diltiazem (CARDIZEM CD) 180 MG 24 hr capsule Take 1 capsule (180 mg total) by mouth daily. 180 capsule 1  . levETIRAcetam (KEPPRA) 250 MG tablet Take 1 tablet (250 mg total) by mouth 2 (two) times daily. 60 tablet 5  . Multiple Vitamin (MULTIVITAMIN WITH MINERALS) TABS tablet Take 1 tablet by mouth daily.    . Multiple Vitamins-Minerals (PRESERVISION AREDS PO) Take 2 capsules by mouth daily.     No current facility-administered medications on file prior to visit.    Allergies:  Allergies  Allergen Reactions  . Ace Inhibitors Cough  . Ether    Past Medical History: No past medical history on file. Past Surgical History:  Past Surgical History:  Procedure Laterality Date  . APPLICATION OF CRANIAL NAVIGATION N/A 01/04/2018   Procedure: APPLICATION OF CRANIAL NAVIGATION;  Surgeon: Erline Levine, MD;  Location: [facility];  Service: Neurosurgery;  Laterality: N/A;  . BRAIN SURGERY    . PR DURAL GRAFT REPAIR,SPINE DEFECT Left 01/04/2018   Procedure: Left Pterional craniotomy for biopsy with Brainlab;  Surgeon: Erline Levine, MD;  Location: [facility];  Service: Neurosurgery;  Laterality: Left;  Left Pterional craniotomy for biopsy with Brainlab    Social History:  Social History   Socioeconomic History  . Marital status: Married    Spouse name: Not on file  . Number of children: Not on file  . Years of education: Not on file  . Highest education level: Not on file  Occupational History  . Not on file  Tobacco Use  . Smoking status: Former Smoker    Packs/day: 1.00    Types: Cigarettes  . Smokeless tobacco: Never Used  . Tobacco comment: plans to quit now  Vaping Use  . Vaping Use: Never used  Substance and Sexual Activity  . Alcohol use: No  . Drug use: No  . Sexual activity: Yes  Other Topics Concern  . Not on file  Social History Narrative  . Not on file   Social Determinants of Health   Financial Resource Strain:   . Difficulty of Paying Living Expenses:   Food Insecurity:   . Worried About Charity fundraiser in the Last Year:   . Arboriculturist in the Last Year:   Transportation Needs:   . Film/video editor (Medical):   Marland Kitchen Lack of Transportation (Non-Medical):   Physical Activity:   . Days of Exercise per Week:   . Minutes of Exercise per Session:   Stress:   . Feeling of Stress :   Social Connections:   . Frequency of Communication with Friends and Family:   . Frequency of Social Gatherings with Friends and Family:   . Attends Religious Services:   .  Active Member of Clubs or Organizations:   . Attends Archivist Meetings:   Marland Kitchen Marital Status:   Intimate Partner Violence:   . Fear of Current or Ex-Partner:   . Emotionally Abused:   Marland Kitchen Physically Abused:   . Sexually Abused:    Family History:  Family History  Problem Relation Age of Onset  . Colon cancer Mother   . CAD Father   . Cancer Father   . Diabetes Father   . Heart disease Father   . Hypertension Father   . Diabetes Sister   . Diabetes Brother     Review of Systems: Constitutional: Denies fevers, chills or abnormal weight loss Eyes: Denies blurriness of vision Ears, nose, mouth, throat, and face: +hair  loss Respiratory: Denies cough, dyspnea or wheezes Cardiovascular: Denies palpitation, chest discomfort or lower extremity swelling Gastrointestinal:  Denies nausea, constipation, diarrhea GU: Denies dysuria or incontinence Skin: Denies abnormal skin rashes Neurological: Per HPI Musculoskeletal: Denies joint pain, back or neck discomfort. No decrease in ROM Behavioral/Psych: +anxiety  Physical Exam: Vitals:   04/22/20 0948  BP: (!) 166/85  Pulse: 66  Resp: 18  Temp: 98.1 F (36.7 C)  SpO2: 98%   KPS: 90. General: Alert, cooperative, pleasant, in no acute distress Head: Normal EENT: No conjunctival injection or scleral icterus. Oral mucosa moist Lungs: Resp effort normal Cardiac: Regular rate and rhythm Abdomen: Soft, non-distended abdomen Skin: No rashes cyanosis or petechiae. Extremities: no edema  Neurologic Exam: Mental Status: Awake, alert, attentive to examiner. Oriented to self and environment. Language is fluent with intact comprehension.  Cranial Nerves: Visual acuity is grossly normal. Visual fields are full. Extra-ocular movements intact. No ptosis. Face is symmetric, tongue midline. Motor: Tone and bulk are normal. Power is full in both arms and legs. Reflexes are symmetric, no pathologic reflexes present. Intact finger to nose bilaterally Sensory: Intact to light touch and temperature Gait: Normal and tandem gait is deferred   Labs: I have reviewed the data as listed    Component Value Date/Time   NA 142 12/05/2019 1059   K 4.4 12/05/2019 1059   CL 103 12/05/2019 1059   CO2 23 12/05/2019 1059   GLUCOSE 99 12/05/2019 1059   GLUCOSE 118 (H) 01/10/2018 0738   BUN 21 12/05/2019 1059   CREATININE 0.65 12/05/2019 1059   CALCIUM 9.4 12/05/2019 1059   PROT 7.1 12/05/2019 1059   ALBUMIN 4.6 12/05/2019 1059   AST 14 12/05/2019 1059   ALT 16 12/05/2019 1059   ALKPHOS 167 (H) 12/05/2019 1059   BILITOT 0.3 12/05/2019 1059   GFRNONAA 92 12/05/2019 1059   GFRAA  105 12/05/2019 1059   Lab Results  Component Value Date   WBC 5.8 05/27/2018   NEUTROABS 4.2 05/27/2018   HGB 12.2 05/27/2018   HCT 38.0 05/27/2018   MCV 96.4 05/27/2018   PLT 358 05/27/2018    Imaging:  [facility] Clinician Interpretation: I have personally reviewed the CNS images as listed.  My interpretation, in the context of the patient's clinical presentation, is stable disease  MR Brain W Wo Contrast  Result Date: 04/19/2020 CLINICAL DATA:  Follow-up brain mass. EXAM: MRI HEAD WITHOUT AND WITH CONTRAST TECHNIQUE: Multiplanar, multiecho pulse sequences of the brain and surrounding structures were obtained without and with intravenous contrast. CONTRAST:  67mL GADAVIST GADOBUTROL 1 MMOL/ML IV SOLN COMPARISON:  MR head without and with contrast 12/15/2019 FINDINGS: Brain: The area of focal enhancement in the anterior left frontal lobe with dural enhancement  is decreased in size. The enhancement is more homogeneous. Surrounding vasogenic edema continues to decrease. No new lesions are present. Cystic lesion in the left temporal tip is stable. Periventricular white matter changes and remote lacunar infarcts are present. Generalized atrophy is otherwise stable. No acute infarct or hemorrhage is present. Ventricles are of normal size. No significant extraaxial fluid collection is present. The internal auditory canals are within normal limits. White matter changes extend into the brainstem. Cerebellum is unremarkable. Vascular: Flow is present in the major intracranial arteries. Skull and upper cervical spine: Marrow enhancement present petrous temporal bone lateral to the left orbit. No soft tissue enhancement is present in the orbit. Sinuses/Orbits: Polyp or mucous retention cyst is again noted in the inferior left maxillary sinus. Minimal fluid is present in right mastoid air cells. Globes and orbits are within limits. IMPRESSION: 1. Continued decrease in size of focal enhancement in the anterior left  frontal lobe with decreased dural enhancement. Surrounding vasogenic edema continues to decrease. Inflammatory etiology continues to be favored. 2. No new lesions are present. 3. Stable atrophy and white matter disease. This likely reflects the sequela of chronic microvascular ischemia. 4. Left petrous temporal bone marrow enhancement lateral to the left orbit. This may be related to inflammatory or infectious process. No discrete mass lesion is present. Electronically Signed   By: San Morelle M.D.   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 Reinhardt is clinically and radiographically stable today.  Symptomatic eft frontal lesion is stable to regressed in terms of enhancing burden.    Underlying etiology is likely inflammatory rather than neoplastic, and have greatest suspicion for neurosarcoidosis.    No further steroids or immunomodulation at this time.  We ask that [name] return to clinic in 6 months following next brain MRI, or sooner as needed.   We appreciate the opportunity to participate in the care of [employer]   All questions were answered. The patient knows to call the clinic with any problems, questions or concerns. No barriers to learning were detected.  The total time spent in the encounter was 30 minutes and more than 50% was on counseling and review of test results   [name], MD Medical Director of Neuro-Oncology [facility] at [place] 04/22/20 9:44 AM

## 2020-05-28 DIAGNOSIS — H353132 Nonexudative age-related macular degeneration, bilateral, intermediate dry stage: Secondary | ICD-10-CM | POA: Diagnosis not present

## 2020-05-28 DIAGNOSIS — H5347 Heteronymous bilateral field defects: Secondary | ICD-10-CM | POA: Diagnosis not present

## 2020-05-28 DIAGNOSIS — H1045 Other chronic allergic conjunctivitis: Secondary | ICD-10-CM | POA: Diagnosis not present

## 2020-05-28 DIAGNOSIS — H25811 Combined forms of age-related cataract, right eye: Secondary | ICD-10-CM | POA: Diagnosis not present

## 2020-05-28 DIAGNOSIS — H2512 Age-related nuclear cataract, left eye: Secondary | ICD-10-CM | POA: Diagnosis not present

## 2020-06-28 DIAGNOSIS — H25811 Combined forms of age-related cataract, right eye: Secondary | ICD-10-CM | POA: Diagnosis not present

## 2020-07-23 ENCOUNTER — Other Ambulatory Visit: Payer: Self-pay | Admitting: Radiation Therapy

## 2020-08-12 DIAGNOSIS — Z23 Encounter for immunization: Secondary | ICD-10-CM | POA: Diagnosis not present

## 2020-08-30 DIAGNOSIS — H25812 Combined forms of age-related cataract, left eye: Secondary | ICD-10-CM | POA: Diagnosis not present

## 2020-09-11 ENCOUNTER — Other Ambulatory Visit: Payer: Self-pay | Admitting: Internal Medicine

## 2020-10-07 ENCOUNTER — Emergency Department (HOSPITAL_COMMUNITY): Payer: Medicare Other

## 2020-10-07 ENCOUNTER — Encounter (HOSPITAL_COMMUNITY): Payer: Self-pay

## 2020-10-07 ENCOUNTER — Inpatient Hospital Stay (HOSPITAL_COMMUNITY)
Admission: EM | Admit: 2020-10-07 | Discharge: 2020-10-09 | DRG: 071 | Disposition: A | Discharge status: Home / Self Care | Payer: Medicare Other | Attending: Family Medicine | Admitting: Family Medicine

## 2020-10-07 ENCOUNTER — Other Ambulatory Visit: Payer: Self-pay

## 2020-10-07 DIAGNOSIS — Z7982 Long term (current) use of aspirin: Secondary | ICD-10-CM

## 2020-10-07 DIAGNOSIS — G9519 Other vascular myelopathies: Secondary | ICD-10-CM | POA: Diagnosis not present

## 2020-10-07 DIAGNOSIS — G9589 Other specified diseases of spinal cord: Secondary | ICD-10-CM | POA: Diagnosis not present

## 2020-10-07 DIAGNOSIS — R2689 Other abnormalities of gait and mobility: Secondary | ICD-10-CM | POA: Diagnosis present

## 2020-10-07 DIAGNOSIS — G373 Acute transverse myelitis in demyelinating disease of central nervous system: Secondary | ICD-10-CM | POA: Diagnosis present

## 2020-10-07 DIAGNOSIS — J069 Acute upper respiratory infection, unspecified: Secondary | ICD-10-CM | POA: Diagnosis present

## 2020-10-07 DIAGNOSIS — Z888 Allergy status to other drugs, medicaments and biological substances status: Secondary | ICD-10-CM | POA: Diagnosis not present

## 2020-10-07 DIAGNOSIS — I69398 Other sequelae of cerebral infarction: Secondary | ICD-10-CM

## 2020-10-07 DIAGNOSIS — B9789 Other viral agents as the cause of diseases classified elsewhere: Secondary | ICD-10-CM | POA: Diagnosis not present

## 2020-10-07 DIAGNOSIS — Z8249 Family history of ischemic heart disease and other diseases of the circulatory system: Secondary | ICD-10-CM

## 2020-10-07 DIAGNOSIS — G9389 Other specified disorders of brain: Principal | ICD-10-CM | POA: Diagnosis present

## 2020-10-07 DIAGNOSIS — Z79899 Other long term (current) drug therapy: Secondary | ICD-10-CM

## 2020-10-07 DIAGNOSIS — M47816 Spondylosis without myelopathy or radiculopathy, lumbar region: Secondary | ICD-10-CM | POA: Diagnosis not present

## 2020-10-07 DIAGNOSIS — I10 Essential (primary) hypertension: Secondary | ICD-10-CM | POA: Diagnosis present

## 2020-10-07 DIAGNOSIS — M48061 Spinal stenosis, lumbar region without neurogenic claudication: Secondary | ICD-10-CM | POA: Diagnosis present

## 2020-10-07 DIAGNOSIS — R059 Cough, unspecified: Secondary | ICD-10-CM | POA: Diagnosis not present

## 2020-10-07 DIAGNOSIS — Z20822 Contact with and (suspected) exposure to covid-19: Secondary | ICD-10-CM | POA: Diagnosis present

## 2020-10-07 DIAGNOSIS — M4802 Spinal stenosis, cervical region: Secondary | ICD-10-CM | POA: Diagnosis not present

## 2020-10-07 DIAGNOSIS — Z87891 Personal history of nicotine dependence: Secondary | ICD-10-CM | POA: Diagnosis not present

## 2020-10-07 DIAGNOSIS — I69331 Monoplegia of upper limb following cerebral infarction affecting right dominant side: Secondary | ICD-10-CM | POA: Diagnosis not present

## 2020-10-07 DIAGNOSIS — Z8572 Personal history of non-Hodgkin lymphomas: Secondary | ICD-10-CM | POA: Diagnosis not present

## 2020-10-07 DIAGNOSIS — R519 Headache, unspecified: Secondary | ICD-10-CM | POA: Diagnosis present

## 2020-10-07 DIAGNOSIS — D8689 Sarcoidosis of other sites: Secondary | ICD-10-CM | POA: Diagnosis not present

## 2020-10-07 DIAGNOSIS — R9082 White matter disease, unspecified: Secondary | ICD-10-CM | POA: Diagnosis not present

## 2020-10-07 DIAGNOSIS — J90 Pleural effusion, not elsewhere classified: Secondary | ICD-10-CM | POA: Diagnosis not present

## 2020-10-07 LAB — CBC WITH DIFFERENTIAL/PLATELET
Abs Immature Granulocytes: 0.03 10*3/uL (ref 0.00–0.07)
Basophils Absolute: 0 10*3/uL (ref 0.0–0.1)
Basophils Relative: 0 %
Eosinophils Absolute: 0 10*3/uL (ref 0.0–0.5)
Eosinophils Relative: 1 %
HCT: 40.4 % (ref 36.0–46.0)
Hemoglobin: 13 g/dL (ref 12.0–15.0)
Immature Granulocytes: 0 %
Lymphocytes Relative: 14 %
Lymphs Abs: 1.1 10*3/uL (ref 0.7–4.0)
MCH: 31 pg (ref 26.0–34.0)
MCHC: 32.2 g/dL (ref 30.0–36.0)
MCV: 96.4 fL (ref 80.0–100.0)
Monocytes Absolute: 0.7 10*3/uL (ref 0.1–1.0)
Monocytes Relative: 8 %
Neutro Abs: 6.3 10*3/uL (ref 1.7–7.7)
Neutrophils Relative %: 77 %
Platelets: 377 10*3/uL (ref 150–400)
RBC: 4.19 MIL/uL (ref 3.87–5.11)
RDW: 12.3 % (ref 11.5–15.5)
WBC: 8.1 10*3/uL (ref 4.0–10.5)
nRBC: 0 % (ref 0.0–0.2)

## 2020-10-07 LAB — BASIC METABOLIC PANEL
Anion gap: 11 (ref 5–15)
BUN: 25 mg/dL — ABNORMAL HIGH (ref 8–23)
CO2: 26 mmol/L (ref 22–32)
Calcium: 9.3 mg/dL (ref 8.9–10.3)
Chloride: 102 mmol/L (ref 98–111)
Creatinine, Ser: 0.59 mg/dL (ref 0.44–1.00)
GFR, Estimated: >60 mL/min (ref >60)
Glucose, Bld: 96 mg/dL (ref 70–99)
Potassium: 3.6 mmol/L (ref 3.5–5.1)
Sodium: 139 mmol/L (ref 135–145)

## 2020-10-07 LAB — RESP PANEL BY RT-PCR (FLU A&B, COVID) ARPGX2
Influenza A by PCR: NEGATIVE
Influenza B by PCR: NEGATIVE
SARS Coronavirus 2 by RT PCR: NEGATIVE

## 2020-10-07 MED ORDER — ENOXAPARIN SODIUM 40 MG/0.4ML ~~LOC~~ SOLN
40.0000 mg | SUBCUTANEOUS | Status: DC
  Filled 2020-10-07: qty 0.4

## 2020-10-07 MED ORDER — GADOBUTROL 1 MMOL/ML IV SOLN
9.5000 mL | Freq: Once | INTRAVENOUS | Status: AC | PRN
  Administered 2020-10-07: 9.5 mL via INTRAVENOUS

## 2020-10-07 MED ORDER — ASPIRIN EC 81 MG PO TBEC: 81.0000 mg | DELAYED_RELEASE_TABLET | Freq: Every day | ORAL | Status: DC

## 2020-10-07 MED ORDER — ADULT MULTIVITAMIN W/MINERALS CH: 1.0000 | ORAL_TABLET | Freq: Every day | ORAL | Status: DC

## 2020-10-07 MED ORDER — LORATADINE 10 MG PO TABS
10.0000 mg | ORAL_TABLET | Freq: Every day | ORAL | Status: DC
  Filled 2020-10-07: qty 1

## 2020-10-07 MED ORDER — FLUTICASONE PROPIONATE 50 MCG/ACT NA SUSP
1.0000 | Freq: Every day | NASAL | Status: DC
  Administered 2020-10-07: 1 via NASAL
  Filled 2020-10-07: qty 16

## 2020-10-07 MED ORDER — SODIUM CHLORIDE 0.9 % IV SOLN
1000.0000 mg | Freq: Once | INTRAVENOUS | Status: AC
  Administered 2020-10-07: 1000 mg via INTRAVENOUS
  Filled 2020-10-07: qty 8

## 2020-10-07 MED ORDER — PANTOPRAZOLE SODIUM 40 MG PO TBEC
40.0000 mg | DELAYED_RELEASE_TABLET | Freq: Every day | ORAL | Status: DC
  Filled 2020-10-07: qty 1

## 2020-10-07 MED ORDER — ACETAMINOPHEN 325 MG PO TABS
650.0000 mg | ORAL_TABLET | Freq: Four times a day (QID) | ORAL | Status: DC | PRN
  Administered 2020-10-07 – 2020-10-08 (×2): 650 mg via ORAL
  Filled 2020-10-07 (×2): qty 2

## 2020-10-07 MED ORDER — LEVETIRACETAM 250 MG PO TABS
250.0000 mg | ORAL_TABLET | Freq: Two times a day (BID) | ORAL | Status: DC
  Filled 2020-10-07 (×3): qty 1

## 2020-10-07 MED ORDER — SODIUM CHLORIDE 0.9 % IV SOLN
1000.0000 mg | INTRAVENOUS | Status: DC
  Administered 2020-10-08 – 2020-10-09 (×2): 1000 mg via INTRAVENOUS
  Filled 2020-10-07 (×2): qty 8

## 2020-10-07 MED ORDER — DILTIAZEM HCL ER COATED BEADS 180 MG PO CP24: 180.0000 mg | ORAL_CAPSULE | Freq: Every day | ORAL | Status: DC

## 2020-10-07 NOTE — Progress Notes (Signed)
Unable to do another MRI until tomorrow due to the patient already receiving MRI contrast.

## 2020-10-07 NOTE — ED Triage Notes (Signed)
Patient reports she has a brain tumor and has been coughing recently. Patient reports she coughed up blood. Patient denies any chest pain, SOB, N/V/D. Patient reports she is due for her next MRI of the brain soon.

## 2020-10-07 NOTE — ED Notes (Signed)
Pt transported to MRI 

## 2020-10-07 NOTE — ED Provider Notes (Signed)
Signout from Dr. Tamera Punt.  69 year old female with URI symptoms.  Follows with neuro oncology for brain lesion.  Received an MRI today with new findings.  Awaiting recommendations from neuro-oncology. Physical Exam  BP (!) 147/80 (BP Location: Right Arm)   Pulse 88   Temp 98 F (36.7 C) (Oral)   Resp 18   SpO2 96%   Physical Exam  ED Course/Procedures     Procedures  MDM  Dr. Mickeal Skinner recommended the patient be admitted to the hospital.  Will need MRI with and without of cervical and thoracic spine.  Solu-Medrol 1 g x 5 days.  Reviewed neuro oncology recommendations with patient and she is comfortable with plan for admission.  I have ordered some screening blood work and a Covid swab.  Discussed with [facility] hospitalist Dr. Tawanna Solo who will evaluate the patient for admission       Hayden Rasmussen, MD 10/08/20 [phone]

## 2020-10-07 NOTE — ED Provider Notes (Signed)
[facility] DEPT Provider Note   CSN: [id number] Arrival date & time: 10/07/20  1101     History Chief Complaint  Patient presents with  . Cough    Tara [name] is a 69 y.o. female.  Patient is a 69 year old female who presents with a cough.  She has had some runny nose nasal congestion and postnasal drip for 3 days.  She has had some ongoing coughing since that time as well.  Today she had one episode of coughing where she had some blood-tinged sputum.  She has had no further episodes of bloody sputum.  She has no chest pain.  No shortness of breath.  No vomiting or diarrhea.  No known fevers.  She is fully vaccinated for Covid.  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  She is followed by oncology although per notes, it was not felt to be cancerous in nature.  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  It seems to be fairly stable on her prior MRIs.  She is scheduled to have a repeat MRI on Friday.  She reports a 1 week history of worsening headache in the back of her head.  She says it comes and goes but seems to be more frequent over the last couple days.  She has no posterior neck pain.  No dizziness.  No nausea or vomiting.  No vision changes or speech deficits.  She said normally when she has headaches associated with the brain lesion, its more in the front of her head.        No past medical history on file.  Patient Active Problem List   Diagnosis Date Noted  . 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 Stroke ([place]) 01/21/2018  . Reactive hypertension   . Hypoalbuminemia due to protein-calorie malnutrition ([place])   . Seizures ([place])   . Elevated BUN   . Brain mass 01/06/2018  . Aphasia   . Abnormal CT of the head   . CNS mass 01/01/2018  . Acute metabolic encephalopathy 39/76/7341  . Headache 12/31/2017  .  Hypertension 12/31/2017  . Hyperglycemia 12/31/2017  . Confusion 12/31/2017    Past Surgical History:  Procedure Laterality Date  . APPLICATION OF CRANIAL NAVIGATION N/A 01/04/2018   Procedure: APPLICATION OF CRANIAL NAVIGATION;  Surgeon: Erline Levine, MD;  Location: [facility];  Service: Neurosurgery;  Laterality: N/A;  . BRAIN SURGERY    . PR DURAL GRAFT REPAIR,SPINE DEFECT Left 01/04/2018   Procedure: Left Pterional craniotomy for biopsy with Brainlab;  Surgeon: Erline Levine, MD;  Location: [facility];  Service: Neurosurgery;  Laterality: Left;  Left Pterional craniotomy for biopsy with Brainlab     OB History   No obstetric history on file.     Family History  Problem Relation Age of Onset  . Colon cancer Mother   . CAD Father   . Cancer Father   . Diabetes Father   . Heart disease Father   . Hypertension Father   . Diabetes Sister   . Diabetes Brother     Social History   Tobacco Use  . Smoking status: Former Smoker    Packs/day: 1.00    Types: Cigarettes  . Smokeless tobacco: Never Used  . Tobacco comment: plans to quit now  Vaping Use  . Vaping Use: Never used  Substance Use Topics  . Alcohol use:  No  . Drug use: No    Home Medications Prior to Admission medications   Medication Sig Start Date End Date Taking? Authorizing Provider  aspirin EC 81 MG tablet Take 1 tablet (81 mg total) by mouth daily. 01/21/18   [name], MD  diltiazem (CARDIZEM CD) 180 MG 24 hr capsule Take 1 capsule (180 mg total) by mouth daily. 12/05/19   Forrest Moron, MD  levETIRAcetam (KEPPRA) 250 MG tablet TAKE 1 TABLET BY MOUTH TWICE A DAY 09/11/20   Vaslow, Acey Lav, MD  Multiple Vitamin (MULTIVITAMIN WITH MINERALS) TABS tablet Take 1 tablet by mouth daily.    [provider]  Multiple Vitamins-Minerals (PRESERVISION AREDS PO) Take 2 capsules by mouth daily.    [provider]    Allergies    Ace inhibitors and Ether  Review of Systems   Review of Systems    Constitutional: Positive for fatigue. Negative for chills, diaphoresis and fever.  HENT: Positive for congestion and rhinorrhea. Negative for sneezing.   Eyes: Negative.   Respiratory: Negative for cough, chest tightness and shortness of breath.   Cardiovascular: Negative for chest pain and leg swelling.  Gastrointestinal: Negative for abdominal pain, blood in stool, diarrhea, nausea and vomiting.  Genitourinary: Negative for difficulty urinating, flank pain, frequency and hematuria.  Musculoskeletal: Negative for arthralgias and back pain.  Skin: Negative for rash.  Neurological: Positive for headaches. Negative for dizziness, speech difficulty, weakness and numbness.    Physical Exam Updated Vital Signs BP (!) 147/80 (BP Location: Right Arm)   Pulse 88   Temp 98 F (36.7 C) (Oral)   Resp 18   SpO2 96%   Physical Exam Constitutional:      Appearance: She is well-developed.  HENT:     Head: Normocephalic and atraumatic.     Nose: Congestion present.  Eyes:     Pupils: Pupils are equal, round, and reactive to light.  Neck:     Comments: No meningismus Cardiovascular:     Rate and Rhythm: Normal rate and regular rhythm.     Heart sounds: Normal heart sounds.  Pulmonary:     Effort: Pulmonary effort is normal. No respiratory distress.     Breath sounds: Normal breath sounds. No wheezing or rales.  Chest:     Chest wall: No tenderness.  Abdominal:     General: Bowel sounds are normal.     Palpations: Abdomen is soft.     Tenderness: There is no abdominal tenderness. There is no guarding or rebound.  Musculoskeletal:        General: Normal range of motion.     Cervical back: Normal range of motion and neck supple.  Lymphadenopathy:     Cervical: No cervical adenopathy.  Skin:    General: Skin is warm and dry.     Findings: No rash.  Neurological:     General: No focal deficit present.     Mental Status: She is alert and oriented to person, place, and time.      Comments: Motor 5/5 all extremities Sensation grossly intact to LT all extremities, no pronator drift CN II-XII grossly intact       ED Results / Procedures / Treatments   Labs (all labs ordered are listed, but only abnormal results are displayed) Labs Reviewed  RESP PANEL BY RT-PCR (FLU A&B, COVID) [id number]    EKG None  Radiology DG Chest 2 View  Result Date: 10/07/2020 CLINICAL DATA:  Cough. EXAM: CHEST -  2 VIEW COMPARISON:  None. FINDINGS: The heart size and mediastinal contours are within normal limits. Tortuous aorta. Both lungs are clear. Hyperinflation. No visible pleural effusions or pneumothorax. No acute osseous abnormality. IMPRESSION: 1. No active cardiopulmonary disease. 2. Hyperinflation. Electronically Signed   By: Margaretha Sheffield MD   On: 10/07/2020 11:48   MR Brain W and Wo Contrast  Result Date: 10/07/2020 CLINICAL DATA:  69 year old female with a history of left frontotemporal brain lesion since 2019, initially worked up for [place]. Status post craniotomy and biopsy March 2019 revealing "macrophage rich lesion". Recently, underlying etiology favored to be inflammatory rather than neoplastic, with greatest suspicion of neurosarcoidosis. New onset cough, hemoptysis. EXAM: MRI HEAD WITHOUT AND WITH CONTRAST TECHNIQUE: Multiplanar, multiecho pulse sequences of the brain and surrounding structures were obtained without and with intravenous contrast. CONTRAST:  9.44mL GADAVIST GADOBUTROL 1 MMOL/ML IV SOLN COMPARISON:  Brain MRI 04/19/2020 and earlier. Total spine MRI 05/23/2018. FINDINGS: Brain: Cerebral volume remains stable. No restricted diffusion to suggest acute infarction. No midline shift, mass effect, ventriculomegaly, extra-axial collection or acute intracranial hemorrhage. Pituitary within normal limits. However, there is confluent new abnormal T2 and FLAIR hyperintense signal in the lower brainstem and cervicomedullary junction (series 8, image 4 and series 11,  image 6) and evidence that the abnormal signal continues into the cervical spinal cord (series 15, image 19 and series 7, image 13. Near complete involvement of the cervicomedullary junction, and right eccentric near holo cord involvement of the visible upper spinal cord. Minimal associated masslike expansion of the cervicomedullary junction. Associated new abnormal surface and to a lesser extent underlying parenchymal enhancement along the right posterolateral cervicomedullary junction (series 18, image 13 and series 16, image 13). Diffusion appears facilitated in the lesion. No evidence of hemorrhage. Elsewhere stable 9 mm dural-based solid enhancing lesion along the left inferior frontal convexity (series 16, image 87). Regional smooth dural thickening is stable. Regional T2 and FLAIR hyperintensity has regressed since June (series 11, image 29). Stable superimposed left anterior temporal lobe surgical cavity and regional T2/FLAIR signal abnormality with no enhancement. Elsewhere gray and white matter signal remains stable throughout the brain and no other new or abnormal intracranial enhancement or dural thickening is identified. Vascular: Major intracranial vascular flow voids are stable. The major dural venous sinuses are enhancing and appear to be patent. Skull and upper cervical spine: Suspicion of abnormal signal now throughout much of the visible cervical spinal cord (as above). Superimposed upper cervical spine degeneration appears stable. Underlying bone marrow signal is stable and within normal limits. Previous left frontotemporal craniotomy. Sinuses/Orbits: Postoperative changes to both globes since June. Otherwise stable and negative. Other: Mastoids remain well aerated. Grossly normal visible internal auditory structures. IMPRESSION: 1. New since June extensive abnormal signal in the lower brainstem and visible spinal cord. Associated new abnormal enhancement, primarily surface enhancement along the  right posterior cervicomedullary junction. Burtis Junes this is progression of the underlying chronic CNS inflammatory process. Follow-up Cervical and/or Total Spine MRI without and with contrast would be valuable and could be compared to the 2019 total spine MRI. 2. Elsewhere stable MRI appearance of the brain since June - actually with some regression of abnormal FLAIR signal in the area of the chronic left inferior frontal gyrus lesion. Electronically Signed   By: Genevie Ann M.D.   On: 10/07/2020 15:18    Procedures Procedures (including critical care time)  Medications Ordered in ED Medications  gadobutrol (GADAVIST) 1 MMOL/ML injection 9.5 mL (9.5 mLs  Intravenous Contrast Given 10/07/20 1434)    ED Course  I have reviewed the triage vital signs and the nursing notes.  Pertinent labs & imaging results that were available during my care of the patient were reviewed by me and considered in my medical decision making (see chart for details).    MDM Rules/Calculators/A&P                          Patient is a 69 year old female who presents with URI symptoms. Her chest x-ray is clear without evidence of pneumonia. However for the last week she has had a worsening headache. It does not really seem to be associated with a URI. To the posterior aspect of her head. No meningismus or associated neck pain. She does not have any neurologic deficits. I spoke with her neuro oncologist, Dr. Mickeal Skinner. She was supposed to have a monitoring follow-up MRI on Friday. We will go ahead and get that done today.  MRI shows some abnormal signal at brainstem, possibly extending into spinal canal.  Recommend MR of remainder of spine.  Dr. Mickeal Skinner recommends admission with solumedrol 1g IV for 5 days and neuro consult.  Dr. Melina Copa to start this and call for admssion.  Final Clinical Impression(s) / ED Diagnoses Final diagnoses:  Viral upper respiratory tract infection  Brain mass    Rx / DC Orders ED Discharge Orders     None       Malvin Johns, MD 10/08/20 [phone]

## 2020-10-07 NOTE — H&P (Signed)
History and Physical    Tara [name] IRJ:[id number] DOB: 1951/04/05 DOA: 10/07/2020  PCP: Forrest Moron, MD   Patient coming from: Home    Chief Complaint: Cough, runny nose  HPI: Tara [name] is a 69 y.o. 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 Mickeal Skinner who presents to the emergency department for the evaluation of runny nose, cough.  She was reporting nasal congestion, postnasal drip for last 3 days.  She lives with her husband.  Patient has been following with neuro oncology for last several years for the management of possible neurosarcoidosis and has underwent extensive work-up without clear evidence including brain biopsy.  She was found to have brain masses which were not found to inflammatory than neoplastic. Patient has history of aphasia/word finding difficulty and some memory deficits due to this but she does not have any focal neurological deficits and is fully functional. Patient did not report any fever, chills, exposure to Covid , nausea, vomiting, abdominal pain, palpitations, diarrhea, dysuria or hematochezia.  She was having some headache mostly in the frontal sinus area.  She has been fully vaccinated against Covid. Chest x-ray done in the emergency department did not show any pneumonia. Patient was planned for follow-up MRI soon as per neuro-oncology.  Since patient presented to the hospital today with upper respiratory symptoms, decision was made to do the MRI here.  MRI showed abnormal findings as below.  ED Course: Hypertensive  during my evaluation in the emergency department.  Complains of some cough and runny nose.  Denies any headache during my evaluation.  Examination did not reveal any significant finding.  MRI of the brain showed extensive abnormal signal in the lower brainstem and visible spinal cord,associated new abnormal enhancement, primarily surface enhancement along the right posterior  cervicomedullary junction.  Case was discussed with Dr. Mickeal Skinner on phone about the above findings.  Dr. Mickeal Skinner recommended admission for IV Solu-Medrol for 5 days given the possibility of relapse/exacerbation of neurosarcoidosis.  Review of Systems: As per HPI otherwise 10 point review of systems negative.    History reviewed. No pertinent past medical history.  Past Surgical History:  Procedure Laterality Date  . APPLICATION OF CRANIAL NAVIGATION N/A 01/04/2018   Procedure: APPLICATION OF CRANIAL NAVIGATION;  Surgeon: Erline Levine, MD;  Location: [facility];  Service: Neurosurgery;  Laterality: N/A;  . BRAIN SURGERY    . PR DURAL GRAFT REPAIR,SPINE DEFECT Left 01/04/2018   Procedure: Left Pterional craniotomy for biopsy with Brainlab;  Surgeon: Erline Levine, MD;  Location: [facility];  Service: Neurosurgery;  Laterality: Left;  Left Pterional craniotomy for biopsy with Brainlab     reports that she has quit smoking. Her smoking use included cigarettes. She smoked 1.00 pack per day. She has never used smokeless tobacco. She reports that she does not drink alcohol and does not use drugs.  Allergies  Allergen Reactions  . Ace Inhibitors Cough  . Ether     Family History  Problem Relation Age of Onset  . Colon cancer Mother   . CAD Father   . Cancer Father   . Diabetes Father   . Heart disease Father   . Hypertension Father   . Diabetes Sister   . Diabetes Brother      Prior to Admission medications   Medication Sig Start Date End Date Taking? Authorizing Provider  aspirin EC 81 MG tablet Take 1 tablet (81 mg total) by mouth daily. 01/21/18   Vaslow,  Acey Lav, MD  diltiazem (CARDIZEM CD) 180 MG 24 hr capsule Take 1 capsule (180 mg total) by mouth daily. 12/05/19   Forrest Moron, MD  levETIRAcetam (KEPPRA) 250 MG tablet TAKE 1 TABLET BY MOUTH TWICE A DAY 09/11/20   Vaslow, Acey Lav, MD  Multiple Vitamin (MULTIVITAMIN WITH MINERALS) TABS tablet Take 1 tablet by mouth daily.    [provider]  Multiple Vitamins-Minerals (PRESERVISION AREDS PO) Take 2 capsules by mouth daily.    [provider]    Physical Exam: Vitals:   10/07/20 1116 10/07/20 1401 10/07/20 1557 10/07/20 1718  BP: (!) 169/122 (!) 147/80 (!) 167/91 (!) 179/93  Pulse: 92 88 87 82  Resp: 17 18 16 18   Temp: 98 F (36.7 C)     TempSrc: Oral     SpO2: 95% 96% 97% 98%    Constitutional: Very pleasant female, calm, comfortable Vitals:   10/07/20 1116 10/07/20 1401 10/07/20 1557 10/07/20 1718  BP: (!) 169/122 (!) 147/80 (!) 167/91 (!) 179/93  Pulse: 92 88 87 82  Resp: 17 18 16 18   Temp: 98 F (36.7 C)     TempSrc: Oral     SpO2: 95% 96% 97% 98%   Eyes: PERRL, lids and conjunctivae normal ENMT: Mucous membranes are moist.  Neck: normal, supple, no masses, no thyromegaly Respiratory: clear to auscultation bilaterally, no wheezing, no crackles. Normal respiratory effort. No accessory muscle use.  Cardiovascular: Regular rate and rhythm, no murmurs / rubs / gallops. No extremity edema.  Abdomen: no tenderness, no masses palpated. No hepatosplenomegaly. Bowel sounds positive.  Musculoskeletal: no clubbing / cyanosis. No joint deformity upper and lower extremities.  Skin: no rashes, lesions, ulcers. No induration Neurologic: CN 2-12 grossly intact.  Strength 5/5 in all 4.  Psychiatric: Normal judgment and insight. Alert and oriented x 3. Normal mood.   Foley Catheter:None  Labs on Admission: I have personally reviewed following labs and imaging studies  CBC: Recent Labs  Lab 10/07/20 1548  WBC 8.1  NEUTROABS 6.3  HGB 13.0  HCT 40.4  MCV 96.4  PLT 834   Basic Metabolic Panel: Recent Labs  Lab 10/07/20 1548  NA 139  K 3.6  CL 102  CO2 26  GLUCOSE 96  BUN 25*  CREATININE 0.59  CALCIUM 9.3   GFR: CrCl cannot be calculated (Unknown ideal weight.). Liver Function Tests: No results for input(s): AST, ALT, ALKPHOS, BILITOT, PROT, ALBUMIN in the last 168 hours. No  results for input(s): LIPASE, AMYLASE in the last 168 hours. No results for input(s): AMMONIA in the last 168 hours. Coagulation Profile: No results for input(s): INR, PROTIME in the last 168 hours. Cardiac Enzymes: No results for input(s): CKTOTAL, CKMB, CKMBINDEX, TROPONINI in the last 168 hours. BNP (last 3 results) No results for input(s): PROBNP in the last 8760 hours. HbA1C: No results for input(s): HGBA1C in the last 72 hours. CBG: No results for input(s): GLUCAP in the last 168 hours. Lipid Profile: No results for input(s): CHOL, HDL, LDLCALC, TRIG, CHOLHDL, LDLDIRECT in the last 72 hours. Thyroid Function Tests: No results for input(s): TSH, T4TOTAL, FREET4, T3FREE, THYROIDAB in the last 72 hours. Anemia Panel: No results for input(s): VITAMINB12, FOLATE, FERRITIN, TIBC, IRON, RETICCTPCT in the last 72 hours. Urine analysis:    Component Value Date/Time   COLORURINE YELLOW 01/01/2018 0152   APPEARANCEUR HAZY (A) 01/01/2018 0152   LABSPEC 1.024 01/01/2018 0152   PHURINE 5.0 01/01/2018 0152   GLUCOSEU NEGATIVE 01/01/2018  [place] (A) 01/01/2018 [place] 01/01/2018 [place] 01/01/2018 0152   PROTEINUR NEGATIVE 01/01/2018 0152   NITRITE NEGATIVE 01/01/2018 0152   LEUKOCYTESUR MODERATE (A) 01/01/2018 0152    Radiological Exams on Admission: DG Chest 2 View  Result Date: 10/07/2020 CLINICAL DATA:  Cough. EXAM: CHEST - 2 VIEW COMPARISON:  None. FINDINGS: The heart size and mediastinal contours are within normal limits. Tortuous aorta. Both lungs are clear. Hyperinflation. No visible pleural effusions or pneumothorax. No acute osseous abnormality. IMPRESSION: 1. No active cardiopulmonary disease. 2. Hyperinflation. Electronically Signed   By: Margaretha Sheffield MD   On: 10/07/2020 11:48   MR Brain W and Wo Contrast  Result Date: 10/07/2020 CLINICAL DATA:  69 year old female with a history of left frontotemporal brain lesion since  2019, initially worked up for [place]. Status post craniotomy and biopsy March 2019 revealing "macrophage rich lesion". Recently, underlying etiology favored to be inflammatory rather than neoplastic, with greatest suspicion of neurosarcoidosis. New onset cough, hemoptysis. EXAM: MRI HEAD WITHOUT AND WITH CONTRAST TECHNIQUE: Multiplanar, multiecho pulse sequences of the brain and surrounding structures were obtained without and with intravenous contrast. CONTRAST:  9.33mL GADAVIST GADOBUTROL 1 MMOL/ML IV SOLN COMPARISON:  Brain MRI 04/19/2020 and earlier. Total spine MRI 05/23/2018. FINDINGS: Brain: Cerebral volume remains stable. No restricted diffusion to suggest acute infarction. No midline shift, mass effect, ventriculomegaly, extra-axial collection or acute intracranial hemorrhage. Pituitary within normal limits. However, there is confluent new abnormal T2 and FLAIR hyperintense signal in the lower brainstem and cervicomedullary junction (series 8, image 4 and series 11, image 6) and evidence that the abnormal signal continues into the cervical spinal cord (series 15, image 19 and series 7, image 13. Near complete involvement of the cervicomedullary junction, and right eccentric near holo cord involvement of the visible upper spinal cord. Minimal associated masslike expansion of the cervicomedullary junction. Associated new abnormal surface and to a lesser extent underlying parenchymal enhancement along the right posterolateral cervicomedullary junction (series 18, image 13 and series 16, image 13). Diffusion appears facilitated in the lesion. No evidence of hemorrhage. Elsewhere stable 9 mm dural-based solid enhancing lesion along the left inferior frontal convexity (series 16, image 87). Regional smooth dural thickening is stable. Regional T2 and FLAIR hyperintensity has regressed since June (series 11, image 29). Stable superimposed left anterior temporal lobe surgical cavity and regional T2/FLAIR signal  abnormality with no enhancement. Elsewhere gray and white matter signal remains stable throughout the brain and no other new or abnormal intracranial enhancement or dural thickening is identified. Vascular: Major intracranial vascular flow voids are stable. The major dural venous sinuses are enhancing and appear to be patent. Skull and upper cervical spine: Suspicion of abnormal signal now throughout much of the visible cervical spinal cord (as above). Superimposed upper cervical spine degeneration appears stable. Underlying bone marrow signal is stable and within normal limits. Previous left frontotemporal craniotomy. Sinuses/Orbits: Postoperative changes to both globes since June. Otherwise stable and negative. Other: Mastoids remain well aerated. Grossly normal visible internal auditory structures. IMPRESSION: 1. New since June extensive abnormal signal in the lower brainstem and visible spinal cord. Associated new abnormal enhancement, primarily surface enhancement along the right posterior cervicomedullary junction. Burtis Junes this is progression of the underlying chronic CNS inflammatory process. Follow-up Cervical and/or Total Spine MRI without and with contrast would be valuable and could be compared to the 2019 total spine MRI. 2. Elsewhere stable MRI appearance of the brain  since June - actually with some regression of abnormal FLAIR signal in the area of the chronic left inferior frontal gyrus lesion. Electronically Signed   By: Genevie Ann M.D.   On: 10/07/2020 15:18     Assessment/Plan Active Problems:   Neurosarcoidosis   Exacerbation of neurosarcoidosis: MRI finding as above.  Neuro oncology recommending 5 days course of IV Solu-Medrol.  Neuro oncology will continue to follow here.  Continue Protonix along with Solu-Medrol. Patient does not have any focal neurological deficits. Patient takes [place] for seizure prophylaxis at home and to be continued. Plan is to do MRI of the cervical/lumbar and  thoracic spine for further investigation,these will be done tomorrow to minimize contrast exposure.   Upper respiratory symptoms/sinus headache: Continue supportive care.  Continue Flonase, Claritin, Tylenol for headache  Hypertension: Noted to be hypertensive in the emergency department.  Takes Cardizem at home which will be continued.  Blood pressure will be monitored.   Severity of Illness: The appropriate patient status for this patient is OBSERVATION.     DVT prophylaxis: Lovenox Code Status: Full Family Communication: None at bedside Consults called: Neurooncology     Shelly Coss MD [facility]  10/07/2020, 5:45 PM

## 2020-10-08 ENCOUNTER — Observation Stay (HOSPITAL_COMMUNITY): Payer: Medicare Other

## 2020-10-08 DIAGNOSIS — G9519 Other vascular myelopathies: Secondary | ICD-10-CM | POA: Diagnosis not present

## 2020-10-08 DIAGNOSIS — M47816 Spondylosis without myelopathy or radiculopathy, lumbar region: Secondary | ICD-10-CM | POA: Diagnosis not present

## 2020-10-08 DIAGNOSIS — M48061 Spinal stenosis, lumbar region without neurogenic claudication: Secondary | ICD-10-CM | POA: Diagnosis present

## 2020-10-08 DIAGNOSIS — Z20822 Contact with and (suspected) exposure to covid-19: Secondary | ICD-10-CM | POA: Diagnosis present

## 2020-10-08 DIAGNOSIS — R519 Headache, unspecified: Secondary | ICD-10-CM | POA: Diagnosis present

## 2020-10-08 DIAGNOSIS — I10 Essential (primary) hypertension: Secondary | ICD-10-CM | POA: Diagnosis present

## 2020-10-08 DIAGNOSIS — G9589 Other specified diseases of spinal cord: Secondary | ICD-10-CM | POA: Diagnosis not present

## 2020-10-08 DIAGNOSIS — G9389 Other specified disorders of brain: Secondary | ICD-10-CM | POA: Diagnosis present

## 2020-10-08 DIAGNOSIS — D8689 Sarcoidosis of other sites: Secondary | ICD-10-CM

## 2020-10-08 DIAGNOSIS — G373 Acute transverse myelitis in demyelinating disease of central nervous system: Secondary | ICD-10-CM | POA: Diagnosis present

## 2020-10-08 DIAGNOSIS — R2689 Other abnormalities of gait and mobility: Secondary | ICD-10-CM | POA: Diagnosis present

## 2020-10-08 DIAGNOSIS — Z888 Allergy status to other drugs, medicaments and biological substances status: Secondary | ICD-10-CM | POA: Diagnosis not present

## 2020-10-08 DIAGNOSIS — I69331 Monoplegia of upper limb following cerebral infarction affecting right dominant side: Secondary | ICD-10-CM | POA: Diagnosis not present

## 2020-10-08 DIAGNOSIS — J069 Acute upper respiratory infection, unspecified: Secondary | ICD-10-CM | POA: Diagnosis present

## 2020-10-08 DIAGNOSIS — Z7982 Long term (current) use of aspirin: Secondary | ICD-10-CM | POA: Diagnosis not present

## 2020-10-08 DIAGNOSIS — M4802 Spinal stenosis, cervical region: Secondary | ICD-10-CM | POA: Diagnosis not present

## 2020-10-08 DIAGNOSIS — Z8249 Family history of ischemic heart disease and other diseases of the circulatory system: Secondary | ICD-10-CM | POA: Diagnosis not present

## 2020-10-08 DIAGNOSIS — Z87891 Personal history of nicotine dependence: Secondary | ICD-10-CM | POA: Diagnosis not present

## 2020-10-08 DIAGNOSIS — Z79899 Other long term (current) drug therapy: Secondary | ICD-10-CM | POA: Diagnosis not present

## 2020-10-08 DIAGNOSIS — I69398 Other sequelae of cerebral infarction: Secondary | ICD-10-CM | POA: Diagnosis not present

## 2020-10-08 LAB — BASIC METABOLIC PANEL
Anion gap: 11 (ref 5–15)
BUN: 25 mg/dL — ABNORMAL HIGH (ref 8–23)
CO2: 23 mmol/L (ref 22–32)
Calcium: 9.3 mg/dL (ref 8.9–10.3)
Chloride: 101 mmol/L (ref 98–111)
Creatinine, Ser: 0.53 mg/dL (ref 0.44–1.00)
GFR, Estimated: >60 mL/min (ref >60)
Glucose, Bld: 184 mg/dL — ABNORMAL HIGH (ref 70–99)
Potassium: 3.9 mmol/L (ref 3.5–5.1)
Sodium: 135 mmol/L (ref 135–145)

## 2020-10-08 LAB — CBC
HCT: 41 % (ref 36.0–46.0)
Hemoglobin: 13.5 g/dL (ref 12.0–15.0)
MCH: 31.3 pg (ref 26.0–34.0)
MCHC: 32.9 g/dL (ref 30.0–36.0)
MCV: 94.9 fL (ref 80.0–100.0)
Platelets: 387 10*3/uL (ref 150–400)
RBC: 4.32 MIL/uL (ref 3.87–5.11)
RDW: 12 % (ref 11.5–15.5)
WBC: 6.4 10*3/uL (ref 4.0–10.5)
nRBC: 0 % (ref 0.0–0.2)

## 2020-10-08 MED ORDER — GADOBUTROL 1 MMOL/ML IV SOLN
9.5000 mL | Freq: Once | INTRAVENOUS | Status: AC | PRN
  Administered 2020-10-08: 9.5 mL via INTRAVENOUS

## 2020-10-08 MED ORDER — SODIUM CHLORIDE 0.9 % IV SOLN
INTRAVENOUS | Status: DC | PRN
  Administered 2020-10-08 – 2020-10-09 (×2): 500 mL via INTRAVENOUS

## 2020-10-08 NOTE — Consult Note (Signed)
[facility] Consult Note  Patient Care Team: Forrest Moron, MD as PCP - General (Internal Medicine)  CHIEF COMPLAINTS/PURPOSE OF CONSULTATION:  Neurosarcoidosis  HISTORY OF PRESENTING ILLNESS:  Tara [name] 69 y.o. female presented with several days vague upper respiratory complaints, in addition to new right sided headache.  She denies new or focal neurologic deficit, or any other neurologic phenomena associated with the headache.  MRI brain was performed in ED which demonstrated new enhancing lesion within the posterior medulla.  She denies seizures, balance issues, urinary urgency.  MEDICAL HISTORY:  History reviewed. No pertinent past medical history.  SURGICAL HISTORY: Past Surgical History:  Procedure Laterality Date  . APPLICATION OF CRANIAL NAVIGATION N/A 01/04/2018   Procedure: APPLICATION OF CRANIAL NAVIGATION;  Surgeon: Erline Levine, MD;  Location: [facility];  Service: Neurosurgery;  Laterality: N/A;  . BRAIN SURGERY    . PR DURAL GRAFT REPAIR,SPINE DEFECT Left 01/04/2018   Procedure: Left Pterional craniotomy for biopsy with Brainlab;  Surgeon: Erline Levine, MD;  Location: [facility];  Service: Neurosurgery;  Laterality: Left;  Left Pterional craniotomy for biopsy with Brainlab    SOCIAL HISTORY: Social History   Socioeconomic History  . Marital status: Married    Spouse name: Not on file  . Number of children: Not on file  . Years of education: Not on file  . Highest education level: Not on file  Occupational History  . Not on file  Tobacco Use  . Smoking status: Former Smoker    Packs/day: 1.00    Types: Cigarettes  . Smokeless tobacco: Never Used  . Tobacco comment: plans to quit now  Vaping Use  . Vaping Use: Never used  Substance and Sexual Activity  . Alcohol use: No  . Drug use: No  . Sexual activity: Yes  Other Topics Concern  . Not on file  Social History Narrative  . Not on file   Social Determinants of Health    Financial Resource Strain:   . Difficulty of Paying Living Expenses: Not on file  Food Insecurity:   . Worried About Charity fundraiser in the Last Year: Not on file  . Ran Out of Food in the Last Year: Not on file  Transportation Needs:   . Lack of Transportation (Medical): Not on file  . Lack of Transportation (Non-Medical): Not on file  Physical Activity:   . Days of Exercise per Week: Not on file  . Minutes of Exercise per Session: Not on file  Stress:   . Feeling of Stress : Not on file  Social Connections:   . Frequency of Communication with Friends and Family: Not on file  . Frequency of Social Gatherings with Friends and Family: Not on file  . Attends Religious Services: Not on file  . Active Member of Clubs or Organizations: Not on file  . Attends Archivist Meetings: Not on file  . Marital Status: Not on file  Intimate Partner Violence:   . Fear of Current or Ex-Partner: Not on file  . Emotionally Abused: Not on file  . Physically Abused: Not on file  . Sexually Abused: Not on file    FAMILY HISTORY: Family History  Problem Relation Age of Onset  . Colon cancer Mother   . CAD Father   . Cancer Father   . Diabetes Father   . Heart disease Father   . Hypertension Father   . Diabetes Sister   . Diabetes Brother  ALLERGIES:  is allergic to ace inhibitors and ether.  MEDICATIONS:  Current Facility-Administered Medications  Medication Dose Route Frequency Provider Last Rate Last Admin  . 0.9 %  sodium chloride infusion   Intravenous PRN Wynonia Musty, RN   Stopped at 10/08/20 1105  . acetaminophen (TYLENOL) tablet 650 mg  650 mg Oral Q6H PRN Shelly Coss, MD   650 mg at 10/08/20 1107  . aspirin EC tablet 81 mg  81 mg Oral Daily Adhikari, Amrit, MD      . diltiazem (CARDIZEM CD) 24 hr capsule 180 mg  180 mg Oral Daily Adhikari, Amrit, MD      . enoxaparin (LOVENOX) injection 40 mg  40 mg Subcutaneous Q24H Adhikari, Amrit, MD      .  fluticasone (FLONASE) 50 MCG/ACT nasal spray 1 spray  1 spray Each Nare Daily Shelly Coss, MD   1 spray at 10/07/20 2110  . levETIRAcetam (KEPPRA) tablet 250 mg  250 mg Oral BID Shelly Coss, MD      . loratadine (CLARITIN) tablet 10 mg  10 mg Oral Daily Adhikari, Amrit, MD      . methylPREDNISolone sodium succinate (SOLU-MEDROL) 1,000 mg in sodium chloride 0.9 % 50 mL IVPB  1,000 mg Intravenous Q24H Shelly Coss, MD 58 mL/hr at 10/08/20 0857 1,000 mg at 10/08/20 0857  . multivitamin with minerals tablet 1 tablet  1 tablet Oral Daily Adhikari, Amrit, MD      . pantoprazole (PROTONIX) EC tablet 40 mg  40 mg Oral Daily Adhikari, Amrit, MD        REVIEW OF SYSTEMS:   Constitutional: Denies fevers, chills or abnormal weight loss Eyes: Denies blurriness of vision Ears, nose, mouth, throat, and face: Denies mucositis or sore throat Respiratory: Denies cough, dyspnea or wheezes Cardiovascular: Denies palpitation, chest discomfort or lower extremity swelling Gastrointestinal:  Denies nausea, constipation, diarrhea GU: Denies dysuria or incontinence Skin: Denies abnormal skin rashes Neurological: Per HPI Musculoskeletal: Denies joint pain, back or neck discomfort. No decrease in ROM Behavioral/Psych: Denies anxiety, disturbance in thought content, and mood instability   PHYSICAL EXAMINATION: Vitals:   10/08/20 0536 10/08/20 1539  BP: (!) 143/74 (!) 155/89  Pulse: 76 66  Resp: 16 18  Temp: 97.6 F (36.4 C) 98 F (36.7 C)  SpO2: 93% 96%   KPS: 90. General: Alert, cooperative, pleasant, in no acute distress Head: Normal EENT: No conjunctival injection or scleral icterus. Oral mucosa moist Lungs: Resp effort normal Cardiac: Regular rate and rhythm Abdomen: Soft, non-distended abdomen Skin: No rashes cyanosis or petechiae. Extremities: No clubbing or edema  NEUROLOGIC EXAM: Mental Status: Awake, alert, attentive to examiner. Oriented to self and environment. Language is fluent  with intact comprehension.  Cranial Nerves: Visual acuity is grossly normal. Visual fields are full. Extra-ocular movements intact. No ptosis. Face is symmetric, tongue midline. Motor: Tone and bulk are normal. Power is full in both arms and legs. Reflexes are symmetric, no pathologic reflexes present. Intact finger to nose bilaterally Sensory: Intact to light touch and temperature Gait: Normal and tandem gait is normal.   LABORATORY DATA:  I have reviewed the data as listed Lab Results  Component Value Date   WBC 6.4 10/08/2020   HGB 13.5 10/08/2020   HCT 41.0 10/08/2020   MCV 94.9 10/08/2020   PLT 387 10/08/2020   Recent Labs    12/05/19 1059 10/07/20 1548 10/08/20 0322  NA 142 139 135  K 4.4 3.6 3.9  CL 103 102 101  CO2 23 26 23   GLUCOSE 99 96 184*  BUN 21 25* 25*  CREATININE 0.65 0.59 0.53  CALCIUM 9.4 9.3 9.3  GFRNONAA 92 >60 >60  GFRAA 105  --   --   PROT 7.1  --   --   ALBUMIN 4.6  --   --   AST 14  --   --   ALT 16  --   --   ALKPHOS 167*  --   --   BILITOT 0.3  --   --     RADIOGRAPHIC STUDIES: I have personally reviewed the radiological images as listed and agreed with the findings in the report. DG Chest 2 View  Result Date: 10/07/2020 CLINICAL DATA:  Cough. EXAM: CHEST - 2 VIEW COMPARISON:  None. FINDINGS: The heart size and mediastinal contours are within normal limits. Tortuous aorta. Both lungs are clear. Hyperinflation. No visible pleural effusions or pneumothorax. No acute osseous abnormality. IMPRESSION: 1. No active cardiopulmonary disease. 2. Hyperinflation. Electronically Signed   By: Margaretha Sheffield MD   On: 10/07/2020 11:48   MR Brain W and Wo Contrast  Result Date: 10/07/2020 CLINICAL DATA:  70 year old female with a history of left frontotemporal brain lesion since 2019, initially worked up for [facility]. Status post craniotomy and biopsy March 2019 revealing "macrophage rich lesion". Recently, underlying etiology favored to be  inflammatory rather than neoplastic, with greatest suspicion of neurosarcoidosis. New onset cough, hemoptysis. EXAM: MRI HEAD WITHOUT AND WITH CONTRAST TECHNIQUE: Multiplanar, multiecho pulse sequences of the brain and surrounding structures were obtained without and with intravenous contrast. CONTRAST:  9.29mL GADAVIST GADOBUTROL 1 MMOL/ML IV SOLN COMPARISON:  Brain MRI 04/19/2020 and earlier. Total spine MRI 05/23/2018. FINDINGS: Brain: Cerebral volume remains stable. No restricted diffusion to suggest acute infarction. No midline shift, mass effect, ventriculomegaly, extra-axial collection or acute intracranial hemorrhage. Pituitary within normal limits. However, there is confluent new abnormal T2 and FLAIR hyperintense signal in the lower brainstem and cervicomedullary junction (series 8, image 4 and series 11, image 6) and evidence that the abnormal signal continues into the cervical spinal cord (series 15, image 19 and series 7, image 13. Near complete involvement of the cervicomedullary junction, and right eccentric near holo cord involvement of the visible upper spinal cord. Minimal associated masslike expansion of the cervicomedullary junction. Associated new abnormal surface and to a lesser extent underlying parenchymal enhancement along the right posterolateral cervicomedullary junction (series 18, image 13 and series 16, image 13). Diffusion appears facilitated in the lesion. No evidence of hemorrhage. Elsewhere stable 9 mm dural-based solid enhancing lesion along the left inferior frontal convexity (series 16, image 87). Regional smooth dural thickening is stable. Regional T2 and FLAIR hyperintensity has regressed since June (series 11, image 29). Stable superimposed left anterior temporal lobe surgical cavity and regional T2/FLAIR signal abnormality with no enhancement. Elsewhere gray and white matter signal remains stable throughout the brain and no other new or abnormal intracranial enhancement or  dural thickening is identified. Vascular: Major intracranial vascular flow voids are stable. The major dural venous sinuses are enhancing and appear to be patent. Skull and upper cervical spine: Suspicion of abnormal signal now throughout much of the visible cervical spinal cord (as above). Superimposed upper cervical spine degeneration appears stable. Underlying bone marrow signal is stable and within normal limits. Previous left frontotemporal craniotomy. Sinuses/Orbits: Postoperative changes to both globes since June. Otherwise stable and negative. Other: Mastoids remain well aerated. Grossly normal visible internal auditory structures. IMPRESSION: 1. New  since June extensive abnormal signal in the lower brainstem and visible spinal cord. Associated new abnormal enhancement, primarily surface enhancement along the right posterior cervicomedullary junction. Burtis Junes this is progression of the underlying chronic CNS inflammatory process. Follow-up Cervical and/or Total Spine MRI without and with contrast would be valuable and could be compared to the 2019 total spine MRI. 2. Elsewhere stable MRI appearance of the brain since June - actually with some regression of abnormal FLAIR signal in the area of the chronic left inferior frontal gyrus lesion. Electronically Signed   By: Genevie Ann M.D.   On: 10/07/2020 15:18   MR CERVICAL SPINE W WO CONTRAST  Result Date: 10/08/2020 CLINICAL DATA:  History of left frontal brain lesion suspected to be inflammatory in etiology, particularly neurosarcoidosis, over neoplasm. Brain MRI yesterday demonstrating signal abnormality in the lower brainstem and visible cervical spinal cord. EXAM: MRI CERVICAL, THORACIC AND LUMBAR SPINE WITHOUT AND WITH CONTRAST TECHNIQUE: Multiplanar and multiecho pulse sequences of the cervical spine, to include the craniocervical junction and cervicothoracic junction, and thoracic and lumbar spine, were obtained without and with intravenous contrast.  CONTRAST:  9.23mL GADAVIST GADOBUTROL 1 MMOL/ML IV SOLN COMPARISON:  Total spine MRI 05/23/2018 FINDINGS: MRI CERVICAL SPINE FINDINGS The sagittal T2 sequence is moderately motion degraded. Alignment: Chronic cervical spine straightening with trace anterolisthesis of C4 on C5 and trace retrolisthesis of C3 on C4. Vertebrae: No fracture or suspicious osseous lesion. Degenerative endplate changes from W1-X9 including mild degenerative endplate edema and enhancement at C3-4. Cord: Extensive T2 hyperintensity/edema extending from the medulla inferiorly throughout much of the cervical spinal cord to the C7 level with cord enlargement. 11 mm curvilinear focus of plaque-like enhancement along the right dorsal spinal cord at the cervicomedullary junction. No abnormal enhancement elsewhere in the cervical spinal cord. Posterior Fossa, vertebral arteries, paraspinal tissues: Preserved vertebral artery flow voids. Unremarkable paraspinal soft tissues. Disc levels: C2-3: Mild-to-moderate right facet arthrosis results in mild right neural foraminal stenosis without spinal stenosis. C3-4: Retrolisthesis, broad-based posterior disc osteophyte complex, and moderate facet arthrosis result in moderate spinal stenosis with mild cord flattening and moderate right greater than left neural foraminal stenosis. C4-5: Anterolisthesis with disc uncovering, a small left paracentral disc protrusion, and mild facet arthrosis result in mild spinal stenosis with slight ventral cord flattening and moderate right and mild left neural foraminal stenosis. C5-6: A right paracentral disc osteophyte complex and bilateral uncovertebral spurring result in mild spinal stenosis and mild-to-moderate bilateral neural foraminal stenosis. C6-7: A broad-based posterior disc osteophyte complex results in mild spinal stenosis and mild-to-moderate right and moderate to severe left neural foraminal stenosis. C7-T1: Mild facet arthrosis without disc herniation or  stenosis. The lack of axial images through the cervical spine on the prior MRI limits comparison, however degenerative changes have progressed at C3-4. MRI THORACIC SPINE FINDINGS The study is intermittently up to moderately motion degraded. Alignment: Normal. Vertebrae: No fracture or suspicious osseous lesion. Numerous small Schmorl's nodes. Multilevel degenerative endplate changes with only minimal scattered degenerative endplate edema. Cord: Normal signal and morphology. No abnormal intradural enhancement. Paraspinal and other soft tissues: Partially visualized left renal cyst. Disc levels: Small central disc protrusion at T11-12. No thoracic spinal stenosis. Mild-to-moderate multilevel thoracic facet arthrosis with mild right neural foraminal stenosis at T6-7. MRI LUMBAR SPINE FINDINGS Segmentation:  Standard. Alignment: Facet mediated grade 1 anterolisthesis of L4 on L5 and L5 on S1, unchanged. Vertebrae: No fracture or suspicious osseous lesion. Mild multilevel degenerative endplate changes with at most minimal  degenerative edema. Conus medullaris and cauda equina: Conus extends to the upper L1 level. Conus and cauda equina appear normal. Paraspinal and other soft tissues: Partially visualized left renal cyst. 2 cm gallstone. Disc levels: Disc desiccation throughout the lumbar spine. Mild disc space narrowing throughout the lumbar spine with exception of preserved height at L3-4. L1-2: Disc bulging and mild facet arthrosis without stenosis. L2-3: Disc bulging mildly eccentric to the right and moderate facet arthrosis result in borderline to mild spinal stenosis, mild right lateral recess stenosis, and mild right neural foraminal stenosis. L3-4: Mild disc bulging and mild facet arthrosis without stenosis. L4-5: Anterolisthesis with bulging uncovered disc, ligamentum flavum hypertrophy, and severe facet hypertrophy result in moderate spinal stenosis, severe bilateral lateral recess stenosis, and moderate right  and moderate to severe left neural foraminal stenosis. Potential left L4 and bilateral L5 nerve root impingement. L5-S1: Anterolisthesis with disc uncovering and severe facet arthrosis without stenosis. IMPRESSION: 1. Extensive T2 hyperintensity/edema extending from the medulla inferiorly throughout most of the cervical spinal cord with cord expansion and a small region of plaque-like enhancement along the cord surface at the cervicomedullary junction. This presumably reflects involvement by the patient's underlying CNS inflammatory process, and sarcoidosis could have this appearance as well as other causes of transverse myelitis. Neoplasm is considered less likely. 2. No evidence of thoracic or lumbar spine involvement. 3. Multilevel cervical disc and facet degeneration, progressed at C3-4 where there is moderate spinal and neural foraminal stenosis. 4. Severe lower lumbar facet arthrosis with grade 1 anterolisthesis, severe spinal stenosis, and moderate to severe neural foraminal stenosis at L4-5. Electronically Signed   By: Logan Bores M.D.   On: 10/08/2020 15:17   MR THORACIC SPINE W WO CONTRAST  Result Date: 10/08/2020 CLINICAL DATA:  History of left frontal brain lesion suspected to be inflammatory in etiology, particularly neurosarcoidosis, over neoplasm. Brain MRI yesterday demonstrating signal abnormality in the lower brainstem and visible cervical spinal cord. EXAM: MRI CERVICAL, THORACIC AND LUMBAR SPINE WITHOUT AND WITH CONTRAST TECHNIQUE: Multiplanar and multiecho pulse sequences of the cervical spine, to include the craniocervical junction and cervicothoracic junction, and thoracic and lumbar spine, were obtained without and with intravenous contrast. CONTRAST:  9.18mL GADAVIST GADOBUTROL 1 MMOL/ML IV SOLN COMPARISON:  Total spine MRI 05/23/2018 FINDINGS: MRI CERVICAL SPINE FINDINGS The sagittal T2 sequence is moderately motion degraded. Alignment: Chronic cervical spine straightening with trace  anterolisthesis of C4 on C5 and trace retrolisthesis of C3 on C4. Vertebrae: No fracture or suspicious osseous lesion. Degenerative endplate changes from V6-H6 including mild degenerative endplate edema and enhancement at C3-4. Cord: Extensive T2 hyperintensity/edema extending from the medulla inferiorly throughout much of the cervical spinal cord to the C7 level with cord enlargement. 11 mm curvilinear focus of plaque-like enhancement along the right dorsal spinal cord at the cervicomedullary junction. No abnormal enhancement elsewhere in the cervical spinal cord. Posterior Fossa, vertebral arteries, paraspinal tissues: Preserved vertebral artery flow voids. Unremarkable paraspinal soft tissues. Disc levels: C2-3: Mild-to-moderate right facet arthrosis results in mild right neural foraminal stenosis without spinal stenosis. C3-4: Retrolisthesis, broad-based posterior disc osteophyte complex, and moderate facet arthrosis result in moderate spinal stenosis with mild cord flattening and moderate right greater than left neural foraminal stenosis. C4-5: Anterolisthesis with disc uncovering, a small left paracentral disc protrusion, and mild facet arthrosis result in mild spinal stenosis with slight ventral cord flattening and moderate right and mild left neural foraminal stenosis. C5-6: A right paracentral disc osteophyte complex and bilateral uncovertebral spurring  result in mild spinal stenosis and mild-to-moderate bilateral neural foraminal stenosis. C6-7: A broad-based posterior disc osteophyte complex results in mild spinal stenosis and mild-to-moderate right and moderate to severe left neural foraminal stenosis. C7-T1: Mild facet arthrosis without disc herniation or stenosis. The lack of axial images through the cervical spine on the prior MRI limits comparison, however degenerative changes have progressed at C3-4. MRI THORACIC SPINE FINDINGS The study is intermittently up to moderately motion degraded. Alignment:  Normal. Vertebrae: No fracture or suspicious osseous lesion. Numerous small Schmorl's nodes. Multilevel degenerative endplate changes with only minimal scattered degenerative endplate edema. Cord: Normal signal and morphology. No abnormal intradural enhancement. Paraspinal and other soft tissues: Partially visualized left renal cyst. Disc levels: Small central disc protrusion at T11-12. No thoracic spinal stenosis. Mild-to-moderate multilevel thoracic facet arthrosis with mild right neural foraminal stenosis at T6-7. MRI LUMBAR SPINE FINDINGS Segmentation:  Standard. Alignment: Facet mediated grade 1 anterolisthesis of L4 on L5 and L5 on S1, unchanged. Vertebrae: No fracture or suspicious osseous lesion. Mild multilevel degenerative endplate changes with at most minimal degenerative edema. Conus medullaris and cauda equina: Conus extends to the upper L1 level. Conus and cauda equina appear normal. Paraspinal and other soft tissues: Partially visualized left renal cyst. 2 cm gallstone. Disc levels: Disc desiccation throughout the lumbar spine. Mild disc space narrowing throughout the lumbar spine with exception of preserved height at L3-4. L1-2: Disc bulging and mild facet arthrosis without stenosis. L2-3: Disc bulging mildly eccentric to the right and moderate facet arthrosis result in borderline to mild spinal stenosis, mild right lateral recess stenosis, and mild right neural foraminal stenosis. L3-4: Mild disc bulging and mild facet arthrosis without stenosis. L4-5: Anterolisthesis with bulging uncovered disc, ligamentum flavum hypertrophy, and severe facet hypertrophy result in moderate spinal stenosis, severe bilateral lateral recess stenosis, and moderate right and moderate to severe left neural foraminal stenosis. Potential left L4 and bilateral L5 nerve root impingement. L5-S1: Anterolisthesis with disc uncovering and severe facet arthrosis without stenosis. IMPRESSION: 1. Extensive T2 hyperintensity/edema  extending from the medulla inferiorly throughout most of the cervical spinal cord with cord expansion and a small region of plaque-like enhancement along the cord surface at the cervicomedullary junction. This presumably reflects involvement by the patient's underlying CNS inflammatory process, and sarcoidosis could have this appearance as well as other causes of transverse myelitis. Neoplasm is considered less likely. 2. No evidence of thoracic or lumbar spine involvement. 3. Multilevel cervical disc and facet degeneration, progressed at C3-4 where there is moderate spinal and neural foraminal stenosis. 4. Severe lower lumbar facet arthrosis with grade 1 anterolisthesis, severe spinal stenosis, and moderate to severe neural foraminal stenosis at L4-5. Electronically Signed   By: Logan Bores M.D.   On: 10/08/2020 15:17   MR Lumbar Spine W Wo Contrast  Result Date: 10/08/2020 CLINICAL DATA:  History of left frontal brain lesion suspected to be inflammatory in etiology, particularly neurosarcoidosis, over neoplasm. Brain MRI yesterday demonstrating signal abnormality in the lower brainstem and visible cervical spinal cord. EXAM: MRI CERVICAL, THORACIC AND LUMBAR SPINE WITHOUT AND WITH CONTRAST TECHNIQUE: Multiplanar and multiecho pulse sequences of the cervical spine, to include the craniocervical junction and cervicothoracic junction, and thoracic and lumbar spine, were obtained without and with intravenous contrast. CONTRAST:  9.31mL GADAVIST GADOBUTROL 1 MMOL/ML IV SOLN COMPARISON:  Total spine MRI 05/23/2018 FINDINGS: MRI CERVICAL SPINE FINDINGS The sagittal T2 sequence is moderately motion degraded. Alignment: Chronic cervical spine straightening with trace anterolisthesis of C4 on  C5 and trace retrolisthesis of C3 on C4. Vertebrae: No fracture or suspicious osseous lesion. Degenerative endplate changes from E2-A8 including mild degenerative endplate edema and enhancement at C3-4. Cord: Extensive T2  hyperintensity/edema extending from the medulla inferiorly throughout much of the cervical spinal cord to the C7 level with cord enlargement. 11 mm curvilinear focus of plaque-like enhancement along the right dorsal spinal cord at the cervicomedullary junction. No abnormal enhancement elsewhere in the cervical spinal cord. Posterior Fossa, vertebral arteries, paraspinal tissues: Preserved vertebral artery flow voids. Unremarkable paraspinal soft tissues. Disc levels: C2-3: Mild-to-moderate right facet arthrosis results in mild right neural foraminal stenosis without spinal stenosis. C3-4: Retrolisthesis, broad-based posterior disc osteophyte complex, and moderate facet arthrosis result in moderate spinal stenosis with mild cord flattening and moderate right greater than left neural foraminal stenosis. C4-5: Anterolisthesis with disc uncovering, a small left paracentral disc protrusion, and mild facet arthrosis result in mild spinal stenosis with slight ventral cord flattening and moderate right and mild left neural foraminal stenosis. C5-6: A right paracentral disc osteophyte complex and bilateral uncovertebral spurring result in mild spinal stenosis and mild-to-moderate bilateral neural foraminal stenosis. C6-7: A broad-based posterior disc osteophyte complex results in mild spinal stenosis and mild-to-moderate right and moderate to severe left neural foraminal stenosis. C7-T1: Mild facet arthrosis without disc herniation or stenosis. The lack of axial images through the cervical spine on the prior MRI limits comparison, however degenerative changes have progressed at C3-4. MRI THORACIC SPINE FINDINGS The study is intermittently up to moderately motion degraded. Alignment: Normal. Vertebrae: No fracture or suspicious osseous lesion. Numerous small Schmorl's nodes. Multilevel degenerative endplate changes with only minimal scattered degenerative endplate edema. Cord: Normal signal and morphology. No abnormal  intradural enhancement. Paraspinal and other soft tissues: Partially visualized left renal cyst. Disc levels: Small central disc protrusion at T11-12. No thoracic spinal stenosis. Mild-to-moderate multilevel thoracic facet arthrosis with mild right neural foraminal stenosis at T6-7. MRI LUMBAR SPINE FINDINGS Segmentation:  Standard. Alignment: Facet mediated grade 1 anterolisthesis of L4 on L5 and L5 on S1, unchanged. Vertebrae: No fracture or suspicious osseous lesion. Mild multilevel degenerative endplate changes with at most minimal degenerative edema. Conus medullaris and cauda equina: Conus extends to the upper L1 level. Conus and cauda equina appear normal. Paraspinal and other soft tissues: Partially visualized left renal cyst. 2 cm gallstone. Disc levels: Disc desiccation throughout the lumbar spine. Mild disc space narrowing throughout the lumbar spine with exception of preserved height at L3-4. L1-2: Disc bulging and mild facet arthrosis without stenosis. L2-3: Disc bulging mildly eccentric to the right and moderate facet arthrosis result in borderline to mild spinal stenosis, mild right lateral recess stenosis, and mild right neural foraminal stenosis. L3-4: Mild disc bulging and mild facet arthrosis without stenosis. L4-5: Anterolisthesis with bulging uncovered disc, ligamentum flavum hypertrophy, and severe facet hypertrophy result in moderate spinal stenosis, severe bilateral lateral recess stenosis, and moderate right and moderate to severe left neural foraminal stenosis. Potential left L4 and bilateral L5 nerve root impingement. L5-S1: Anterolisthesis with disc uncovering and severe facet arthrosis without stenosis. IMPRESSION: 1. Extensive T2 hyperintensity/edema extending from the medulla inferiorly throughout most of the cervical spinal cord with cord expansion and a small region of plaque-like enhancement along the cord surface at the cervicomedullary junction. This presumably reflects  involvement by the patient's underlying CNS inflammatory process, and sarcoidosis could have this appearance as well as other causes of transverse myelitis. Neoplasm is considered less likely. 2. No evidence of thoracic  or lumbar spine involvement. 3. Multilevel cervical disc and facet degeneration, progressed at C3-4 where there is moderate spinal and neural foraminal stenosis. 4. Severe lower lumbar facet arthrosis with grade 1 anterolisthesis, severe spinal stenosis, and moderate to severe neural foraminal stenosis at L4-5. Electronically Signed   By: Logan Bores M.D.   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    Because of lack of focal symptoms, can complete third dose tomorrow and discharge with 7 days course of dexamethasone: 4mg  BID x3 days, then 4mg  daily x4 days.    We will give her a call next to week to insure no concerning or localizing clinical changes have developed in the interim.  Because of brainstem and spine involvement, consideration will be given for immune modulation in the near future.  Referral will be placed to [facility] neuroimmunology for consultation and evaluation.    All questions were answered. The patient knows to call the clinic with any problems, questions or concerns.  The total time spent in the encounter was 55 minutes and more than 50% was on counseling and review of test results     [name], MD 10/08/2020 5:01 PM

## 2020-10-08 NOTE — Progress Notes (Signed)
PROGRESS NOTE    Tara [name]  IRC:[id number] DOB: [birth year] DOA: 10/07/2020 PCP: Forrest Moron, MD   Chief Complain: ID, runny nose  Brief Narrative:  Tara [name] is a 69 y.o. 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 Mickeal Skinner ,who presents to the emergency department for the evaluation of runny nose, cough.Patient was planned for follow-up MRI soon as per neuro-oncology.  Since patient presented to the hospital  , decision was made to do the MRI here which showed possible relapse/exacerbation of neurosarcoidosis.  Neuro oncology recommended to admit the patient to complete 5 days course of IV Solu-Medrol.  Assessment & Plan:   Active Problems:   Neurosarcoidosis   Exacerbation of neurosarcoidosis: MRI showed extensive abnormal signal in the lower brainstem and visible spinal cord,associated new abnormal enhancement, primarily surface enhancement along the right posterior cervicomedullary junction .  Neuro oncology recommending 5 days course of IV Solu-Medrol.day 2/5.  Neuro oncology will continue to follow here.  Continue Protonix along with Solu-Medrol. Patient does not have any focal neurological deficits. Patient takes Plummer for seizure prophylaxis at home and to be continued. Plan is to do MRI of the cervical/lumbar and thoracic spine for further investigation. Case was extensively discussed with Dr. Mickeal Skinner, neuro oncology.  Upper respiratory symptoms/sinus headache: Continue supportive care.  Continue Flonase, Claritin, Tylenol for headache  Hypertension:  Takes Cardizem at home which will be continued.  Blood pressure will be monitored.         DVT prophylaxis:Lovenox Code Status: Full Family Communication: None at bed side Status is: Observation   Dispo: The patient is from: Home              Anticipated d/c is to: Home              Anticipated d/c date is:3 days              Patient  currently is not medically stable for discharge  We will complete 5 days course of Solu-Medrol on Friday when she can be discharged.   Consultants: Neuro-oncology  Procedures: None  Antimicrobials:  Anti-infectives (From admission, onward)   None      Subjective:  Patient seen and examined at the bedside this morning.  Hemodynamically stable.  Comfortable.  Denies any complaints.  She could not sleep last night because of noises and alarms.  Her respiratory symptoms are much better today.  Objective: Vitals:   10/07/20 2130 10/07/20 2239 10/08/20 0039 10/08/20 0536  BP: (!) 145/74 (!) 152/82 (!) 141/81 (!) 143/74  Pulse: 76 83 75 76  Resp: 20 18 17 16   Temp:   97.7 F (36.5 C) 97.6 F (36.4 C)  TempSrc:   Oral   SpO2: 95% 96% 94% 93%    Intake/Output Summary (Last 24 hours) at 10/08/2020 0745 Last data filed at 10/07/2020 1845 Gross per 24 hour  Intake 50 ml  Output --  Net 50 ml   There were no vitals filed for this visit.  Examination:  General exam: Appears calm and comfortable ,Not in distress,average built HEENT:PERRL,Oral mucosa moist, Ear/Nose normal on gross exam Respiratory system: Bilateral equal air entry, normal vesicular breath sounds, no wheezes or crackles  Cardiovascular system: S1 & S2 heard, RRR. No JVD, murmurs, rubs, gallops or clicks. Gastrointestinal system: Abdomen is nondistended, soft and nontender. No organomegaly or masses felt. Normal bowel sounds heard. Central nervous system: Alert and oriented. No focal neurological deficits. Extremities: No  edema, no clubbing ,no cyanosis, distal peripheral pulses palpable. Skin: No rashes, lesions or ulcers,no icterus ,no pallor   Data Reviewed: I have personally reviewed following labs and imaging studies  CBC: Recent Labs  Lab 10/07/20 1548 10/08/20 0322  WBC 8.1 6.4  NEUTROABS 6.3  --   HGB 13.0 13.5  HCT 40.4 41.0  MCV 96.4 94.9  PLT 377 893   Basic Metabolic Panel: Recent Labs   Lab 10/07/20 1548 10/08/20 0322  NA 139 135  K 3.6 3.9  CL 102 101  CO2 26 23  GLUCOSE 96 184*  BUN 25* 25*  CREATININE 0.59 0.53  CALCIUM 9.3 9.3   GFR: CrCl cannot be calculated (Unknown ideal weight.). Liver Function Tests: No results for input(s): AST, ALT, ALKPHOS, BILITOT, PROT, ALBUMIN in the last 168 hours. No results for input(s): LIPASE, AMYLASE in the last 168 hours. No results for input(s): AMMONIA in the last 168 hours. Coagulation Profile: No results for input(s): INR, PROTIME in the last 168 hours. Cardiac Enzymes: No results for input(s): CKTOTAL, CKMB, CKMBINDEX, TROPONINI in the last 168 hours. BNP (last 3 results) No results for input(s): PROBNP in the last 8760 hours. HbA1C: No results for input(s): HGBA1C in the last 72 hours. CBG: No results for input(s): GLUCAP in the last 168 hours. Lipid Profile: No results for input(s): CHOL, HDL, LDLCALC, TRIG, CHOLHDL, LDLDIRECT in the last 72 hours. Thyroid Function Tests: No results for input(s): TSH, T4TOTAL, FREET4, T3FREE, THYROIDAB in the last 72 hours. Anemia Panel: No results for input(s): VITAMINB12, FOLATE, FERRITIN, TIBC, IRON, RETICCTPCT in the last 72 hours. Sepsis Labs: No results for input(s): PROCALCITON, LATICACIDVEN in the last 168 hours.  Recent Results (from the past 240 hour(s))  Resp Panel by RT-PCR (Flu A&B, Covid) Nasopharyngeal Swab     Status: None   Collection Time: 10/07/20 12:38 PM   Specimen: Nasopharyngeal Swab; Nasopharyngeal(NP) swabs in vial transport medium  Result Value Ref Range Status   SARS Coronavirus 2 by RT PCR NEGATIVE NEGATIVE Final    Comment: (NOTE) SARS-CoV-2 target nucleic acids are NOT DETECTED.  The SARS-CoV-2 RNA is generally detectable in upper respiratory specimens during the acute phase of infection. The lowest concentration of SARS-CoV-2 viral copies this assay can detect is 138 copies/mL. A negative result does not preclude SARS-Cov-2 infection and  should not be used as the sole basis for treatment or other patient management decisions. A negative result may occur with  improper specimen collection/handling, submission of specimen other than nasopharyngeal swab, presence of viral mutation(s) within the areas targeted by this assay, and inadequate number of viral copies(<138 copies/mL). A negative result must be combined with clinical observations, patient history, and epidemiological information. The expected result is Negative.  Fact Sheet for Patients:  EntrepreneurPulse.com.au  Fact Sheet for Healthcare Providers:  IncredibleEmployment.be  This test is no t yet approved or cleared by the [place] FDA and  has been authorized for detection and/or diagnosis of SARS-CoV-2 by FDA under an Emergency Use Authorization (EUA). This EUA will remain  in effect (meaning this test can be used) for the duration of the COVID-19 declaration under Section 564(b)(1) of the Act, 21 U.S.C.section 360bbb-3(b)(1), unless the authorization is terminated  or revoked sooner.       Influenza A by PCR NEGATIVE NEGATIVE Final   Influenza B by PCR NEGATIVE NEGATIVE Final    Comment: (NOTE) The Xpert Xpress SARS-CoV-2/FLU/RSV plus assay is intended as an aid in the diagnosis of  influenza from Nasopharyngeal swab specimens and should not be used as a sole basis for treatment. Nasal washings and aspirates are unacceptable for Xpert Xpress SARS-CoV-2/FLU/RSV testing.  Fact Sheet for Patients: EntrepreneurPulse.com.au  Fact Sheet for Healthcare Providers: IncredibleEmployment.be  This test is not yet approved or cleared by the [place] FDA and has been authorized for detection and/or diagnosis of SARS-CoV-2 by FDA under an Emergency Use Authorization (EUA). This EUA will remain in effect (meaning this test can be used) for the duration of the COVID-19 declaration  under Section 564(b)(1) of the Act, 21 U.S.C. section 360bbb-3(b)(1), unless the authorization is terminated or revoked.  Performed at [facility], [place] [address], [place], [place] [address]          Radiology Studies: DG Chest 2 View  Result Date: 10/07/2020 CLINICAL DATA:  Cough. EXAM: CHEST - 2 VIEW COMPARISON:  None. FINDINGS: The heart size and mediastinal contours are within normal limits. Tortuous aorta. Both lungs are clear. Hyperinflation. No visible pleural effusions or pneumothorax. No acute osseous abnormality. IMPRESSION: 1. No active cardiopulmonary disease. 2. Hyperinflation. Electronically Signed   By: Margaretha Sheffield MD   On: 10/07/2020 11:48   MR Brain W and Wo Contrast  Result Date: 10/07/2020 CLINICAL DATA:  69 year old female with a history of left frontotemporal brain lesion since 2019, initially worked up for [place]. Status post craniotomy and biopsy March 2019 revealing "macrophage rich lesion". Recently, underlying etiology favored to be inflammatory rather than neoplastic, with greatest suspicion of neurosarcoidosis. New onset cough, hemoptysis. EXAM: MRI HEAD WITHOUT AND WITH CONTRAST TECHNIQUE: Multiplanar, multiecho pulse sequences of the brain and surrounding structures were obtained without and with intravenous contrast. CONTRAST:  9.53mL GADAVIST GADOBUTROL 1 MMOL/ML IV SOLN COMPARISON:  Brain MRI 04/19/2020 and earlier. Total spine MRI 05/23/2018. FINDINGS: Brain: Cerebral volume remains stable. No restricted diffusion to suggest acute infarction. No midline shift, mass effect, ventriculomegaly, extra-axial collection or acute intracranial hemorrhage. Pituitary within normal limits. However, there is confluent new abnormal T2 and FLAIR hyperintense signal in the lower brainstem and cervicomedullary junction (series 8, image 4 and series 11, image 6) and evidence that the abnormal signal continues into the cervical spinal cord (series 15,  image 19 and series 7, image 13. Near complete involvement of the cervicomedullary junction, and right eccentric near holo cord involvement of the visible upper spinal cord. Minimal associated masslike expansion of the cervicomedullary junction. Associated new abnormal surface and to a lesser extent underlying parenchymal enhancement along the right posterolateral cervicomedullary junction (series 18, image 13 and series 16, image 13). Diffusion appears facilitated in the lesion. No evidence of hemorrhage. Elsewhere stable 9 mm dural-based solid enhancing lesion along the left inferior frontal convexity (series 16, image 87). Regional smooth dural thickening is stable. Regional T2 and FLAIR hyperintensity has regressed since June (series 11, image 29). Stable superimposed left anterior temporal lobe surgical cavity and regional T2/FLAIR signal abnormality with no enhancement. Elsewhere gray and white matter signal remains stable throughout the brain and no other new or abnormal intracranial enhancement or dural thickening is identified. Vascular: Major intracranial vascular flow voids are stable. The major dural venous sinuses are enhancing and appear to be patent. Skull and upper cervical spine: Suspicion of abnormal signal now throughout much of the visible cervical spinal cord (as above). Superimposed upper cervical spine degeneration appears stable. Underlying bone marrow signal is stable and within normal limits. Previous left frontotemporal craniotomy. Sinuses/Orbits: Postoperative changes to both globes since  June. Otherwise stable and negative. Other: Mastoids remain well aerated. Grossly normal visible internal auditory structures. IMPRESSION: 1. New since June extensive abnormal signal in the lower brainstem and visible spinal cord. Associated new abnormal enhancement, primarily surface enhancement along the right posterior cervicomedullary junction. Burtis Junes this is progression of the underlying chronic CNS  inflammatory process. Follow-up Cervical and/or Total Spine MRI without and with contrast would be valuable and could be compared to the 2019 total spine MRI. 2. Elsewhere stable MRI appearance of the brain since June - actually with some regression of abnormal FLAIR signal in the area of the chronic left inferior frontal gyrus lesion. Electronically Signed   By: Genevie Ann M.D.   On: 10/07/2020 15:18        Scheduled Meds: . aspirin EC  81 mg Oral Daily  . diltiazem  180 mg Oral Daily  . enoxaparin (LOVENOX) injection  40 mg Subcutaneous Q24H  . fluticasone  1 spray Each Nare Daily  . levETIRAcetam  250 mg Oral BID  . loratadine  10 mg Oral Daily  . multivitamin with minerals  1 tablet Oral Daily  . pantoprazole  40 mg Oral Daily   Continuous Infusions: . methylPREDNISolone (SOLU-MEDROL) injection       LOS: 0 days    Time spent: 35 mins.More than 50% of that time was spent in counseling and/or coordination of care.      Shelly Coss, MD [facility] P12/05/2020, 7:45 AM

## 2020-10-08 NOTE — Progress Notes (Addendum)
Tara Coss, MD paged regarding the pt refusing lovenox. Education provided regarding blood clot prevention. Pt continued to refuse med. Ambulating frequently.

## 2020-10-08 NOTE — Progress Notes (Signed)
Shelly Coss, MD paged regarding the pt's refusal to take scheduled PO meds. Pt has a weekly med container filled with unlabeled meds from home. Wants to take meds from home instead of having to pay for meds from here per pt. Education provided. Pt continues to refuse.

## 2020-10-09 MED ORDER — FLUTICASONE PROPIONATE 50 MCG/ACT NA SUSP
1.0000 | Freq: Every day | NASAL | 0 refills | Status: DC
Start: 2020-10-10 — End: 2022-10-22

## 2020-10-09 MED ORDER — LORATADINE 10 MG PO TABS: 10.0000 mg | ORAL_TABLET | Freq: Every day | ORAL | 0 refills | Status: DC | PRN

## 2020-10-09 MED ORDER — DEXAMETHASONE 4 MG PO TABS: ORAL_TABLET | ORAL | 0 refills | Status: AC

## 2020-10-09 NOTE — Discharge Instructions (Signed)
Tara [name],  You were in the hospital because of concerns for neurosarcoidosis. You were treated with high dose steroids and will be discharged with a steroid taper. Please follow-up with your PCP. Dr. Mickeal Skinner will also follow-up with you and has sent referrals to [facility]. If symptoms return, please return for evaluation.

## 2020-10-09 NOTE — Discharge Summary (Signed)
Physician Discharge Summary  CITLALY CAMPLIN BOF:[id number] DOB: 08/09/1951 DOA: 10/07/2020  PCP: Forrest Moron, MD  Admit date: 10/07/2020 Discharge date: 10/09/2020  Admitted From: Home Disposition: Home  Recommendations for Outpatient Follow-up:  1. Follow up with PCP in 1 week 2. Please obtain BMP/CBC in one week 3. Please follow up on the following pending results: None  Home Health: None Equipment/Devices: None  Discharge Condition: Stable CODE STATUS: Full code Diet recommendation: Heart healthy   Brief/Interim Summary:  Admission HPI written by Shelly Coss, MD   Chief Complaint: Cough, runny nose  HPI: [name] is a 69 y.o. 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 Mickeal Skinner who presents to the emergency department for the evaluation of runny nose, cough.  She was reporting nasal congestion, postnasal drip for last 3 days.  She lives with her husband.  Patient has been following with neuro oncology for last several years for the management of possible neurosarcoidosis and has underwent extensive work-up without clear evidence including brain biopsy.  She was found to have brain masses which were not found to inflammatory than neoplastic. Patient has history of aphasia/word finding difficulty and some memory deficits due to this but she does not have any focal neurological deficits and is fully functional. Patient did not report any fever, chills, exposure to Covid , nausea, vomiting, abdominal pain, palpitations, diarrhea, dysuria or hematochezia.  She was having some headache mostly in the frontal sinus area.  She has been fully vaccinated against Covid. Chest x-ray done in the emergency department did not show any pneumonia. Patient was planned for follow-up MRI soon as per neuro-oncology.  Since patient presented to the hospital today with upper respiratory symptoms, decision was made to do the  MRI here.  MRI showed abnormal findings as below.  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 Neurooncology consulted and patient started on high dose steroids with Solumedrol 1g IV daily. Her symptoms drastically improved and she completed a 3 day course of Solu-Medrol IV and was transitioned to a short decadron taper on discharge. Patient will be set up for [facility] follow-up by neuro oncology.  Rhinorrhea Patient started on Flonase and Claritin. Continue on discharge.  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 No symptoms currently. Recommend outpatient neurosurgery follow-up.  Discharge Diagnoses:  Active Problems:   Neurosarcoidosis    Discharge Instructions   Allergies as of 10/09/2020      Reactions   Ace Inhibitors Cough   Ether Other (See Comments)   unknown      Medication List    TAKE these medications   aspirin EC 81 MG tablet Take 1 tablet (81 mg total) by mouth daily. What changed: Another medication with the same name was removed. Continue taking this medication, and follow the directions you see here.   dexamethasone 4 MG tablet Commonly known as: DECADRON Take 1 tablet (4 mg total) by mouth 2 (two) times daily for 3 days, THEN 1 tablet (4 mg total) daily for 3 days. Start taking on: October 10, 2020   diltiazem 180 MG 24 hr capsule Commonly known as: CARDIZEM CD Take 1 capsule (180 mg total) by mouth daily.   fluticasone 50 MCG/ACT nasal spray Commonly known as: FLONASE Place 1 spray into both nostrils daily. Start taking on: October 10, 2020   levETIRAcetam 250 MG tablet Commonly known as: KEPPRA TAKE  1 TABLET BY MOUTH TWICE A DAY   loratadine 10 MG tablet Commonly known as: CLARITIN Take 1 tablet (10 mg total) by mouth daily as needed for allergies, rhinitis or itching.   multivitamin with minerals Tabs tablet Take 1  tablet by mouth daily.   OVER THE COUNTER MEDICATION Take 4 mLs by mouth daily as needed (cold). Cortisone 20 mg liquid   PRESERVISION AREDS PO Take 1 capsule by mouth in the morning and at bedtime.   sodium chloride 0.65 % Soln nasal spray Commonly known as: OCEAN Place 1 spray into both nostrils daily as needed for congestion.       Follow-up Information    Forrest Moron, MD Follow up.   Specialty: Internal Medicine Why: Hospital follow-up Contact information: [place] [address] [phone]        [name], MD. Call.   Specialties: Psychiatry, Neurology, Oncology Contact information: [place] [address] [phone]              Allergies  Allergen Reactions  . Ace Inhibitors Cough  . Ether Other (See Comments)    unknown    Consultations:  Neuro medical oncology   Procedures/Studies: DG Chest 2 View  Result Date: 10/07/2020 CLINICAL DATA:  Cough. EXAM: CHEST - 2 VIEW COMPARISON:  None. FINDINGS: The heart size and mediastinal contours are within normal limits. Tortuous aorta. Both lungs are clear. Hyperinflation. No visible pleural effusions or pneumothorax. No acute osseous abnormality. IMPRESSION: 1. No active cardiopulmonary disease. 2. Hyperinflation. Electronically Signed   By: Margaretha Sheffield MD   On: 10/07/2020 11:48   MR Brain W and Wo Contrast  Result Date: 10/07/2020 CLINICAL DATA:  69 year old female with a history of left frontotemporal brain lesion since 2019, initially worked up for [place]. Status post craniotomy and biopsy March 2019 revealing "macrophage rich lesion". Recently, underlying etiology favored to be inflammatory rather than neoplastic, with greatest suspicion of neurosarcoidosis. New onset cough, hemoptysis. EXAM: MRI HEAD WITHOUT AND WITH CONTRAST TECHNIQUE: Multiplanar, multiecho pulse sequences of the brain and surrounding structures were obtained without and with  intravenous contrast. CONTRAST:  9.37mL GADAVIST GADOBUTROL 1 MMOL/ML IV SOLN COMPARISON:  Brain MRI 04/19/2020 and earlier. Total spine MRI 05/23/2018. FINDINGS: Brain: Cerebral volume remains stable. No restricted diffusion to suggest acute infarction. No midline shift, mass effect, ventriculomegaly, extra-axial collection or acute intracranial hemorrhage. Pituitary within normal limits. However, there is confluent new abnormal T2 and FLAIR hyperintense signal in the lower brainstem and cervicomedullary junction (series 8, image 4 and series 11, image 6) and evidence that the abnormal signal continues into the cervical spinal cord (series 15, image 19 and series 7, image 13. Near complete involvement of the cervicomedullary junction, and right eccentric near holo cord involvement of the visible upper spinal cord. Minimal associated masslike expansion of the cervicomedullary junction. Associated new abnormal surface and to a lesser extent underlying parenchymal enhancement along the right posterolateral cervicomedullary junction (series 18, image 13 and series 16, image 13). Diffusion appears facilitated in the lesion. No evidence of hemorrhage. Elsewhere stable 9 mm dural-based solid enhancing lesion along the left inferior frontal convexity (series 16, image 87). Regional smooth dural thickening is stable. Regional T2 and FLAIR hyperintensity has regressed since June (series 11, image 29). Stable superimposed left anterior temporal lobe surgical cavity and regional T2/FLAIR signal abnormality with no enhancement. Elsewhere gray and white matter signal remains stable throughout the brain and no other new  or abnormal intracranial enhancement or dural thickening is identified. Vascular: Major intracranial vascular flow voids are stable. The major dural venous sinuses are enhancing and appear to be patent. Skull and upper cervical spine: Suspicion of abnormal signal now throughout much of the visible cervical spinal  cord (as above). Superimposed upper cervical spine degeneration appears stable. Underlying bone marrow signal is stable and within normal limits. Previous left frontotemporal craniotomy. Sinuses/Orbits: Postoperative changes to both globes since June. Otherwise stable and negative. Other: Mastoids remain well aerated. Grossly normal visible internal auditory structures. IMPRESSION: 1. New since June extensive abnormal signal in the lower brainstem and visible spinal cord. Associated new abnormal enhancement, primarily surface enhancement along the right posterior cervicomedullary junction. Burtis Junes this is progression of the underlying chronic CNS inflammatory process. Follow-up Cervical and/or Total Spine MRI without and with contrast would be valuable and could be compared to the 2019 total spine MRI. 2. Elsewhere stable MRI appearance of the brain since June - actually with some regression of abnormal FLAIR signal in the area of the chronic left inferior frontal gyrus lesion. Electronically Signed   By: Genevie Ann M.D.   On: 10/07/2020 15:18   MR CERVICAL SPINE W WO CONTRAST  Result Date: 10/08/2020 CLINICAL DATA:  History of left frontal brain lesion suspected to be inflammatory in etiology, particularly neurosarcoidosis, over neoplasm. Brain MRI yesterday demonstrating signal abnormality in the lower brainstem and visible cervical spinal cord. EXAM: MRI CERVICAL, THORACIC AND LUMBAR SPINE WITHOUT AND WITH CONTRAST TECHNIQUE: Multiplanar and multiecho pulse sequences of the cervical spine, to include the craniocervical junction and cervicothoracic junction, and thoracic and lumbar spine, were obtained without and with intravenous contrast. CONTRAST:  9.60mL GADAVIST GADOBUTROL 1 MMOL/ML IV SOLN COMPARISON:  Total spine MRI 05/23/2018 FINDINGS: MRI CERVICAL SPINE FINDINGS The sagittal T2 sequence is moderately motion degraded. Alignment: Chronic cervical spine straightening with trace anterolisthesis of C4 on C5 and  trace retrolisthesis of C3 on C4. Vertebrae: No fracture or suspicious osseous lesion. Degenerative endplate changes from V3-X1 including mild degenerative endplate edema and enhancement at C3-4. Cord: Extensive T2 hyperintensity/edema extending from the medulla inferiorly throughout much of the cervical spinal cord to the C7 level with cord enlargement. 11 mm curvilinear focus of plaque-like enhancement along the right dorsal spinal cord at the cervicomedullary junction. No abnormal enhancement elsewhere in the cervical spinal cord. Posterior Fossa, vertebral arteries, paraspinal tissues: Preserved vertebral artery flow voids. Unremarkable paraspinal soft tissues. Disc levels: C2-3: Mild-to-moderate right facet arthrosis results in mild right neural foraminal stenosis without spinal stenosis. C3-4: Retrolisthesis, broad-based posterior disc osteophyte complex, and moderate facet arthrosis result in moderate spinal stenosis with mild cord flattening and moderate right greater than left neural foraminal stenosis. C4-5: Anterolisthesis with disc uncovering, a small left paracentral disc protrusion, and mild facet arthrosis result in mild spinal stenosis with slight ventral cord flattening and moderate right and mild left neural foraminal stenosis. C5-6: A right paracentral disc osteophyte complex and bilateral uncovertebral spurring result in mild spinal stenosis and mild-to-moderate bilateral neural foraminal stenosis. C6-7: A broad-based posterior disc osteophyte complex results in mild spinal stenosis and mild-to-moderate right and moderate to severe left neural foraminal stenosis. C7-T1: Mild facet arthrosis without disc herniation or stenosis. The lack of axial images through the cervical spine on the prior MRI limits comparison, however degenerative changes have progressed at C3-4. MRI THORACIC SPINE FINDINGS The study is intermittently up to moderately motion degraded. Alignment: Normal. Vertebrae: No fracture  or suspicious osseous lesion.  Numerous small Schmorl's nodes. Multilevel degenerative endplate changes with only minimal scattered degenerative endplate edema. Cord: Normal signal and morphology. No abnormal intradural enhancement. Paraspinal and other soft tissues: Partially visualized left renal cyst. Disc levels: Small central disc protrusion at T11-12. No thoracic spinal stenosis. Mild-to-moderate multilevel thoracic facet arthrosis with mild right neural foraminal stenosis at T6-7. MRI LUMBAR SPINE FINDINGS Segmentation:  Standard. Alignment: Facet mediated grade 1 anterolisthesis of L4 on L5 and L5 on S1, unchanged. Vertebrae: No fracture or suspicious osseous lesion. Mild multilevel degenerative endplate changes with at most minimal degenerative edema. Conus medullaris and cauda equina: Conus extends to the upper L1 level. Conus and cauda equina appear normal. Paraspinal and other soft tissues: Partially visualized left renal cyst. 2 cm gallstone. Disc levels: Disc desiccation throughout the lumbar spine. Mild disc space narrowing throughout the lumbar spine with exception of preserved height at L3-4. L1-2: Disc bulging and mild facet arthrosis without stenosis. L2-3: Disc bulging mildly eccentric to the right and moderate facet arthrosis result in borderline to mild spinal stenosis, mild right lateral recess stenosis, and mild right neural foraminal stenosis. L3-4: Mild disc bulging and mild facet arthrosis without stenosis. L4-5: Anterolisthesis with bulging uncovered disc, ligamentum flavum hypertrophy, and severe facet hypertrophy result in moderate spinal stenosis, severe bilateral lateral recess stenosis, and moderate right and moderate to severe left neural foraminal stenosis. Potential left L4 and bilateral L5 nerve root impingement. L5-S1: Anterolisthesis with disc uncovering and severe facet arthrosis without stenosis. IMPRESSION: 1. Extensive T2 hyperintensity/edema extending from the medulla  inferiorly throughout most of the cervical spinal cord with cord expansion and a small region of plaque-like enhancement along the cord surface at the cervicomedullary junction. This presumably reflects involvement by the patient's underlying CNS inflammatory process, and sarcoidosis could have this appearance as well as other causes of transverse myelitis. Neoplasm is considered less likely. 2. No evidence of thoracic or lumbar spine involvement. 3. Multilevel cervical disc and facet degeneration, progressed at C3-4 where there is moderate spinal and neural foraminal stenosis. 4. Severe lower lumbar facet arthrosis with grade 1 anterolisthesis, severe spinal stenosis, and moderate to severe neural foraminal stenosis at L4-5. Electronically Signed   By: Logan Bores M.D.   On: 10/08/2020 15:17   MR THORACIC SPINE W WO CONTRAST  Result Date: 10/08/2020 CLINICAL DATA:  History of left frontal brain lesion suspected to be inflammatory in etiology, particularly neurosarcoidosis, over neoplasm. Brain MRI yesterday demonstrating signal abnormality in the lower brainstem and visible cervical spinal cord. EXAM: MRI CERVICAL, THORACIC AND LUMBAR SPINE WITHOUT AND WITH CONTRAST TECHNIQUE: Multiplanar and multiecho pulse sequences of the cervical spine, to include the craniocervical junction and cervicothoracic junction, and thoracic and lumbar spine, were obtained without and with intravenous contrast. CONTRAST:  9.48mL GADAVIST GADOBUTROL 1 MMOL/ML IV SOLN COMPARISON:  Total spine MRI 05/23/2018 FINDINGS: MRI CERVICAL SPINE FINDINGS The sagittal T2 sequence is moderately motion degraded. Alignment: Chronic cervical spine straightening with trace anterolisthesis of C4 on C5 and trace retrolisthesis of C3 on C4. Vertebrae: No fracture or suspicious osseous lesion. Degenerative endplate changes from C1-U3 including mild degenerative endplate edema and enhancement at C3-4. Cord: Extensive T2 hyperintensity/edema extending  from the medulla inferiorly throughout much of the cervical spinal cord to the C7 level with cord enlargement. 11 mm curvilinear focus of plaque-like enhancement along the right dorsal spinal cord at the cervicomedullary junction. No abnormal enhancement elsewhere in the cervical spinal cord. Posterior Fossa, vertebral arteries, paraspinal tissues: Preserved vertebral artery  flow voids. Unremarkable paraspinal soft tissues. Disc levels: C2-3: Mild-to-moderate right facet arthrosis results in mild right neural foraminal stenosis without spinal stenosis. C3-4: Retrolisthesis, broad-based posterior disc osteophyte complex, and moderate facet arthrosis result in moderate spinal stenosis with mild cord flattening and moderate right greater than left neural foraminal stenosis. C4-5: Anterolisthesis with disc uncovering, a small left paracentral disc protrusion, and mild facet arthrosis result in mild spinal stenosis with slight ventral cord flattening and moderate right and mild left neural foraminal stenosis. C5-6: A right paracentral disc osteophyte complex and bilateral uncovertebral spurring result in mild spinal stenosis and mild-to-moderate bilateral neural foraminal stenosis. C6-7: A broad-based posterior disc osteophyte complex results in mild spinal stenosis and mild-to-moderate right and moderate to severe left neural foraminal stenosis. C7-T1: Mild facet arthrosis without disc herniation or stenosis. The lack of axial images through the cervical spine on the prior MRI limits comparison, however degenerative changes have progressed at C3-4. MRI THORACIC SPINE FINDINGS The study is intermittently up to moderately motion degraded. Alignment: Normal. Vertebrae: No fracture or suspicious osseous lesion. Numerous small Schmorl's nodes. Multilevel degenerative endplate changes with only minimal scattered degenerative endplate edema. Cord: Normal signal and morphology. No abnormal intradural enhancement. Paraspinal and  other soft tissues: Partially visualized left renal cyst. Disc levels: Small central disc protrusion at T11-12. No thoracic spinal stenosis. Mild-to-moderate multilevel thoracic facet arthrosis with mild right neural foraminal stenosis at T6-7. MRI LUMBAR SPINE FINDINGS Segmentation:  Standard. Alignment: Facet mediated grade 1 anterolisthesis of L4 on L5 and L5 on S1, unchanged. Vertebrae: No fracture or suspicious osseous lesion. Mild multilevel degenerative endplate changes with at most minimal degenerative edema. Conus medullaris and cauda equina: Conus extends to the upper L1 level. Conus and cauda equina appear normal. Paraspinal and other soft tissues: Partially visualized left renal cyst. 2 cm gallstone. Disc levels: Disc desiccation throughout the lumbar spine. Mild disc space narrowing throughout the lumbar spine with exception of preserved height at L3-4. L1-2: Disc bulging and mild facet arthrosis without stenosis. L2-3: Disc bulging mildly eccentric to the right and moderate facet arthrosis result in borderline to mild spinal stenosis, mild right lateral recess stenosis, and mild right neural foraminal stenosis. L3-4: Mild disc bulging and mild facet arthrosis without stenosis. L4-5: Anterolisthesis with bulging uncovered disc, ligamentum flavum hypertrophy, and severe facet hypertrophy result in moderate spinal stenosis, severe bilateral lateral recess stenosis, and moderate right and moderate to severe left neural foraminal stenosis. Potential left L4 and bilateral L5 nerve root impingement. L5-S1: Anterolisthesis with disc uncovering and severe facet arthrosis without stenosis. IMPRESSION: 1. Extensive T2 hyperintensity/edema extending from the medulla inferiorly throughout most of the cervical spinal cord with cord expansion and a small region of plaque-like enhancement along the cord surface at the cervicomedullary junction. This presumably reflects involvement by the patient's underlying CNS  inflammatory process, and sarcoidosis could have this appearance as well as other causes of transverse myelitis. Neoplasm is considered less likely. 2. No evidence of thoracic or lumbar spine involvement. 3. Multilevel cervical disc and facet degeneration, progressed at C3-4 where there is moderate spinal and neural foraminal stenosis. 4. Severe lower lumbar facet arthrosis with grade 1 anterolisthesis, severe spinal stenosis, and moderate to severe neural foraminal stenosis at L4-5. Electronically Signed   By: Logan Bores M.D.   On: 10/08/2020 15:17   MR Lumbar Spine W Wo Contrast  Result Date: 10/08/2020 CLINICAL DATA:  History of left frontal brain lesion suspected to be inflammatory in etiology, particularly neurosarcoidosis,  over neoplasm. Brain MRI yesterday demonstrating signal abnormality in the lower brainstem and visible cervical spinal cord. EXAM: MRI CERVICAL, THORACIC AND LUMBAR SPINE WITHOUT AND WITH CONTRAST TECHNIQUE: Multiplanar and multiecho pulse sequences of the cervical spine, to include the craniocervical junction and cervicothoracic junction, and thoracic and lumbar spine, were obtained without and with intravenous contrast. CONTRAST:  9.37mL GADAVIST GADOBUTROL 1 MMOL/ML IV SOLN COMPARISON:  Total spine MRI 05/23/2018 FINDINGS: MRI CERVICAL SPINE FINDINGS The sagittal T2 sequence is moderately motion degraded. Alignment: Chronic cervical spine straightening with trace anterolisthesis of C4 on C5 and trace retrolisthesis of C3 on C4. Vertebrae: No fracture or suspicious osseous lesion. Degenerative endplate changes from Z0-C5 including mild degenerative endplate edema and enhancement at C3-4. Cord: Extensive T2 hyperintensity/edema extending from the medulla inferiorly throughout much of the cervical spinal cord to the C7 level with cord enlargement. 11 mm curvilinear focus of plaque-like enhancement along the right dorsal spinal cord at the cervicomedullary junction. No abnormal  enhancement elsewhere in the cervical spinal cord. Posterior Fossa, vertebral arteries, paraspinal tissues: Preserved vertebral artery flow voids. Unremarkable paraspinal soft tissues. Disc levels: C2-3: Mild-to-moderate right facet arthrosis results in mild right neural foraminal stenosis without spinal stenosis. C3-4: Retrolisthesis, broad-based posterior disc osteophyte complex, and moderate facet arthrosis result in moderate spinal stenosis with mild cord flattening and moderate right greater than left neural foraminal stenosis. C4-5: Anterolisthesis with disc uncovering, a small left paracentral disc protrusion, and mild facet arthrosis result in mild spinal stenosis with slight ventral cord flattening and moderate right and mild left neural foraminal stenosis. C5-6: A right paracentral disc osteophyte complex and bilateral uncovertebral spurring result in mild spinal stenosis and mild-to-moderate bilateral neural foraminal stenosis. C6-7: A broad-based posterior disc osteophyte complex results in mild spinal stenosis and mild-to-moderate right and moderate to severe left neural foraminal stenosis. C7-T1: Mild facet arthrosis without disc herniation or stenosis. The lack of axial images through the cervical spine on the prior MRI limits comparison, however degenerative changes have progressed at C3-4. MRI THORACIC SPINE FINDINGS The study is intermittently up to moderately motion degraded. Alignment: Normal. Vertebrae: No fracture or suspicious osseous lesion. Numerous small Schmorl's nodes. Multilevel degenerative endplate changes with only minimal scattered degenerative endplate edema. Cord: Normal signal and morphology. No abnormal intradural enhancement. Paraspinal and other soft tissues: Partially visualized left renal cyst. Disc levels: Small central disc protrusion at T11-12. No thoracic spinal stenosis. Mild-to-moderate multilevel thoracic facet arthrosis with mild right neural foraminal stenosis at  T6-7. MRI LUMBAR SPINE FINDINGS Segmentation:  Standard. Alignment: Facet mediated grade 1 anterolisthesis of L4 on L5 and L5 on S1, unchanged. Vertebrae: No fracture or suspicious osseous lesion. Mild multilevel degenerative endplate changes with at most minimal degenerative edema. Conus medullaris and cauda equina: Conus extends to the upper L1 level. Conus and cauda equina appear normal. Paraspinal and other soft tissues: Partially visualized left renal cyst. 2 cm gallstone. Disc levels: Disc desiccation throughout the lumbar spine. Mild disc space narrowing throughout the lumbar spine with exception of preserved height at L3-4. L1-2: Disc bulging and mild facet arthrosis without stenosis. L2-3: Disc bulging mildly eccentric to the right and moderate facet arthrosis result in borderline to mild spinal stenosis, mild right lateral recess stenosis, and mild right neural foraminal stenosis. L3-4: Mild disc bulging and mild facet arthrosis without stenosis. L4-5: Anterolisthesis with bulging uncovered disc, ligamentum flavum hypertrophy, and severe facet hypertrophy result in moderate spinal stenosis, severe bilateral lateral recess stenosis, and moderate right and moderate  to severe left neural foraminal stenosis. Potential left L4 and bilateral L5 nerve root impingement. L5-S1: Anterolisthesis with disc uncovering and severe facet arthrosis without stenosis. IMPRESSION: 1. Extensive T2 hyperintensity/edema extending from the medulla inferiorly throughout most of the cervical spinal cord with cord expansion and a small region of plaque-like enhancement along the cord surface at the cervicomedullary junction. This presumably reflects involvement by the patient's underlying CNS inflammatory process, and sarcoidosis could have this appearance as well as other causes of transverse myelitis. Neoplasm is considered less likely. 2. No evidence of thoracic or lumbar spine involvement. 3. Multilevel cervical disc and facet  degeneration, progressed at C3-4 where there is moderate spinal and neural foraminal stenosis. 4. Severe lower lumbar facet arthrosis with grade 1 anterolisthesis, severe spinal stenosis, and moderate to severe neural foraminal stenosis at L4-5. Electronically Signed   By: Logan Bores M.D.   On: 10/08/2020 15:17      Subjective: No issues today. Feels well.   Discharge Exam: Vitals:   10/08/20 2213 10/09/20 0540  BP: 128/64 (!) 142/85  Pulse: 72 70  Resp: 16 16  Temp: 98.3 F (36.8 C) (!) 97.5 F (36.4 C)  SpO2: 95% 95%   Vitals:   10/08/20 0536 10/08/20 1539 10/08/20 2213 10/09/20 0540  BP: (!) 143/74 (!) 155/89 128/64 (!) 142/85  Pulse: 76 66 72 70  Resp: 16 18 16 16   Temp: 97.6 F (36.4 C) 98 F (36.7 C) 98.3 F (36.8 C) (!) 97.5 F (36.4 C)  TempSrc:  Oral  Oral  SpO2: 93% 96% 95% 95%    General: Pt is alert, awake, not in acute distress Cardiovascular: RRR, S1/S2 +, no rubs, no gallops Respiratory: CTA bilaterally, no wheezing, no rhonchi Abdominal: Soft, NT, ND, bowel sounds + Extremities: no edema, no cyanosis    The results of significant diagnostics from this hospitalization (including imaging, microbiology, ancillary and laboratory) are listed below for reference.     Microbiology: Recent Results (from the past 240 hour(s))  Resp Panel by RT-PCR (Flu A&B, Covid) Nasopharyngeal Swab     Status: None   Collection Time: 10/07/20 12:38 PM   Specimen: Nasopharyngeal Swab; Nasopharyngeal(NP) swabs in vial transport medium  Result Value Ref Range Status   SARS Coronavirus 2 by RT PCR NEGATIVE NEGATIVE Final    Comment: (NOTE) SARS-CoV-2 target nucleic acids are NOT DETECTED.  The SARS-CoV-2 RNA is generally detectable in upper respiratory specimens during the acute phase of infection. The lowest concentration of SARS-CoV-2 viral copies this assay can detect is 138 copies/mL. A negative result does not preclude SARS-Cov-2 infection and should not be used  as the sole basis for treatment or other patient management decisions. A negative result may occur with  improper specimen collection/handling, submission of specimen other than nasopharyngeal swab, presence of viral mutation(s) within the areas targeted by this assay, and inadequate number of viral copies(<138 copies/mL). A negative result must be combined with clinical observations, patient history, and epidemiological information. The expected result is Negative.  Fact Sheet for Patients:  EntrepreneurPulse.com.au  Fact Sheet for Healthcare Providers:  IncredibleEmployment.be  This test is no t yet approved or cleared by the [place] FDA and  has been authorized for detection and/or diagnosis of SARS-CoV-2 by FDA under an Emergency Use Authorization (EUA). This EUA will remain  in effect (meaning this test can be used) for the duration of the COVID-19 declaration under Section 564(b)(1) of the Act, 21 U.S.C.section 360bbb-3(b)(1), unless the authorization is terminated  or revoked sooner.       Influenza A by PCR NEGATIVE NEGATIVE Final   Influenza B by PCR NEGATIVE NEGATIVE Final    Comment: (NOTE) The Xpert Xpress SARS-CoV-2/FLU/RSV plus assay is intended as an aid in the diagnosis of influenza from Nasopharyngeal swab specimens and should not be used as a sole basis for treatment. Nasal washings and aspirates are unacceptable for Xpert Xpress SARS-CoV-2/FLU/RSV testing.  Fact Sheet for Patients: EntrepreneurPulse.com.au  Fact Sheet for Healthcare Providers: IncredibleEmployment.be  This test is not yet approved or cleared by the [place] FDA and has been authorized for detection and/or diagnosis of SARS-CoV-2 by FDA under an Emergency Use Authorization (EUA). This EUA will remain in effect (meaning this test can be used) for the duration of the COVID-19 declaration under Section 564(b)(1)  of the Act, 21 U.S.C. section 360bbb-3(b)(1), unless the authorization is terminated or revoked.  Performed at [facility], [place] [address], [place], [place] [address]      Labs: BNP (last 3 results) No results for input(s): BNP in the last 8760 hours. Basic Metabolic Panel: Recent Labs  Lab 10/07/20 1548 10/08/20 0322  NA 139 135  K 3.6 3.9  CL 102 101  CO2 26 23  GLUCOSE 96 184*  BUN 25* 25*  CREATININE 0.59 0.53  CALCIUM 9.3 9.3   Liver Function Tests: No results for input(s): AST, ALT, ALKPHOS, BILITOT, PROT, ALBUMIN in the last 168 hours. No results for input(s): LIPASE, AMYLASE in the last 168 hours. No results for input(s): AMMONIA in the last 168 hours. CBC: Recent Labs  Lab 10/07/20 1548 10/08/20 0322  WBC 8.1 6.4  NEUTROABS 6.3  --   HGB 13.0 13.5  HCT 40.4 41.0  MCV 96.4 94.9  PLT 377 387   Cardiac Enzymes: No results for input(s): CKTOTAL, CKMB, CKMBINDEX, TROPONINI in the last 168 hours. BNP: Invalid input(s): POCBNP CBG: No results for input(s): GLUCAP in the last 168 hours. D-Dimer No results for input(s): DDIMER in the last 72 hours. Hgb A1c No results for input(s): HGBA1C in the last 72 hours. Lipid Profile No results for input(s): CHOL, HDL, LDLCALC, TRIG, CHOLHDL, LDLDIRECT in the last 72 hours. Thyroid function studies No results for input(s): TSH, T4TOTAL, T3FREE, THYROIDAB in the last 72 hours.  Invalid input(s): FREET3 Anemia work up No results for input(s): VITAMINB12, FOLATE, FERRITIN, TIBC, IRON, RETICCTPCT in the last 72 hours. Urinalysis    Component Value Date/Time   COLORURINE YELLOW 01/01/2018 0152   APPEARANCEUR HAZY (A) 01/01/2018 0152   LABSPEC 1.024 01/01/2018 0152   PHURINE 5.0 01/01/2018 0152   GLUCOSEU NEGATIVE 01/01/2018 0152   HGBUR LARGE (A) 01/01/2018 0152   BILIRUBINUR NEGATIVE 01/01/2018 0152   KETONESUR NEGATIVE 01/01/2018 0152   PROTEINUR NEGATIVE 01/01/2018 0152   NITRITE NEGATIVE  01/01/2018 0152   LEUKOCYTESUR MODERATE (A) 01/01/2018 0152   Sepsis Labs Invalid input(s): PROCALCITONIN,  WBC,  LACTICIDVEN Microbiology Recent Results (from the past 240 hour(s))  Resp Panel by RT-PCR (Flu A&B, Covid) Nasopharyngeal Swab     Status: None   Collection Time: 10/07/20 12:38 PM   Specimen: Nasopharyngeal Swab; Nasopharyngeal(NP) swabs in vial transport medium  Result Value Ref Range Status   SARS Coronavirus 2 by RT PCR NEGATIVE NEGATIVE Final    Comment: (NOTE) SARS-CoV-2 target nucleic acids are NOT DETECTED.  The SARS-CoV-2 RNA is generally detectable in upper respiratory specimens during the acute phase of infection. The lowest concentration of SARS-CoV-2 viral copies  this assay can detect is 138 copies/mL. A negative result does not preclude SARS-Cov-2 infection and should not be used as the sole basis for treatment or other patient management decisions. A negative result may occur with  improper specimen collection/handling, submission of specimen other than nasopharyngeal swab, presence of viral mutation(s) within the areas targeted by this assay, and inadequate number of viral copies(<138 copies/mL). A negative result must be combined with clinical observations, patient history, and epidemiological information. The expected result is Negative.  Fact Sheet for Patients:  EntrepreneurPulse.com.au  Fact Sheet for Healthcare Providers:  IncredibleEmployment.be  This test is no t yet approved or cleared by the [place] FDA and  has been authorized for detection and/or diagnosis of SARS-CoV-2 by FDA under an Emergency Use Authorization (EUA). This EUA will remain  in effect (meaning this test can be used) for the duration of the COVID-19 declaration under Section 564(b)(1) of the Act, 21 U.S.C.section 360bbb-3(b)(1), unless the authorization is terminated  or revoked sooner.       Influenza A by PCR NEGATIVE  NEGATIVE Final   Influenza B by PCR NEGATIVE NEGATIVE Final    Comment: (NOTE) The Xpert Xpress SARS-CoV-2/FLU/RSV plus assay is intended as an aid in the diagnosis of influenza from Nasopharyngeal swab specimens and should not be used as a sole basis for treatment. Nasal washings and aspirates are unacceptable for Xpert Xpress SARS-CoV-2/FLU/RSV testing.  Fact Sheet for Patients: EntrepreneurPulse.com.au  Fact Sheet for Healthcare Providers: IncredibleEmployment.be  This test is not yet approved or cleared by the [place] FDA and has been authorized for detection and/or diagnosis of SARS-CoV-2 by FDA under an Emergency Use Authorization (EUA). This EUA will remain in effect (meaning this test can be used) for the duration of the COVID-19 declaration under Section 564(b)(1) of the Act, 21 U.S.C. section 360bbb-3(b)(1), unless the authorization is terminated or revoked.  Performed at  [facility], [place] [address], [place], [place] [address]      Time coordinating discharge: 35 minutes  SIGNED:   Cordelia Poche, MD [facility] 10/09/2020, 10:51 AM

## 2020-10-09 NOTE — Progress Notes (Signed)
Pt provided with d/c instructions. After discussing the pt's plan of care upon d/c home, pt denied any further questions or concerns.  

## 2020-10-10 ENCOUNTER — Encounter: Payer: Self-pay | Admitting: Family Medicine

## 2020-10-10 ENCOUNTER — Ambulatory Visit (INDEPENDENT_AMBULATORY_CARE_PROVIDER_SITE_OTHER): Payer: Medicare Other | Admitting: Family Medicine

## 2020-10-10 ENCOUNTER — Telehealth: Payer: Self-pay | Admitting: Family Medicine

## 2020-10-10 ENCOUNTER — Other Ambulatory Visit: Payer: Self-pay

## 2020-10-10 VITALS — BP 158/87 | HR 74 | Temp 98.2°F | Resp 16 | Ht 66.0 in | Wt 211.4 lb

## 2020-10-10 DIAGNOSIS — R350 Frequency of micturition: Secondary | ICD-10-CM

## 2020-10-10 DIAGNOSIS — R7303 Prediabetes: Secondary | ICD-10-CM | POA: Diagnosis not present

## 2020-10-10 DIAGNOSIS — I10 Essential (primary) hypertension: Secondary | ICD-10-CM | POA: Diagnosis not present

## 2020-10-10 DIAGNOSIS — Z23 Encounter for immunization: Secondary | ICD-10-CM | POA: Diagnosis not present

## 2020-10-10 LAB — POCT GLYCOSYLATED HEMOGLOBIN (HGB A1C): Hemoglobin A1C: 6 % — AB (ref 4.0–5.6)

## 2020-10-10 LAB — GLUCOSE, POCT (MANUAL RESULT ENTRY): POC Glucose: 147 mg/dl — AB (ref 70–99)

## 2020-10-10 MED ORDER — DILTIAZEM HCL ER COATED BEADS 240 MG PO CP24: 240.0000 mg | ORAL_CAPSULE | Freq: Every day | ORAL | 1 refills | Status: DC

## 2020-10-10 NOTE — Progress Notes (Signed)
Patient ID: Tara [name], female    DOB: 1951/09/30  Age: 69 y.o. MRN: [id number]  Chief Complaint  Patient presents with  . Hospitalization Follow-up    Pt doing okay post hospital stay needs help regulating BP and also complains of inability to sleep    Subjective:  Patient was in the hospital.  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  Her blood pressure was high when she was in the hospital.  She is concerned that often it seems to be running high, and no one has addressed it.  She takes diltiazem 180 daily.  She is currently on Decadron to reduce brain swelling.  She has had some increased frequency of urination.  Her last hemoglobin A1c was in the prediabetic range.   Objective:  BP (!) 158/87   Pulse 74   Temp 98.2 F (36.8 C) (Temporal)   Resp 16   Ht 5\' 6"  (1.676 m)   Wt 211 lb 6.4 oz (95.9 kg)   SpO2 99%   BMI 34.12 kg/m   Pleasant alert lady in no acute distress.  Throat clear.  Neck supple without nodes or thyromegaly.  No carotid bruits.  Chest clear.  Heart regular without murmur. Current allergies, medications, problem list, past/family and social histories reviewed.  Assessment & Plan:   Assessment: 1. Urinary frequency   2. Essential hypertension   3. Prediabetes       Plan: They are going to go ahead and do the glucose and A1c even though the lab had been shut down.  Orders Placed This Encounter  Procedures  . Flu Vaccine QUAD 6+ mos PF IM (Fluarix Quad PF)  . POCT glucose (manual entry)  . POCT glycosylated hemoglobin (Hb A1C)    Meds ordered this encounter  Medications  . diltiazem (CARTIA XT) 240 MG 24 hr capsule    Sig: Take 1 capsule (240 mg total) by mouth daily.    Dispense:  90 capsule    Refill:  1         Patient Instructions   I have increased your blood pressure medicine to 240 mg of diltiazem daily.  If your blood pressure remains high we can move up another  notch.  Your last blood sugar a few days ago was high.  The cortisone you are on increases the risk of it going higher so it would be good for [place] to know what it is and I have ordered sugar test on you.  Continue to follow closely with your neurosurgical physicians.  See someone back for your blood pressure again in a couple of months  You are prediabetic but not in any major concern at this point.   f you have lab work done today you will be contacted with your lab results within the next 2 weeks.  If you have not heard from [place] then please contact us. The fastest way to get your results is to register for My Chart.   IF you received an x-ray today, you will receive an invoice from [facility]. Please contact [facility] at [phone] with questions or concerns regarding your invoice.   IF you received labwork today, you will receive an invoice from [facility]. Please contact [facility] at [phone] with questions or concerns regarding your invoice.   Our billing staff will not be able to assist you with questions regarding bills from these companies.  You will be contacted with the lab results  as soon as they are available. The fastest way to get your results is to activate your My Chart account. Instructions are located on the last page of this paperwork. If you have not heard from [place] regarding the results in 2 weeks, please contact this office.        Return in about 2 months (around 12/11/2020) for Hypertension follow-up.   Ruben Reason, MD 10/10/2020

## 2020-10-10 NOTE — Telephone Encounter (Signed)
[name] patient. - last appointment was 12/05/2019 with [name]. Patient called to schedule hospital follow up.Discharged 12/8. Spoke with Tara [name]. She will speak with [name] and get back to me. I told patient I will call her back.

## 2020-10-10 NOTE — Patient Instructions (Addendum)
I have increased your blood pressure medicine to 240 mg of diltiazem daily.  If your blood pressure remains high we can move up another notch.  Your last blood sugar a few days ago was high.  The cortisone you are on increases the risk of it going higher so it would be good for [place] to know what it is and I have ordered sugar test on you.  Continue to follow closely with your neurosurgical physicians.  See someone back for your blood pressure again in a couple of months  You are prediabetic but not in any major concern at this point.   f you have lab work done today you will be contacted with your lab results within the next 2 weeks.  If you have not heard from [place] then please contact us. The fastest way to get your results is to register for My Chart.   IF you received an x-ray today, you will receive an invoice from [facility]. Please contact [facility] at [phone] with questions or concerns regarding your invoice.   IF you received labwork today, you will receive an invoice from [facility]. Please contact [facility] at [phone] with questions or concerns regarding your invoice.   Our billing staff will not be able to assist you with questions regarding bills from these companies.  You will be contacted with the lab results as soon as they are available. The fastest way to get your results is to activate your My Chart account. Instructions are located on the last page of this paperwork. If you have not heard from [place] regarding the results in 2 weeks, please contact this office.

## 2020-10-11 ENCOUNTER — Ambulatory Visit (HOSPITAL_COMMUNITY): Payer: Medicare Other

## 2020-10-14 ENCOUNTER — Encounter: Payer: Self-pay | Admitting: Internal Medicine

## 2020-10-14 ENCOUNTER — Inpatient Hospital Stay: Payer: Medicare Other | Admitting: Internal Medicine

## 2020-10-14 ENCOUNTER — Inpatient Hospital Stay: Payer: Medicare Other | Attending: Internal Medicine

## 2020-10-15 ENCOUNTER — Inpatient Hospital Stay (HOSPITAL_BASED_OUTPATIENT_CLINIC_OR_DEPARTMENT_OTHER): Payer: Medicare Other | Admitting: Internal Medicine

## 2020-10-15 DIAGNOSIS — D8689 Sarcoidosis of other sites: Secondary | ICD-10-CM | POA: Diagnosis not present

## 2020-10-15 NOTE — Progress Notes (Signed)
I connected with Tara [name] on 10/15/20 at  2:30 PM EST by telephone visit and verified that I am speaking with the correct person using two identifiers.  I discussed the limitations, risks, security and privacy concerns of performing an evaluation and management service by telemedicine and the availability of in-person appointments. I also discussed with the patient that there may be a patient responsible charge related to this service. The patient expressed understanding and agreed to proceed.  Other persons participating in the visit and their role in the encounter:  n/a  Patient's location:  Home  Provider's location:  Office  Chief Complaint:  Neurosarcoidosis  History of Present Ilness: Tara [name] feels unchanged clinically since cessation of corticosteroids for recently uncovered spine inflammation.  Denies headache, upper respiratory symptoms have improved from prior.  Continues to have some ear discomfort. Observations: Language and cognition at baseline Assessment and Plan: Neurosarcoidosis  Given uncertainty of diagnosis and potential need for longstanding immune modulation, we recommended formal evaluation, consultation with [facility].  She is agreeable to this plan.    Consult will be placed for Dr. Erin Sons.  Follow Up Instructions: Follow up after [facility] consult or as needed  I discussed the assessment and treatment plan with the patient.  The patient was provided an opportunity to ask questions and all were answered.  The patient agreed with the plan and demonstrated understanding of the instructions.    The patient was advised to call back or seek an in-person evaluation if the symptoms worsen or if the condition fails to improve as anticipated.  I provided 5-10 minutes of non-face-to-face time during this enocunter.  [name], MD   I provided 15 minutes of non face-to-face telephone visit time during this encounter, and >  50% was spent counseling as documented under my assessment & plan.

## 2020-10-16 ENCOUNTER — Encounter (INDEPENDENT_AMBULATORY_CARE_PROVIDER_SITE_OTHER): Payer: Self-pay | Admitting: Otolaryngology

## 2020-10-16 ENCOUNTER — Ambulatory Visit (INDEPENDENT_AMBULATORY_CARE_PROVIDER_SITE_OTHER): Payer: Medicare Other | Admitting: Otolaryngology

## 2020-10-16 ENCOUNTER — Other Ambulatory Visit: Payer: Self-pay

## 2020-10-16 VITALS — Temp 97.3°F

## 2020-10-16 DIAGNOSIS — H9201 Otalgia, right ear: Secondary | ICD-10-CM

## 2020-10-16 NOTE — Progress Notes (Signed)
HPI: Tara [name] is a 69 y.o. female who presents for evaluation of right ear pain and discomfort.  This started a little over week ago with symptoms of cold congestion and headache that she thought she had a sinus infection.  She had difficulty seeing a physician and eventually went to the ED where she had a CT scan of her head that showed inflammation of the brain area.  She was subsequently admitted and started on high-dose IV steroids.  She is doing a little bit better but still having occasional sharp pain and discomfort in the right ear.  She does not feel like she hears quite as well out of the right ear.  She has had no drainage from the ear.  No past medical history on file. Past Surgical History:  Procedure Laterality Date  . APPLICATION OF CRANIAL NAVIGATION N/A 01/04/2018   Procedure: APPLICATION OF CRANIAL NAVIGATION;  Surgeon: Erline Levine, MD;  Location: [place];  Service: Neurosurgery;  Laterality: N/A;  . BRAIN SURGERY    . PR DURAL GRAFT REPAIR,SPINE DEFECT Left 01/04/2018   Procedure: Left Pterional craniotomy for biopsy with Brainlab;  Surgeon: Erline Levine, MD;  Location: [place];  Service: Neurosurgery;  Laterality: Left;  Left Pterional craniotomy for biopsy with Brainlab   Social History   Socioeconomic History  . Marital status: Married    Spouse name: Not on file  . Number of children: Not on file  . Years of education: Not on file  . Highest education level: Not on file  Occupational History  . Not on file  Tobacco Use  . Smoking status: Former Smoker    Packs/day: 1.00    Types: Cigarettes  . Smokeless tobacco: Never Used  . Tobacco comment: plans to quit now  Vaping Use  . Vaping Use: Never used  Substance and Sexual Activity  . Alcohol use: No  . Drug use: No  . Sexual activity: Yes  Other Topics Concern  . Not on file  Social History Narrative  . Not on file   Social Determinants of Health   Financial Resource Strain: Not on file  Food  Insecurity: Not on file  Transportation Needs: Not on file  Physical Activity: Not on file  Stress: Not on file  Social Connections: Not on file   Family History  Problem Relation Age of Onset  . Colon cancer Mother   . CAD Father   . Cancer Father   . Diabetes Father   . Heart disease Father   . Hypertension Father   . Diabetes Sister   . Diabetes Brother    Allergies  Allergen Reactions  . Ace Inhibitors Cough  . Ether Other (See Comments)    unknown   Prior to Admission medications   Medication Sig Start Date End Date Taking? Authorizing Provider  aspirin EC 81 MG tablet Take 1 tablet (81 mg total) by mouth daily. 01/21/18   Vaslow, Acey Lav, MD  dexamethasone (DECADRON) 4 MG tablet Take 1 tablet (4 mg total) by mouth 2 (two) times daily for 3 days, THEN 1 tablet (4 mg total) daily for 3 days. 10/10/20 10/16/20  Mariel Aloe, MD  diltiazem (CARTIA XT) 240 MG 24 hr capsule Take 1 capsule (240 mg total) by mouth daily. 10/10/20   Posey Boyer, MD  fluticasone (FLONASE) 50 MCG/ACT nasal spray Place 1 spray into both nostrils daily. 10/10/20   Mariel Aloe, MD  levETIRAcetam (KEPPRA) 250 MG tablet TAKE 1  TABLET BY MOUTH TWICE A DAY Patient taking differently: Take 250 mg by mouth 2 (two) times daily.  09/11/20   [name], MD  loratadine (CLARITIN) 10 MG tablet Take 1 tablet (10 mg total) by mouth daily as needed for allergies, rhinitis or itching. 10/09/20   Mariel Aloe, MD  Multiple Vitamin (MULTIVITAMIN WITH MINERALS) TABS tablet Take 1 tablet by mouth daily.    [provider]  Multiple Vitamins-Minerals (PRESERVISION AREDS PO) Take 1 capsule by mouth in the morning and at bedtime.     [provider]  OVER THE COUNTER MEDICATION Take 4 mLs by mouth daily as needed (cold). Cortisone 20 mg liquid    [provider]  sodium chloride (OCEAN) 0.65 % SOLN nasal spray Place 1 spray into both nostrils daily as needed for congestion.     [provider]     Positive ROS: Otherwise negative  All other systems have been reviewed and were otherwise negative with the exception of those mentioned in the HPI and as above.  Physical Exam: Constitutional: Alert, well-appearing, no acute distress Ears: External ears without lesions or tenderness.  She has minimal earwax in the right ear canal that was cleaned with a curette.  Ear canal and TM was otherwise clear.  The TM was clear with no middle ear abnormality noted and good mobility on pneumatic otoscopy.  The left ear canal and left TM are clear also.  Hearing screening with the 1024 tuning fork revealed symmetric hearing in both ears with minimal hearing loss. Nasal: External nose without lesions. Septum is mildly deviated with mild rhinitis.  After decongesting the nose both middle meatus regions were clear with no clinical evidence of mucopurulent discharge.. Clear nasal passages Oral: Lips and gums without lesions. Tongue and palate mucosa without lesions. Posterior oropharynx clear. Neck: No palpable adenopathy or masses Respiratory: Breathing comfortably  Skin: No facial/neck lesions or rash noted.  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  Plan: Did not recommend any specific therapy.  Radene Journey, MD

## 2020-10-29 DIAGNOSIS — Z20828 Contact with and (suspected) exposure to other viral communicable diseases: Secondary | ICD-10-CM | POA: Diagnosis not present

## 2020-11-21 DIAGNOSIS — G379 Demyelinating disease of central nervous system, unspecified: Secondary | ICD-10-CM | POA: Diagnosis not present

## 2020-11-28 ENCOUNTER — Encounter: Payer: Self-pay | Admitting: Internal Medicine

## 2020-12-09 ENCOUNTER — Telehealth: Payer: Self-pay | Admitting: Family Medicine

## 2020-12-09 NOTE — Telephone Encounter (Signed)
Patient wants to make sure that her provider goes over her chart  With her tomorrow / patient has an appt tomorrow with Provider Just, and has some concerns about BP medicine

## 2020-12-10 ENCOUNTER — Other Ambulatory Visit: Payer: Self-pay

## 2020-12-10 ENCOUNTER — Encounter: Payer: Self-pay | Admitting: Family Medicine

## 2020-12-10 ENCOUNTER — Ambulatory Visit (INDEPENDENT_AMBULATORY_CARE_PROVIDER_SITE_OTHER): Payer: Medicare Other | Admitting: Family Medicine

## 2020-12-10 VITALS — BP 160/87 | HR 77 | Temp 98.0°F | Ht 66.0 in | Wt 213.0 lb

## 2020-12-10 DIAGNOSIS — I2510 Atherosclerotic heart disease of native coronary artery without angina pectoris: Secondary | ICD-10-CM | POA: Diagnosis not present

## 2020-12-10 DIAGNOSIS — I10 Essential (primary) hypertension: Secondary | ICD-10-CM

## 2020-12-10 DIAGNOSIS — R739 Hyperglycemia, unspecified: Secondary | ICD-10-CM | POA: Diagnosis not present

## 2020-12-10 MED ORDER — HYDROCHLOROTHIAZIDE 25 MG PO TABS: 25.0000 mg | ORAL_TABLET | Freq: Every day | ORAL | 3 refills | Status: DC

## 2020-12-10 MED ORDER — VALSARTAN 80 MG PO TABS: 80.0000 mg | ORAL_TABLET | Freq: Every day | ORAL | 3 refills | Status: DC

## 2020-12-10 MED ORDER — ROSUVASTATIN CALCIUM 10 MG PO TABS: 10.0000 mg | ORAL_TABLET | Freq: Every day | ORAL | 3 refills | Status: DC

## 2020-12-10 NOTE — Progress Notes (Signed)
2/8/20221:42 PM  Tara [name] 1951-02-25, 70 y.o., female [id number]  Chief Complaint  Patient presents with  . Hypertension    Follow up   . A1c     Follow up     HPI:   Patient is a 70 y.o. female with past medical history significant for HTN, CVA who presents today for routine follow up.  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 MAMMOGRAM  Never done  . DEXA SCAN  Never done  . PNA vac Low Risk Adult (2 of 2 - PCV13) 01/05/2019  . COVID-19 Vaccine (3 - Pfizer risk 4-dose series) 01/11/2020  . Fecal DNA (Cologuard)  08/29/2021  . TETANUS/TDAP  03/14/2028  . INFLUENZA VACCINE  Completed  . Hepatitis C Screening  Completed     Depression screen [facility] 2/9 10/10/2020 12/05/2019 12/01/2019  Decreased Interest 0 0 0  Down, Depressed, Hopeless 0 0 0  PHQ - 2 Score 0 0 0    Fall Risk  10/10/2020 12/05/2019 12/01/2019 01/14/2019 09/12/2018  Falls in the past year? 0 '1 1 1 ' 0  Number falls in past yr: 0 0 1 0 -  Comment - - walking trip on uneven tile.   hurt arm - -  Injury with Fall? 0 0 - 1 -  Risk for fall due to : No Fall Risks - - - -  Follow up Falls evaluation completed - Falls evaluation completed;Education provided Falls evaluation completed -     Allergies  Allergen Reactions  . Ace Inhibitors Cough  . Ether Other (See Comments)    unknown    Prior to Admission medications   Medication Sig Start Date End Date Taking? Authorizing Provider  aspirin EC 81 MG tablet Take 1 tablet (81 mg total) by  mouth daily. 01/21/18  Yes Vaslow, Acey Lav, MD  diltiazem (CARTIA XT) 240 MG 24 hr capsule Take 1 capsule (240 mg total) by mouth daily. 10/10/20  Yes Posey Boyer, MD  levETIRAcetam (KEPPRA) 250 MG tablet TAKE 1 TABLET BY MOUTH TWICE A DAY Patient taking differently: Take 250 mg by mouth 2 (two) times daily. 09/11/20  Yes Vaslow, Acey Lav, MD  loratadine (CLARITIN) 10 MG tablet Take 1 tablet (10 mg total) by mouth daily as needed for allergies, rhinitis or itching. 10/09/20  Yes Mariel Aloe, MD  Multiple Vitamin (MULTIVITAMIN WITH MINERALS) TABS tablet Take 1 tablet by mouth daily.   Yes [provider]  Multiple Vitamins-Minerals (PRESERVISION AREDS PO) Take 1 capsule by mouth in the morning and at bedtime.    Yes [provider]  fluticasone (FLONASE) 50 MCG/ACT nasal spray Place 1 spray into both nostrils daily. Patient not taking: Reported on 12/10/2020 10/10/20   Mariel Aloe, MD  OVER THE COUNTER MEDICATION Take 4 mLs by mouth daily as needed (cold). Cortisone 20 mg liquid Patient not taking: Reported on 12/10/2020    [provider]  sodium chloride (  OCEAN) 0.65 % SOLN nasal spray Place 1 spray into both nostrils daily as needed for congestion. Patient not taking: Reported on 12/10/2020    [provider]    History reviewed. No pertinent past medical history.  Past Surgical History:  Procedure Laterality Date  . APPLICATION OF CRANIAL NAVIGATION N/A 01/04/2018   Procedure: APPLICATION OF CRANIAL NAVIGATION;  Surgeon: Erline Levine, MD;  Location: [facility];  Service: Neurosurgery;  Laterality: N/A;  . BRAIN SURGERY    . PR DURAL GRAFT REPAIR,SPINE DEFECT Left 01/04/2018   Procedure: Left Pterional craniotomy for biopsy with Brainlab;  Surgeon: Erline Levine, MD;  Location: [facility];  Service: Neurosurgery;  Laterality: Left;  Left Pterional craniotomy for biopsy with Brainlab    Social History   Tobacco Use  . Smoking status: Former Smoker     Packs/day: 1.00    Types: Cigarettes  . Smokeless tobacco: Never Used  . Tobacco comment: plans to quit now  Substance Use Topics  . Alcohol use: No    Family History  Problem Relation Age of Onset  . Colon cancer Mother   . CAD Father   . Cancer Father   . Diabetes Father   . Heart disease Father   . Hypertension Father   . Diabetes Sister   . Diabetes Brother     Review of Systems  Constitutional: Negative for chills, fever and malaise/fatigue.  Eyes: Negative for blurred vision and double vision.  Respiratory: Negative for cough, shortness of breath and wheezing.   Cardiovascular: Negative for chest pain, palpitations and leg swelling.  Gastrointestinal: Negative for abdominal pain, blood in stool, constipation, diarrhea, heartburn, nausea and vomiting.  Genitourinary: Negative for dysuria, frequency and hematuria.  Musculoskeletal: Negative for back pain and joint pain.  Skin: Negative for rash.  Neurological: Negative for dizziness, weakness and headaches.  Psychiatric/Behavioral: Positive for memory loss.     OBJECTIVE:  Today's Vitals   12/10/20 0928  BP: (!) 160/87  Pulse: 77  Temp: 98 F (36.7 C)  SpO2: 96%  Weight: 213 lb (96.6 kg)  Height: '5\' 6"'  (1.676 m)   Body mass index is 34.38 kg/m.   Physical Exam Constitutional:      General: She is not in acute distress.    Appearance: Normal appearance. She is not ill-appearing.  HENT:     Head: Normocephalic.  Cardiovascular:     Rate and Rhythm: Normal rate and regular rhythm.     Pulses: Normal pulses.     Heart sounds: Normal heart sounds. No murmur heard. No friction rub. No gallop.   Pulmonary:     Effort: Pulmonary effort is normal. No respiratory distress.     Breath sounds: Normal breath sounds. No stridor. No wheezing, rhonchi or rales.  Abdominal:     General: Bowel sounds are normal.     Palpations: Abdomen is soft.     Tenderness: There is no abdominal tenderness.  Musculoskeletal:      Right lower leg: No edema.     Left lower leg: No edema.  Skin:    General: Skin is warm and dry.  Neurological:     Mental Status: She is alert and oriented to person, place, and time.  Psychiatric:        Mood and Affect: Mood normal.        Behavior: Behavior normal.     No results found for this or any previous visit (from the past 24 hour(s)).  No results found.  ASSESSMENT and PLAN  Problem List Items Addressed This Visit      Other   Hyperglycemia   Relevant Orders   Hemoglobin A1c    Other Visit Diagnoses    Essential hypertension    -  Primary   Relevant Medications   valsartan (DIOVAN) 80 MG tablet   rosuvastatin (CRESTOR) 10 MG tablet   hydrochlorothiazide (HYDRODIURIL) 25 MG tablet   Other Relevant Orders   CMP14+EGFR   Atherosclerotic cardiovascular disease       Relevant Medications   valsartan (DIOVAN) 80 MG tablet   rosuvastatin (CRESTOR) 10 MG tablet   hydrochlorothiazide (HYDRODIURIL) 25 MG tablet   Other Relevant Orders   Lipid Panel   Amb ref to Medical Nutrition Therapy-MNT     Plan . Will follow up with BP in 2 weeks . Starting HCTZ and Valsartan, will discuss in the future DC diltiazem . Due to HLD will start crestor daily . Will follow up with lab results . Discuss mammogram and dexa next visit   Return in about 2 weeks (around 12/24/2020).    Tara Foley Mercades Bajaj, FNP-BC Primary [facility] at [facility] [place], [place] [address] Ph.  [phone] Fax [phone]

## 2020-12-10 NOTE — Patient Instructions (Signed)
High Cholesterol  High cholesterol is a condition in which the blood has high levels of a white, waxy substance similar to fat (cholesterol). The liver makes all the cholesterol that the body needs. The human body needs small amounts of cholesterol to help build cells. A person gets extra or excess cholesterol from the food that he or she eats. The blood carries cholesterol from the liver to the rest of the body. If you have high cholesterol, deposits (plaques) may build up on the walls of your arteries. Arteries are the blood vessels that carry blood away from your heart. These plaques make the arteries narrow and stiff. Cholesterol plaques increase your risk for heart attack and stroke. Work with your health care provider to keep your cholesterol levels in a healthy range. What increases the risk? The following factors may make you more likely to develop this condition:  Eating foods that are high in animal fat (saturated fat) or cholesterol.  Being overweight.  Not getting enough exercise.  A family history of high cholesterol (familial hypercholesterolemia).  Use of tobacco products.  Having diabetes. What are the signs or symptoms? There are no symptoms of this condition. How is this diagnosed? This condition may be diagnosed based on the results of a blood test.  If you are older than 70 years of age, your health care provider may check your cholesterol levels every 4-6 years.  You may be checked more often if you have high cholesterol or other risk factors for heart disease. The blood test for cholesterol measures:  "Bad" cholesterol, or LDL cholesterol. This is the main type of cholesterol that causes heart disease. The desired level is less than 100 mg/dL.  "Good" cholesterol, or HDL cholesterol. HDL helps protect against heart disease by cleaning the arteries and carrying the LDL to the liver for processing. The desired level for HDL is 60 mg/dL or higher.  Triglycerides.  These are fats that your body can store or burn for energy. The desired level is less than 150 mg/dL.  Total cholesterol. This measures the total amount of cholesterol in your blood and includes LDL, HDL, and triglycerides. The desired level is less than 200 mg/dL. How is this treated? This condition may be treated with:  Diet changes. You may be asked to eat foods that have more fiber and less saturated fats or added sugar.  Lifestyle changes. These may include regular exercise, maintaining a healthy weight, and quitting use of tobacco products.  Medicines. These are given when diet and lifestyle changes have not worked. You may be prescribed a statin medicine to help lower your cholesterol levels. Follow these instructions at home: Eating and drinking  Eat a healthy, balanced diet. This diet includes: ? Daily servings of a variety of fresh, frozen, or canned fruits and vegetables. ? Daily servings of whole grain foods that are rich in fiber. ? Foods that are low in saturated fats and trans fats. These include poultry and fish without skin, lean cuts of meat, and low-fat dairy products. ? A variety of fish, especially oily fish that contain omega-3 fatty acids. Aim to eat fish at least 2 times a week.  Avoid foods and drinks that have added sugar.  Use healthy cooking methods, such as roasting, grilling, broiling, baking, poaching, steaming, and stir-frying. Do not fry your food except for stir-frying.   Lifestyle  Get regular exercise. Aim to exercise for a total of 150 minutes a week. Increase your activity level by doing activities   such as gardening, walking, and taking the stairs.  Do not use any products that contain nicotine or tobacco, such as cigarettes, e-cigarettes, and chewing tobacco. If you need help quitting, ask your health care provider.   General instructions  Take over-the-counter and prescription medicines only as told by your health care provider.  Keep all  follow-up visits as told by your health care provider. This is important. Where to find more information  American Heart Association: www.heart.org  National Heart, Lung, and Blood Institute: www.nhlbi.nih.gov Contact a health care provider if:  You have trouble achieving or maintaining a healthy diet or weight.  You are starting an exercise program.  You are unable to stop smoking. Get help right away if:  You have chest pain.  You have trouble breathing.  You have any symptoms of a stroke. "BE FAST" is an easy way to remember the main warning signs of a stroke: ? B - Balance. Signs are dizziness, sudden trouble walking, or loss of balance. ? E - Eyes. Signs are trouble seeing or a sudden change in vision. ? F - Face. Signs are sudden weakness or numbness of the face, or the face or eyelid drooping on one side. ? A - Arms. Signs are weakness or numbness in an arm. This happens suddenly and usually on one side of the body. ? S - Speech. Signs are sudden trouble speaking, slurred speech, or trouble understanding what people say. ? T - Time. Time to call emergency services. Write down what time symptoms started.  You have other signs of a stroke, such as: ? A sudden, severe headache with no known cause. ? Nausea or vomiting. ? Seizure. These symptoms may represent a serious problem that is an emergency. Do not wait to see if the symptoms will go away. Get medical help right away. Call your local emergency services (911 in the [place] Do not drive yourself to the hospital. Summary  Cholesterol plaques increase your risk for heart attack and stroke. Work with your health care provider to keep your cholesterol levels in a healthy range.  Eat a healthy, balanced diet, get regular exercise, and maintain a healthy weight.  Do not use any products that contain nicotine or tobacco, such as cigarettes, e-cigarettes, and chewing tobacco.  Get help right away if you have any symptoms of a  stroke. This information is not intended to replace advice given to you by your health care provider. Make sure you discuss any questions you have with your health care provider. Document Revised: 09/18/2019 Document Reviewed: 09/18/2019 Elsevier Patient Education  2021 Elsevier Inc. Hypertension, Adult Hypertension is another name for high blood pressure. High blood pressure forces your heart to work harder to pump blood. This can cause problems over time. There are two numbers in a blood pressure reading. There is a top number (systolic) over a bottom number (diastolic). It is best to have a blood pressure that is below 120/80. Healthy choices can help lower your blood pressure, or you may need medicine to help lower it. What are the causes? The cause of this condition is not known. Some conditions may be related to high blood pressure. What increases the risk?  Smoking.  Having type 2 diabetes mellitus, high cholesterol, or both.  Not getting enough exercise or physical activity.  Being overweight.  Having too much fat, sugar, calories, or salt (sodium) in your diet.  Drinking too much alcohol.  Having long-term (chronic) kidney disease.  Having a family history   of high blood pressure.  Age. Risk increases with age.  Race. You may be at higher risk if you are African American.  Gender. Men are at higher risk than women before age 45. After age 65, women are at higher risk than men.  Having obstructive sleep apnea.  Stress. What are the signs or symptoms?  High blood pressure may not cause symptoms. Very high blood pressure (hypertensive crisis) may cause: ? Headache. ? Feelings of worry or nervousness (anxiety). ? Shortness of breath. ? Nosebleed. ? A feeling of being sick to your stomach (nausea). ? Throwing up (vomiting). ? Changes in how you see. ? Very bad chest pain. ? Seizures. How is this treated?  This condition is treated by making healthy lifestyle  changes, such as: ? Eating healthy foods. ? Exercising more. ? Drinking less alcohol.  Your health care provider may prescribe medicine if lifestyle changes are not enough to get your blood pressure under control, and if: ? Your top number is above 130. ? Your bottom number is above 80.  Your personal target blood pressure may vary. Follow these instructions at home: Eating and drinking  If told, follow the DASH eating plan. To follow this plan: ? Fill one half of your plate at each meal with fruits and vegetables. ? Fill one fourth of your plate at each meal with whole grains. Whole grains include whole-wheat pasta, brown rice, and whole-grain bread. ? Eat or drink low-fat dairy products, such as skim milk or low-fat yogurt. ? Fill one fourth of your plate at each meal with low-fat (lean) proteins. Low-fat proteins include fish, chicken without skin, eggs, beans, and tofu. ? Avoid fatty meat, cured and processed meat, or chicken with skin. ? Avoid pre-made or processed food.  Eat less than 1,500 mg of salt each day.  Do not drink alcohol if: ? Your doctor tells you not to drink. ? You are pregnant, may be pregnant, or are planning to become pregnant.  If you drink alcohol: ? Limit how much you use to:  0-1 drink a day for women.  0-2 drinks a day for men. ? Be aware of how much alcohol is in your drink. In the [place] one drink equals one 12 oz bottle of beer (355 mL), one 5 oz glass of wine (148 mL), or one 1 oz glass of hard liquor (44 mL).   Lifestyle  Work with your doctor to stay at a healthy weight or to lose weight. Ask your doctor what the best weight is for you.  Get at least 30 minutes of exercise most days of the week. This may include walking, swimming, or biking.  Get at least 30 minutes of exercise that strengthens your muscles (resistance exercise) at least 3 days a week. This may include lifting weights or doing Pilates.  Do not use any products that contain  nicotine or tobacco, such as cigarettes, e-cigarettes, and chewing tobacco. If you need help quitting, ask your doctor.  Check your blood pressure at home as told by your doctor.  Keep all follow-up visits as told by your doctor. This is important.   Medicines  Take over-the-counter and prescription medicines only as told by your doctor. Follow directions carefully.  Do not skip doses of blood pressure medicine. The medicine does not work as well if you skip doses. Skipping doses also puts you at risk for problems.  Ask your doctor about side effects or reactions to medicines that you should watch for.   Contact a doctor if you:  Think you are having a reaction to the medicine you are taking.  Have headaches that keep coming back (recurring).  Feel dizzy.  Have swelling in your ankles.  Have trouble with your vision. Get help right away if you:  Get a very bad headache.  Start to feel mixed up (confused).  Feel weak or numb.  Feel faint.  Have very bad pain in your: ? Chest. ? Belly (abdomen).  Throw up more than once.  Have trouble breathing. Summary  Hypertension is another name for high blood pressure.  High blood pressure forces your heart to work harder to pump blood.  For most people, a normal blood pressure is less than 120/80.  Making healthy choices can help lower blood pressure. If your blood pressure does not get lower with healthy choices, you may need to take medicine. This information is not intended to replace advice given to you by your health care provider. Make sure you discuss any questions you have with your health care provider. Document Revised: 06/29/2018 Document Reviewed: 06/29/2018 Elsevier Patient Education  2021 Elsevier Inc.  

## 2020-12-11 LAB — LIPID PANEL
Chol/HDL Ratio: 3.6 ratio (ref 0.0–4.4)
Cholesterol, Total: 226 mg/dL — ABNORMAL HIGH (ref 100–199)
HDL: 62 mg/dL (ref >39)
LDL Chol Calc (NIH): 147 mg/dL — ABNORMAL HIGH (ref 0–99)
Triglycerides: 98 mg/dL (ref 0–149)
VLDL Cholesterol Cal: 17 mg/dL (ref 5–40)

## 2020-12-11 LAB — CMP14+EGFR
ALT: 18 IU/L (ref 0–32)
AST: 16 IU/L (ref 0–40)
Albumin/Globulin Ratio: 1.9 (ref 1.2–2.2)
Albumin: 4.5 g/dL (ref 3.8–4.8)
Alkaline Phosphatase: 161 IU/L — ABNORMAL HIGH (ref 44–121)
BUN/Creatinine Ratio: 38 — ABNORMAL HIGH (ref 12–28)
BUN: 20 mg/dL (ref 8–27)
Bilirubin Total: 0.4 mg/dL (ref 0.0–1.2)
CO2: 24 mmol/L (ref 20–29)
Calcium: 9.9 mg/dL (ref 8.7–10.3)
Chloride: 101 mmol/L (ref 96–106)
Creatinine, Ser: 0.52 mg/dL — ABNORMAL LOW (ref 0.57–1.00)
GFR calc Af Amer: 113 mL/min/{1.73_m2} (ref >59)
GFR calc non Af Amer: 98 mL/min/{1.73_m2} (ref >59)
Globulin, Total: 2.4 g/dL (ref 1.5–4.5)
Glucose: 94 mg/dL (ref 65–99)
Potassium: 4.8 mmol/L (ref 3.5–5.2)
Sodium: 139 mmol/L (ref 134–144)
Total Protein: 6.9 g/dL (ref 6.0–8.5)

## 2020-12-11 LAB — HEMOGLOBIN A1C
Est. average glucose Bld gHb Est-mCnc: 123 mg/dL
Hgb A1c MFr Bld: 5.9 % — ABNORMAL HIGH (ref 4.8–5.6)

## 2020-12-24 ENCOUNTER — Ambulatory Visit (INDEPENDENT_AMBULATORY_CARE_PROVIDER_SITE_OTHER): Payer: Medicare Other | Admitting: Family Medicine

## 2020-12-24 ENCOUNTER — Other Ambulatory Visit: Payer: Self-pay

## 2020-12-24 ENCOUNTER — Encounter: Payer: Self-pay | Admitting: Family Medicine

## 2020-12-24 VITALS — BP 134/84 | HR 93 | Temp 97.9°F | Ht 66.0 in | Wt 209.8 lb

## 2020-12-24 DIAGNOSIS — E2839 Other primary ovarian failure: Secondary | ICD-10-CM | POA: Diagnosis not present

## 2020-12-24 DIAGNOSIS — Z1231 Encounter for screening mammogram for malignant neoplasm of breast: Secondary | ICD-10-CM | POA: Diagnosis not present

## 2020-12-24 NOTE — Patient Instructions (Addendum)
  Hypertension, Adult Hypertension is another name for high blood pressure. High blood pressure forces your heart to work harder to pump blood. This can cause problems over time. There are two numbers in a blood pressure reading. There is a top number (systolic) over a bottom number (diastolic). It is best to have a blood pressure that is below 120/80. Healthy choices can help lower your blood pressure, or you may need medicine to help lower it. What are the causes? The cause of this condition is not known. Some conditions may be related to high blood pressure. What increases the risk?  Smoking.  Having type 2 diabetes mellitus, high cholesterol, or both.  Not getting enough exercise or physical activity.  Being overweight.  Having too much fat, sugar, calories, or salt (sodium) in your diet.  Drinking too much alcohol.  Having long-term (chronic) kidney disease.  Having a family history of high blood pressure.  Age. Risk increases with age.  Race. You may be at higher risk if you are African American.  Gender. Men are at higher risk than women before age 45. After age 65, women are at higher risk than men.  Having obstructive sleep apnea.  Stress. What are the signs or symptoms?  High blood pressure may not cause symptoms. Very high blood pressure (hypertensive crisis) may cause: ? Headache. ? Feelings of worry or nervousness (anxiety). ? Shortness of breath. ? Nosebleed. ? A feeling of being sick to your stomach (nausea). ? Throwing up (vomiting). ? Changes in how you see. ? Very bad chest pain. ? Seizures. How is this treated?  This condition is treated by making healthy lifestyle changes, such as: ? Eating healthy foods. ? Exercising more. ? Drinking less alcohol.  Your health care provider may prescribe medicine if lifestyle changes are not enough to get your blood pressure under control, and if: ? Your top number is above 130. ? Your bottom number is  above 80.  Your personal target blood pressure may vary. Follow these instructions at home: Eating and drinking  If told, follow the DASH eating plan. To follow this plan: ? Fill one half of your plate at each meal with fruits and vegetables. ? Fill one fourth of your plate at each meal with whole grains. Whole grains include whole-wheat pasta, brown rice, and whole-grain bread. ? Eat or drink low-fat dairy products, such as skim milk or low-fat yogurt. ? Fill one fourth of your plate at each meal with low-fat (lean) proteins. Low-fat proteins include fish, chicken without skin, eggs, beans, and tofu. ? Avoid fatty meat, cured and processed meat, or chicken with skin. ? Avoid pre-made or processed food.  Eat less than 1,500 mg of salt each day.  Do not drink alcohol if: ? Your doctor tells you not to drink. ? You are pregnant, may be pregnant, or are planning to become pregnant.  If you drink alcohol: ? Limit how much you use to:  0-1 drink a day for women.  0-2 drinks a day for men. ? Be aware of how much alcohol is in your drink. In the [place] one drink equals one 12 oz bottle of beer (355 mL), one 5 oz glass of wine (148 mL), or one 1 oz glass of hard liquor (44 mL).   Lifestyle  Work with your doctor to stay at a healthy weight or to lose weight. Ask your doctor what the best weight is for you.  Get at least 30 minutes of   exercise most days of the week. This may include walking, swimming, or biking.  Get at least 30 minutes of exercise that strengthens your muscles (resistance exercise) at least 3 days a week. This may include lifting weights or doing Pilates.  Do not use any products that contain nicotine or tobacco, such as cigarettes, e-cigarettes, and chewing tobacco. If you need help quitting, ask your doctor.  Check your blood pressure at home as told by your doctor.  Keep all follow-up visits as told by your doctor. This is important.   Medicines  Take  over-the-counter and prescription medicines only as told by your doctor. Follow directions carefully.  Do not skip doses of blood pressure medicine. The medicine does not work as well if you skip doses. Skipping doses also puts you at risk for problems.  Ask your doctor about side effects or reactions to medicines that you should watch for. Contact a doctor if you:  Think you are having a reaction to the medicine you are taking.  Have headaches that keep coming back (recurring).  Feel dizzy.  Have swelling in your ankles.  Have trouble with your vision. Get help right away if you:  Get a very bad headache.  Start to feel mixed up (confused).  Feel weak or numb.  Feel faint.  Have very bad pain in your: ? Chest. ? Belly (abdomen).  Throw up more than once.  Have trouble breathing. Summary  Hypertension is another name for high blood pressure.  High blood pressure forces your heart to work harder to pump blood.  For most people, a normal blood pressure is less than 120/80.  Making healthy choices can help lower blood pressure. If your blood pressure does not get lower with healthy choices, you may need to take medicine. This information is not intended to replace advice given to you by your health care provider. Make sure you discuss any questions you have with your health care provider. Document Revised: 06/29/2018 Document Reviewed: 06/29/2018 Elsevier Patient Education  2021 [employer]vier Inc.   If you have lab work done today you will be contacted with your lab results within the next 2 weeks.  If you have not heard from us then please contact us. The fastest way to get your results is to register for My Chart.   IF you received an x-ray today, you will receive an invoice from [facility]. Please contact [facility] at [phone] with questions or concerns regarding your invoice.   IF you received labwork today, you will receive an invoice from  [facility]. Please contact [facility] at [phone] with questions or concerns regarding your invoice.   Our billing staff will not be able to assist you with questions regarding bills from these companies.  You will be contacted with the lab results as soon as they are available. The fastest way to get your results is to activate your My Chart account. Instructions are located on the last page of this paperwork. If you have not heard from us regarding the results in 2 weeks, please contact this office.      

## 2020-12-24 NOTE — Progress Notes (Signed)
2/22/20228:56 AM  Tara [name] [birth year], 70 y.o., female [id number]  Chief Complaint  Patient presents with  . Medical Management of Chronic Issues    2 week f.u   . Stress    HPI:   Patient is a 70 y.o. female with past medical history significant for HTN, CVA who presents today for routine follow up.  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 MAMMOGRAM  Never done  . DEXA SCAN  Never done  . PNA vac Low Risk Adult (2 of 2 - PCV13) 01/05/2019  . COVID-19 Vaccine (3 - Pfizer risk 4-dose series) 01/11/2020  . Fecal DNA (Cologuard)  08/29/2021  . TETANUS/TDAP  03/14/2028  . INFLUENZA VACCINE  Completed  . Hepatitis C Screening  Completed     Depression screen [facility] 2/9 12/24/2020 10/10/2020 12/05/2019  Decreased Interest 0 0 0  Down, Depressed, Hopeless 0 0 0  PHQ - 2 Score 0 0 0    Fall Risk  12/24/2020 10/10/2020 12/05/2019 12/01/2019 01/14/2019  Falls in the past year? 0 0 1 1 1   Number falls in past yr: 0 0 0 1 0  Comment - - - walking  trip on uneven tile.   hurt arm -  Injury with Fall? 0 0 0 - 1  Risk for fall due to : - No Fall Risks - - -  Follow up Falls evaluation completed Falls evaluation completed - Falls evaluation completed;Education provided Falls evaluation completed     Allergies  Allergen Reactions  . Ace Inhibitors Cough  . Ether Other (See Comments)    unknown    Prior to Admission medications   Medication Sig Start Date End Date Taking? Authorizing Provider  aspirin EC 81 MG tablet Take 1 tablet (81 mg total) by mouth daily. 01/21/18  Yes Vaslow, Acey Lav, MD  diltiazem (CARTIA XT) 240 MG 24 hr capsule Take 1 capsule (240 mg total) by mouth daily. 10/10/20  Yes Posey Boyer, MD  levETIRAcetam (KEPPRA) 250 MG tablet TAKE 1 TABLET BY MOUTH TWICE A DAY Patient taking differently: Take 250 mg by mouth 2 (two) times daily. 09/11/20  Yes Vaslow, Acey Lav, MD  loratadine (CLARITIN) 10 MG tablet Take 1 tablet (10 mg total) by mouth daily as needed for allergies, rhinitis or itching. 10/09/20  Yes Mariel Aloe, MD  Multiple Vitamin (MULTIVITAMIN WITH MINERALS) TABS tablet Take 1 tablet by mouth daily.   Yes [provider]  Multiple Vitamins-Minerals (PRESERVISION AREDS PO) Take 1 capsule by mouth in the morning and at bedtime.    Yes [provider]  fluticasone (FLONASE) 50 MCG/ACT nasal spray Place 1 spray into both nostrils daily. Patient not taking: Reported on 12/10/2020 10/10/20   Mariel Aloe, MD  OVER THE COUNTER MEDICATION Take 4 mLs by mouth daily as needed (cold). Cortisone 20 mg liquid Patient not taking: Reported on 12/10/2020    [provider]  sodium chloride (OCEAN) 0.65 % SOLN nasal spray Place 1 spray into both nostrils daily as needed for congestion. Patient not taking: Reported on 12/10/2020    [provider]    History reviewed. No pertinent past medical history.  Past Surgical History:  Procedure Laterality Date  . APPLICATION OF CRANIAL  NAVIGATION N/A 01/04/2018   Procedure: APPLICATION OF CRANIAL NAVIGATION;  Surgeon: Erline Levine, MD;  Location: [place];  Service: Neurosurgery;  Laterality: N/A;  . BRAIN SURGERY    . PR DURAL GRAFT REPAIR,SPINE DEFECT Left 01/04/2018   Procedure: Left Pterional craniotomy for biopsy with Brainlab;  Surgeon: Erline Levine, MD;  Location: [place];  Service: Neurosurgery;  Laterality: Left;  Left Pterional craniotomy for biopsy with Brainlab    Social History   Tobacco Use  . Smoking status: Former Smoker    Packs/day: 1.00    Types: Cigarettes  . Smokeless tobacco: Never Used  . Tobacco comment: plans to quit now  Substance Use Topics  . Alcohol use: No    Family History  Problem Relation Age of Onset  . Colon cancer Mother   . CAD Father   . Cancer Father   . Diabetes Father   . Heart disease Father   . Hypertension Father   . Diabetes Sister   . Diabetes Brother     Review of Systems  Constitutional: Negative for chills, fever and malaise/fatigue.  Eyes: Negative for blurred vision and double vision.  Respiratory: Negative for cough, shortness of breath and wheezing.   Cardiovascular: Negative for chest pain, palpitations and leg swelling.  Gastrointestinal: Negative for abdominal pain, blood in stool, constipation, diarrhea, heartburn, nausea and vomiting.  Genitourinary: Negative for dysuria, frequency and hematuria.  Musculoskeletal: Negative for back pain and joint pain.  Skin: Negative for rash.  Neurological: Negative for dizziness, weakness and headaches.  Psychiatric/Behavioral: Positive for memory loss.     OBJECTIVE:  Today's Vitals   12/24/20 0834 12/24/20 0838  BP: (!) 150/83 134/84  Pulse: 93   Temp: 97.9 F (36.6 C)   TempSrc: Temporal   SpO2: 96%   Weight: 209 lb 12.8 oz (95.2 kg)   Height: 5\' 6"  (1.676 m)    Body mass index is 33.86 kg/m.   Physical Exam Constitutional:      General: She is not in acute distress.    Appearance: Normal  appearance. She is not ill-appearing.  HENT:     Head: Normocephalic.  Cardiovascular:     Rate and Rhythm: Normal rate and regular rhythm.     Pulses: Normal pulses.     Heart sounds: Normal heart sounds. No murmur heard. No friction rub. No gallop.   Pulmonary:     Effort: Pulmonary effort is normal. No respiratory distress.     Breath sounds: Normal breath sounds. No stridor. No wheezing, rhonchi or rales.  Abdominal:     General: Bowel sounds are normal.     Palpations: Abdomen is soft.     Tenderness: There is no abdominal  tenderness.  Musculoskeletal:     Right lower leg: No edema.     Left lower leg: No edema.  Skin:    General: Skin is warm and dry.  Neurological:     Mental Status: She is alert and oriented to person, place, and time.  Psychiatric:        Mood and Affect: Mood normal.        Behavior: Behavior normal.     No results found for this or any previous visit (from the past 24 hour(s)).  No results found.   ASSESSMENT and PLAN  Problem List Items Addressed This Visit   None   Visit Diagnoses    Encounter for screening mammogram for malignant neoplasm of breast    -  Primary   Relevant Orders   MM 3D SCREEN BREAST BILATERAL   Estrogen deficiency       Relevant Orders   DG Bone Density     Plan . Continue to work on LFM . Chronic conditions stable on current regimens . Will follow up on labs in 3 months    Return in about 3 months (around 03/23/2021).    Huston Foley Salman Wellen, FNP-BC Primary Care at [facility] [place], [place] [address] Ph.  [phone] Fax [phone]

## 2021-01-14 ENCOUNTER — Encounter: Payer: Medicare Other | Admitting: Family Medicine

## 2021-03-06 ENCOUNTER — Telehealth: Payer: Self-pay | Admitting: *Deleted

## 2021-03-06 NOTE — Telephone Encounter (Signed)
Patients son Bernyce Brimley called to report new severe back pain that came and went yesterday.  Reports patient has started some new physical activities.  Slept well over the night and so far is feeling fine this morning.  Wanted to let Dr Mickeal Skinner know in case it was concerning.  He stated to just watch but felt that hopefully it is resolved now.  Son also reported that they saw [facility] and several tests were performed.  Gave Dr Mickeal Skinner the report from [facility] 11/21/2020.  He reviewed and said no need to biopsy at this time.  Will continue to monitor patient under surveillance.

## 2021-03-12 DIAGNOSIS — D8689 Sarcoidosis of other sites: Secondary | ICD-10-CM | POA: Diagnosis not present

## 2021-03-12 DIAGNOSIS — G379 Demyelinating disease of central nervous system, unspecified: Secondary | ICD-10-CM | POA: Diagnosis not present

## 2021-03-25 DIAGNOSIS — H1045 Other chronic allergic conjunctivitis: Secondary | ICD-10-CM | POA: Diagnosis not present

## 2021-03-25 DIAGNOSIS — H26493 Other secondary cataract, bilateral: Secondary | ICD-10-CM | POA: Diagnosis not present

## 2021-03-25 DIAGNOSIS — H353132 Nonexudative age-related macular degeneration, bilateral, intermediate dry stage: Secondary | ICD-10-CM | POA: Diagnosis not present

## 2021-03-25 DIAGNOSIS — H5347 Heteronymous bilateral field defects: Secondary | ICD-10-CM | POA: Diagnosis not present

## 2021-03-31 ENCOUNTER — Other Ambulatory Visit: Payer: Self-pay | Admitting: Family Medicine

## 2021-03-31 DIAGNOSIS — I10 Essential (primary) hypertension: Secondary | ICD-10-CM

## 2021-04-01 ENCOUNTER — Other Ambulatory Visit: Payer: Self-pay | Admitting: Internal Medicine

## 2021-04-01 ENCOUNTER — Other Ambulatory Visit: Payer: Self-pay | Admitting: Radiation Therapy

## 2021-04-01 DIAGNOSIS — D8689 Sarcoidosis of other sites: Secondary | ICD-10-CM

## 2021-04-02 ENCOUNTER — Ambulatory Visit: Payer: Medicare Other | Admitting: Family Medicine

## 2021-04-02 ENCOUNTER — Ambulatory Visit (HOSPITAL_COMMUNITY)
Admission: RE | Admit: 2021-04-02 | Discharge: 2021-04-02 | Disposition: A | Discharge status: Home / Self Care | Payer: Medicare Other | Source: Ambulatory Visit | Attending: Internal Medicine | Admitting: Internal Medicine

## 2021-04-02 ENCOUNTER — Other Ambulatory Visit: Payer: Self-pay

## 2021-04-02 DIAGNOSIS — R569 Unspecified convulsions: Secondary | ICD-10-CM | POA: Insufficient documentation

## 2021-04-02 DIAGNOSIS — D8689 Sarcoidosis of other sites: Secondary | ICD-10-CM

## 2021-04-02 DIAGNOSIS — G9389 Other specified disorders of brain: Secondary | ICD-10-CM | POA: Diagnosis not present

## 2021-04-02 DIAGNOSIS — M4802 Spinal stenosis, cervical region: Secondary | ICD-10-CM | POA: Diagnosis not present

## 2021-04-02 DIAGNOSIS — M47812 Spondylosis without myelopathy or radiculopathy, cervical region: Secondary | ICD-10-CM | POA: Diagnosis not present

## 2021-04-02 DIAGNOSIS — M4312 Spondylolisthesis, cervical region: Secondary | ICD-10-CM | POA: Diagnosis not present

## 2021-04-02 DIAGNOSIS — M2578 Osteophyte, vertebrae: Secondary | ICD-10-CM | POA: Diagnosis not present

## 2021-04-02 MED ORDER — GADOBUTROL 1 MMOL/ML IV SOLN
9.0000 mL | Freq: Once | INTRAVENOUS | Status: AC | PRN
  Administered 2021-04-02: 9 mL via INTRAVENOUS

## 2021-04-07 ENCOUNTER — Inpatient Hospital Stay: Payer: Medicare Other

## 2021-04-07 ENCOUNTER — Inpatient Hospital Stay: Payer: Medicare Other | Attending: Internal Medicine | Admitting: Internal Medicine

## 2021-04-07 ENCOUNTER — Ambulatory Visit: Payer: Medicare Other | Admitting: Internal Medicine

## 2021-04-07 ENCOUNTER — Other Ambulatory Visit: Payer: Self-pay

## 2021-04-07 VITALS — BP 133/79 | HR 71 | Temp 97.9°F | Resp 18 | Wt 205.8 lb

## 2021-04-07 DIAGNOSIS — I10 Essential (primary) hypertension: Secondary | ICD-10-CM | POA: Diagnosis not present

## 2021-04-07 DIAGNOSIS — G939 Disorder of brain, unspecified: Secondary | ICD-10-CM | POA: Insufficient documentation

## 2021-04-07 DIAGNOSIS — Z8673 Personal history of transient ischemic attack (TIA), and cerebral infarction without residual deficits: Secondary | ICD-10-CM | POA: Diagnosis not present

## 2021-04-07 DIAGNOSIS — Z79899 Other long term (current) drug therapy: Secondary | ICD-10-CM | POA: Diagnosis not present

## 2021-04-07 DIAGNOSIS — G373 Acute transverse myelitis in demyelinating disease of central nervous system: Secondary | ICD-10-CM | POA: Diagnosis not present

## 2021-04-07 DIAGNOSIS — Z87891 Personal history of nicotine dependence: Secondary | ICD-10-CM | POA: Insufficient documentation

## 2021-04-07 DIAGNOSIS — Z7982 Long term (current) use of aspirin: Secondary | ICD-10-CM | POA: Diagnosis not present

## 2021-04-07 DIAGNOSIS — D8689 Sarcoidosis of other sites: Secondary | ICD-10-CM | POA: Diagnosis not present

## 2021-04-07 MED ORDER — DILTIAZEM HCL ER COATED BEADS 240 MG PO CP24: 240.0000 mg | ORAL_CAPSULE | Freq: Every day | ORAL | 1 refills | Status: DC

## 2021-04-07 NOTE — Progress Notes (Signed)
[facility] [place], [place] [address] [phone]   Interval Evaluation  Date of Service: 04/07/21 Patient Name: Tara [name] Patient MRN: [id number] Patient DOB: 02-18-1951 Provider: [name], MD  Identifying Statement:  Tara [name] is a 70 y.o. female with left frontal brain lesion, transverse myelitis  Interval History:  Tara [name] presents today for follow up after recent imaging of brain and cervical spine.  She has no complaints today, remains active and independent.  She describes no new or progressive neurologic symptoms aside from mild word finding/expression difficulty.  No recent headaches.   Medications: Current Outpatient Medications on File Prior to Visit  Medication Sig Dispense Refill  . aspirin EC 81 MG tablet Take 1 tablet (81 mg total) by mouth daily.    Marland Kitchen diltiazem (CARTIA XT) 240 MG 24 hr capsule Take 1 capsule (240 mg total) by mouth daily. 90 capsule 1  . fluticasone (FLONASE) 50 MCG/ACT nasal spray Place 1 spray into both nostrils daily. (Patient not taking: No sig reported) 16 g 0  . hydrochlorothiazide (HYDRODIURIL) 25 MG tablet Take 1 tablet (25 mg total) by mouth daily. 90 tablet 3  . levETIRAcetam (KEPPRA) 250 MG tablet TAKE 1 TABLET BY MOUTH TWICE A DAY (Patient taking differently: Take 250 mg by mouth 2 (two) times daily.) 180 tablet 1  . loratadine (CLARITIN) 10 MG tablet Take 1 tablet (10 mg total) by mouth daily as needed for allergies, rhinitis or itching. (Patient not taking: Reported on 12/24/2020) 30 tablet 0  . Multiple Vitamin (MULTIVITAMIN WITH MINERALS) TABS tablet Take 1 tablet by mouth daily.    . Multiple Vitamins-Minerals (PRESERVISION AREDS PO) Take 1 capsule by mouth in the morning and at bedtime.     Marland Kitchen OVER THE COUNTER MEDICATION Take 4 mLs by mouth daily as needed (cold). Cortisone 20 mg liquid (Patient not taking: No sig reported)    . rosuvastatin  (CRESTOR) 10 MG tablet Take 1 tablet (10 mg total) by mouth daily. 90 tablet 3  . sodium chloride (OCEAN) 0.65 % SOLN nasal spray Place 1 spray into both nostrils daily as needed for congestion. (Patient not taking: No sig reported)    . valsartan (DIOVAN) 80 MG tablet Take 1 tablet (80 mg total) by mouth daily. 90 tablet 3   No current facility-administered medications on file prior to visit.    Allergies:  Allergies  Allergen Reactions  . Ace Inhibitors Cough  . Ether Other (See Comments)    unknown   Past Medical History: No past medical history on file. Past Surgical History:  Past Surgical History:  Procedure Laterality Date  . APPLICATION OF CRANIAL NAVIGATION N/A 01/04/2018   Procedure: APPLICATION OF CRANIAL NAVIGATION;  Surgeon: Erline Levine, MD;  Location: [facility];  Service: Neurosurgery;  Laterality: N/A;  . BRAIN SURGERY    . PR DURAL GRAFT REPAIR,SPINE DEFECT Left 01/04/2018   Procedure: Left Pterional craniotomy for biopsy with Brainlab;  Surgeon: Erline Levine, MD;  Location: [facility];  Service: Neurosurgery;  Laterality: Left;  Left Pterional craniotomy for biopsy with Brainlab   Social History:  Social History   Socioeconomic History  . Marital status: Married    Spouse name: Not on file  . Number of children: Not on file  . Years of education: Not on file  . Highest education level: Not on file  Occupational History  . Not on file  Tobacco Use  .  Smoking status: Former Smoker    Packs/day: 1.00    Types: Cigarettes  . Smokeless tobacco: Never Used  . Tobacco comment: plans to quit now  Vaping Use  . Vaping Use: Never used  Substance and Sexual Activity  . Alcohol use: No  . Drug use: No  . Sexual activity: Yes  Other Topics Concern  . Not on file  Social History Narrative  . Not on file   Social Determinants of Health   Financial Resource Strain: Not on file  Food Insecurity: Not on file  Transportation Needs: Not on file  Physical Activity: Not on  file  Stress: Not on file  Social Connections: Not on file  Intimate Partner Violence: Not on file   Family History:  Family History  Problem Relation Age of Onset  . Colon cancer Mother   . CAD Father   . Cancer Father   . Diabetes Father   . Heart disease Father   . Hypertension Father   . Diabetes Sister   . Diabetes Brother     Review of Systems: Constitutional: Denies fevers, chills or abnormal weight loss Eyes: Denies blurriness of vision Ears, nose, mouth, throat, and face: +hair loss Respiratory: Denies cough, dyspnea or wheezes Cardiovascular: Denies palpitation, chest discomfort or lower extremity swelling Gastrointestinal:  Denies nausea, constipation, diarrhea GU: Denies dysuria or incontinence Skin: Denies abnormal skin rashes Neurological: Per HPI Musculoskeletal: Denies joint pain, back or neck discomfort. No decrease in ROM Behavioral/Psych: +anxiety  Physical Exam: Vitals:   04/07/21 0934 04/07/21 0937  BP: 133/79 133/79  Pulse: 71 71  Resp: 18 18  Temp: 97.9 F (36.6 C) 97.9 F (36.6 C)  SpO2: 98% 98%   KPS: 90. General: Alert, cooperative, pleasant, in no acute distress Head: Normal EENT: No conjunctival injection or scleral icterus. Oral mucosa moist Lungs: Resp effort normal Cardiac: Regular rate and rhythm Abdomen: Soft, non-distended abdomen Skin: No rashes cyanosis or petechiae. Extremities: no edema  Neurologic Exam: Mental Status: Awake, alert, attentive to examiner. Oriented to self and environment. Language is fluent with intact comprehension.  Cranial Nerves: Visual acuity is grossly normal. Visual fields are full. Extra-ocular movements intact. No ptosis. Face is symmetric, tongue midline. Motor: Tone and bulk are normal. Power is full in both arms and legs. Reflexes are symmetric, no pathologic reflexes present. Intact finger to nose bilaterally Sensory: Intact to light touch and temperature Gait: Normal and tandem gait is  deferred   Labs: I have reviewed the data as listed    Component Value Date/Time   NA 139 12/10/2020 1019   K 4.8 12/10/2020 1019   CL 101 12/10/2020 1019   CO2 24 12/10/2020 1019   GLUCOSE 94 12/10/2020 1019   GLUCOSE 184 (H) 10/08/2020 0322   BUN 20 12/10/2020 1019   CREATININE 0.52 (L) 12/10/2020 1019   CALCIUM 9.9 12/10/2020 1019   PROT 6.9 12/10/2020 1019   ALBUMIN 4.5 12/10/2020 1019   AST 16 12/10/2020 1019   ALT 18 12/10/2020 1019   ALKPHOS 161 (H) 12/10/2020 1019   BILITOT 0.4 12/10/2020 1019   GFRNONAA 98 12/10/2020 1019   GFRNONAA >60 10/08/2020 0322   GFRAA 113 12/10/2020 1019   Lab Results  Component Value Date   WBC 6.4 10/08/2020   NEUTROABS 6.3 10/07/2020   HGB 13.5 10/08/2020   HCT 41.0 10/08/2020   MCV 94.9 10/08/2020   PLT 387 10/08/2020    Imaging:  [facility] Clinician Interpretation: I have personally reviewed the  CNS images as listed.  My interpretation, in the context of the patient's clinical presentation, is stable disease  MR BRAIN W WO CONTRAST  Result Date: 04/02/2021 CLINICAL DATA:  Brain mass.  Seizure. EXAM: MRI HEAD WITHOUT AND WITH CONTRAST TECHNIQUE: Multiplanar, multiecho pulse sequences of the brain and surrounding structures were obtained without and with intravenous contrast. CONTRAST:  41mL GADAVIST GADOBUTROL 1 MMOL/ML IV SOLN COMPARISON:  MRI of the brain October 07, 2020. FINDINGS: Brain: No acute infarction, hemorrhage or hydrocephalus. Stable appearance of 8-9 mm dural-based enhancing lesion in the left frontal region with associated increased thickness of the adjacent dura with stable increased T2 signal in the adjacent brain parenchyma. No significant change of the surgical cavity with surrounding T2 hyperintensity in the left temporal lobe without evidence of abnormal contrast enhancement. 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 Vascular: Normal flow voids. Skull and  upper cervical spine: Postsurgical changes from left frontotemporal craniotomy. Marrow signal characteristics are otherwise maintained. Sinuses/Orbits: Bilateral lens surgery. Paranasal sinuses are clear. IMPRESSION: Stable MRI of the brain with persistent enhancing left frontal dural-based lesion. No new focus of abnormal contrast enhancement identified. Electronically Signed   By: Pedro Earls M.D.   On: 04/02/2021 11:30   MR CERVICAL SPINE W WO CONTRAST  Result Date: 04/02/2021 CLINICAL DATA:  Brain/CNS neoplasm, assess treatment response. Neurosarcoidosis. EXAM: MRI CERVICAL SPINE WITHOUT AND WITH CONTRAST TECHNIQUE: Multiplanar and multiecho pulse sequences of the cervical spine, to include the craniocervical junction and cervicothoracic junction, were obtained without and with intravenous contrast. CONTRAST:  23mL GADAVIST GADOBUTROL 1 MMOL/ML IV SOLN COMPARISON:  MRI of the cervical spine October 08, 2020. FINDINGS: Alignment: Straightening of the cervical curvature with small retrolisthesis of C3 over C4 and C6 over C7 and minimal anterolisthesis of C4 over C5. Vertebrae: No fracture, evidence of discitis, or bone lesion. Endplate degenerative changes at C3-4. Cord: Normal appearing cervical spinal cord with interval resolution of the cord edema, expansion and contrast enhancement. No new focus of abnormal contrast enhancement identified. Posterior Fossa, vertebral arteries, paraspinal tissues: Negative. Disc levels: C2-3: Facet degenerative changes resulting in mild right neural foraminal narrowing. C3-4: Posterior disc osteophyte complex resulting moderate spinal canal stenosis. Uncovertebral and facet degenerative changes resulting moderate bilateral neural foraminal narrowing, right greater than left. C4-5: Posterior disc osteophyte complex resulting moderate spinal canal stenosis. Uncovertebral and facet degenerative changes resulting in moderate right and mild left neural foraminal  narrowing. C5-6: Posterior disc osteophyte complex resulting in mild spinal canal stenosis. Uncovertebral and facet degenerative changes resulting moderate right and severe left neural foraminal narrowing. C6-7: Posterior disc osteophyte complex resulting in moderate spinal canal stenosis. Uncovertebral and facet degenerative changes resulting moderate right and severe left neural foraminal narrowing, left greater than right. C7-T1: Facet degenerative changes. No spinal canal or neural foraminal stenosis. No significant change of the degenerative findings in the cervical spine. IMPRESSION: 1. Interval resolution of the cervical cord edema, expansion and contrast enhancement. No new focus of abnormal contrast enhancement identified. 2. Stable degenerative changes of the cervical spine with moderate spinal canal stenosis at C3-4, C4-5 and C6-7. 3. Multilevel neural foraminal narrowing, more pronounced at C5-6 and C6-7 where there is moderate right and severe left neural foraminal narrowing. Electronically Signed   By: Pedro Earls M.D.   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  Noted dramatic decline in signal abnormality associated with the cervical spine lesion.  Underlying etiology is likely inflammatory rather than neoplastic, and have greatest suspicion for neurosarcoidosis.    She is now following with neuro-immunologist Dr. Manuella Ghazi at [facility].    No further steroids or immunomodulation at this time.  We ask that Tara [name] return to clinic in 6 months following next brain MRI, or sooner as needed.   We appreciate the opportunity to participate in the care of [employer]   All questions were answered. The patient knows to call the clinic with any problems, questions or concerns. No barriers to learning were detected.  The total time spent in the encounter was 30  minutes and more than 50% was on counseling and review of test results   [name], MD Medical Director of Neuro-Oncology [facility] [place] 04/07/21 9:17 AM

## 2021-04-18 ENCOUNTER — Ambulatory Visit (INDEPENDENT_AMBULATORY_CARE_PROVIDER_SITE_OTHER): Payer: Medicare Other | Admitting: Internal Medicine

## 2021-04-18 ENCOUNTER — Other Ambulatory Visit: Payer: Self-pay

## 2021-04-18 ENCOUNTER — Encounter: Payer: Self-pay | Admitting: Internal Medicine

## 2021-04-18 VITALS — BP 124/82 | HR 76 | Temp 98.5°F | Ht 66.0 in | Wt 202.8 lb

## 2021-04-18 DIAGNOSIS — R269 Unspecified abnormalities of gait and mobility: Secondary | ICD-10-CM | POA: Diagnosis not present

## 2021-04-18 DIAGNOSIS — R739 Hyperglycemia, unspecified: Secondary | ICD-10-CM | POA: Diagnosis not present

## 2021-04-18 DIAGNOSIS — R195 Other fecal abnormalities: Secondary | ICD-10-CM | POA: Diagnosis not present

## 2021-04-18 DIAGNOSIS — Z1211 Encounter for screening for malignant neoplasm of colon: Secondary | ICD-10-CM

## 2021-04-18 DIAGNOSIS — I10 Essential (primary) hypertension: Secondary | ICD-10-CM | POA: Diagnosis not present

## 2021-04-18 DIAGNOSIS — I2510 Atherosclerotic heart disease of native coronary artery without angina pectoris: Secondary | ICD-10-CM | POA: Diagnosis not present

## 2021-04-18 DIAGNOSIS — D8689 Sarcoidosis of other sites: Secondary | ICD-10-CM | POA: Diagnosis not present

## 2021-04-18 DIAGNOSIS — I69398 Other sequelae of cerebral infarction: Secondary | ICD-10-CM | POA: Diagnosis not present

## 2021-04-18 DIAGNOSIS — R569 Unspecified convulsions: Secondary | ICD-10-CM | POA: Diagnosis not present

## 2021-04-18 NOTE — Assessment & Plan Note (Signed)
Cologuard done 2019 and appears to have been positive on review. She does not recall being told this and is willing to do colonoscopy. Referral to GI done.

## 2021-04-18 NOTE — Assessment & Plan Note (Signed)
BP at goal on diltiazem and valsartan and hctz. Would continue with tight BP control given past stroke. Recent CMP without indication for change.

## 2021-04-18 NOTE — Patient Instructions (Signed)
We will get you in for a colonoscopy to screen for colon cancer.

## 2021-04-18 NOTE — Assessment & Plan Note (Signed)
Takes keppra BID and no recent seizure activity.

## 2021-04-18 NOTE — Assessment & Plan Note (Signed)
Most recent HgA1c 5.11 Dec 2020 and have recommended yearly monitoring. She understands that potential need for more steroid courses in the future could make her higher risk for progression to diabetes.

## 2021-04-18 NOTE — Assessment & Plan Note (Signed)
Some change to the right leg which is not causing problems at this time but she does have to be considerate of this.

## 2021-04-18 NOTE — Assessment & Plan Note (Signed)
Possible diagnosis. Currently doing well and getting MRI every 6 months.

## 2021-04-18 NOTE — Progress Notes (Signed)
   Subjective:   Patient ID: Tara [name], female    DOB: 1951-10-21, 70 y.o.   MRN: [id number]  HPI The patient is a 70 YO female coming in new for ongoing care including blood pressure (taking diltiazem and valsartan and hctz, denies chest pains, denies side effects), and brain lesion (potentially neurosarcoidosis, steroid responsive, found 2019 and biopsied at the time, not definitive, follows with neuro-onc and with [facility]) and pre-diabetes (most recent HgA1c taken right after treatment with steroids, most recent 5.9).   PMH, [facility], social history reviewed and updated  Review of Systems  Constitutional: Negative.   HENT: Negative.    Eyes: Negative.   Respiratory:  Negative for cough, chest tightness and shortness of breath.   Cardiovascular:  Negative for chest pain, palpitations and leg swelling.  Gastrointestinal:  Negative for abdominal distention, abdominal pain, constipation, diarrhea, nausea and vomiting.  Musculoskeletal:  Positive for gait problem.  Skin: Negative.   Psychiatric/Behavioral: Negative.     Objective:  Physical Exam Constitutional:      Appearance: She is well-developed.  HENT:     Head: Normocephalic and atraumatic.  Cardiovascular:     Rate and Rhythm: Normal rate and regular rhythm.  Pulmonary:     Effort: Pulmonary effort is normal. No respiratory distress.     Breath sounds: Normal breath sounds. No wheezing or rales.  Abdominal:     General: Bowel sounds are normal. There is no distension.     Palpations: Abdomen is soft.     Tenderness: There is no abdominal tenderness. There is no rebound.  Musculoskeletal:     Cervical back: Normal range of motion.  Skin:    General: Skin is warm and dry.  Neurological:     Mental Status: She is alert and oriented to person, place, and time. Mental status is at baseline.     Coordination: Coordination normal.    Vitals:   04/18/21 0854  BP: 124/82  Pulse: 76  Temp: 98.5 F (36.9 C)  TempSrc: Oral   SpO2: 97%  Weight: 202 lb 12.8 oz (92 kg)  Height: 5\' 6"  (1.676 m)    This visit occurred during the SARS-CoV-2 public health emergency.  Safety protocols were in place, including screening questions prior to the visit, additional usage of staff PPE, and extensive cleaning of exam room while observing appropriate contact time as indicated for disinfecting solutions.   Assessment & Plan:

## 2021-05-12 ENCOUNTER — Telehealth: Payer: Self-pay | Admitting: Internal Medicine

## 2021-05-12 ENCOUNTER — Encounter: Payer: Self-pay | Admitting: Internal Medicine

## 2021-05-12 NOTE — Telephone Encounter (Signed)
Error

## 2021-05-12 NOTE — Telephone Encounter (Signed)
Patient was seen in the ED while in [place]. She brought her summary for review but she was advised by the ED physician to follow up with a cardiologist. Patient would like to get her PCP's opinion first.   Please advise.

## 2021-05-14 DIAGNOSIS — H5347 Heteronymous bilateral field defects: Secondary | ICD-10-CM | POA: Diagnosis not present

## 2021-05-15 ENCOUNTER — Other Ambulatory Visit: Payer: Self-pay

## 2021-05-15 ENCOUNTER — Ambulatory Visit (INDEPENDENT_AMBULATORY_CARE_PROVIDER_SITE_OTHER): Payer: Medicare Other | Admitting: Internal Medicine

## 2021-05-15 ENCOUNTER — Encounter: Payer: Self-pay | Admitting: Internal Medicine

## 2021-05-15 VITALS — BP 136/72 | HR 86 | Temp 98.4°F | Resp 18 | Ht 66.0 in | Wt 203.0 lb

## 2021-05-15 DIAGNOSIS — I499 Cardiac arrhythmia, unspecified: Secondary | ICD-10-CM | POA: Insufficient documentation

## 2021-05-15 DIAGNOSIS — I10 Essential (primary) hypertension: Secondary | ICD-10-CM

## 2021-05-15 DIAGNOSIS — I2510 Atherosclerotic heart disease of native coronary artery without angina pectoris: Secondary | ICD-10-CM | POA: Diagnosis not present

## 2021-05-15 NOTE — Progress Notes (Signed)
   Subjective:   Patient ID: Tara [name], female    DOB: 09-26-1951, 70 y.o.   MRN: [id number]  HPI The patient is a 70 YO female coming in for follow up hospital in [place]. She was at the [place] after hours tour and it was very hot. She was dripping with sweat and getting very lightheaded. She then almost passed out and then could not walk so they called emergency services. She did not want to go to hospital but they took her. She then was treated overnight. She was given new rx for eliquis 5 mg once a day. She has gotten notes from them and brought them to [place] however they are in french. It appears they diagnosed her with A fib and gave her eliquis. She felt well once she was given some water and felt normal afterwards.Has felt normal since and rest of trip was also normal.   Review of Systems  Constitutional: Negative.   HENT: Negative.    Eyes: Negative.   Respiratory:  Negative for cough, chest tightness and shortness of breath.   Cardiovascular:  Negative for chest pain, palpitations and leg swelling.  Gastrointestinal:  Negative for abdominal distention, abdominal pain, constipation, diarrhea, nausea and vomiting.  Musculoskeletal: Negative.   Skin: Negative.   Neurological: Negative.   Psychiatric/Behavioral: Negative.     Objective:  Physical Exam Constitutional:      Appearance: She is well-developed.  HENT:     Head: Normocephalic and atraumatic.  Cardiovascular:     Rate and Rhythm: Normal rate and regular rhythm.  Pulmonary:     Effort: Pulmonary effort is normal. No respiratory distress.     Breath sounds: Normal breath sounds. No wheezing or rales.  Abdominal:     General: Bowel sounds are normal. There is no distension.     Palpations: Abdomen is soft.     Tenderness: There is no abdominal tenderness. There is no rebound.  Musculoskeletal:     Cervical back: Normal range of motion.  Skin:    General: Skin is warm and dry.  Neurological:     Mental Status:  She is alert and oriented to person, place, and time.     Coordination: Coordination normal.    Vitals:   05/15/21 1033  BP: 136/72  Pulse: 86  Resp: 18  Temp: 98.4 F (36.9 C)  TempSrc: Oral  SpO2: 96%  Weight: 203 lb (92.1 kg)  Height: 5\' 6"  (1.676 m)   EKG: Rate 66, axis normal, interval normal, sinus, no st or t wave changes, no significant change compared to prior 2019  This visit occurred during the SARS-CoV-2 public health emergency.  Safety protocols were in place, including screening questions prior to the visit, additional usage of staff PPE, and extensive cleaning of exam room while observing appropriate contact time as indicated for disinfecting solutions.   Assessment & Plan:

## 2021-05-15 NOTE — Patient Instructions (Addendum)
Your EKG today is normal sinus so we will send you to the heart doctor to see if they can do a 30 day or other monitor to check for A fib.  Stop taking the eliquis.  You can start taking 1/2 pill of the valsartan to see if we can get rid of the dizziness.

## 2021-05-15 NOTE — Assessment & Plan Note (Signed)
Has been having some episodes of dizziness so we will reduce valsartan to 40 mg daily. Keep HCTZ and diltiazem dosing the same.

## 2021-05-15 NOTE — Assessment & Plan Note (Signed)
It is difficult to verify if she had A fib or not in [place]. The paperwork she provided did not have any EKGs with it to verify. Labs provided appear normal from the paperwork. She has been taking eliquis 5 mg daily. EKG done in the office in [place]. She has not had A fib in the past. Last ECHO 2019 did have some atrial enlargement. Refer to cardiology to see if they can do monitoring for A fib so that we could verify if she has this. For now I have asked her to stop eliquis. Continue diltiazem which she is taking for BP.

## 2021-05-26 ENCOUNTER — Telehealth: Payer: Self-pay | Admitting: Internal Medicine

## 2021-05-26 ENCOUNTER — Ambulatory Visit
Admission: EM | Admit: 2021-05-26 | Discharge: 2021-05-26 | Disposition: A | Discharge status: Home / Self Care | Payer: Medicare Other | Attending: Emergency Medicine | Admitting: Emergency Medicine

## 2021-05-26 ENCOUNTER — Other Ambulatory Visit: Payer: Self-pay

## 2021-05-26 ENCOUNTER — Encounter: Payer: Self-pay | Admitting: Emergency Medicine

## 2021-05-26 DIAGNOSIS — R310 Gross hematuria: Secondary | ICD-10-CM | POA: Diagnosis not present

## 2021-05-26 DIAGNOSIS — N390 Urinary tract infection, site not specified: Secondary | ICD-10-CM | POA: Insufficient documentation

## 2021-05-26 LAB — POCT URINALYSIS DIP (MANUAL ENTRY)
Glucose, UA: 250 mg/dL — AB
Nitrite, UA: POSITIVE — AB
Protein Ur, POC: >=300 mg/dL — AB
Spec Grav, UA: 1.015 (ref 1.010–1.025)
Urobilinogen, UA: 4 E.U./dL — AB
pH, UA: 5 (ref 5.0–8.0)

## 2021-05-26 MED ORDER — CEPHALEXIN 500 MG PO CAPS: 500.0000 mg | ORAL_CAPSULE | Freq: Two times a day (BID) | ORAL | 0 refills | Status: AC

## 2021-05-26 NOTE — ED Triage Notes (Signed)
Pt is present today with diarrhea, hematuria, and urinary frequency. Pt states that her sx started Tuesday night

## 2021-05-26 NOTE — Discharge Instructions (Addendum)
Urine culture pending Begin Keflex twice daily x1 week to treat UTI Drink plenty of water Monitor for blood in urine to return to normal Follow-up if not improving or worsening

## 2021-05-26 NOTE — Telephone Encounter (Signed)
Needs to go to urgent care if unable to have apt today.

## 2021-05-26 NOTE — ED Provider Notes (Signed)
[facility]    CSN: [id number] Arrival date & time: 05/26/21  1230      History   Chief Complaint Chief Complaint  Patient presents with   Diarrhea   Urinary Frequency   Hematuria    HPI Tara [name] is a 70 y.o. female history of hypertension, seizures, presenting today for evaluation of hematuria, urinary frequency as well as diarrhea.  Symptoms began approximately 1 week ago.  Initially with nausea and vomiting, but the symptoms subsided.  Noticed the hematuria and urinary frequency over the past 2 days.  She denies any abdominal pain or back pain.  Denies any fever persistent nausea and vomiting.  Reports close contact with viral gastroenteritis.  Denies history of UTIs.  Reports feeling irritated and raw to genital area, but denies any discharge or irritation.  Denies history of stones.  HPI  History reviewed. No pertinent past medical history.  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   Hypoalbuminemia due to protein-calorie malnutrition ([place])    Seizures ([place])    Brain mass 01/06/2018   Headache 12/31/2017   Hypertension 12/31/2017   Hyperglycemia 12/31/2017    Past Surgical History:  Procedure Laterality Date   APPLICATION OF CRANIAL NAVIGATION N/A 01/04/2018   Procedure: APPLICATION OF CRANIAL NAVIGATION;  Surgeon: Erline Levine, MD;  Location: [place];  Service: Neurosurgery;  Laterality: N/A;   BRAIN SURGERY     PR DURAL GRAFT REPAIR,SPINE DEFECT Left 01/04/2018   Procedure: Left Pterional craniotomy for biopsy with Brainlab;  Surgeon: Erline Levine, MD;  Location: [place];  Service: Neurosurgery;  Laterality: Left;  Left Pterional craniotomy for biopsy with Brainlab    OB  History   No obstetric history on file.      Home Medications    Prior to Admission medications   Medication Sig Start Date End Date Taking? Authorizing Provider  cephALEXin (KEFLEX) 500 MG capsule Take 1 capsule (500 mg total) by mouth 2 (two) times daily for 7 days. 05/26/21 06/02/21 Yes Latacha Texeira C, PA-C  aspirin EC 81 MG tablet Take 1 tablet (81 mg total) by mouth daily. 01/21/18   [name], MD  diltiazem (CARTIA XT) 240 MG 24 hr capsule Take 1 capsule (240 mg total) by mouth daily. 04/07/21   [name], MD  fluticasone (FLONASE) 50 MCG/ACT nasal spray Place 1 spray into both nostrils daily. 10/10/20   Mariel Aloe, MD  hydrochlorothiazide (HYDRODIURIL) 25 MG tablet Take 1 tablet (25 mg total) by mouth daily. 12/10/20   Just, Laurita Quint, FNP  levETIRAcetam (KEPPRA) 250 MG tablet TAKE 1 TABLET BY MOUTH TWICE A DAY Patient taking differently: Take 250 mg by mouth 2 (two) times daily. 09/11/20   [name], MD  loratadine (CLARITIN) 10 MG tablet Take 1 tablet (10 mg total) by mouth daily as needed for allergies, rhinitis or itching. 10/09/20   Mariel Aloe, MD  Multiple Vitamin (MULTIVITAMIN WITH MINERALS) TABS tablet Take 1 tablet by mouth daily.    [provider]  Multiple Vitamins-Minerals (PRESERVISION AREDS PO) Take 1 capsule by mouth in the morning and at bedtime.     [provider]  OVER THE COUNTER MEDICATION Take 4 mLs by mouth daily as needed (  cold). Cortisone 20 mg liquid    [provider]  rosuvastatin (CRESTOR) 10 MG tablet Take 1 tablet (10 mg total) by mouth daily. 12/10/20   Just, Laurita Quint, FNP  sodium chloride (OCEAN) 0.65 % SOLN nasal spray Place 1 spray into both nostrils daily as needed for congestion.    [provider]  valsartan (DIOVAN) 80 MG tablet Take 1 tablet (80 mg total) by mouth daily. 12/10/20   Just, Laurita Quint, FNP    Family History Family History  Problem Relation Age of Onset   Colon cancer  Mother    CAD Father    Cancer Father    Diabetes Father    Heart disease Father    Hypertension Father    Diabetes Sister    Diabetes Brother     Social History Social History   Tobacco Use   Smoking status: Former    Packs/day: 1.00    Types: Cigarettes   Smokeless tobacco: Never   Tobacco comments:    plans to quit now  Vaping Use   Vaping Use: Never used  Substance Use Topics   Alcohol use: No   Drug use: No     Allergies   Ace inhibitors and Ether   Review of Systems Review of Systems  Constitutional:  Negative for fever.  Respiratory:  Negative for shortness of breath.   Cardiovascular:  Negative for chest pain.  Gastrointestinal:  Positive for diarrhea. Negative for abdominal pain, nausea and vomiting.  Genitourinary:  Positive for dysuria, frequency and hematuria. Negative for flank pain, genital sores, menstrual problem, vaginal bleeding, vaginal discharge and vaginal pain.  Musculoskeletal:  Negative for back pain.  Skin:  Negative for rash.  Neurological:  Negative for dizziness, light-headedness and headaches.    Physical Exam Triage Vital Signs ED Triage Vitals  Enc Vitals Group     BP 05/26/21 1338 121/67     Pulse Rate 05/26/21 1338 98     Resp 05/26/21 1338 17     Temp 05/26/21 1338 98.5 F (36.9 C)     Temp Source 05/26/21 1338 Oral     SpO2 05/26/21 1338 96 %     Weight --      Height --      Head Circumference --      Peak Flow --      Pain Score 05/26/21 1336 0     Pain Loc --      Pain Edu? --      Excl. in [place]? --    No data found.  Updated Vital Signs BP 121/67   Pulse 98   Temp 98.5 F (36.9 C) (Oral)   Resp 17   SpO2 96%   Visual Acuity Right Eye Distance:   Left Eye Distance:   Bilateral Distance:    Right Eye Near:   Left Eye Near:    Bilateral Near:     Physical Exam Vitals and nursing note reviewed.  Constitutional:      Appearance: She is well-developed.     Comments: No acute distress  HENT:      Head: Normocephalic and atraumatic.     Nose: Nose normal.  Eyes:     Conjunctiva/sclera: Conjunctivae normal.  Cardiovascular:     Rate and Rhythm: Normal rate.  Pulmonary:     Effort: Pulmonary effort is normal. No respiratory distress.  Abdominal:     General: There is no distension.  Musculoskeletal:  General: Normal range of motion.     Cervical back: Neck supple.  Skin:    General: Skin is warm and dry.  Neurological:     Mental Status: She is alert and oriented to person, place, and time.     UC Treatments / Results  Labs (all labs ordered are listed, but only abnormal results are displayed) Labs Reviewed  POCT URINALYSIS DIP (MANUAL ENTRY) - Abnormal; Notable for the following components:      Result Value   Color, UA red (*)    Clarity, UA hazy (*)    Glucose, UA =250 (*)    Bilirubin, UA large (*)    Ketones, POC UA small (15) (*)    Blood, UA large (*)    Protein Ur, POC >=300 (*)    Urobilinogen, UA 4.0 (*)    Nitrite, UA Positive (*)    Leukocytes, UA Large (3+) (*)    All other components within normal limits  URINE CULTURE    EKG   Radiology No results found.  Procedures Procedures (including critical care time)  Medications Ordered in UC Medications - No data to display  Initial Impression / Assessment and Plan / UC Course  I have reviewed the triage vital signs and the nursing notes.  Pertinent labs & imaging results that were available during my care of the patient were reviewed by me and considered in my medical decision making (see chart for details).     UA suggestive of UTI and dehydration, culture pending, treating with Keflex, push fluids and monitor for symptoms to return to normal.  Will alter therapy based off sensitivities if needed.  Discussed strict return precautions. Patient verbalized understanding and is agreeable with plan.  Final Clinical Impressions(s) / UC Diagnoses   Final diagnoses:  Lower urinary tract  infection, acute  Gross hematuria     Discharge Instructions      Urine culture pending Begin Keflex twice daily x1 week to treat UTI Drink plenty of water Monitor for blood in urine to return to normal Follow-up if not improving or worsening     ED Prescriptions     Medication Sig Dispense Auth. Provider   cephALEXin (KEFLEX) 500 MG capsule Take 1 capsule (500 mg total) by mouth 2 (two) times daily for 7 days. 14 capsule Zacchary Pompei, [place] C, PA-C      PDMP not reviewed this encounter.   Janith Lima, [place] 05/26/21 1436

## 2021-05-26 NOTE — Telephone Encounter (Signed)
Patient called stating she is having severe diarrhea, nausea, and blood in urine. She has not been exposed to anyone with COVID. Currently, we have no openings in our office and nearby offices are booked as well.   Please advise.

## 2021-05-27 NOTE — Telephone Encounter (Signed)
LVM letting the patient know she should go to urgent care. Office number was provided. Mychart message will be sent as well.

## 2021-05-28 LAB — URINE CULTURE: Culture: >=100000 — AB

## 2021-06-03 ENCOUNTER — Other Ambulatory Visit: Payer: Medicare Other

## 2021-06-03 ENCOUNTER — Ambulatory Visit: Payer: Medicare Other

## 2021-06-25 ENCOUNTER — Ambulatory Visit (INDEPENDENT_AMBULATORY_CARE_PROVIDER_SITE_OTHER): Payer: Medicare Other | Admitting: Cardiology

## 2021-06-25 ENCOUNTER — Ambulatory Visit (INDEPENDENT_AMBULATORY_CARE_PROVIDER_SITE_OTHER): Payer: Medicare Other

## 2021-06-25 ENCOUNTER — Other Ambulatory Visit: Payer: Self-pay

## 2021-06-25 ENCOUNTER — Encounter: Payer: Self-pay | Admitting: Cardiology

## 2021-06-25 VITALS — BP 112/76 | HR 77 | Ht 66.0 in | Wt 193.0 lb

## 2021-06-25 DIAGNOSIS — I10 Essential (primary) hypertension: Secondary | ICD-10-CM

## 2021-06-25 DIAGNOSIS — I480 Paroxysmal atrial fibrillation: Secondary | ICD-10-CM

## 2021-06-25 DIAGNOSIS — D8689 Sarcoidosis of other sites: Secondary | ICD-10-CM | POA: Diagnosis not present

## 2021-06-25 DIAGNOSIS — G9389 Other specified disorders of brain: Secondary | ICD-10-CM | POA: Diagnosis not present

## 2021-06-25 NOTE — Progress Notes (Signed)
Cardiology Consultation:    Date:  06/25/2021   ID:  [name], DOB Jul 18, 1951, MRN [id number]  PCP:  Hoyt Koch, MD  Cardiologist:  Jenne Campus, MD   Referring MD: Hoyt Koch, *   Chief Complaint  Patient presents with   Dizziness    History of Present Illness:    Tara [name] is a 70 y.o. female who is being seen today for the evaluation of atrial fibrillation at the request of Hoyt Koch, *.  She is a sweet lady who likes to travel a lot.  Recently she ended up going to [place] she was visiting there it was at the end of the day it was a hot day she started feeling sweaty swimmy headed and eventually laid down on the floor.  They called ambulance ambulance came and took her to the emergency room.  She was found to be in atrial fibrillation she was rehydrated the diagnosis was atrial fibrillation with CHADS2 Vascor equals 3 and she was initiated with anticoagulation form of Eliquis.  However when she went to see her primary care physician Eliquis has been withdrawn.  She comes today to my office to talk about this issue.  She denies having a palpitation she never had episode of atrial fibrillation.  I did review multiple EKGs in the computer none of them showing atrial fibrillation.  Interestingly in 2019 she did have echocardiogram done which revealed severely enlarged left atrium.  She tells me that she snores some she says as a child she got her tonsils removed and after that her tonsil grew out much bigger.  She never been tested for sleep apnea.  She does not exercise on the regular basis but she is trying to be active.  Denies have any chest pain tightness squeezing pressure burning chest there is no shortness of breath there is no swelling of lower extremities. Still is quite complicated.  She does have some brain lesion which is being followed by neuro surgery in [facility] as well as by neurology at [facility].  It looks like she may be  having neurosarcoidosis that being treated with intermittent courses of steroids.  She did have biopsy done of the brain however it was unrevealing.  Past Medical History:  Diagnosis Date   Sarcoidosis     Past Surgical History:  Procedure Laterality Date   APPLICATION OF CRANIAL NAVIGATION N/A 01/04/2018   Procedure: APPLICATION OF CRANIAL NAVIGATION;  Surgeon: Erline Levine, MD;  Location: [place];  Service: Neurosurgery;  Laterality: N/A;   BRAIN SURGERY     PR DURAL GRAFT REPAIR,SPINE DEFECT Left 01/04/2018   Procedure: Left Pterional craniotomy for biopsy with Brainlab;  Surgeon: Erline Levine, MD;  Location: [place];  Service: Neurosurgery;  Laterality: Left;  Left Pterional craniotomy for biopsy with Brainlab    Current Medications: Current Meds  Medication Sig   valsartan (DIOVAN) 80 MG tablet Take 1 tablet (80 mg total) by mouth daily. (Patient taking differently: Take 40 mg by mouth daily.)     Allergies:   Ace inhibitors and Ether   Social History   Socioeconomic History   Marital status: Married    Spouse name: Not on file   Number of children: Not on file   Years of education: Not on file   Highest education level: Not on file  Occupational History   Not on file  Tobacco Use   Smoking status: Former    Packs/day: 1.00  Types: Cigarettes   Smokeless tobacco: Never   Tobacco comments:    plans to quit now  Vaping Use   Vaping Use: Never used  Substance and Sexual Activity   Alcohol use: No   Drug use: No   Sexual activity: Yes  Other Topics Concern   Not on file  Social History Narrative   Not on file   Social Determinants of Health   Financial Resource Strain: Not on file  Food Insecurity: Not on file  Transportation Needs: Not on file  Physical Activity: Not on file  Stress: Not on file  Social Connections: Not on file     Family History: The patient's family history includes CAD in her father; Cancer in her father; Colon cancer in her mother;  Diabetes in her brother, father, and sister; Heart disease in her father; Hypertension in her father. ROS:   Please see the history of present illness.    All 14 point review of systems negative except as described per history of present illness.  EKGs/Labs/Other Studies Reviewed:    The following studies were reviewed today:   EKG:  EKG is  ordered today.  The ekg ordered today demonstrates normal sinus rhythm, normal P interval, normal QS complex duration morphology  Recent Labs: 10/08/2020: Hemoglobin 13.5; Platelets 387 12/10/2020: ALT 18; BUN 20; Creatinine, Ser 0.52; Potassium 4.8; Sodium 139  Recent Lipid Panel    Component Value Date/Time   CHOL 226 (H) 12/10/2020 1019   TRIG 98 12/10/2020 1019   HDL 62 12/10/2020 1019   CHOLHDL 3.6 12/10/2020 1019   LDLCALC 147 (H) 12/10/2020 1019    Physical Exam:    VS:  BP 112/76 (BP Location: Right Arm, Patient Position: Sitting)   Pulse 77   Ht '5\' 6"'$  (1.676 m)   Wt 193 lb (87.5 kg)   SpO2 96%   BMI 31.15 kg/m     Wt Readings from Last 3 Encounters:  06/25/21 193 lb (87.5 kg)  05/15/21 203 lb (92.1 kg)  04/18/21 202 lb 12.8 oz (92 kg)     GEN:  Well nourished, well developed in no acute distress HEENT: Normal NECK: No JVD; No carotid bruits LYMPHATICS: No lymphadenopathy CARDIAC: RRR, no murmurs, no rubs, no gallops RESPIRATORY:  Clear to auscultation without rales, wheezing or rhonchi  ABDOMEN: Soft, non-tender, non-distended MUSCULOSKELETAL:  No edema; No deformity  SKIN: Warm and dry NEUROLOGIC:  Alert and oriented x 3 PSYCHIATRIC:  Normal affect   ASSESSMENT:    1. PAF (paroxysmal atrial fibrillation) (HCC)   2. Paroxysmal atrial fibrillation ([place])   3. Primary hypertension   4. Brain mass   5. Neurosarcoidosis    PLAN:    In order of problems listed above:  Paroxysmal atrial fibrillation so far 1 documented episodes of atrial fibrillation when she was in [place].  I do not see documentation of the EKG I  did review record from hospital in [place] stating that she had atrial fibrillation but no EKG attached.  Interestingly echocardiogram in 2019 showed severely enlarged left atrium which we will firstly make her more likely of atrial fibrillation.  She was put anticoagulation however it was withdrawn secondary to the fact that she does have neurosarcoidosis.  And understand concern regarding that issue.  The plan will be as follows I will ask her to have Zio patch for a week to see if she have any recurrence of atrial fibrillation, echocardiogram will be repeated.  I will send a message to  her neurologist with a question if we can anticoagulate her.  If we cannot watchman device need to be considered. Essential hypertension blood pressure seems to be well controlled continue present management. Brain mass which appears to be neurosarcoidosis.  Followed by neurology at [facility] as well as at [facility].  It was interesting visit I had a chance to review record from [facility] for this visit   Medication Adjustments/Labs and Tests Ordered: Current medicines are reviewed at length with the patient today.  Concerns regarding medicines are outlined above.  Orders Placed This Encounter  Procedures   LONG TERM MONITOR (3-14 DAYS)   EKG 12-Lead   ECHOCARDIOGRAM COMPLETE   No orders of the defined types were placed in this encounter.   Signed, Park Liter, MD, [facility]. 06/25/2021 10:54 AM    [place]

## 2021-06-25 NOTE — Patient Instructions (Signed)
Medication Instructions:  Your physician recommends that you continue on your current medications as directed. Please refer to the Current Medication list given to you today.  *If you need a refill on your cardiac medications before your next appointment, please call your pharmacy*   Lab Work: None If you have labs (blood work) drawn today and your tests are completely normal, you will receive your results only by: [place] (if you have MyChart) OR A paper copy in the mail If you have any lab test that is abnormal or we need to change your treatment, we will call you to review the results.   Testing/Procedures: Your physician has requested that you have an echocardiogram. Echocardiography is a painless test that uses sound waves to create images of your heart. It provides your doctor with information about the size and shape of your heart and how well your heart's chambers and valves are working. This procedure takes approximately one hour. There are no restrictions for this procedure.  A zio monitor was ordered today. It will remain on for 7 days. You will then return monitor and event diary in provided box. It takes 1-2 weeks for report to be downloaded and returned to [place]. We will call you with the results. If monitor falls off or has orange flashing light, please call Zio for further instructions.   ZIO  WHY IS MY DOCTOR PRESCRIBING ZIO? The Zio system is proven and trusted by physicians to detect and diagnose irregular heart rhythms -- and has been prescribed to hundreds of thousands of patients.  The FDA has cleared the Zio system to monitor for many different kinds of irregular heart rhythms. In a study, physicians were able to reach a diagnosis 90% of the time with the Zio system1.  You can wear the Zio monitor -- a small, discreet, comfortable patch -- during your normal day-to-day activity, including while you sleep, shower, and exercise, while it records every single  heartbeat for analysis.  1Barrett, P., et al. Comparison of 24 Hour Holter Monitoring Versus 14 Day Novel Adhesive Patch Electrocardiographic Monitoring. [place], 2014.  ZIO VS. HOLTER MONITORING The Zio monitor can be comfortably worn for up to 14 days. Holter monitors can be worn for 24 to 48 hours, limiting the time to record any irregular heart rhythms you may have. Zio is able to capture data for the 51% of patients who have their first symptom-triggered arrhythmia after 48 hours.1  LIVE WITHOUT RESTRICTIONS The Zio ambulatory cardiac monitor is a small, unobtrusive, and water-resistant patch--you might even forget you're wearing it. The Zio monitor records and stores every beat of your heart, whether you're sleeping, working out, or showering. Remove on: August 31st 2022   Follow-Up: At [facility], you and your health needs are our priority.  As part of our continuing mission to provide you with exceptional heart care, we have created designated Provider Care Teams.  These Care Teams include your primary Cardiologist (physician) and Advanced Practice Providers (APPs -  Physician Assistants and Nurse Practitioners) who all work together to provide you with the care you need, when you need it.  We recommend signing up for the patient portal called "MyChart".  Sign up information is provided on this After Visit Summary.  MyChart is used to connect with patients for Virtual Visits (Telemedicine).  Patients are able to view lab/test results, encounter notes, upcoming appointments, etc.  Non-urgent messages can be sent to your provider as well.   To  learn more about what you can do with MyChart, go to [email or web address].    Your next appointment:   6 week(s)  The format for your next appointment:   In Person  Provider:   Jenne Campus, MD   Other Instructions

## 2021-07-01 DIAGNOSIS — I471 Supraventricular tachycardia: Secondary | ICD-10-CM | POA: Diagnosis not present

## 2021-07-01 DIAGNOSIS — I480 Paroxysmal atrial fibrillation: Secondary | ICD-10-CM | POA: Diagnosis not present

## 2021-07-03 ENCOUNTER — Telehealth: Payer: Self-pay

## 2021-07-03 DIAGNOSIS — I480 Paroxysmal atrial fibrillation: Secondary | ICD-10-CM | POA: Diagnosis not present

## 2021-07-03 MED ORDER — APIXABAN 5 MG PO TABS
5.0000 mg | ORAL_TABLET | Freq: Two times a day (BID) | ORAL | 12 refills | Status: DC
Start: 2021-07-03 — End: 2021-11-20

## 2021-07-03 NOTE — Telephone Encounter (Signed)
Recommendations reviewed with pt as per Dr. Krasowski's note.  Pt verbalized understanding and had no additional questions.  

## 2021-07-03 NOTE — Addendum Note (Signed)
Addended by: Truddie Hidden on: 07/03/2021 11:12 AM   Modules accepted: Orders

## 2021-07-03 NOTE — Telephone Encounter (Signed)
-----   Message from Park Liter, MD sent at 07/02/2021 10:43 AM EDT ----- Tara [name], please tell the patient that I spoke to [facility] as well as the oncology team and they have no complication for her taking Eliquis.  Please initiate back Eliquis 5 mg twice daily

## 2021-07-09 ENCOUNTER — Telehealth: Payer: Self-pay

## 2021-07-09 MED ORDER — DILTIAZEM HCL ER COATED BEADS 360 MG PO CP24: 360.0000 mg | ORAL_CAPSULE | Freq: Every day | ORAL | 3 refills | Status: DC

## 2021-07-09 NOTE — Telephone Encounter (Signed)
Spoke with patient regarding results and recommendation.  Patient verbalizes understanding and is agreeable to plan of care. Advised patient to call back with any issues or concerns.  

## 2021-07-09 NOTE — Telephone Encounter (Signed)
-----   Message from Park Liter, MD sent at 07/09/2021 12:27 PM EDT ----- Monitor showed 26 episode of supraventricular tachycardia, total burden of supraventricular ectopy 2.7%.  Please increase dose of Cardizem CD from 240 mg daily to 360 mg daily

## 2021-07-15 ENCOUNTER — Other Ambulatory Visit: Payer: Self-pay

## 2021-07-15 ENCOUNTER — Ambulatory Visit (HOSPITAL_COMMUNITY): Payer: Medicare Other | Attending: Cardiology

## 2021-07-15 DIAGNOSIS — I480 Paroxysmal atrial fibrillation: Secondary | ICD-10-CM | POA: Diagnosis not present

## 2021-07-15 LAB — ECHOCARDIOGRAM COMPLETE
Area-P 1/2: 3.61 cm2
S' Lateral: 2.05 cm

## 2021-07-28 ENCOUNTER — Ambulatory Visit (INDEPENDENT_AMBULATORY_CARE_PROVIDER_SITE_OTHER): Payer: Medicare Other

## 2021-07-28 DIAGNOSIS — Z0000 Encounter for general adult medical examination without abnormal findings: Secondary | ICD-10-CM | POA: Diagnosis not present

## 2021-07-28 NOTE — Progress Notes (Signed)
Subjective:  I connected with  Tara [name] on 07/28/21 by an audio only telemedicine application and verified that I am speaking with the correct person using two identifiers.   I discussed the limitations, risks, security and privacy concerns of performing an evaluation and management service by telephone and the availability of in person appointments. I also discussed with the patient that there may be a patient responsible charge related to this service. The patient expressed understanding and verbally consented to this telephonic visit.  Location of Patient: Home  Location of Provider: Office  List any persons and their role that are participating in the visit with the patient. None   Review of Systems    Defer to PCP       Objective:    There were no vitals filed for this visit. There is no height or weight on file to calculate BMI.  Advanced Directives 07/28/2021 10/08/2020 10/07/2020 04/22/2020 12/01/2019 02/14/2018 01/20/2018  Does Patient Have a Medical Advance Directive? Yes No No No Yes No No  Type of Advance Directive - - - - [employer] - -  Does patient want to make changes to medical advance directive? Yes (Inpatient - patient defers changing a medical advance directive and declines information at this time) - - - No - Patient declined - -  Copy of [place] in Chart? - - - - No - copy requested - -  Would patient like information on creating a medical advance directive? - No - Patient declined No - Patient declined No - Patient declined - Yes (MAU/Ambulatory/Procedural Areas - Information given) No - Patient declined    Current Medications (verified) Outpatient Encounter Medications as of 07/28/2021  Medication Sig   apixaban (ELIQUIS) 5 MG TABS tablet Take 1 tablet (5 mg total) by mouth 2 (two) times daily.   aspirin EC 81 MG tablet Take 1 tablet (81 mg total) by mouth daily.   diltiazem (CARDIZEM CD) 360 MG 24 hr capsule Take  1 capsule (360 mg total) by mouth daily.   fluticasone (FLONASE) 50 MCG/ACT nasal spray Place 1 spray into both nostrils daily.   hydrochlorothiazide (HYDRODIURIL) 25 MG tablet Take 1 tablet (25 mg total) by mouth daily.   levETIRAcetam (KEPPRA) 250 MG tablet TAKE 1 TABLET BY MOUTH TWICE A DAY (Patient taking differently: Take 250 mg by mouth 2 (two) times daily.)   loratadine (CLARITIN) 10 MG tablet Take 1 tablet (10 mg total) by mouth daily as needed for allergies, rhinitis or itching.   Multiple Vitamin (MULTIVITAMIN WITH MINERALS) TABS tablet Take 1 tablet by mouth daily.   Multiple Vitamins-Minerals (PRESERVISION AREDS PO) Take 1 capsule by mouth in the morning and at bedtime.    OVER THE COUNTER MEDICATION Take 4 mLs by mouth daily as needed (cold). Cortisone 20 mg liquid   rosuvastatin (CRESTOR) 10 MG tablet Take 1 tablet (10 mg total) by mouth daily.   sodium chloride (OCEAN) 0.65 % SOLN nasal spray Place 1 spray into both nostrils daily as needed for congestion.   valsartan (DIOVAN) 80 MG tablet Take 40 mg by mouth daily. Take 1/2 Tablet daily   [DISCONTINUED] valsartan (DIOVAN) 80 MG tablet Take 1 tablet (80 mg total) by mouth daily. (Patient taking differently: Take 40 mg by mouth daily. Pt taking 1/2 tablet daily)   No facility-administered encounter medications on file as of 07/28/2021.    Allergies (verified) Ace inhibitors and Ether   History: Past Medical History:  Diagnosis Date   Sarcoidosis    Past Surgical History:  Procedure Laterality Date   APPLICATION OF CRANIAL NAVIGATION N/A 01/04/2018   Procedure: APPLICATION OF CRANIAL NAVIGATION;  Surgeon: Erline Levine, MD;  Location: [place];  Service: Neurosurgery;  Laterality: N/A;   BRAIN SURGERY     PR DURAL GRAFT REPAIR,SPINE DEFECT Left 01/04/2018   Procedure: Left Pterional craniotomy for biopsy with Brainlab;  Surgeon: Erline Levine, MD;  Location: [place];  Service: Neurosurgery;  Laterality: Left;  Left Pterional  craniotomy for biopsy with Brainlab   Family History  Problem Relation Age of Onset   Colon cancer Mother    CAD Father    Cancer Father    Diabetes Father    Heart disease Father    Hypertension Father    Diabetes Sister    Diabetes Brother    Social History   Socioeconomic History   Marital status: Married    Spouse name: Not on file   Number of children: Not on file   Years of education: Not on file   Highest education level: Not on file  Occupational History   Not on file  Tobacco Use   Smoking status: Former    Packs/day: 1.00    Types: Cigarettes    Quit date: 12/31/2017    Years since quitting: 3.5   Smokeless tobacco: Never  Vaping Use   Vaping Use: Never used  Substance and Sexual Activity   Alcohol use: No   Drug use: No   Sexual activity: Yes  Other Topics Concern   Not on file  Social History Narrative   Not on file   Social Determinants of Health   Financial Resource Strain: Low Risk    Difficulty of Paying Living Expenses: Not very hard  Food Insecurity: No Food Insecurity   Worried About Charity fundraiser in the Last Year: Never true   [place] in the Last Year: Never true  Transportation Needs: No Transportation Needs   Lack of Transportation (Medical): No   Lack of Transportation (Non-Medical): No  Physical Activity: Sufficiently Active   Days of Exercise per Week: 5 days   Minutes of Exercise per Session: 30 min  Stress: No Stress Concern Present   Feeling of Stress : Only a little  Social Connections: Moderately Isolated   Frequency of Communication with Friends and Family: More than three times a week   Frequency of Social Gatherings with Friends and Family: More than three times a week   Attends Religious Services: Never   Marine scientist or Organizations: No   Attends Music therapist: Never   Marital Status: Married    Tobacco Counseling Counseling given: Not Answered   Clinical Intake:  Pre-visit  preparation completed: Yes  Pain : No/denies pain     Diabetes: No  How often do you need to have someone help you when you read instructions, pamphlets, or other written materials from your doctor or pharmacy?: 1 - Never  Diabetic?no  Interpreter Needed?: No      Activities of Daily Living In your present state of health, do you have any difficulty performing the following activities: 07/28/2021 10/08/2020  Hearing? N N  Vision? N N  Difficulty concentrating or making decisions? Y N  Walking or climbing stairs? Y N  Dressing or bathing? N N  Doing errands, shopping? N N  Preparing Food and eating ? N -  Using the Toilet? N -  In the past six months, have you accidently leaked urine? N -  Do you have problems with loss of bowel control? N -  Managing your Medications? N -  Managing your Finances? N -  Housekeeping or managing your Housekeeping? N -  Some recent data might be hidden    Patient Care Team: Hoyt Koch, MD as PCP - General (Internal Medicine)  Indicate any recent Medical Services you may have received from other than [facility] providers in the past year (date may be approximate).     Assessment:   This is a routine wellness examination for Tara [name].  Hearing/Vision screen No results found.  Dietary issues and exercise activities discussed:     Goals Addressed   None    Depression Screen PHQ 2/9 Scores 07/28/2021 04/18/2021 12/24/2020 10/10/2020 12/05/2019 12/01/2019 01/14/2019  PHQ - 2 Score 1 0 0 0 0 0 0    Fall Risk Fall Risk  07/28/2021 04/18/2021 12/24/2020 10/10/2020 12/05/2019  Falls in the past year? 0 1 0 0 1  Number falls in past yr: 0 0 0 0 0  Comment - - - - -  Injury with Fall? 0 0 0 0 0  Risk for fall due to : - No Fall Risks - No Fall Risks -  Follow up - Falls evaluation completed Falls evaluation completed Falls evaluation completed -    FALL RISK PREVENTION PERTAINING TO THE HOME:  Any stairs in or around the home? Yes  If so,  are there any without handrails? Yes  Home free of loose throw rugs in walkways, pet beds, electrical cords, etc? Yes  Adequate lighting in your home to reduce risk of falls? Yes   ASSISTIVE DEVICES UTILIZED TO PREVENT FALLS:  Life alert? No  Use of a cane, walker or w/c? No  Grab bars in the bathroom? Yes  Shower chair or bench in shower? Yes  Elevated toilet seat or a handicapped toilet? No   TIMED UP AND GO:  Was the test performed?  N/A .  Length of time to ambulate 10 feet: N/A sec.   These questions cannot be answered via   Cognitive Function:     6CIT Screen 07/28/2021 12/01/2019  What Year? 0 points 0 points  What month? - 0 points  What time? 0 points 0 points  Count back from 20 0 points 0 points  Months in reverse 0 points 0 points  Repeat phrase 0 points 0 points  Total Score - 0    Immunizations Immunization History  Administered Date(s) Administered   Fluad Quad(high Dose 65+) 07/31/2019   Influenza, High Dose Seasonal PF 01/04/2018, 08/09/2018   Influenza,inj,Quad PF,6+ Mos 10/10/2020   PFIZER(Purple Top)SARS-COV-2 Vaccination 11/23/2019, 12/14/2019   Pneumococcal Polysaccharide-23 01/04/2018   Tdap 03/14/2018    TDAP status: Up to date  Flu Vaccine status: Due, Education has been provided regarding the importance of this vaccine. Advised may receive this vaccine at local pharmacy or Health Dept. Aware to provide a copy of the vaccination record if obtained from local pharmacy or Health Dept. Verbalized acceptance and understanding.  Pneumococcal vaccine status: Due, Education has been provided regarding the importance of this vaccine. Advised may receive this vaccine at local pharmacy or Health Dept. Aware to provide a copy of the vaccination record if obtained from local pharmacy or Health Dept. Verbalized acceptance and understanding.  Covid-19 vaccine status: Completed vaccines  Qualifies for Shingles Vaccine? Yes   Zostavax completed No    Shingrix  Completed?: No.    Education has been provided regarding the importance of this vaccine. Patient has been advised to call insurance company to determine out of pocket expense if they have not yet received this vaccine. Advised may also receive vaccine at local pharmacy or Health Dept. Verbalized acceptance and understanding.  Screening Tests Health Maintenance  Topic Date Due   MAMMOGRAM  Never done   Zoster Vaccines- Shingrix (1 of 2) Never done   DEXA SCAN  Never done   COVID-19 Vaccine (3 - Booster for Pfizer series) 05/12/2020   INFLUENZA VACCINE  06/02/2021   Fecal DNA (Cologuard)  08/29/2021   TETANUS/TDAP  03/14/2028   Hepatitis C Screening  Completed   HPV VACCINES  Aged Out    Health Maintenance  Health Maintenance Due  Topic Date Due   MAMMOGRAM  Never done   Zoster Vaccines- Shingrix (1 of 2) Never done   DEXA SCAN  Never done   COVID-19 Vaccine (3 - Booster for Pfizer series) 05/12/2020   INFLUENZA VACCINE  06/02/2021    Colorectal cancer screening: Type of screening: Colonoscopy. Completed 2021. Repeat every 2 years Pt unsure of exact year  Mammogram status: Ordered 12/24/20. Pt provided with contact info and advised to call to schedule appt.   Bone Density status: Ordered 12/24/20. Pt provided with contact info and advised to call to schedule appt.  Lung Cancer Screening: (Low Dose CT Chest recommended if Age 12-80 years, 30 pack-year currently smoking OR have quit w/in 15years.) does not qualify.   Lung Cancer Screening Referral: No  Additional Screening:  Hepatitis C Screening: does qualify; Completed 03/2018  Vision Screening: Recommended annual ophthalmology exams for early detection of glaucoma and other disorders of the eye. Is the patient up to date with their annual eye exam?  Yes  Who is the provider or what is the name of the office in which the patient attends annual eye exams? [facility]   If pt is not established with a provider,  would they like to be referred to a provider to establish care? No .   Dental Screening: Recommended annual dental exams for proper oral hygiene  Community Resource Referral / Chronic Care Management: CRR required this visit?  No   CCM required this visit?  No      Plan:     I have personally reviewed and noted the following in the patient's chart:   Medical and social history Use of alcohol, tobacco or illicit drugs  Current medications and supplements including opioid prescriptions.  Functional ability and status Nutritional status Physical activity Advanced directives List of other physicians Hospitalizations, surgeries, and ER visits in previous 12 months Vitals Screenings to include cognitive, depression, and falls Referrals and appointments  In addition, I have reviewed and discussed with patient certain preventive protocols, quality metrics, and best practice recommendations. A written personalized care plan for preventive services as well as general preventive health recommendations were provided to patient.     Lauralyn Primes, RMA   07/28/2021   Nurse Notes: Non Face to Face 45 minute visit encounter  Current Outpatient Medications on File Prior to Visit  Medication Sig Dispense Refill   apixaban (ELIQUIS) 5 MG TABS tablet Take 1 tablet (5 mg total) by mouth 2 (two) times daily. 60 tablet 12   aspirin EC 81 MG tablet Take 1 tablet (81 mg total) by mouth daily.     diltiazem (CARDIZEM CD) 360 MG 24 hr capsule Take 1 capsule (360  mg total) by mouth daily. 90 capsule 3   fluticasone (FLONASE) 50 MCG/ACT nasal spray Place 1 spray into both nostrils daily. 16 g 0   hydrochlorothiazide (HYDRODIURIL) 25 MG tablet Take 1 tablet (25 mg total) by mouth daily. 90 tablet 3   levETIRAcetam (KEPPRA) 250 MG tablet TAKE 1 TABLET BY MOUTH TWICE A DAY (Patient taking differently: Take 250 mg by mouth 2 (two) times daily.) 180 tablet 1   loratadine (CLARITIN) 10 MG tablet Take 1  tablet (10 mg total) by mouth daily as needed for allergies, rhinitis or itching. 30 tablet 0   Multiple Vitamin (MULTIVITAMIN WITH MINERALS) TABS tablet Take 1 tablet by mouth daily.     Multiple Vitamins-Minerals (PRESERVISION AREDS PO) Take 1 capsule by mouth in the morning and at bedtime.      OVER THE COUNTER MEDICATION Take 4 mLs by mouth daily as needed (cold). Cortisone 20 mg liquid     rosuvastatin (CRESTOR) 10 MG tablet Take 1 tablet (10 mg total) by mouth daily. 90 tablet 3   sodium chloride (OCEAN) 0.65 % SOLN nasal spray Place 1 spray into both nostrils daily as needed for congestion.     valsartan (DIOVAN) 80 MG tablet Take 40 mg by mouth daily. Take 1/2 Tablet daily     No current facility-administered medications on file prior to visit.

## 2021-08-19 ENCOUNTER — Other Ambulatory Visit: Payer: Self-pay

## 2021-08-19 ENCOUNTER — Ambulatory Visit (INDEPENDENT_AMBULATORY_CARE_PROVIDER_SITE_OTHER): Payer: Medicare Other | Admitting: Cardiology

## 2021-08-19 ENCOUNTER — Encounter: Payer: Self-pay | Admitting: Cardiology

## 2021-08-19 VITALS — BP 136/84 | HR 71 | Ht 66.0 in | Wt 197.0 lb

## 2021-08-19 DIAGNOSIS — G9389 Other specified disorders of brain: Secondary | ICD-10-CM | POA: Diagnosis not present

## 2021-08-19 DIAGNOSIS — I480 Paroxysmal atrial fibrillation: Secondary | ICD-10-CM

## 2021-08-19 DIAGNOSIS — I10 Essential (primary) hypertension: Secondary | ICD-10-CM | POA: Diagnosis not present

## 2021-08-19 NOTE — Patient Instructions (Signed)
Medication Instructions:  Your physician recommends that you continue on your current medications as directed. Please refer to the Current Medication list given to you today.  *If you need a refill on your cardiac medications before your next appointment, please call your pharmacy*   Lab Work: Non If you have labs (blood work) drawn today and your tests are completely normal, you will receive your results only by: [place] (if you have MyChart) OR A paper copy in the mail If you have any lab test that is abnormal or we need to change your treatment, we will call you to review the results.   Testing/Procedures: None   Follow-Up: At [facility], you and your health needs are our priority.  As part of our continuing mission to provide you with exceptional heart care, we have created designated Provider Care Teams.  These Care Teams include your primary Cardiologist (physician) and Advanced Practice Providers (APPs -  Physician Assistants and Nurse Practitioners) who all work together to provide you with the care you need, when you need it.  We recommend signing up for the patient portal called "MyChart".  Sign up information is provided on this After Visit Summary.  MyChart is used to connect with patients for Virtual Visits (Telemedicine).  Patients are able to view lab/test results, encounter notes, upcoming appointments, etc.  Non-urgent messages can be sent to your provider as well.   To learn more about what you can do with MyChart, go to [email or web address].    Your next appointment:   6 month(s)  The format for your next appointment:   In Person  Provider:   Jenne Campus, MD   Other Instructions

## 2021-08-19 NOTE — Progress Notes (Signed)
Cardiology Office Note:    Date:  08/19/2021   ID:  Tara [name], DOB Jul 24, 1951, MRN [id number]  PCP:  Tara Koch, MD  Cardiologist:  Tara Campus, MD    Referring MD: Tara [name], *   Chief Complaint  Patient presents with   Results    Echo    History of Present Illness:    Tara [name] is a 70 y.o. female with past medical history significant for neurosarcoidosis, essential hypertension.  She was referred to [place] because when she was in [place] she was appears to be dehydrated she passed out developed episode of atrial fibrillation.  She was put on Eliquis however Eliquis has been discontinued with concern about her neurosarcoidosis.  I did contacted her neurologist and there is no problem with anticoagulation for her condition.  Therefore she is being anticoagulated she seems to be doing well.  She denies have any chest pain tightness squeezing pressure burning chest no palpitations no dizziness.  Echocardiogram has been performed which shows significantly enlarged left atrium that was previously reported as well.  Past Medical History:  Diagnosis Date   Sarcoidosis     Past Surgical History:  Procedure Laterality Date   APPLICATION OF CRANIAL NAVIGATION N/A 01/04/2018   Procedure: APPLICATION OF CRANIAL NAVIGATION;  Surgeon: Tara Levine, MD;  Location: [place];  Service: Neurosurgery;  Laterality: N/A;   BRAIN SURGERY     PR DURAL GRAFT REPAIR,SPINE DEFECT Left 01/04/2018   Procedure: Left Pterional craniotomy for biopsy with Brainlab;  Surgeon: Tara Levine, MD;  Location: [place];  Service: Neurosurgery;  Laterality: Left;  Left Pterional craniotomy for biopsy with Brainlab    Current Medications: Current Meds  Medication Sig   apixaban (ELIQUIS) 5 MG TABS tablet Take 1 tablet (5 mg total) by mouth 2 (two) times daily.   aspirin EC 81 MG tablet Take 1 tablet (81 mg total) by mouth daily.   diltiazem (CARDIZEM CD) 360 MG 24 hr capsule Take 1  capsule (360 mg total) by mouth daily.   fluticasone (FLONASE) 50 MCG/ACT nasal spray Place 1 spray into both nostrils daily.   hydrochlorothiazide (HYDRODIURIL) 25 MG tablet Take 1 tablet (25 mg total) by mouth daily.   levETIRAcetam (KEPPRA) 250 MG tablet TAKE 1 TABLET BY MOUTH TWICE A DAY (Patient taking differently: Take 250 mg by mouth 2 (two) times daily.)   loratadine (CLARITIN) 10 MG tablet Take 1 tablet (10 mg total) by mouth daily as needed for allergies, rhinitis or itching.   Multiple Vitamin (MULTIVITAMIN WITH MINERALS) TABS tablet Take 1 tablet by mouth daily. Unknown Strength   Multiple Vitamins-Minerals (PRESERVISION AREDS PO) Take 1 capsule by mouth in the morning and at bedtime. Unknown strenght   OVER THE COUNTER MEDICATION Take 4 mLs by mouth daily as needed (cold). Cortisone 20 mg liquid   rosuvastatin (CRESTOR) 10 MG tablet Take 1 tablet (10 mg total) by mouth daily.   sodium chloride (OCEAN) 0.65 % SOLN nasal spray Place 1 spray into both nostrils daily as needed for congestion.   valsartan (DIOVAN) 80 MG tablet Take 40 mg by mouth daily. Take 1/2 Tablet daily     Allergies:   Ace inhibitors and Ether   Social History   Socioeconomic History   Marital status: Married    Spouse name: Not on file   Number of children: Not on file   Years of education: Not on file   Highest education level: Not on file  Occupational History   Not on file  Tobacco Use   Smoking status: Former    Packs/day: 1.00    Types: Cigarettes    Quit date: 12/31/2017    Years since quitting: 3.6   Smokeless tobacco: Never  Vaping Use   Vaping Use: Never used  Substance and Sexual Activity   Alcohol use: No   Drug use: No   Sexual activity: Yes  Other Topics Concern   Not on file  Social History Narrative   Not on file   Social Determinants of Health   Financial Resource Strain: Low Risk    Difficulty of Paying Living Expenses: Not very hard  Food Insecurity: No Food Insecurity    Worried About Charity fundraiser in the Last Year: Never true   Ran Out of Food in the Last Year: Never true  Transportation Needs: No Transportation Needs   Lack of Transportation (Medical): No   Lack of Transportation (Non-Medical): No  Physical Activity: Sufficiently Active   Days of Exercise per Week: 5 days   Minutes of Exercise per Session: 30 min  Stress: No Stress Concern Present   Feeling of Stress : Only a little  Social Connections: Moderately Isolated   Frequency of Communication with Friends and Family: More than three times a week   Frequency of Social Gatherings with Friends and Family: More than three times a week   Attends Religious Services: Never   Marine scientist or Organizations: No   Attends Music therapist: Never   Marital Status: Married     Family History: The patient's family history includes CAD in her father; Cancer in her father; Colon cancer in her mother; Diabetes in her brother, father, and sister; Heart disease in her father; Hypertension in her father. ROS:   Please see the history of present illness.    All 14 point review of systems negative except as described per history of present illness  EKGs/Labs/Other Studies Reviewed:      Recent Labs: 10/08/2020: Hemoglobin 13.5; Platelets 387 12/10/2020: ALT 18; BUN 20; Creatinine, Ser 0.52; Potassium 4.8; Sodium 139  Recent Lipid Panel    Component Value Date/Time   CHOL 226 (H) 12/10/2020 1019   TRIG 98 12/10/2020 1019   HDL 62 12/10/2020 1019   CHOLHDL 3.6 12/10/2020 1019   LDLCALC 147 (H) 12/10/2020 1019    Physical Exam:    VS:  BP 136/84 (BP Location: Right Arm, Patient Position: Sitting)   Pulse 71   Ht 5\' 6"  (1.676 m)   Wt 197 lb (89.4 kg)   SpO2 98%   BMI 31.80 kg/m     Wt Readings from Last 3 Encounters:  08/19/21 197 lb (89.4 kg)  06/25/21 193 lb (87.5 kg)  05/15/21 203 lb (92.1 kg)     GEN:  Well nourished, well developed in no acute distress HEENT:  Normal NECK: No JVD; No carotid bruits LYMPHATICS: No lymphadenopathy CARDIAC: RRR, no murmurs, no rubs, no gallops RESPIRATORY:  Clear to auscultation without rales, wheezing or rhonchi  ABDOMEN: Soft, non-tender, non-distended MUSCULOSKELETAL:  No edema; No deformity  SKIN: Warm and dry LOWER EXTREMITIES: no swelling NEUROLOGIC:  Alert and oriented x 3 PSYCHIATRIC:  Normal affect   ASSESSMENT:    1. Paroxysmal atrial fibrillation (HCC)   2. Primary hypertension   3. Brain mass    PLAN:    In order of problems listed above:  Paroxysmal atrial fibrillation maintaining sinus rhythm.  Monitor shows  some supraventricular tachycardia but short episodes after like 12 seconds.  We will continue anticoagulation last time I increased dose of calcium channel blocker and she seems to be tolerating this well.  She does have apple watch and ability to record her EKG.  I told her to record EKG at least once a day and let me know if she will have episode of atrial fibrillation.  I think significant enlargement of left atrium indicates the fact that she does have quite frequent episode of atrial fibrillation even though we are not able to pick it up on the monitor.  If she truly have frequent episode of atrial fibrillation I will may consider antiarrhythmic therapy. Essential hypertension: Blood pressure seems to be well controlled continue present management. Dyslipidemia: We initiated Crestor 10 mg.  We will make arrangements for fasting lipid level to be checked   Medication Adjustments/Labs and Tests Ordered: Current medicines are reviewed at length with the patient today.  Concerns regarding medicines are outlined above.  No orders of the defined types were placed in this encounter.  Medication changes: No orders of the defined types were placed in this encounter.   Signed, Park Liter, MD, [facility] 08/19/2021 12:02 PM    [place]

## 2021-08-28 ENCOUNTER — Telehealth: Payer: Self-pay | Admitting: Internal Medicine

## 2021-08-28 ENCOUNTER — Encounter: Payer: Self-pay | Admitting: Nurse Practitioner

## 2021-08-28 NOTE — Telephone Encounter (Signed)
Patient states she was prescribe 40 mg of valsartan (DIOVAN)  but the rx she received is 80 mg  Patient requesting new rx for valsartan (DIOVAN) 40 MG tablet  Pharmacy  [id number] - [place], [place]

## 2021-09-05 DIAGNOSIS — Z23 Encounter for immunization: Secondary | ICD-10-CM | POA: Diagnosis not present

## 2021-09-09 ENCOUNTER — Other Ambulatory Visit: Payer: Self-pay | Admitting: Radiation Therapy

## 2021-09-16 ENCOUNTER — Encounter: Payer: Self-pay | Admitting: Nurse Practitioner

## 2021-09-16 ENCOUNTER — Ambulatory Visit (INDEPENDENT_AMBULATORY_CARE_PROVIDER_SITE_OTHER): Payer: Medicare Other | Admitting: Nurse Practitioner

## 2021-09-16 VITALS — BP 126/80 | HR 64 | Ht 66.0 in | Wt 198.4 lb

## 2021-09-16 DIAGNOSIS — R195 Other fecal abnormalities: Secondary | ICD-10-CM | POA: Diagnosis not present

## 2021-09-16 DIAGNOSIS — Z800 Family history of malignant neoplasm of digestive organs: Secondary | ICD-10-CM

## 2021-09-16 NOTE — Progress Notes (Addendum)
09/16/2021 Tara [name] [id number] July 04, 1951   CHIEF COMPLAINT: Schedule a colonoscopy   HISTORY OF PRESENT ILLNESS:  Tara [name] is a 70 year old female with a past medical history of neurosarcoidosis, atrial fibrillation on Eliquis initially diagnosed 05/2021. She presents to our office today as referred by Dr. Pricilla Holm to schedule a colonoscopy. Her mother and father were both diagnosed with colon cancer in their 76's. She underwent a Cologuard Test 08/23/2018 which was positive. A colonoscopy was not pursued, further details are unknown. She wishes to schedule a colonoscopy at this time. She denies having any upper or lower abdominal pain. She is passing a normal brown bowel movement daily. No rectal bleeding or black stools. She is on Eliquis and ASA due to having paroxysmal atrial fibrillation which was initially diagnosed when traveling in [place] 05/2021. She underwent an extensive neuro evaluation following a hospital admission for right sided weakness, speech difficulty and seizure activity 12/2017. Her initial brain MRI revealed an enhancing lesion with mass affect in the left hemisphere. Differential included ischemic stroke and neoplasm and she was started on oral steroids. Biopsy was performed, but was not diagnostic. 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 Darlin Priestly. She has intermittent dizziness with increased episodes over the past few months. No seizures since 2019. No associated CP or SOB. She is scheduled for a follow up brain MRI 09/30/2021.   CBC Latest Ref Rng & Units 10/08/2020 10/07/2020 05/27/2018  WBC 4.0 - 10.5 K/uL 6.4 8.1 5.8  Hemoglobin 12.0 - 15.0 g/dL 13.5 13.0 12.2  Hematocrit 36.0 - 46.0 % 41.0 40.4 38.0  Platelets 150 - 400 K/uL 387 377 358    CMP Latest Ref Rng & Units 12/10/2020 10/08/2020 10/07/2020  Glucose 65 - 99 mg/dL 94 184(H) 96  BUN 8 - 27 mg/dL 20 25(H) 25(H)   Creatinine 0.57 - 1.00 mg/dL 0.52(L) 0.53 0.59  Sodium 134 - 144 mmol/L 139 135 139  Potassium 3.5 - 5.2 mmol/L 4.8 3.9 3.6  Chloride 96 - 106 mmol/L 101 101 102  CO2 20 - 29 mmol/L '24 23 26  ' Calcium 8.7 - 10.3 mg/dL 9.9 9.3 9.3  Total Protein 6.0 - 8.5 g/dL 6.9 - -  Total Bilirubin 0.0 - 1.2 mg/dL 0.4 - -  Alkaline Phos 44 - 121 IU/L 161(H) - -  AST 0 - 40 IU/L 16 - -  ALT 0 - 32 IU/L 18 - -     MRI brain (10/07/20):  1. New since June extensive abnormal signal in the lower brainstem  and visible spinal cord. Associated new abnormal enhancement,  primarily surface enhancement along the right posterior  cervicomedullary junction.  Burtis Junes this is progression of the underlying chronic CNS inflammatory  process.  Follow-up Cervical and/or Total Spine MRI without and with contrast  would be valuable and could be compared to the 2019 total spine MRI.  2. Elsewhere stable MRI appearance of the brain since June -  actually with some regression of abnormal FLAIR signal in the area  of the chronic left inferior frontal gyrus lesion.   MRI cervical spine (10/08/2020): 1. Extensive T2 hyperintensity/edema extending from the medulla  inferiorly throughout most of the cervical spinal cord with cord  expansion and a small region of plaque-like enhancement along the  cord surface at the cervicomedullary junction. This presumably  reflects involvement by the patient's underlying CNS inflammatory  process, and sarcoidosis could have this appearance  as well as other  causes of transverse myelitis. Neoplasm is considered less likely.  2. No evidence of thoracic or lumbar spine involvement.  3. Multilevel cervical disc and facet degeneration, progressed at  C3-4 where there is moderate spinal and neural foraminal stenosis.  4. Severe lower lumbar facet arthrosis with grade 1 anterolisthesis,  severe spinal stenosis, and moderate to severe neural foraminal  stenosis at L4-5.   ECHO  07/15/2021: IMPRESSIONS Left ventricular ejection fraction, by estimation, is 65 to 70%. Left ventricular ejection fraction by 3D volume is 74 %. The left ventricle has normal function. The left ventricle has no regional wall motion abnormalities. Left ventricular diastolic parameters are consistent with Grade I diastolic dysfunction (impaired relaxation). 1. Right ventricular systolic function is normal. The right ventricular size is normal. There is normal pulmonary artery systolic pressure. The estimated right ventricular systolic pressure is 72.5 mmHg. 2. 3. Left atrial size was severely dilated. The mitral valve is grossly normal. Mild mitral valve regurgitation. No evidence of mitral stenosis. 4. The aortic valve is tricuspid. Aortic valve regurgitation is not visualized. No aortic stenosis is present. 5. The inferior vena cava is normal in size with greater than 50% respiratory variability, suggesting right atrial pressure of 3 mmHg.   Past Medical History:  Diagnosis Date   Sarcoidosis    Past Surgical History:  Procedure Laterality Date   APPLICATION OF CRANIAL NAVIGATION N/A 01/04/2018   Procedure: APPLICATION OF CRANIAL NAVIGATION;  Surgeon: Erline Levine, MD;  Location: [facility];  Service: Neurosurgery;  Laterality: N/A;   BRAIN SURGERY     PR DURAL GRAFT REPAIR,SPINE DEFECT Left 01/04/2018   Procedure: Left Pterional craniotomy for biopsy with Brainlab;  Surgeon: Erline Levine, MD;  Location: [facility];  Service: Neurosurgery;  Laterality: Left;  Left Pterional craniotomy for biopsy with Brainlab  She had difficulty with Ether used during tonsillectomy/ adenoidectomy surgery age 43. No other complications with sedation or anesthesia with her other surgeries. No known difficulty with airway management/intubation with past surgeries .  Social History: She quit smoking cigarettes 3 years ago. No alcohol use. No drug use.   Family History: Mother diagnosed with colon cancer in the  45's.  Father with history of hypertension, heat disease, diabetes and colon cancer diagnosed in his 7's. Brother and sister with diabetes.   Allergies  Allergen Reactions   Ace Inhibitors Cough   Ether Other (See Comments)    unknown     Outpatient Encounter Medications as of 09/16/2021  Medication Sig   apixaban (ELIQUIS) 5 MG TABS tablet Take 1 tablet (5 mg total) by mouth 2 (two) times daily.   aspirin EC 81 MG tablet Take 1 tablet (81 mg total) by mouth daily.   diltiazem (CARDIZEM CD) 360 MG 24 hr capsule Take 1 capsule (360 mg total) by mouth daily.   fluticasone (FLONASE) 50 MCG/ACT nasal spray Place 1 spray into both nostrils daily.   hydrochlorothiazide (HYDRODIURIL) 25 MG tablet Take 1 tablet (25 mg total) by mouth daily.   levETIRAcetam (KEPPRA) 250 MG tablet TAKE 1 TABLET BY MOUTH TWICE A DAY (Patient taking differently: Take 250 mg by mouth 2 (two) times daily.)   loratadine (CLARITIN) 10 MG tablet Take 1 tablet (10 mg total) by mouth daily as needed for allergies, rhinitis or itching.   Multiple Vitamin (MULTIVITAMIN WITH MINERALS) TABS tablet Take 1 tablet by mouth daily. Unknown Strength   Multiple Vitamins-Minerals (PRESERVISION AREDS PO) Take 1 capsule by mouth in the morning  and at bedtime. Unknown strenght   OVER THE COUNTER MEDICATION Take 4 mLs by mouth daily as needed (cold). Cortisone 20 mg liquid   rosuvastatin (CRESTOR) 10 MG tablet Take 1 tablet (10 mg total) by mouth daily.   sodium chloride (OCEAN) 0.65 % SOLN nasal spray Place 1 spray into both nostrils daily as needed for congestion.   valsartan (DIOVAN) 80 MG tablet Take 40 mg by mouth daily. Take 1/2 Tablet daily   No facility-administered encounter medications on file as of 09/16/2021.   REVIEW OF SYSTEMS:  Gen: Denies fever, sweats or chills. No weight loss.  CV: Denies chest pain, palpitations or edema. Resp: Denies cough, shortness of breath of hemoptysis.  GI: See HPI. Denies heartburn,  dysphagia, stomach or lower abdominal pain. No diarrhea or constipation.  GU : Denies urinary burning, blood in urine, increased urinary frequency or incontinence. MS: Denies joint pain, muscles aches or weakness. Derm: Denies rash, itchiness, skin lesions or unhealing ulcers. Psych: Denies depression, anxiety or memory loss.  Heme: Denies bruising, bleeding. Neuro:  + Dizziness. See HPI.  Endo:  Denies any problems with DM, thyroid or adrenal function.  PHYSICAL EXAM: BP 126/80   Pulse 64   Ht '5\' 6"'  (1.676 m)   Wt 198 lb 6 oz (90 kg)   BMI 32.02 kg/m   General: 70 year old female in no acute distress. Head: Normocephalic and atraumatic. Eyes:  Sclerae non-icteric, conjunctive pink. Ears: Normal auditory acuity. Mouth: Dentition intact. No ulcers or lesions.  Neck: Supple, no lymphadenopathy or thyromegaly.  Lungs: Clear bilaterally to auscultation without wheezes, crackles or rhonchi. Heart: Regular rate and rhythm. No murmur, rub or gallop appreciated.  Abdomen: Soft, nontender, non distended. No masses. No hepatosplenomegaly. Normoactive bowel sounds x 4 quadrants.  Rectal: Deferred.  Musculoskeletal: Symmetrical with no gross deformities. Skin: Warm and dry. No rash or lesions on visible extremities. Extremities: No edema. Neurological: Alert oriented x 4, no focal deficits.  Psychological:  Alert and cooperative. Normal mood and affect.  ASSESSMENT AND PLAN:  39) 70 year old female at a high risk for colon cancer secondary to age, a positive Cologuard 2019, mother and father were diagnosed with colon cancer in their 66's. Never had a screening colonoscopy.  -Colonoscopy benefits and risks discussed including risk with sedation, risk of bleeding, perforation and infection  -Patient to call our office to schedule a colonoscopy after neurology clearance received   2) Paroxysmal atrial fibrillation on Eliquis  -Our office will contact cardiologist Dr. Agustin Cree to verify  Eliquis instructions prior to proceeding with a colonoscopy.   3) Neurosarcoidosis, demyelinating disorder with increased episodes of dizziness. Brain MRI scheduled 09/30/2021. -Neurology clearance by Dr. Darlin Priestly required prior to schedule a colonoscopy   4) Elevated alk phos level with normal T. Bil, AS and ALT levels per labs 12/2020.  -Hepatic panel, GGT. I did not review the elevated alk phos level until after the patient was discharged. She was contacted with the instructions to have these labs done.   ADDENDUM:  Neuro clearance requested, Dr. Trena Platt response as follows: "Patient is not on any active neurologic tx and otherwise stable per her 03/12/2021 visit"     ADDENDUM: Follow up brain MRI 09/30/2021 was stable, results as follows: IMPRESSION: 1. Stable 9 mm dural-based lesion the anterior inferior left frontal lobe compatible with a meningioma. 2. Focal enhancement along the anterior margin of the craniotomy. This is stable. It may represent chronic granulation. Intra osseous meningioma not  excluded. 3. No new parenchymal abnormalities. 4. Stable encephalomalacia of the left temporal tip. 5. No residual enhancement left temporal lobe with craniocervical junction to suggest ongoing infectious or inflammatory process. 6. Stable atrophy and white matter disease. This likely reflects the sequela of chronic microvascular ischemia. Post treatment changes likely contribute. 7. No acute intracranial abnormality or significant interval change.  Patient to proceed with a colonoscopy at [facility] 11/20/2021 as scheduled.

## 2021-09-16 NOTE — Progress Notes (Signed)
Attending Physician's Attestation   I have reviewed the chart.   I agree with the Advanced Practitioner's note, impression, and recommendations with any updates as below. Will be a few weeks out for the procedure to be scheduled based on availability so would start working on placing in my scheduled block for December/January as that will give [place] time to get Neurology optimization and follow up of her imaging soon.  Will also need to proceed with Eliquis hold as per PCP or Cardiology request.   Justice Britain, MD [facility] Advanced Endoscopy Office # [phone]

## 2021-09-16 NOTE — Patient Instructions (Addendum)
If you are age 70 or older, your body mass index should be between 23-30. Your Body mass index is 32.02 kg/m. If this is out of the aforementioned range listed, please consider follow up with your Primary Care Provider.  The [facility] providers would like to encourage you to use [facility] to communicate with providers for non-urgent requests or questions.  Due to long hold times on the telephone, sending your provider a message by [facility] may be faster and more efficient way to get a response. Please allow 48 business hours for a response.  Please remember that this is for non-urgent requests/questions.  Neurology clearance will be needed prior to scheduling a colonoscopy with propofol. We will send a request to your Neurologist.  Please contact us once you have been cleared by Neurologist.  It was great seeing you today! Thank you for entrusting me with your care and choosing [facility].  Noralyn Pick, CRNP

## 2021-09-18 NOTE — Progress Notes (Signed)
Patty, since you facilitate Dr. Donneta Romberg scheduled can you find a spot for a colonoscopy Dec or January as we wait for her neuro clearance and contact cardiology to facilitate Eliquis hold instructions.   Refer Dr. Donneta Romberg addendum to my office consult note.   I appreciate you help with this

## 2021-09-19 ENCOUNTER — Telehealth: Payer: Self-pay

## 2021-09-19 NOTE — Telephone Encounter (Addendum)
Request for Neurology clearance has been faxed to Dr. Darlin Priestly. Will set up a reminder to follow up.  Darlin Priestly, [address]   [place], [place] [address]   Phone: [phone]   Fax: [phone]

## 2021-09-19 NOTE — Telephone Encounter (Signed)
Patient with diagnosis of afib on Eliquis for anticoagulation.    Procedure: colonoscopy Date of procedure: TBD  CHA2DS2-VASc Score = 5  This indicates a 7.2% annual risk of stroke. The patient's score is based upon: CHF History: 0 HTN History: 1 Diabetes History: 0 Stroke History: 2 Vascular Disease History: 0 Age Score: 1 Gender Score: 1   CrCl >130mL/min Platelet count 387K  Request is to hold Eliquis for 2-3 days. Per office protocol, patient can hold Eliquis for 1 day prior to procedure given her elevated CV risk. Resume as soon as safely possible after.

## 2021-09-19 NOTE — Telephone Encounter (Signed)
   Primary Cardiologist: Jenne Campus, MD  Chart reviewed as part of pre-operative protocol coverage.  Clinical pharmacist have given the following recommendations for [employer]   Patient with diagnosis of afib on Eliquis for anticoagulation.     Procedure: colonoscopy Date of procedure: TBD   CHA2DS2-VASc Score = 5  This indicates a 7.2% annual risk of stroke. The patient's score is based upon: CHF History: 0 HTN History: 1 Diabetes History: 0 Stroke History: 2 Vascular Disease History: 0 Age Score: 1 Gender Score: 1    CrCl >131mL/min Platelet count 387K   Request is to hold Eliquis for 2-3 days. Per office protocol, patient can hold Eliquis for 1 day prior to procedure given her elevated CV risk. Resume as soon as safely possible after.  I will route this recommendation to the requesting party via Epic fax function and remove from pre-op pool.  Please call with questions.  Jossie Ng. Briggett Tuccillo NP-C    09/19/2021, 12:48 PM [address] Office [phone] Fax [phone]

## 2021-09-19 NOTE — Telephone Encounter (Signed)
Request for surgical clearance:     Endoscopy Procedure  What type of surgery is being performed?     Colonoscopy  When is this surgery scheduled?     TBD  What type of clearance is required ?   Pharmacy  Are there any medications that need to be held prior to surgery and how long? Eliquis 2-3 day hold  Practice name and name of physician performing surgery?      [facility]  What is your office phone and fax number?      Phone- [phone]  [phone]  Anesthesia type (None, local, MAC, general) ?       MAC

## 2021-09-22 ENCOUNTER — Telehealth: Payer: Self-pay

## 2021-09-22 ENCOUNTER — Other Ambulatory Visit: Payer: Self-pay

## 2021-09-22 DIAGNOSIS — Z800 Family history of malignant neoplasm of digestive organs: Secondary | ICD-10-CM

## 2021-09-22 DIAGNOSIS — R195 Other fecal abnormalities: Secondary | ICD-10-CM

## 2021-09-22 MED ORDER — PEG 3350-KCL-NA BICARB-NACL 420 G PO SOLR: 4000.0000 mL | Freq: Once | ORAL | 0 refills | Status: AC

## 2021-09-22 NOTE — Telephone Encounter (Signed)
I have reviewed the chart.    I agree with the Advanced Practitioner's note, impression, and recommendations with any updates as below. Will be a few weeks out for the procedure to be scheduled based on availability so would start working on placing in my scheduled block for December/January as that will give [place] time to get Neurology optimization and follow up of her imaging soon.  Will also need to proceed with Eliquis hold as per PCP or Cardiology request.     Justice Britain, MD [facility] Advanced Endoscopy Office # [phone]

## 2021-09-22 NOTE — Telephone Encounter (Signed)
-----   Message from Noralyn Pick, NP sent at 09/18/2021  2:48 PM EST -----    ----- Message ----- From: Irving Copas., MD Sent: 09/16/2021  12:54 PM EST To: Noralyn Pick, NP     ----- Message ----- From: Noralyn Pick, NP Sent: 09/16/2021  10:45 AM EST To: Irving Copas., MD  Dr.Mansouraty, once neuro clearance received should colonoscopy be scheduled at [facility]?

## 2021-09-22 NOTE — Telephone Encounter (Signed)
Colon has been scheduled for 11/20/21 at 11 am at [facility] with GM.  Left message on machine to call back      Request is to hold Eliquis for 2-3 days. Per office protocol, patient can hold Eliquis for 1 day prior to procedure given her elevated CV risk. Resume as soon as safely possible after.   I will route this recommendation to the requesting party via Epic fax function and remove from pre-op pool.   Please call with questions.   Jossie Ng. Cleaver NP-C

## 2021-09-23 NOTE — Telephone Encounter (Signed)
Colon scheduled, pt instructed and medications reviewed.  Patient instructions mailed to home.  Patient to call with any questions or concerns.  

## 2021-09-23 NOTE — Telephone Encounter (Signed)
Left message on machine to call back  

## 2021-09-25 ENCOUNTER — Other Ambulatory Visit: Payer: Self-pay | Admitting: Internal Medicine

## 2021-09-25 DIAGNOSIS — I10 Essential (primary) hypertension: Secondary | ICD-10-CM

## 2021-09-30 ENCOUNTER — Ambulatory Visit (HOSPITAL_COMMUNITY)
Admission: RE | Admit: 2021-09-30 | Discharge: 2021-09-30 | Disposition: A | Discharge status: Home / Self Care | Payer: Medicare Other | Source: Ambulatory Visit | Attending: Internal Medicine | Admitting: Internal Medicine

## 2021-09-30 ENCOUNTER — Other Ambulatory Visit: Payer: Self-pay

## 2021-09-30 DIAGNOSIS — D8689 Sarcoidosis of other sites: Secondary | ICD-10-CM

## 2021-09-30 DIAGNOSIS — G319 Degenerative disease of nervous system, unspecified: Secondary | ICD-10-CM | POA: Diagnosis not present

## 2021-09-30 DIAGNOSIS — G9389 Other specified disorders of brain: Secondary | ICD-10-CM | POA: Diagnosis not present

## 2021-09-30 MED ORDER — GADOBUTROL 1 MMOL/ML IV SOLN
9.0000 mL | Freq: Once | INTRAVENOUS | Status: AC | PRN
  Administered 2021-09-30: 9 mL via INTRAVENOUS

## 2021-10-06 ENCOUNTER — Inpatient Hospital Stay: Payer: Medicare Other | Attending: Internal Medicine

## 2021-10-06 DIAGNOSIS — Z87891 Personal history of nicotine dependence: Secondary | ICD-10-CM | POA: Insufficient documentation

## 2021-10-06 DIAGNOSIS — G9389 Other specified disorders of brain: Secondary | ICD-10-CM | POA: Insufficient documentation

## 2021-10-07 ENCOUNTER — Other Ambulatory Visit: Payer: Self-pay

## 2021-10-07 ENCOUNTER — Inpatient Hospital Stay (HOSPITAL_BASED_OUTPATIENT_CLINIC_OR_DEPARTMENT_OTHER): Payer: Medicare Other | Admitting: Internal Medicine

## 2021-10-07 VITALS — BP 139/70 | HR 74 | Temp 97.7°F | Resp 18 | Ht 66.0 in | Wt 199.8 lb

## 2021-10-07 DIAGNOSIS — G9389 Other specified disorders of brain: Secondary | ICD-10-CM | POA: Diagnosis not present

## 2021-10-07 DIAGNOSIS — R569 Unspecified convulsions: Secondary | ICD-10-CM

## 2021-10-07 DIAGNOSIS — Z87891 Personal history of nicotine dependence: Secondary | ICD-10-CM | POA: Diagnosis not present

## 2021-10-07 DIAGNOSIS — D8689 Sarcoidosis of other sites: Secondary | ICD-10-CM | POA: Diagnosis not present

## 2021-10-07 NOTE — Progress Notes (Signed)
[facility] [place], [place] [address] [phone]   Interval Evaluation  Date of Service: 10/07/21 Patient Name: Tara [name] Patient MRN: [id number] Patient DOB: 1951/04/11 Provider: [name], MD  Identifying Statement:  LIALA CODISPOTI is a 70 y.o. female with left frontal  brain lesion , transverse myelitis  Interval History:  EVOLET SALMINEN presents today for follow up after recent MRI brain.  No new or progressive deficits. She has no complaints today, remains active and independent.  Continues with baseline mild word finding/expression difficulty.  No recent headaches.   Medications: Current Outpatient Medications on File Prior to Visit  Medication Sig Dispense Refill   apixaban (ELIQUIS) 5 MG TABS tablet Take 1 tablet (5 mg total) by mouth 2 (two) times daily. 60 tablet 12   aspirin EC 81 MG tablet Take 1 tablet (81 mg total) by mouth daily.     diltiazem (CARDIZEM CD) 240 MG 24 hr capsule TAKE 1 CAPSULE BY MOUTH EVERY DAY 90 capsule 1   diltiazem (CARDIZEM CD) 360 MG 24 hr capsule Take 1 capsule (360 mg total) by mouth daily. 90 capsule 3   fluticasone (FLONASE) 50 MCG/ACT nasal spray Place 1 spray into both nostrils daily. 16 g 0   hydrochlorothiazide (HYDRODIURIL) 25 MG tablet Take 1 tablet (25 mg total) by mouth daily. 90 tablet 3   levETIRAcetam (KEPPRA) 250 MG tablet TAKE 1 TABLET BY MOUTH TWICE A DAY (Patient taking differently: Take 250 mg by mouth 2 (two) times daily.) 180 tablet 1   loratadine (CLARITIN) 10 MG tablet Take 1 tablet (10 mg total) by mouth daily as needed for allergies, rhinitis or itching. 30 tablet 0   Multiple Vitamin (MULTIVITAMIN WITH MINERALS) TABS tablet Take 1 tablet by mouth daily. Unknown Strength     Multiple Vitamins-Minerals (PRESERVISION AREDS PO) Take 1 capsule by mouth in the morning and at bedtime. Unknown strenght     OVER THE COUNTER MEDICATION Take 4 mLs by  mouth daily as needed (cold). Cortisone 20 mg liquid     rosuvastatin (CRESTOR) 10 MG tablet Take 1 tablet (10 mg total) by mouth daily. 90 tablet 3   sodium chloride (OCEAN) 0.65 % SOLN nasal spray Place 1 spray into both nostrils daily as needed for congestion.     valsartan (DIOVAN) 80 MG tablet Take 40 mg by mouth daily. Take 1/2 Tablet daily     No current facility-administered medications on file prior to visit.    Allergies:  Allergies  Allergen Reactions   Ace Inhibitors Cough   Ether Other (See Comments)    unknown   Past Medical History:  Past Medical History:  Diagnosis Date   Sarcoidosis    Past Surgical History:  Past Surgical History:  Procedure Laterality Date   APPLICATION OF CRANIAL NAVIGATION N/A 01/04/2018   Procedure: APPLICATION OF CRANIAL NAVIGATION;  Surgeon: Erline Levine, MD;  Location: [facility];  Service: Neurosurgery;  Laterality: N/A;   BRAIN SURGERY     PR DURAL GRAFT REPAIR,SPINE DEFECT Left 01/04/2018   Procedure: Left Pterional craniotomy for biopsy with Brainlab;  Surgeon: Erline Levine, MD;  Location: [facility];  Service: Neurosurgery;  Laterality: Left;  Left Pterional craniotomy for biopsy with Brainlab   Social History:  Social History   Socioeconomic History   Marital status: Married    Spouse name: Not on file   Number of children: Not on file   Years  of education: Not on file   Highest education level: Not on file  Occupational History   Not on file  Tobacco Use   Smoking status: Former    Packs/day: 1.00    Types: Cigarettes    Quit date: 12/31/2017    Years since quitting: 3.7   Smokeless tobacco: Never  Vaping Use   Vaping Use: Never used  Substance and Sexual Activity   Alcohol use: No   Drug use: No   Sexual activity: Yes  Other Topics Concern   Not on file  Social History Narrative   Not on file   Social Determinants of Health   Financial Resource Strain: Low Risk    Difficulty of Paying Living Expenses: Not very hard   Food Insecurity: No Food Insecurity   Worried About Charity fundraiser in the Last Year: Never true   Ran Out of Food in the Last Year: Never true  Transportation Needs: No Transportation Needs   Lack of Transportation (Medical): No   Lack of Transportation (Non-Medical): No  Physical Activity: Sufficiently Active   Days of Exercise per Week: 5 days   Minutes of Exercise per Session: 30 min  Stress: No Stress Concern Present   Feeling of Stress : Only a little  Social Connections: Moderately Isolated   Frequency of Communication with Friends and Family: More than three times a week   Frequency of Social Gatherings with Friends and Family: More than three times a week   Attends Religious Services: Never   Marine scientist or Organizations: No   Attends Music therapist: Never   Marital Status: Married  Human resources officer Violence: Not At Risk   Fear of Current or Ex-Partner: No   Emotionally Abused: No   Physically Abused: No   Sexually Abused: No   Family History:  Family History  Problem Relation Age of Onset   Colon cancer Mother    CAD Father    Cancer Father    Diabetes Father    Heart disease Father    Hypertension Father    Diabetes Sister    Diabetes Brother     Review of Systems: Constitutional: Denies fevers, chills or abnormal weight loss Eyes: Denies blurriness of vision Ears, nose, mouth, throat, and face: +hair loss Respiratory: Denies cough, dyspnea or wheezes Cardiovascular: Denies palpitation, chest discomfort or lower extremity swelling Gastrointestinal:  Denies nausea, constipation, diarrhea GU: Denies dysuria or incontinence Skin: Denies abnormal skin rashes Neurological: Per HPI Musculoskeletal: Denies joint pain, back or neck discomfort. No decrease in ROM Behavioral/Psych: +anxiety  Physical Exam: Vitals:   10/07/21 1215  BP: 139/70  Pulse: 74  Resp: 18  Temp: 97.7 F (36.5 C)  SpO2: 100%   KPS: 90. General: Alert,  cooperative, pleasant, in no acute distress Head: Normal EENT: No conjunctival injection or scleral icterus. Oral mucosa moist Lungs: Resp effort normal Cardiac: Regular rate and rhythm Abdomen: Soft, non-distended abdomen Skin: No rashes cyanosis or petechiae. Extremities: no edema  Neurologic Exam: Mental Status: Awake, alert, attentive to examiner. Oriented to self and environment. Language is fluent with intact comprehension.  Cranial Nerves: Visual acuity is grossly normal. Visual fields are full. Extra-ocular movements intact. No ptosis. Face is symmetric, tongue midline. Motor: Tone and bulk are normal. Power is full in both arms and legs. Reflexes are symmetric, no pathologic reflexes present. Intact finger to nose bilaterally Sensory: Intact to light touch and temperature Gait: Normal and tandem gait is deferred  Labs: I have reviewed the data as listed    Component Value Date/Time   NA 139 12/10/2020 1019   K 4.8 12/10/2020 1019   CL 101 12/10/2020 1019   CO2 24 12/10/2020 1019   GLUCOSE 94 12/10/2020 1019   GLUCOSE 184 (H) 10/08/2020 0322   BUN 20 12/10/2020 1019   CREATININE 0.52 (L) 12/10/2020 1019   CALCIUM 9.9 12/10/2020 1019   PROT 6.9 12/10/2020 1019   ALBUMIN 4.5 12/10/2020 1019   AST 16 12/10/2020 1019   ALT 18 12/10/2020 1019   ALKPHOS 161 (H) 12/10/2020 1019   BILITOT 0.4 12/10/2020 1019   GFRNONAA 98 12/10/2020 1019   GFRNONAA >60 10/08/2020 0322   GFRAA 113 12/10/2020 1019   Lab Results  Component Value Date   WBC 6.4 10/08/2020   NEUTROABS 6.3 10/07/2020   HGB 13.5 10/08/2020   HCT 41.0 10/08/2020   MCV 94.9 10/08/2020   PLT 387 10/08/2020    Imaging:  [facility] Clinician Interpretation: I have personally reviewed the CNS images as listed.  My interpretation, in the context of the patient's clinical presentation, is stable disease  MR BRAIN W WO CONTRAST  Result Date: 10/01/2021 CLINICAL DATA:  Brain/CNS neoplasm. Neurosarcoidosis. Evaluate  response to treatment. EXAM: MRI HEAD WITHOUT AND WITH CONTRAST TECHNIQUE: Multiplanar, multiecho pulse sequences of the brain and surrounding structures were obtained without and with intravenous contrast. CONTRAST:  64mL GADAVIST GADOBUTROL 1 MMOL/ML IV SOLN COMPARISON:  MR head without and with contrast 04/02/2021 FINDINGS: Brain: Focal dural-based lesion the anterior inferior left frontal lobe stable in size measuring 9 mm. Subtle associated diffusion abnormality again seen. Other focal dural-based lesions are present. Encephalomalacia of the left temporal tip is stable. Previously noted signal change and enhancement involving the brainstem and craniocervical junction are not present. No new parenchymal abnormalities are present. Periventricular white matter changes are stable. Remote lacunar infarct is present remote lacunar infarcts of the cerebellum are stable. White matter changes surrounding the left frontal dural-based lesion noted. The ventricles are of normal size. No significant extraaxial fluid collection is present. Vascular: Flow is present in the major intracranial arteries. Skull and upper cervical spine: Degenerative disc disease noted at C3-4. Craniocervical junction and upper cervical spine otherwise within normal limits. Left frontal craniotomy noted. Enhancement again noted along the anterior margin of the craniotomy. No other osseous enhancement present. Sinuses/Orbits: Bilateral lens replacements are noted. Globes and orbits are otherwise unremarkable. The paranasal sinuses and mastoid air cells are clear. IMPRESSION: 1. Stable 9 mm dural-based lesion the anterior inferior left frontal lobe compatible with a meningioma. 2. Focal enhancement along the anterior margin of the craniotomy. This is stable. It may represent chronic granulation. Intra osseous meningioma not excluded. 3. No new parenchymal abnormalities. 4. Stable encephalomalacia of the left temporal tip. 5. No residual enhancement  left temporal lobe with craniocervical junction to suggest ongoing infectious or inflammatory process. 6. Stable atrophy and white matter disease. This likely reflects the sequela of chronic microvascular ischemia. Post treatment changes likely contribute. 7. No acute intracranial abnormality or significant interval change. Electronically Signed   By: San Morelle M.D.   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 Aronoff is clinically and radiographically stable today.  No new or progressive changes.  Underlying etiology is likely inflammatory rather than neoplastic, and have greatest suspicion for neurosarcoidosis.    She will con't to follow with neuro-immunologist Dr. Manuella Ghazi at [facility].  No further steroids or immunomodulation at this time.  We ask that [name] return to clinic in 6 months following next brain MRI, or sooner as needed.   We appreciate the opportunity to participate in the care of [employer]   All questions were answered. The patient knows to call the clinic with any problems, questions or concerns. No barriers to learning were detected.  The total time spent in the encounter was 30 minutes and more than 50% was on counseling and review of test results   [name], MD Medical Director of Neuro-Oncology [facility] at [place] 10/07/21 12:28 PM

## 2021-10-08 ENCOUNTER — Telehealth: Payer: Self-pay | Admitting: Internal Medicine

## 2021-10-08 NOTE — Telephone Encounter (Signed)
Scheduled per 12/7 los, pt has been called and confirmed

## 2021-10-08 NOTE — Progress Notes (Signed)
The pt has been advised of the Eliquis 1 day hold

## 2021-10-09 ENCOUNTER — Telehealth: Payer: Self-pay | Admitting: *Deleted

## 2021-10-09 NOTE — Telephone Encounter (Signed)
Images for MRI Brain from 09/30/2021 pushed to powershare on 10/09/2021

## 2021-10-10 NOTE — Telephone Encounter (Signed)
Request faxed again.

## 2021-10-10 NOTE — Telephone Encounter (Signed)
See separate phone note where patient has been cleared and notified of holding blood thinner

## 2021-10-21 ENCOUNTER — Other Ambulatory Visit: Payer: Self-pay

## 2021-10-21 ENCOUNTER — Encounter: Payer: Self-pay | Admitting: Internal Medicine

## 2021-10-21 ENCOUNTER — Ambulatory Visit (INDEPENDENT_AMBULATORY_CARE_PROVIDER_SITE_OTHER): Payer: Medicare Other | Admitting: Internal Medicine

## 2021-10-21 VITALS — BP 128/90 | HR 71 | Resp 18 | Ht 66.0 in | Wt 197.4 lb

## 2021-10-21 DIAGNOSIS — I10 Essential (primary) hypertension: Secondary | ICD-10-CM

## 2021-10-21 DIAGNOSIS — E782 Mixed hyperlipidemia: Secondary | ICD-10-CM | POA: Diagnosis not present

## 2021-10-21 DIAGNOSIS — I480 Paroxysmal atrial fibrillation: Secondary | ICD-10-CM | POA: Diagnosis not present

## 2021-10-21 DIAGNOSIS — I2510 Atherosclerotic heart disease of native coronary artery without angina pectoris: Secondary | ICD-10-CM | POA: Diagnosis not present

## 2021-10-21 DIAGNOSIS — R739 Hyperglycemia, unspecified: Secondary | ICD-10-CM

## 2021-10-21 DIAGNOSIS — E785 Hyperlipidemia, unspecified: Secondary | ICD-10-CM | POA: Insufficient documentation

## 2021-10-21 LAB — COMPREHENSIVE METABOLIC PANEL
ALT: 20 U/L (ref 0–35)
AST: 19 U/L (ref 0–37)
Albumin: 4.3 g/dL (ref 3.5–5.2)
Alkaline Phosphatase: 104 U/L (ref 39–117)
BUN: 24 mg/dL — ABNORMAL HIGH (ref 6–23)
CO2: 30 mEq/L (ref 19–32)
Calcium: 9.6 mg/dL (ref 8.4–10.5)
Chloride: 100 mEq/L (ref 96–112)
Creatinine, Ser: 0.74 mg/dL (ref 0.40–1.20)
GFR: 81.93 mL/min (ref >60.00)
Glucose, Bld: 94 mg/dL (ref 70–99)
Potassium: 3.7 mEq/L (ref 3.5–5.1)
Sodium: 137 mEq/L (ref 135–145)
Total Bilirubin: 0.6 mg/dL (ref 0.2–1.2)
Total Protein: 7.3 g/dL (ref 6.0–8.3)

## 2021-10-21 LAB — LIPID PANEL
Cholesterol: 147 mg/dL (ref 0–200)
HDL: 68 mg/dL (ref >39.00)
LDL Cholesterol: 68 mg/dL (ref 0–99)
NonHDL: 78.66
Total CHOL/HDL Ratio: 2
Triglycerides: 51 mg/dL (ref 0.0–149.0)
VLDL: 10.2 mg/dL (ref 0.0–40.0)

## 2021-10-21 LAB — CBC
HCT: 36.7 % (ref 36.0–46.0)
Hemoglobin: 12.3 g/dL (ref 12.0–15.0)
MCHC: 33.4 g/dL (ref 30.0–36.0)
MCV: 93.2 fl (ref 78.0–100.0)
Platelets: 369 10*3/uL (ref 150.0–400.0)
RBC: 3.94 Mil/uL (ref 3.87–5.11)
RDW: 13.2 % (ref 11.5–15.5)
WBC: 5.1 10*3/uL (ref 4.0–10.5)

## 2021-10-21 LAB — HEMOGLOBIN A1C: Hgb A1c MFr Bld: 6.2 % (ref 4.6–6.5)

## 2021-10-21 MED ORDER — VALSARTAN 40 MG PO TABS: 40.0000 mg | ORAL_TABLET | Freq: Every day | ORAL | 3 refills | Status: DC

## 2021-10-21 NOTE — Assessment & Plan Note (Signed)
She has not had symptoms of A fib since last visit. Monitoring did reveal no A fib but enlarged LA on echo. She is taking diltiazem 360 mg daily and eliquis 5 mg BID.

## 2021-10-21 NOTE — Progress Notes (Signed)
° °  Subjective:   Patient ID: Tara [name], female    DOB: 1951-02-03, 70 y.o.   MRN: [id number]  HPI The patient is a 70 YO female coming in for follow up medical conditions. Some confusion with pharmacy and dosing of medications as recent changes to dosing.   Review of Systems  Constitutional: Negative.   HENT: Negative.    Eyes: Negative.   Respiratory:  Negative for cough, chest tightness and shortness of breath.   Cardiovascular:  Negative for chest pain, palpitations and leg swelling.  Gastrointestinal:  Negative for abdominal distention, abdominal pain, constipation, diarrhea, nausea and vomiting.  Musculoskeletal: Negative.   Skin: Negative.   Neurological: Negative.   Psychiatric/Behavioral: Negative.     Objective:  Physical Exam Constitutional:      Appearance: She is well-developed.  HENT:     Head: Normocephalic and atraumatic.  Cardiovascular:     Rate and Rhythm: Normal rate and regular rhythm.  Pulmonary:     Effort: Pulmonary effort is normal. No respiratory distress.     Breath sounds: Normal breath sounds. No wheezing or rales.  Abdominal:     General: Bowel sounds are normal. There is no distension.     Palpations: Abdomen is soft.     Tenderness: There is no abdominal tenderness. There is no rebound.  Musculoskeletal:     Cervical back: Normal range of motion.  Skin:    General: Skin is warm and dry.  Neurological:     Mental Status: She is alert and oriented to person, place, and time.     Coordination: Coordination normal.    Vitals:   10/21/21 0806  BP: 128/90  Pulse: 71  Resp: 18  SpO2: 98%  Weight: 197 lb 6.4 oz (89.5 kg)  Height: 5\' 6"  (1.676 m)    This visit occurred during the SARS-CoV-2 public health emergency.  Safety protocols were in place, including screening questions prior to the visit, additional usage of staff PPE, and extensive cleaning of exam room while observing appropriate contact time as indicated for disinfecting  solutions.   Assessment & Plan:

## 2021-10-21 NOTE — Assessment & Plan Note (Signed)
Taking crestor 10 mg daily and needs recheck. Prior to treatment LDL was 144. Adjust as needed for goal <100.

## 2021-10-21 NOTE — Patient Instructions (Signed)
We have sent in the valsartan 40 mg daily.

## 2021-10-21 NOTE — Assessment & Plan Note (Signed)
Discussed medications for BP and BP is at goal today. Will continue valsartan 40 mg daily (rx updated and asked pharmacy to d/c 80 mg daily dosing), hctz 25 mg daily and diltiazem 360 mg daily. Checking CMP and CBC today.

## 2021-10-21 NOTE — Assessment & Plan Note (Signed)
Checking HgA1c, prior 5.9. Adjust as needed.

## 2021-10-26 ENCOUNTER — Encounter: Payer: Self-pay | Admitting: Internal Medicine

## 2021-10-28 NOTE — Telephone Encounter (Signed)
Received fax back that states patient is in not a patient of theirs. I have given the fax to [facility] to decide how to proceed.

## 2021-10-31 DIAGNOSIS — H353190 Nonexudative age-related macular degeneration, unspecified eye, stage unspecified: Secondary | ICD-10-CM | POA: Diagnosis not present

## 2021-10-31 DIAGNOSIS — H53433 Sector or arcuate defects, bilateral: Secondary | ICD-10-CM | POA: Diagnosis not present

## 2021-10-31 DIAGNOSIS — H353130 Nonexudative age-related macular degeneration, bilateral, stage unspecified: Secondary | ICD-10-CM | POA: Diagnosis not present

## 2021-11-11 ENCOUNTER — Encounter (HOSPITAL_COMMUNITY): Payer: Self-pay | Admitting: Gastroenterology

## 2021-11-14 DIAGNOSIS — H43813 Vitreous degeneration, bilateral: Secondary | ICD-10-CM | POA: Diagnosis not present

## 2021-11-14 DIAGNOSIS — Z961 Presence of intraocular lens: Secondary | ICD-10-CM | POA: Diagnosis not present

## 2021-11-14 DIAGNOSIS — H353133 Nonexudative age-related macular degeneration, bilateral, advanced atrophic without subfoveal involvement: Secondary | ICD-10-CM | POA: Diagnosis not present

## 2021-11-14 DIAGNOSIS — Z7901 Long term (current) use of anticoagulants: Secondary | ICD-10-CM | POA: Diagnosis not present

## 2021-11-19 ENCOUNTER — Other Ambulatory Visit: Payer: Self-pay

## 2021-11-19 DIAGNOSIS — Z800 Family history of malignant neoplasm of digestive organs: Secondary | ICD-10-CM

## 2021-11-19 DIAGNOSIS — R195 Other fecal abnormalities: Secondary | ICD-10-CM

## 2021-11-20 ENCOUNTER — Ambulatory Visit (HOSPITAL_COMMUNITY): Payer: Medicare Other | Admitting: Anesthesiology

## 2021-11-20 ENCOUNTER — Encounter (HOSPITAL_COMMUNITY): Payer: Self-pay | Admitting: Gastroenterology

## 2021-11-20 ENCOUNTER — Encounter (HOSPITAL_COMMUNITY)
Admission: RE | Disposition: A | Discharge status: Home / Self Care | Payer: Self-pay | Source: Home / Self Care | Attending: Gastroenterology

## 2021-11-20 ENCOUNTER — Other Ambulatory Visit: Payer: Self-pay

## 2021-11-20 ENCOUNTER — Ambulatory Visit (HOSPITAL_COMMUNITY)
Admission: RE | Admit: 2021-11-20 | Discharge: 2021-11-20 | Disposition: A | Discharge status: Home / Self Care | Payer: Medicare Other | Attending: Gastroenterology | Admitting: Gastroenterology

## 2021-11-20 DIAGNOSIS — Z1212 Encounter for screening for malignant neoplasm of rectum: Secondary | ICD-10-CM

## 2021-11-20 DIAGNOSIS — I10 Essential (primary) hypertension: Secondary | ICD-10-CM | POA: Diagnosis not present

## 2021-11-20 DIAGNOSIS — K621 Rectal polyp: Secondary | ICD-10-CM

## 2021-11-20 DIAGNOSIS — I480 Paroxysmal atrial fibrillation: Secondary | ICD-10-CM

## 2021-11-20 DIAGNOSIS — K5730 Diverticulosis of large intestine without perforation or abscess without bleeding: Secondary | ICD-10-CM | POA: Diagnosis not present

## 2021-11-20 DIAGNOSIS — I69339 Monoplegia of upper limb following cerebral infarction affecting unspecified side: Secondary | ICD-10-CM | POA: Insufficient documentation

## 2021-11-20 DIAGNOSIS — R195 Other fecal abnormalities: Secondary | ICD-10-CM | POA: Diagnosis not present

## 2021-11-20 DIAGNOSIS — D869 Sarcoidosis, unspecified: Secondary | ICD-10-CM | POA: Diagnosis not present

## 2021-11-20 DIAGNOSIS — D127 Benign neoplasm of rectosigmoid junction: Secondary | ICD-10-CM | POA: Diagnosis not present

## 2021-11-20 DIAGNOSIS — D125 Benign neoplasm of sigmoid colon: Secondary | ICD-10-CM | POA: Insufficient documentation

## 2021-11-20 DIAGNOSIS — K644 Residual hemorrhoidal skin tags: Secondary | ICD-10-CM | POA: Diagnosis not present

## 2021-11-20 DIAGNOSIS — K641 Second degree hemorrhoids: Secondary | ICD-10-CM | POA: Insufficient documentation

## 2021-11-20 DIAGNOSIS — Z800 Family history of malignant neoplasm of digestive organs: Secondary | ICD-10-CM

## 2021-11-20 DIAGNOSIS — Z1211 Encounter for screening for malignant neoplasm of colon: Secondary | ICD-10-CM | POA: Diagnosis not present

## 2021-11-20 DIAGNOSIS — K631 Perforation of intestine (nontraumatic): Secondary | ICD-10-CM | POA: Diagnosis not present

## 2021-11-20 HISTORY — PX: HEMOSTASIS CLIP PLACEMENT: SHX6857

## 2021-11-20 HISTORY — PX: POLYPECTOMY: SHX5525

## 2021-11-20 HISTORY — PX: SCLEROTHERAPY: SHX6841

## 2021-11-20 HISTORY — PX: COLONOSCOPY WITH PROPOFOL: SHX5780

## 2021-11-20 SURGERY — COLONOSCOPY WITH PROPOFOL
Anesthesia: Monitor Anesthesia Care

## 2021-11-20 MED ORDER — SODIUM CHLORIDE 0.9 % IV SOLN: INTRAVENOUS | Status: DC

## 2021-11-20 MED ORDER — LACTATED RINGERS IV SOLN: INTRAVENOUS | Status: DC | PRN

## 2021-11-20 MED ORDER — PROPOFOL 500 MG/50ML IV EMUL
INTRAVENOUS | Status: DC | PRN
  Administered 2021-11-20: 100 ug/kg/min via INTRAVENOUS

## 2021-11-20 MED ORDER — SODIUM CHLORIDE (PF) 0.9 % IJ SOLN
PREFILLED_SYRINGE | INTRAMUSCULAR | Status: DC | PRN
  Administered 2021-11-20: 2 mL
  Administered 2021-11-20: 1.5 mL

## 2021-11-20 MED ORDER — LACTATED RINGERS IV SOLN: Freq: Once | INTRAVENOUS | Status: AC

## 2021-11-20 MED ORDER — EPINEPHRINE 1 MG/10ML IJ SOSY
PREFILLED_SYRINGE | INTRAMUSCULAR | Status: AC
  Filled 2021-11-20: qty 10

## 2021-11-20 MED ORDER — APIXABAN 5 MG PO TABS: 5.0000 mg | ORAL_TABLET | Freq: Two times a day (BID) | ORAL | 12 refills | Status: DC

## 2021-11-20 MED ORDER — PROPOFOL 10 MG/ML IV BOLUS
INTRAVENOUS | Status: DC | PRN
Start: 2021-11-20 — End: 2021-11-20
  Administered 2021-11-20: 60 mg via INTRAVENOUS
  Administered 2021-11-20: 20 mg via INTRAVENOUS

## 2021-11-20 SURGICAL SUPPLY — 22 items

## 2021-11-20 NOTE — Transfer of Care (Signed)
Immediate Anesthesia Transfer of Care Note  Patient: Tara [name]  Procedure(s) Performed: COLONOSCOPY WITH PROPOFOL POLYPECTOMY SCLEROTHERAPY HEMOSTASIS CLIP PLACEMENT  Patient Location: PACU  Anesthesia Type:MAC  Level of Consciousness: drowsy and patient cooperative  Airway & Oxygen Therapy: Patient Spontanous Breathing and Patient connected to face mask oxygen  Post-op Assessment: Report given to RN, Post -op Vital signs reviewed and stable and Patient moving all extremities  Post vital signs: Reviewed and stable  Last Vitals:  Vitals Value Taken Time  BP    Temp    Pulse    Resp    SpO2      Last Pain:  Vitals:   11/20/21 1013  TempSrc: Temporal  PainSc: 0-No pain         Complications: No notable events documented.

## 2021-11-20 NOTE — Anesthesia Procedure Notes (Signed)
Procedure Name: MAC Date/Time: 11/20/2021 11:38 AM Performed by: Leonor Liv, CRNA Pre-anesthesia Checklist: Patient identified, Emergency Drugs available, Suction available, Patient being monitored and Timeout performed Patient Re-evaluated:Patient Re-evaluated prior to induction Oxygen Delivery Method: Simple face mask Placement Confirmation: positive ETCO2 Dental Injury: Teeth and Oropharynx as per pre-operative assessment

## 2021-11-20 NOTE — Anesthesia Preprocedure Evaluation (Signed)
Anesthesia Evaluation  Patient identified by MRN, date of birth, ID band Patient awake    Reviewed: Allergy & Precautions, NPO status , Patient's Chart, lab work & pertinent test results  Airway Mallampati: II  TM Distance: >3 FB Neck ROM: Full    Dental no notable dental hx.    Pulmonary former smoker,  sarcoidosis   Pulmonary exam normal breath sounds clear to auscultation       Cardiovascular hypertension, Normal cardiovascular exam+ dysrhythmias (paroxysmal afib) Atrial Fibrillation  Rhythm:Regular Rate:Normal     Neuro/Psych  Headaches, Seizures -,  CVA (monoplegia of arm), Residual Symptoms negative psych ROS   GI/Hepatic negative GI ROS, Neg liver ROS,   Endo/Other  negative endocrine ROS  Renal/GU negative Renal ROS  negative genitourinary   Musculoskeletal negative musculoskeletal ROS (+)   Abdominal   Peds negative pediatric ROS (+)  Hematology negative hematology ROS (+)   Anesthesia Other Findings   Reproductive/Obstetrics negative OB ROS                             Anesthesia Physical Anesthesia Plan  ASA: 2  Anesthesia Plan: MAC   Post-op Pain Management:    Induction: Intravenous  PONV Risk Score and Plan: 2 and Treatment may vary due to age or medical condition and Ondansetron  Airway Management Planned: Natural Airway  Additional Equipment: None  Intra-op Plan:   Post-operative Plan:   Informed Consent: I have reviewed the patients History and Physical, chart, labs and discussed the procedure including the risks, benefits and alternatives for the proposed anesthesia with the patient or authorized representative who has indicated his/her understanding and acceptance.     Dental advisory given  Plan Discussed with: CRNA, Anesthesiologist and Surgeon  Anesthesia Plan Comments:         Anesthesia Quick Evaluation

## 2021-11-20 NOTE — H&P (Signed)
GASTROENTEROLOGY PROCEDURE H&P NOTE   Primary Care Physician: Hoyt Koch, MD  HPI: Tara [name] is a 71 y.o. female who presents for Colonoscopy for Positive 2019 Cologuard.  Past Medical History:  Diagnosis Date   Sarcoidosis    Past Surgical History:  Procedure Laterality Date   APPLICATION OF CRANIAL NAVIGATION N/A 01/04/2018   Procedure: APPLICATION OF CRANIAL NAVIGATION;  Surgeon: Erline Levine, MD;  Location: [facility];  Service: Neurosurgery;  Laterality: N/A;   BRAIN SURGERY     PR DURAL GRAFT REPAIR,SPINE DEFECT Left 01/04/2018   Procedure: Left Pterional craniotomy for biopsy with Brainlab;  Surgeon: Erline Levine, MD;  Location: [facility];  Service: Neurosurgery;  Laterality: Left;  Left Pterional craniotomy for biopsy with Brainlab   Current Facility-Administered Medications  Medication Dose Route Frequency Provider Last Rate Last Admin   0.9 %  sodium chloride infusion   Intravenous Continuous Mansouraty, Telford Nab., MD       0.9 %  sodium chloride infusion   Intravenous Continuous Mansouraty, Telford Nab., MD        Current Facility-Administered Medications:    0.9 %  sodium chloride infusion, , Intravenous, Continuous, Mansouraty, Telford Nab., MD   0.9 %  sodium chloride infusion, , Intravenous, Continuous, Mansouraty, Telford Nab., MD Allergies  Allergen Reactions   Ace Inhibitors Cough   Ether Other (See Comments)    unknown   Family History  Problem Relation Age of Onset   Colon cancer Mother    CAD Father    Cancer Father    Diabetes Father    Heart disease Father    Hypertension Father    Diabetes Sister    Diabetes Brother    Social History   Socioeconomic History   Marital status: Married    Spouse name: Not on file   Number of children: Not on file   Years of education: Not on file   Highest education level: Not on file  Occupational History   Not on file  Tobacco Use   Smoking status: Former    Packs/day: 1.00    Types:  Cigarettes    Quit date: 12/31/2017    Years since quitting: 3.8   Smokeless tobacco: Never  Vaping Use   Vaping Use: Never used  Substance and Sexual Activity   Alcohol use: No   Drug use: No   Sexual activity: Yes  Other Topics Concern   Not on file  Social History Narrative   Not on file   Social Determinants of Health   Financial Resource Strain: Low Risk    Difficulty of Paying Living Expenses: Not very hard  Food Insecurity: No Food Insecurity   Worried About Running Out of Food in the Last Year: Never true   Ran Out of Food in the Last Year: Never true  Transportation Needs: No Transportation Needs   Lack of Transportation (Medical): No   Lack of Transportation (Non-Medical): No  Physical Activity: Sufficiently Active   Days of Exercise per Week: 5 days   Minutes of Exercise per Session: 30 min  Stress: No Stress Concern Present   Feeling of Stress : Only a little  Social Connections: Moderately Isolated   Frequency of Communication with Friends and Family: More than three times a week   Frequency of Social Gatherings with Friends and Family: More than three times a week   Attends Religious Services: Never   Marine scientist or Organizations: No   Attends Club or  Organization Meetings: Never   Marital Status: Married  Human resources officer Violence: Not At Risk   Fear of Current or Ex-Partner: No   Emotionally Abused: No   Physically Abused: No   Sexually Abused: No    Physical Exam: Today's Vitals   11/20/21 1013  BP: 134/79  Pulse: 88  Resp: 20  Temp: 97.6 F (36.4 C)  TempSrc: Temporal  SpO2: 97%  Weight: 88 kg  Height: 5\' 6"  (1.676 m)  PainSc: 0-No pain   Body mass index is 31.31 kg/m. GEN: NAD EYE: Sclerae anicteric ENT: MMM CV: Non-tachycardic GI: Soft, NT/ND NEURO:  Alert & Oriented x 3  Lab Results: No results for input(s): WBC, HGB, HCT, PLT in the last 72 hours. BMET No results for input(s): NA, K, CL, CO2, GLUCOSE, BUN,  CREATININE, CALCIUM in the last 72 hours. LFT No results for input(s): PROT, ALBUMIN, AST, ALT, ALKPHOS, BILITOT, BILIDIR, IBILI in the last 72 hours. PT/INR No results for input(s): LABPROT, INR in the last 72 hours.   Impression / Plan: This is a 71 y.o.female who presents for Colonoscopy for Positive 2019 Cologuard.  The risks and benefits of endoscopic evaluation/treatment were discussed with the patient and/or family; these include but are not limited to the risk of perforation, infection, bleeding, missed lesions, lack of diagnosis, severe illness requiring hospitalization, as well as anesthesia and sedation related illnesses.  The patient's history has been reviewed, patient examined, no change in status, and deemed stable for procedure.  The patient and/or family is agreeable to proceed.    Justice Britain, MD [facility] Advanced [facility]oscopy Office # [phone]

## 2021-11-20 NOTE — Op Note (Signed)
[facility] Patient Name: Tara [name] Procedure Date : 11/20/2021 MRN: [id number] Attending MD: Justice Britain , MD Date of Birth: 1951/04/05 CSN: [id number] Age: 71 Admit Type: Outpatient Procedure:                Colonoscopy Indications:              Positive Cologuard test in 2019 Providers:                Justice Britain, MD, Doristine Johns, RN, [facility] Technician, Technician Referring MD:             Real Cons. Sharlet Salina MD, MD, Noralyn Pick Medicines:                Monitored Anesthesia Care Complications:            No immediate complications. Estimated Blood Loss:     Estimated blood loss was minimal. Procedure:                Pre-Anesthesia Assessment:                           - Prior to the procedure, a History and Physical                            was performed, and patient medications and                            allergies were reviewed. The patient's tolerance of                            previous anesthesia was also reviewed. The risks                            and benefits of the procedure and the sedation                            options and risks were discussed with the patient.                            All questions were answered, and informed consent                            was obtained. Prior Anticoagulants: The patient has                            taken Eliquis (apixaban), last dose was 2 days                            prior to procedure. ASA Grade Assessment: III - A  patient with severe systemic disease. After                            reviewing the risks and benefits, the patient was                            deemed in satisfactory condition to undergo the                            procedure.                           After obtaining informed consent, the colonoscope                            was passed under direct  vision. Throughout the                            procedure, the patient's blood pressure, pulse, and                            oxygen saturations were monitored continuously. The                            CF-HQ190L ([id number]) Olympus coloscope was                            introduced through the anus and advanced to the 5                            cm into the ileum. The colonoscopy was performed                            without difficulty. The patient tolerated the                            procedure. The quality of the bowel preparation was                            good. The terminal ileum, ileocecal valve,                            appendiceal orifice, and rectum were photographed. Scope In: 11:49:07 AM Scope Out: 12:20:10 PM Scope Withdrawal Time: 0 hours 28 minutes 2 seconds  Total Procedure Duration: 0 hours 31 minutes 3 seconds  Findings:      The digital rectal exam findings include hemorrhoids. Pertinent       negatives include no palpable rectal lesions.      The terminal ileum and ileocecal valve appeared normal.      Two sessile polyps were found in the rectum and sigmoid colon. The       polyps were 3 to 8 mm in size. These polyps were removed with a cold       snare. Resection and retrieval were complete.      Two pedunculated polyps were  found in the recto-sigmoid colon and       sigmoid colon. The polyps were 15 and 24 mm in size. Preparations were       made for mucosal resections. NBI imaging and White-light endoscopy was       done to demarcate the borders of each lesion. A 1:100,000 solution of       epinephrine was injected to raise the lesions (2 mL and 1 mL). Snare       mucosal resection was performed. Resection and retrieval were complete.       To prevent bleeding after mucosal resection, four hemostatic clips were       successfully placed (MR conditional) ((3 on Jobos lesion and 1 on RS       lesion)). There was no bleeding at the end of the procedure.       Multiple small-mouthed diverticula were found in the recto-sigmoid       colon, sigmoid colon and descending colon.      Normal mucosa was found in the entire colon otherwise.      Non-bleeding non-thrombosed external and internal hemorrhoids were found       during retroflexion, during perianal exam and during digital exam. The       hemorrhoids were Grade II (internal hemorrhoids that prolapse but reduce       spontaneously). Impression:               - Hemorrhoids found on digital rectal exam.                           - The examined portion of the ileum was normal.                           - Two 3 to 8 mm polyps in the rectum and in the                            sigmoid colon, removed with a cold snare. Resected                            and retrieved.                           - Two 15 to 24 mm polyps at the recto-sigmoid colon                            and in the sigmoid colon, removed with mucosal                            resection. Resected and retrieved. Clips (MR                            conditional) were placed to decrease risk of                            bleeding post-intervention.                           - Diverticulosis in the recto-sigmoid colon, in the  sigmoid colon and in the descending colon.                           - Normal mucosa in the entire examined colon.                           - Non-bleeding non-thrombosed external and internal                            hemorrhoids. Recommendation:           - The patient will be observed post-procedure,                            until all discharge criteria are met.                           - Discharge patient to home.                           - Patient has a contact number available for                            emergencies. The signs and symptoms of potential                            delayed complications were discussed with the                            patient. Return to  normal activities tomorrow.                            Written discharge instructions were provided to the                            patient.                           - High fiber diet.                           - Use FiberCon 1-2 tablets PO daily.                           - Restart Eliquis on 1/21 PM to decrease risk of                            post-interventional bleeding.                           - Continue present medications.                           - Await pathology results.                           - Repeat colonoscopy in 3 years  for surveillance.                           - The findings and recommendations were discussed                            with the patient.                           - The findings and recommendations were discussed                            with the patient's family. Procedure Code(s):        --- Professional ---                           [phone], Colonoscopy, flexible; with endoscopic                            mucosal resection                           45385, 37, Colonoscopy, flexible; with removal of                            tumor(s), polyp(s), or other lesion(s) by snare                            technique Diagnosis Code(s):        --- Professional ---                           K64.1, Second degree hemorrhoids                           K62.1, Rectal polyp                           K63.5, Polyp of colon                           R19.5, Other fecal abnormalities                           K57.30, Diverticulosis of large intestine without                            perforation or abscess without bleeding CPT copyright 2019 American Medical Association. All rights reserved. The codes documented in this report are preliminary and upon coder review may  be revised to meet current compliance requirements. Justice Britain, MD 11/20/2021 12:39:31 PM Number of Addenda: 0

## 2021-11-20 NOTE — Anesthesia Postprocedure Evaluation (Signed)
Anesthesia Post Note  Patient: Tara [name]  Procedure(s) Performed: COLONOSCOPY WITH PROPOFOL POLYPECTOMY [place]     Patient location during evaluation: PACU Anesthesia Type: MAC Level of consciousness: awake and alert Pain management: pain level controlled Vital Signs Assessment: post-procedure vital signs reviewed and stable Respiratory status: spontaneous breathing, nonlabored ventilation and respiratory function stable Cardiovascular status: stable and blood pressure returned to baseline Anesthetic complications: no   No notable events documented.  Last Vitals:  Vitals:   11/20/21 1243 11/20/21 1258  BP: 123/66 117/73  Pulse: 75 67  Resp: 15 18  Temp:  36.4 C  SpO2: 100% 97%    Last Pain:  Vitals:   11/20/21 1258  TempSrc:   PainSc: 0-No pain                 Audry Pili

## 2021-11-21 ENCOUNTER — Encounter: Payer: Self-pay | Admitting: Gastroenterology

## 2021-11-21 ENCOUNTER — Encounter (HOSPITAL_COMMUNITY): Payer: Self-pay | Admitting: Gastroenterology

## 2021-11-21 LAB — SURGICAL PATHOLOGY

## 2021-11-24 DIAGNOSIS — H1045 Other chronic allergic conjunctivitis: Secondary | ICD-10-CM | POA: Diagnosis not present

## 2021-11-24 DIAGNOSIS — H353132 Nonexudative age-related macular degeneration, bilateral, intermediate dry stage: Secondary | ICD-10-CM | POA: Diagnosis not present

## 2021-11-24 DIAGNOSIS — H02831 Dermatochalasis of right upper eyelid: Secondary | ICD-10-CM | POA: Diagnosis not present

## 2021-11-24 DIAGNOSIS — Z961 Presence of intraocular lens: Secondary | ICD-10-CM | POA: Diagnosis not present

## 2021-11-24 DIAGNOSIS — H5347 Heteronymous bilateral field defects: Secondary | ICD-10-CM | POA: Diagnosis not present

## 2021-11-24 DIAGNOSIS — H26493 Other secondary cataract, bilateral: Secondary | ICD-10-CM | POA: Diagnosis not present

## 2021-12-15 ENCOUNTER — Telehealth: Payer: Self-pay | Admitting: Internal Medicine

## 2021-12-15 DIAGNOSIS — I10 Essential (primary) hypertension: Secondary | ICD-10-CM

## 2021-12-15 NOTE — Telephone Encounter (Signed)
1.Medication Requested: hydrochlorothiazide (HYDRODIURIL) 25 MG tablet  2. Pharmacy (Name, [place]): [facility] [id number] - [place], [place]  Phone:  [phone] Fax:  [phone]   3. On Med List: yes  4. Last Visit with PCP: 12.20.22  5. Next visit date with PCP: 12.27.23   Agent: Please be advised that RX refills may take up to 3 business days. We ask that you follow-up with your pharmacy.

## 2021-12-16 MED ORDER — HYDROCHLOROTHIAZIDE 25 MG PO TABS: 25.0000 mg | ORAL_TABLET | Freq: Every day | ORAL | 3 refills | Status: DC

## 2021-12-16 NOTE — Telephone Encounter (Signed)
Pt states that the Prescription was sent to the wrong pharmacy. Pt wants it to be sent to:  [facility] [id number] [place], [place]         Phone:      [phone] Fax:           [phone]  Pt [place] [id number]

## 2021-12-17 MED ORDER — HYDROCHLOROTHIAZIDE 25 MG PO TABS: 25.0000 mg | ORAL_TABLET | Freq: Every day | ORAL | 3 refills | Status: DC

## 2021-12-17 NOTE — Addendum Note (Signed)
Addended by: Thomes Cake on: 12/17/2021 11:41 AM   Modules accepted: Orders

## 2021-12-17 NOTE — Telephone Encounter (Signed)
Medication has been sent to [facility] as requested.

## 2021-12-23 ENCOUNTER — Encounter: Payer: Self-pay | Admitting: Internal Medicine

## 2021-12-24 DIAGNOSIS — R479 Unspecified speech disturbances: Secondary | ICD-10-CM | POA: Diagnosis not present

## 2021-12-24 DIAGNOSIS — R9089 Other abnormal findings on diagnostic imaging of central nervous system: Secondary | ICD-10-CM | POA: Diagnosis not present

## 2021-12-24 DIAGNOSIS — H538 Other visual disturbances: Secondary | ICD-10-CM | POA: Diagnosis not present

## 2021-12-24 DIAGNOSIS — Z8659 Personal history of other mental and behavioral disorders: Secondary | ICD-10-CM | POA: Diagnosis not present

## 2022-01-13 ENCOUNTER — Telehealth: Payer: Self-pay | Admitting: *Deleted

## 2022-01-13 ENCOUNTER — Other Ambulatory Visit: Payer: Self-pay | Admitting: Internal Medicine

## 2022-01-13 ENCOUNTER — Telehealth: Payer: Self-pay | Admitting: Internal Medicine

## 2022-01-13 DIAGNOSIS — I2510 Atherosclerotic heart disease of native coronary artery without angina pectoris: Secondary | ICD-10-CM

## 2022-01-13 MED ORDER — LEVETIRACETAM 250 MG PO TABS: 250.0000 mg | ORAL_TABLET | Freq: Two times a day (BID) | ORAL | 1 refills | Status: DC

## 2022-01-13 NOTE — Telephone Encounter (Signed)
1.Medication Requested: rosuvastatin (CRESTOR) 10 MG tablet ? ?2. Pharmacy (Name, Street, [id number] - [place], [place] ? ?3. On Med List: Y ? ?4. Last Visit with PCP: 10-28-2021 ? ?5. Next visit date with PCP: 10-21-2022 ? ?Pt requesting a new rx, provider is not prescribing provider ?

## 2022-01-13 NOTE — Telephone Encounter (Signed)
Patient also questioned her follow ups with Dr Manuella Ghazi.  She states that she went for her last follow up and Dr Dorita Fray wasn't there for the appointment and she had drove all the way to [facility].  She states that the information given wasn't very helpful.  She wasn't sure if she could just continue to follow up here.  ? ?Patient called requesting refill of Keppra 250 mg BID to [facility] on [address]. Requesting 180 quantity refill. ? ?Routing refill request to provider. ?

## 2022-01-14 MED ORDER — ROSUVASTATIN CALCIUM 10 MG PO TABS: 10.0000 mg | ORAL_TABLET | Freq: Every day | ORAL | 3 refills | Status: DC

## 2022-01-14 NOTE — Telephone Encounter (Signed)
Pt is requesting refill of Crestor 10 mg, I do not see you listed as the prescriber... ok to fill? ?

## 2022-02-12 DIAGNOSIS — H43813 Vitreous degeneration, bilateral: Secondary | ICD-10-CM | POA: Diagnosis not present

## 2022-02-12 DIAGNOSIS — H353133 Nonexudative age-related macular degeneration, bilateral, advanced atrophic without subfoveal involvement: Secondary | ICD-10-CM | POA: Diagnosis not present

## 2022-02-12 DIAGNOSIS — H3589 Other specified retinal disorders: Secondary | ICD-10-CM | POA: Diagnosis not present

## 2022-02-12 DIAGNOSIS — Z961 Presence of intraocular lens: Secondary | ICD-10-CM | POA: Diagnosis not present

## 2022-02-12 DIAGNOSIS — Z7901 Long term (current) use of anticoagulants: Secondary | ICD-10-CM | POA: Diagnosis not present

## 2022-03-17 ENCOUNTER — Other Ambulatory Visit: Payer: Self-pay | Admitting: Radiation Therapy

## 2022-03-24 ENCOUNTER — Encounter: Payer: Self-pay | Admitting: Cardiology

## 2022-03-24 ENCOUNTER — Ambulatory Visit (INDEPENDENT_AMBULATORY_CARE_PROVIDER_SITE_OTHER): Payer: Medicare Other | Admitting: Cardiology

## 2022-03-24 VITALS — BP 124/78 | HR 69 | Ht 66.0 in | Wt 199.0 lb

## 2022-03-24 DIAGNOSIS — E782 Mixed hyperlipidemia: Secondary | ICD-10-CM

## 2022-03-24 DIAGNOSIS — I10 Essential (primary) hypertension: Secondary | ICD-10-CM

## 2022-03-24 DIAGNOSIS — I480 Paroxysmal atrial fibrillation: Secondary | ICD-10-CM

## 2022-03-24 DIAGNOSIS — D8689 Sarcoidosis of other sites: Secondary | ICD-10-CM

## 2022-03-24 NOTE — Patient Instructions (Signed)

## 2022-03-24 NOTE — Progress Notes (Signed)
Cardiology Office Note:    Date:  03/24/2022   ID:  Tara [name], DOB January 11, 1951, MRN [id number]  PCP:  Hoyt Koch, MD  Cardiologist:  Jenne Campus, MD    Referring MD: Hoyt Koch, *   Chief Complaint  Patient presents with   Follow-up  Doing well  History of Present Illness:    Tara [name] is a 71 y.o. female with past medical history significant for paroxysmal atrial fibrillation, she is anticoagulated, history of CVA, history of neurosarcoidosis, essential hypertension, dyslipidemia.  Comes today to my office for follow-up.  Overall doing very well.  Denies of any chest pain tightness squeezing pressure burning chest no palpitations dizziness swelling of lower extremities.  Past Medical History:  Diagnosis Date   Sarcoidosis     Past Surgical History:  Procedure Laterality Date   APPLICATION OF CRANIAL NAVIGATION N/A 01/04/2018   Procedure: APPLICATION OF CRANIAL NAVIGATION;  Surgeon: Erline Levine, MD;  Location: [place];  Service: Neurosurgery;  Laterality: N/A;   BRAIN SURGERY     COLONOSCOPY WITH PROPOFOL N/A 11/20/2021   Procedure: COLONOSCOPY WITH PROPOFOL;  Surgeon: Rush Landmark Telford Nab., MD;  Location: [place];  Service: Gastroenterology;  Laterality: N/A;   HEMOSTASIS CLIP PLACEMENT  11/20/2021   Procedure: HEMOSTASIS CLIP PLACEMENT;  Surgeon: Irving Copas., MD;  Location: [place];  Service: Gastroenterology;;   POLYPECTOMY  11/20/2021   Procedure: POLYPECTOMY;  Surgeon: Irving Copas., MD;  Location: [place];  Service: Gastroenterology;;   PR DURAL GRAFT REPAIR,SPINE DEFECT Left 01/04/2018   Procedure: Left Pterional craniotomy for biopsy with Brainlab;  Surgeon: Erline Levine, MD;  Location: [place];  Service: Neurosurgery;  Laterality: Left;  Left Pterional craniotomy for biopsy with Brainlab   SCLEROTHERAPY  11/20/2021   Procedure: SCLEROTHERAPY;  Surgeon: Irving Copas., MD;  Location: [facility]  ENDOSCOPY;  Service: Gastroenterology;;    Current Medications: Current Meds  Medication Sig   apixaban (ELIQUIS) 5 MG TABS tablet Take 1 tablet (5 mg total) by mouth 2 (two) times daily.   aspirin EC 81 MG tablet Take 1 tablet (81 mg total) by mouth daily.   diltiazem (CARDIZEM CD) 360 MG 24 hr capsule Take 1 capsule (360 mg total) by mouth daily.   fluticasone (FLONASE) 50 MCG/ACT nasal spray Place 1 spray into both nostrils daily. (Patient taking differently: Place 1 spray into both nostrils daily as needed for allergies.)   hydrochlorothiazide (HYDRODIURIL) 25 MG tablet Take 1 tablet (25 mg total) by mouth daily.   levETIRAcetam (KEPPRA) 250 MG tablet Take 1 tablet (250 mg total) by mouth 2 (two) times daily.   loratadine (CLARITIN) 10 MG tablet Take 1 tablet (10 mg total) by mouth daily as needed for allergies, rhinitis or itching.   Multiple Vitamin (MULTIVITAMIN WITH MINERALS) TABS tablet Take 1 tablet by mouth daily.   Multiple Vitamins-Minerals (PRESERVISION AREDS PO) Take 1 capsule by mouth in the morning and at bedtime. Unknown strenght   rosuvastatin (CRESTOR) 10 MG tablet Take 1 tablet (10 mg total) by mouth daily.   valsartan (DIOVAN) 40 MG tablet Take 1 tablet (40 mg total) by mouth daily.     Allergies:   Ace inhibitors and Ether   Social History   Socioeconomic History   Marital status: Married    Spouse name: Not on file   Number of children: Not on file   Years of education: Not on file   Highest education level: Not on file  Occupational  History   Not on file  Tobacco Use   Smoking status: Former    Packs/day: 1.00    Types: Cigarettes    Quit date: 12/31/2017    Years since quitting: 4.2   Smokeless tobacco: Never  Vaping Use   Vaping Use: Never used  Substance and Sexual Activity   Alcohol use: No   Drug use: No   Sexual activity: Yes  Other Topics Concern   Not on file  Social History Narrative   Not on file   Social Determinants of Health    Financial Resource Strain: Low Risk    Difficulty of Paying Living Expenses: Not very hard  Food Insecurity: No Food Insecurity   Worried About Charity fundraiser in the Last Year: Never true   Ran Out of Food in the Last Year: Never true  Transportation Needs: No Transportation Needs   Lack of Transportation (Medical): No   Lack of Transportation (Non-Medical): No  Physical Activity: Sufficiently Active   Days of Exercise per Week: 5 days   Minutes of Exercise per Session: 30 min  Stress: No Stress Concern Present   Feeling of Stress : Only a little  Social Connections: Moderately Isolated   Frequency of Communication with Friends and Family: More than three times a week   Frequency of Social Gatherings with Friends and Family: More than three times a week   Attends Religious Services: Never   Marine scientist or Organizations: No   Attends Music therapist: Never   Marital Status: Married     Family History: The patient's family history includes CAD in her father; Cancer in her father; Colon cancer in her mother; Diabetes in her brother, father, and sister; Heart disease in her father; Hypertension in her father. ROS:   Please see the history of present illness.    All 14 point review of systems negative except as described per history of present illness  EKGs/Labs/Other Studies Reviewed:      Recent Labs: 10/21/2021: ALT 20; BUN 24; Creatinine, Ser 0.74; Hemoglobin 12.3; Platelets 369.0; Potassium 3.7; Sodium 137  Recent Lipid Panel    Component Value Date/Time   CHOL 147 10/21/2021 0839   CHOL 226 (H) 12/10/2020 1019   TRIG 51.0 10/21/2021 0839   HDL 68.00 10/21/2021 0839   HDL 62 12/10/2020 1019   CHOLHDL 2 10/21/2021 0839   VLDL 10.2 10/21/2021 0839   LDLCALC 68 10/21/2021 0839   LDLCALC 147 (H) 12/10/2020 1019    Physical Exam:    VS:  BP 124/78 (BP Location: Right Arm, Patient Position: Sitting)   Pulse 69   Ht '5\' 6"'$  (1.676 m)   Wt  199 lb (90.3 kg)   SpO2 96%   BMI 32.12 kg/m     Wt Readings from Last 3 Encounters:  03/24/22 199 lb (90.3 kg)  11/20/21 194 lb (88 kg)  10/21/21 197 lb 6.4 oz (89.5 kg)     GEN:  Well nourished, well developed in no acute distress HEENT: Normal NECK: No JVD; No carotid bruits LYMPHATICS: No lymphadenopathy CARDIAC: RRR, no murmurs, no rubs, no gallops RESPIRATORY:  Clear to auscultation without rales, wheezing or rhonchi  ABDOMEN: Soft, non-tender, non-distended MUSCULOSKELETAL:  No edema; No deformity  SKIN: Warm and dry LOWER EXTREMITIES: no swelling NEUROLOGIC:  Alert and oriented x 3 PSYCHIATRIC:  Normal affect   ASSESSMENT:    1. Paroxysmal atrial fibrillation (HCC)   2. Primary hypertension   3. Mixed  hyperlipidemia   4. Neurosarcoidosis    PLAN:    In order of problems listed above:  Paroxysmal atrial fibrillation continue anticoagulation denies having episodes.  Doing well Dyslipidemia I did review K PN which show me LDL of 68 HDL 68 as well.  She is taking Crestor 10 which I will continue. Essential hypertension blood pressure well controlled continue present management. Neurosarcoidosis stable.   Medication Adjustments/Labs and Tests Ordered: Current medicines are reviewed at length with the patient today.  Concerns regarding medicines are outlined above.  No orders of the defined types were placed in this encounter.  Medication changes: No orders of the defined types were placed in this encounter.   Signed, Park Liter, MD, [facility] 03/24/2022 10:11 AM    [place]

## 2022-04-02 ENCOUNTER — Ambulatory Visit (HOSPITAL_COMMUNITY)
Admission: RE | Admit: 2022-04-02 | Discharge: 2022-04-02 | Disposition: A | Discharge status: Home / Self Care | Payer: Medicare Other | Source: Ambulatory Visit | Attending: Internal Medicine | Admitting: Internal Medicine

## 2022-04-02 DIAGNOSIS — G9389 Other specified disorders of brain: Secondary | ICD-10-CM | POA: Diagnosis not present

## 2022-04-02 DIAGNOSIS — C719 Malignant neoplasm of brain, unspecified: Secondary | ICD-10-CM | POA: Diagnosis not present

## 2022-04-02 DIAGNOSIS — D8689 Sarcoidosis of other sites: Secondary | ICD-10-CM | POA: Insufficient documentation

## 2022-04-02 DIAGNOSIS — I639 Cerebral infarction, unspecified: Secondary | ICD-10-CM | POA: Diagnosis not present

## 2022-04-02 MED ORDER — GADOBUTROL 1 MMOL/ML IV SOLN
9.0000 mL | Freq: Once | INTRAVENOUS | Status: AC | PRN
  Administered 2022-04-02: 9 mL via INTRAVENOUS

## 2022-04-06 ENCOUNTER — Inpatient Hospital Stay: Payer: Medicare Other | Attending: Internal Medicine

## 2022-04-06 DIAGNOSIS — D496 Neoplasm of unspecified behavior of brain: Secondary | ICD-10-CM | POA: Insufficient documentation

## 2022-04-06 DIAGNOSIS — Z800 Family history of malignant neoplasm of digestive organs: Secondary | ICD-10-CM | POA: Insufficient documentation

## 2022-04-06 DIAGNOSIS — G373 Acute transverse myelitis in demyelinating disease of central nervous system: Secondary | ICD-10-CM | POA: Insufficient documentation

## 2022-04-06 DIAGNOSIS — Z87891 Personal history of nicotine dependence: Secondary | ICD-10-CM | POA: Insufficient documentation

## 2022-04-07 ENCOUNTER — Inpatient Hospital Stay (HOSPITAL_BASED_OUTPATIENT_CLINIC_OR_DEPARTMENT_OTHER): Payer: Medicare Other | Admitting: Internal Medicine

## 2022-04-07 ENCOUNTER — Other Ambulatory Visit: Payer: Self-pay

## 2022-04-07 ENCOUNTER — Ambulatory Visit: Payer: Medicare Other | Admitting: Internal Medicine

## 2022-04-07 VITALS — BP 121/70 | HR 61 | Temp 97.5°F | Resp 18 | Ht 66.0 in | Wt 201.2 lb

## 2022-04-07 DIAGNOSIS — Z87891 Personal history of nicotine dependence: Secondary | ICD-10-CM | POA: Diagnosis not present

## 2022-04-07 DIAGNOSIS — D8689 Sarcoidosis of other sites: Secondary | ICD-10-CM

## 2022-04-07 DIAGNOSIS — R569 Unspecified convulsions: Secondary | ICD-10-CM

## 2022-04-07 DIAGNOSIS — Z800 Family history of malignant neoplasm of digestive organs: Secondary | ICD-10-CM | POA: Diagnosis not present

## 2022-04-07 DIAGNOSIS — D496 Neoplasm of unspecified behavior of brain: Secondary | ICD-10-CM | POA: Diagnosis not present

## 2022-04-07 DIAGNOSIS — G373 Acute transverse myelitis in demyelinating disease of central nervous system: Secondary | ICD-10-CM | POA: Diagnosis not present

## 2022-04-07 NOTE — Progress Notes (Signed)
[facility] [place], [place] [address] [phone]   Interval Evaluation  Date of Service: 04/07/22 Patient Name: Tara [name] Patient MRN: [id number] Patient DOB: May 24, 1951 Provider: [name], MD  Identifying Statement:  Tara [name] is a 71 y.o. female with left frontal  brain lesion , transverse myelitis  Interval History:  SERYNA MAREK presents today for follow up after recent MRI brain.  No significant clinical changes today.  She remains active and independent.  Continues with baseline mild word finding/expression difficulty.  No recent headaches.   Medications: Current Outpatient Medications on File Prior to Visit  Medication Sig Dispense Refill   apixaban (ELIQUIS) 5 MG TABS tablet Take 1 tablet (5 mg total) by mouth 2 (two) times daily. 60 tablet 12   aspirin EC 81 MG tablet Take 1 tablet (81 mg total) by mouth daily.     diltiazem (CARDIZEM CD) 360 MG 24 hr capsule Take 1 capsule (360 mg total) by mouth daily. 90 capsule 3   fluticasone (FLONASE) 50 MCG/ACT nasal spray Place 1 spray into both nostrils daily. (Patient taking differently: Place 1 spray into both nostrils daily as needed for allergies.) 16 g 0   hydrochlorothiazide (HYDRODIURIL) 25 MG tablet Take 1 tablet (25 mg total) by mouth daily. 90 tablet 3   levETIRAcetam (KEPPRA) 250 MG tablet Take 1 tablet (250 mg total) by mouth 2 (two) times daily. 180 tablet 1   loratadine (CLARITIN) 10 MG tablet Take 1 tablet (10 mg total) by mouth daily as needed for allergies, rhinitis or itching. 30 tablet 0   Multiple Vitamin (MULTIVITAMIN WITH MINERALS) TABS tablet Take 1 tablet by mouth daily.     Multiple Vitamins-Minerals (PRESERVISION AREDS PO) Take 1 capsule by mouth in the morning and at bedtime. Unknown strenght     rosuvastatin (CRESTOR) 10 MG tablet Take 1 tablet (10 mg total) by mouth daily. 90 tablet 3   valsartan (DIOVAN) 40 MG tablet  Take 1 tablet (40 mg total) by mouth daily. 90 tablet 3   No current facility-administered medications on file prior to visit.    Allergies:  Allergies  Allergen Reactions   Ace Inhibitors Cough   Ether Other (See Comments)    unknown   Past Medical History:  Past Medical History:  Diagnosis Date   Sarcoidosis    Past Surgical History:  Past Surgical History:  Procedure Laterality Date   APPLICATION OF CRANIAL NAVIGATION N/A 01/04/2018   Procedure: APPLICATION OF CRANIAL NAVIGATION;  Surgeon: Erline Levine, MD;  Location: [facility];  Service: Neurosurgery;  Laterality: N/A;   BRAIN SURGERY     COLONOSCOPY WITH PROPOFOL N/A 11/20/2021   Procedure: COLONOSCOPY WITH PROPOFOL;  Surgeon: Rush Landmark Telford Nab., MD;  Location: [facility];  Service: Gastroenterology;  Laterality: N/A;   HEMOSTASIS CLIP PLACEMENT  11/20/2021   Procedure: HEMOSTASIS CLIP PLACEMENT;  Surgeon: Irving Copas., MD;  Location: [facility];  Service: Gastroenterology;;   POLYPECTOMY  11/20/2021   Procedure: POLYPECTOMY;  Surgeon: Irving Copas., MD;  Location: [facility];  Service: Gastroenterology;;   PR DURAL GRAFT REPAIR,SPINE DEFECT Left 01/04/2018   Procedure: Left Pterional craniotomy for biopsy with Brainlab;  Surgeon: Erline Levine, MD;  Location: [facility];  Service: Neurosurgery;  Laterality: Left;  Left Pterional craniotomy for biopsy with Brainlab   SCLEROTHERAPY  11/20/2021   Procedure: SCLEROTHERAPY;  Surgeon: Mansouraty, Telford Nab., MD;  Location: [facility];  Service:  Gastroenterology;;   Social History:  Social History   Socioeconomic History   Marital status: Married    Spouse name: Not on file   Number of children: Not on file   Years of education: Not on file   Highest education level: Not on file  Occupational History   Not on file  Tobacco Use   Smoking status: Former    Packs/day: 1.00    Types: Cigarettes    Quit date: 12/31/2017    Years since quitting: 4.2    Smokeless tobacco: Never  Vaping Use   Vaping Use: Never used  Substance and Sexual Activity   Alcohol use: No   Drug use: No   Sexual activity: Yes  Other Topics Concern   Not on file  Social History Narrative   Not on file   Social Determinants of Health   Financial Resource Strain: Low Risk    Difficulty of Paying Living Expenses: Not very hard  Food Insecurity: No Food Insecurity   Worried About Running Out of Food in the Last Year: Never true   [place] in the Last Year: Never true  Transportation Needs: No Transportation Needs   Lack of Transportation (Medical): No   Lack of Transportation (Non-Medical): No  Physical Activity: Sufficiently Active   Days of Exercise per Week: 5 days   Minutes of Exercise per Session: 30 min  Stress: No Stress Concern Present   Feeling of Stress : Only a little  Social Connections: Moderately Isolated   Frequency of Communication with Friends and Family: More than three times a week   Frequency of Social Gatherings with Friends and Family: More than three times a week   Attends Religious Services: Never   Marine scientist or Organizations: No   Attends Music therapist: Never   Marital Status: Married  Human resources officer Violence: Not At Risk   Fear of Current or Ex-Partner: No   Emotionally Abused: No   Physically Abused: No   Sexually Abused: No   Family History:  Family History  Problem Relation Age of Onset   Colon cancer Mother    CAD Father    Cancer Father    Diabetes Father    Heart disease Father    Hypertension Father    Diabetes Sister    Diabetes Brother     Review of Systems: Constitutional: Denies fevers, chills or abnormal weight loss Eyes: Denies blurriness of vision Ears, nose, mouth, throat, and face: +hair loss Respiratory: Denies cough, dyspnea or wheezes Cardiovascular: Denies palpitation, chest discomfort or lower extremity swelling Gastrointestinal:  Denies nausea,  constipation, diarrhea GU: Denies dysuria or incontinence Skin: Denies abnormal skin rashes Neurological: Per HPI Musculoskeletal: Denies joint pain, back or neck discomfort. No decrease in ROM Behavioral/Psych: +anxiety  Physical Exam: There were no vitals filed for this visit.  KPS: 90. General: Alert, cooperative, pleasant, in no acute distress Head: Normal EENT: No conjunctival injection or scleral icterus. Oral mucosa moist Lungs: Resp effort normal Cardiac: Regular rate and rhythm Abdomen: Soft, non-distended abdomen Skin: No rashes cyanosis or petechiae. Extremities: no edema  Neurologic Exam: Mental Status: Awake, alert, attentive to examiner. Oriented to self and environment. Language is fluent with intact comprehension.  Cranial Nerves: Visual acuity is grossly normal. Visual fields are full. Extra-ocular movements intact. No ptosis. Face is symmetric, tongue midline. Motor: Tone and bulk are normal. Power is full in both arms and legs. Reflexes are symmetric, no pathologic reflexes present.  Intact finger to nose bilaterally Sensory: Intact to light touch and temperature Gait: Normal and tandem gait is deferred   Labs: I have reviewed the data as listed    Component Value Date/Time   NA 137 10/21/2021 0839   NA 139 12/10/2020 1019   K 3.7 10/21/2021 0839   CL 100 10/21/2021 0839   CO2 30 10/21/2021 0839   GLUCOSE 94 10/21/2021 0839   BUN 24 (H) 10/21/2021 0839   BUN 20 12/10/2020 1019   CREATININE 0.74 10/21/2021 0839   CALCIUM 9.6 10/21/2021 0839   PROT 7.3 10/21/2021 0839   PROT 6.9 12/10/2020 1019   ALBUMIN 4.3 10/21/2021 0839   ALBUMIN 4.5 12/10/2020 1019   AST 19 10/21/2021 0839   ALT 20 10/21/2021 0839   ALKPHOS 104 10/21/2021 0839   BILITOT 0.6 10/21/2021 0839   BILITOT 0.4 12/10/2020 1019   GFRNONAA 98 12/10/2020 1019   GFRNONAA >60 10/08/2020 0322   GFRAA 113 12/10/2020 1019   Lab Results  Component Value Date   WBC 5.1 10/21/2021    NEUTROABS 6.3 10/07/2020   HGB 12.3 10/21/2021   HCT 36.7 10/21/2021   MCV 93.2 10/21/2021   PLT 369.0 10/21/2021    Imaging:  [place] [facility] Clinician Interpretation: I have personally reviewed the CNS images as listed.  My interpretation, in the context of the patient's clinical presentation, is stable disease  MR BRAIN W WO CONTRAST  Result Date: 04/02/2022 CLINICAL DATA:  Brain/CNS neoplasm, assess treatment response EXAM: MRI HEAD WITHOUT AND WITH CONTRAST TECHNIQUE: Multiplanar, multiecho pulse sequences of the brain and surrounding structures were obtained without and with intravenous contrast. CONTRAST:  65m GADAVIST GADOBUTROL 1 MMOL/ML IV SOLN COMPARISON:  MRI September 30, 2021. FINDINGS: Brain: No substantial change in extra-axial, dural-based homogeneously enhancing 9 x 9 mm focus of enhancement along the anterior knee inferior left frontal lobe, compatible with meningioma. Similar adjacent T2/FLAIR hyperintensity without significant mass effect. No change in ill-defined enhancement along the inferior margin of the craniotomy. No new areas of abnormal enhancement. Unchanged encephalomalacia in the anterior left temporal lobe. 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 No evidence of acute infarct, acute hemorrhage, midline shift, hydrocephalus, or extra-axial fluid collection. Vascular: Major arterial flow voids are maintained skull base. Skull and upper cervical spine: Normal marrow signal. Sinuses/Orbits: Clear sinuses. No acute orbital findings. Other: No mastoid effusions. IMPRESSION: 1. No change in 9 mm dural-based lesion along the anterior/inferior left frontal convexity, compatible with meningioma. 2. No change in enhancement along the anterior margin of the craniotomy, which could represent granulation tissue versus intra osseous meningioma. 3. Other chronic changes are detailed above. Electronically Signed   By:  FMargaretha SheffieldM.D.   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 MBleeckeris clinically and radiographically stable today.  No new or progressive changes.  Underlying etiology is likely inflammatory rather than neoplastic, and have greatest suspicion for neurosarcoidosis.    She will con't to follow with neuro[facility]immun[facility]    No further steroids or immunomodulation at this time.  She may discontinue keppra 2/2 multiple years seizure-free.  We ask that CMora Bellmanreturn to clinic in 12 months following next brain MRI, or sooner as needed.   We appreciate the opportunity to participate in the care of [employer]   All questions were answered. The patient knows to call the clinic  with any problems, questions or concerns. No barriers to learning were detected.  The total time spent in the encounter was 30 minutes and more than 50% was on counseling and review of test results   [name], MD Medical Director of Neuro-Oncology [facility] at [place] 04/07/22 9:00 AM

## 2022-05-27 DIAGNOSIS — Z7901 Long term (current) use of anticoagulants: Secondary | ICD-10-CM | POA: Diagnosis not present

## 2022-05-27 DIAGNOSIS — H353232 Exudative age-related macular degeneration, bilateral, with inactive choroidal neovascularization: Secondary | ICD-10-CM | POA: Diagnosis not present

## 2022-05-27 DIAGNOSIS — H353133 Nonexudative age-related macular degeneration, bilateral, advanced atrophic without subfoveal involvement: Secondary | ICD-10-CM | POA: Diagnosis not present

## 2022-05-27 DIAGNOSIS — H43813 Vitreous degeneration, bilateral: Secondary | ICD-10-CM | POA: Diagnosis not present

## 2022-05-27 DIAGNOSIS — Z961 Presence of intraocular lens: Secondary | ICD-10-CM | POA: Diagnosis not present

## 2022-06-03 ENCOUNTER — Telehealth: Payer: Self-pay

## 2022-06-03 DIAGNOSIS — H353132 Nonexudative age-related macular degeneration, bilateral, intermediate dry stage: Secondary | ICD-10-CM | POA: Diagnosis not present

## 2022-06-03 DIAGNOSIS — H5347 Heteronymous bilateral field defects: Secondary | ICD-10-CM | POA: Diagnosis not present

## 2022-06-03 DIAGNOSIS — H02831 Dermatochalasis of right upper eyelid: Secondary | ICD-10-CM | POA: Diagnosis not present

## 2022-06-03 DIAGNOSIS — H26493 Other secondary cataract, bilateral: Secondary | ICD-10-CM | POA: Diagnosis not present

## 2022-06-03 DIAGNOSIS — H1045 Other chronic allergic conjunctivitis: Secondary | ICD-10-CM | POA: Diagnosis not present

## 2022-06-03 DIAGNOSIS — Z961 Presence of intraocular lens: Secondary | ICD-10-CM | POA: Diagnosis not present

## 2022-06-03 MED ORDER — VALSARTAN 40 MG PO TABS: 40.0000 mg | ORAL_TABLET | Freq: Every day | ORAL | 3 refills | Status: DC

## 2022-06-03 NOTE — Telephone Encounter (Signed)
Pt is no longer using the old pharmacy [facility] where she has remaining refills.  Pt is requesting a refill on: valsartan (DIOVAN) 40 MG tablet  Pharmacy: [facility] [id number] [place], [place] 10/21/22 ROV 10/28/22

## 2022-06-03 NOTE — Telephone Encounter (Signed)
Refill has been sent to the pt's pharmacy  

## 2022-07-23 ENCOUNTER — Other Ambulatory Visit: Payer: Self-pay | Admitting: Cardiology

## 2022-07-23 DIAGNOSIS — I480 Paroxysmal atrial fibrillation: Secondary | ICD-10-CM

## 2022-08-03 ENCOUNTER — Ambulatory Visit (INDEPENDENT_AMBULATORY_CARE_PROVIDER_SITE_OTHER): Payer: Medicare Other

## 2022-08-03 VITALS — Ht 66.0 in | Wt 190.0 lb

## 2022-08-03 DIAGNOSIS — Z1231 Encounter for screening mammogram for malignant neoplasm of breast: Secondary | ICD-10-CM

## 2022-08-03 DIAGNOSIS — Z0000 Encounter for general adult medical examination without abnormal findings: Secondary | ICD-10-CM | POA: Diagnosis not present

## 2022-08-03 DIAGNOSIS — Z780 Asymptomatic menopausal state: Secondary | ICD-10-CM

## 2022-08-03 NOTE — Progress Notes (Signed)
Subjective:   Tara [name] is a 71 y.o. female who presents for Medicare Annual (Subsequent) preventive examination.   Virtual Visit via Telephone Note  I connected with  Tara [name] on 08/03/22 at  8:15 AM EDT by telephone and verified that I am speaking with the correct person using two identifiers.  Location: Patient: home  Provider: Esmond Plants  Persons participating in the virtual visit: [place]   I discussed the limitations, risks, security and privacy concerns of performing an evaluation and management service by telephone and the availability of in person appointments. The patient expressed understanding and agreed to proceed.  Interactive audio and video telecommunications were attempted between this nurse and patient, however failed, due to patient having technical difficulties OR patient did not have access to video capability.  We continued and completed visit with audio only.  Some vital signs may be absent or patient reported.   Tara Shepherd, LPN  Review of Systems     Cardiac Risk Factors include: advanced age (>45mn, >>67women);hypertension     Objective:    Today's Vitals   08/03/22 0820  Weight: 190 lb (86.2 kg)  Height: '5\' 6"'$  (1.676 m)   Body mass index is 30.67 kg/m.     08/03/2022    8:25 AM 04/07/2022    9:13 AM 11/20/2021   10:08 AM 10/07/2021   12:16 PM 07/28/2021    5:45 PM 10/08/2020   12:00 AM 10/07/2020   11:22 AM  Advanced Directives  Does Patient Have a Medical Advance Directive? Yes Yes Yes No Yes No No  Type of AParamedicof [place]orth [place] will [place] will [place] will      Does patient want to make changes to medical advance directive?     Yes (Inpatient - patient defers changing a medical advance directive and declines information at this time)    Copy of [place] Chart? No - copy requested No -  copy requested No - copy requested      Would patient like information on creating a medical advance directive?    No - Patient declined  No - Patient declined No - Patient declined    Current Medications (verified) Outpatient Encounter Medications as of 08/03/2022  Medication Sig   aspirin EC 81 MG tablet Take 1 tablet (81 mg total) by mouth daily.   diltiazem (CARDIZEM CD) 360 MG 24 hr capsule Take 1 capsule (360 mg total) by mouth daily.   ELIQUIS 5 MG TABS tablet TAKE ONE TABLET BY MOUTH TWICE A DAY   fluticasone (FLONASE) 50 MCG/ACT nasal spray Place 1 spray into both nostrils daily. (Patient taking differently: Place 1 spray into both nostrils daily as needed for allergies.)   hydrochlorothiazide (HYDRODIURIL) 25 MG tablet Take 1 tablet (25 mg total) by mouth daily.   loratadine (CLARITIN) 10 MG tablet Take 1 tablet (10 mg total) by mouth daily as needed for allergies, rhinitis or itching.   Multiple Vitamin (MULTIVITAMIN WITH MINERALS) TABS tablet Take 1 tablet by mouth daily.   Multiple Vitamins-Minerals (PRESERVISION AREDS PO) Take 1 capsule by mouth in the morning and at bedtime. Unknown strenght   rosuvastatin (CRESTOR) 10 MG tablet Take 1 tablet (10 mg total) by mouth daily.   valsartan (DIOVAN) 40 MG tablet Take 1 tablet (40 mg total) by mouth daily.   levETIRAcetam (KEPPRA) 250 MG tablet Take 1 tablet (250 mg total) by  mouth 2 (two) times daily. (Patient not taking: Reported on 08/03/2022)   No facility-administered encounter medications on file as of 08/03/2022.    Allergies (verified) Ace inhibitors and Ether   History: Past Medical History:  Diagnosis Date   Sarcoidosis    Past Surgical History:  Procedure Laterality Date   APPLICATION OF CRANIAL NAVIGATION N/A 01/04/2018   Procedure: APPLICATION OF CRANIAL NAVIGATION;  Surgeon: Erline Levine, MD;  Location: [facility];  Service: Neurosurgery;  Laterality: N/A;   BRAIN SURGERY     COLONOSCOPY WITH PROPOFOL N/A 11/20/2021    Procedure: COLONOSCOPY WITH PROPOFOL;  Surgeon: Rush Landmark Telford Nab., MD;  Location: [facility];  Service: Gastroenterology;  Laterality: N/A;   HEMOSTASIS CLIP PLACEMENT  11/20/2021   Procedure: HEMOSTASIS CLIP PLACEMENT;  Surgeon: Irving Copas., MD;  Location: [facility];  Service: Gastroenterology;;   POLYPECTOMY  11/20/2021   Procedure: POLYPECTOMY;  Surgeon: Irving Copas., MD;  Location: [facility];  Service: Gastroenterology;;   PR DURAL GRAFT REPAIR,SPINE DEFECT Left 01/04/2018   Procedure: Left Pterional craniotomy for biopsy with Brainlab;  Surgeon: Erline Levine, MD;  Location: [facility];  Service: Neurosurgery;  Laterality: Left;  Left Pterional craniotomy for biopsy with Brainlab   SCLEROTHERAPY  11/20/2021   Procedure: SCLEROTHERAPY;  Surgeon: Mansouraty, Telford Nab., MD;  Location: [facility] ENDOSCOPY;  Service: Gastroenterology;;   Family History  Problem Relation Age of Onset   Colon cancer Mother    CAD Father    Cancer Father    Diabetes Father    Heart disease Father    Hypertension Father    Diabetes Sister    Diabetes Brother    Social History   Socioeconomic History   Marital status: Married    Spouse name: Not on file   Number of children: Not on file   Years of education: Not on file   Highest education level: Not on file  Occupational History   Not on file  Tobacco Use   Smoking status: Former    Packs/day: 1.00    Types: Cigarettes    Quit date: 12/31/2017    Years since quitting: 4.5   Smokeless tobacco: Never  Vaping Use   Vaping Use: Never used  Substance and Sexual Activity   Alcohol use: No   Drug use: No   Sexual activity: Yes  Other Topics Concern   Not on file  Social History Narrative   Not on file   Social Determinants of Health   Financial Resource Strain: Low Risk  (08/03/2022)   Overall Financial Resource Strain (CARDIA)    Difficulty of Paying Living Expenses: Not hard at all  Food Insecurity: No Food Insecurity  (08/03/2022)   Hunger Vital Sign    Worried About Running Out of Food in the Last Year: Never true    [place] in the Last Year: Never true  Transportation Needs: No Transportation Needs (08/03/2022)   PRAPARE - Hydrologist (Medical): No    Lack of Transportation (Non-Medical): No  Physical Activity: Sufficiently Active (08/03/2022)   Exercise Vital Sign    Days of Exercise per Week: 3 days    Minutes of Exercise per Session: 60 min  Stress: No Stress Concern Present (08/03/2022)   [place]    Feeling of Stress : Not at all  Social Connections: Moderately Integrated (08/03/2022)   Social Connection and Isolation Panel [NHANES]    Frequency of  Communication with Friends and Family: More than three times a week    Frequency of Social Gatherings with Friends and Family: More than three times a week    Attends Religious Services: Never    Marine scientist or Organizations: Yes    Attends Music therapist: More than 4 times per year    Marital Status: Married    Tobacco Counseling Counseling given: Not Answered   Clinical Intake:  Pre-visit preparation completed: Yes  Pain : No/denies pain     Nutritional Risks: None Diabetes: No  How often do you need to have someone help you when you read instructions, pamphlets, or other written materials from your doctor or pharmacy?: 1 - Never  Diabetic?mno   Interpreter Needed?: No  Information entered by :: Jadene Pierini, LPN   Activities of Daily Living    08/03/2022    8:25 AM  In your present state of health, do you have any difficulty performing the following activities:  Hearing? 0  Vision? 0  Difficulty concentrating or making decisions? 0  Walking or climbing stairs? 0  Dressing or bathing? 0  Doing errands, shopping? 0  Preparing Food and eating ? N  Using the Toilet? N  In the past six months,  have you accidently leaked urine? N  Do you have problems with loss of bowel control? N  Managing your Medications? N  Managing your Finances? N  Housekeeping or managing your Housekeeping? N    Patient Care Team: Hoyt Koch, MD as PCP - General (Internal Medicine) Park Liter, MD as PCP - Cardiology (Cardiology)  Indicate any recent Medical Services you may have received from other than [facility] providers in the past year (date may be approximate).     Assessment:   This is a routine wellness examination for Jacqueli.  Hearing/Vision screen Vision Screening - Comments:: Annual eye exams wear glasses   Dietary issues and exercise activities discussed: Current Exercise Habits: Home exercise routine, Type of exercise: walking, Time (Minutes): 60, Frequency (Times/Week): 3, Weekly Exercise (Minutes/Week): 180, Intensity: Mild, Exercise limited by: neurologic condition(s)   Goals Addressed             This Visit's Progress    DIET - INCREASE WATER INTAKE         Depression Screen    08/03/2022    8:24 AM 07/28/2021    5:38 PM 04/18/2021    9:02 AM 12/24/2020    8:36 AM 10/10/2020    3:24 PM 12/05/2019   10:15 AM 12/01/2019   11:06 AM  PHQ 2/9 Scores  PHQ - 2 Score 0 1 0 0 0 0 0    Fall Risk    08/03/2022    8:21 AM 07/28/2021    5:46 PM 04/18/2021    9:02 AM 12/24/2020    8:36 AM 10/10/2020    3:24 PM  Fall Risk   Falls in the past year? 0 0 1 0 0  Number falls in past yr: 0 0 0 0 0  Injury with Fall? 0 0 0 0 0  Risk for fall due to : No Fall Risks  No Fall Risks  No Fall Risks  Follow up Falls prevention discussed  Falls evaluation completed Falls evaluation completed Falls evaluation completed    [place]:  Any stairs in or around the home? Yes  If so, are there any without handrails? No  Home free of loose throw  rugs in walkways, pet beds, electrical cords, etc? Yes  Adequate lighting in your home to reduce risk of  falls? Yes   ASSISTIVE DEVICES UTILIZED TO PREVENT FALLS:  Life alert? Yes  Use of a cane, walker or w/c? No  Grab bars in the bathroom? Yes  Shower chair or bench in shower? Yes  Elevated toilet seat or a handicapped toilet? Yes          08/03/2022    8:26 AM 07/28/2021    6:01 PM 12/01/2019   11:06 AM  6CIT Screen  What Year? 0 points 0 points 0 points  What month? 0 points  0 points  What time? 0 points 0 points 0 points  Count back from 20 0 points 0 points 0 points  Months in reverse 0 points 0 points 0 points  Repeat phrase 0 points 0 points 0 points  Total Score 0 points  0 points    Immunizations Immunization History  Administered Date(s) Administered   Fluad Quad(high Dose 65+) 07/31/2019   Influenza, High Dose Seasonal PF 01/04/2018, 08/09/2018   Influenza,inj,Quad PF,6+ Mos 10/10/2020   Influenza-Unspecified 09/05/2021   PFIZER(Purple Top)SARS-COV-2 Vaccination 11/23/2019, 12/14/2019   Pneumococcal Polysaccharide-23 01/04/2018   Tdap 03/14/2018    TDAP status: Up to date  Flu Vaccine status: Due, Education has been provided regarding the importance of this vaccine. Advised may receive this vaccine at local pharmacy or Health Dept. Aware to provide a copy of the vaccination record if obtained from local pharmacy or Health Dept. Verbalized acceptance and understanding.  Pneumococcal vaccine status: Up to date  Covid-19 vaccine status: Completed vaccines  Qualifies for Shingles Vaccine? Yes   Zostavax completed No   Shingrix Completed?: No.    Education has been provided regarding the importance of this vaccine. Patient has been advised to call insurance company to determine out of pocket expense if they have not yet received this vaccine. Advised may also receive vaccine at local pharmacy or Health Dept. Verbalized acceptance and understanding.  Screening Tests Health Maintenance  Topic Date Due   MAMMOGRAM  Never done   Zoster Vaccines- Shingrix (1 of 2)  Never done   DEXA SCAN  Never done   Pneumonia Vaccine 55+ Years old (2 - PCV) 01/05/2019   COVID-19 Vaccine (3 - Pfizer series) 02/08/2020   INFLUENZA VACCINE  06/02/2022   Fecal DNA (Cologuard)  11/20/2024   TETANUS/TDAP  03/14/2028   Hepatitis C Screening  Completed   HPV VACCINES  Aged Out    Health Maintenance  Health Maintenance Due  Topic Date Due   MAMMOGRAM  Never done   Zoster Vaccines- Shingrix (1 of 2) Never done   DEXA SCAN  Never done   Pneumonia Vaccine 54+ Years old (2 - PCV) 01/05/2019   COVID-19 Vaccine (3 - Pfizer series) 02/08/2020   INFLUENZA VACCINE  06/02/2022    Colorectal cancer screening: Type of screening: Colonoscopy. Completed 11/20/2021. Repeat every 3 years  Mammogram status: Ordered 08/03/2022. Pt provided with contact info and advised to call to schedule appt.   Bone Density status: Ordered 08/03/2022. Pt provided with contact info and advised to call to schedule appt.  Lung Cancer Screening: (Low Dose CT Chest recommended if Age 43-80 years, 30 pack-year currently smoking OR have quit w/in 15years.) does not qualify.   Lung Cancer Screening Referral: n/a  Additional Screening:  Hepatitis C Screening: does not qualify;   Vision Screening: Recommended annual ophthalmology exams for early detection of glaucoma  and other disorders of the eye. Is the patient up to date with their annual eye exam?  Yes  Who is the provider or what is the name of the office in which the patient attends annual eye exams? Dr.Groat  If pt is not established with a provider, would they like to be referred to a provider to establish care? No .   Dental Screening: Recommended annual dental exams for proper oral hygiene  Community Resource Referral / Chronic Care Management: CRR required this visit?  No   CCM required this visit?  No      Plan:     I have personally reviewed and noted the following in the patient's chart:   Medical and social history Use of  alcohol, tobacco or illicit drugs  Current medications and supplements including opioid prescriptions. Patient is not currently taking opioid prescriptions. Functional ability and status Nutritional status Physical activity Advanced directives List of other physicians Hospitalizations, surgeries, and ER visits in previous 12 months Vitals Screenings to include cognitive, depression, and falls Referrals and appointments  In addition, I have reviewed and discussed with patient certain preventive protocols, quality metrics, and best practice recommendations. A written personalized care plan for preventive services as well as general preventive health recommendations were provided to patient.     Tara Shepherd, LPN   [id number]   Nurse Notes: Due Flu Vaccine

## 2022-08-03 NOTE — Patient Instructions (Signed)
Ms. Tara [name] , Thank you for taking time to come for your Medicare Wellness Visit. I appreciate your ongoing commitment to your health goals. Please review the following plan we discussed and let me know if I can assist you in the future.   These are the goals we discussed:  Goals      DIET - INCREASE WATER INTAKE        This is a list of the screening recommended for you and due dates:  Health Maintenance  Topic Date Due   Mammogram  Never done   Zoster (Shingles) Vaccine (1 of 2) Never done   DEXA scan (bone density measurement)  Never done   Pneumonia Vaccine (2 - PCV) 01/05/2019   COVID-19 Vaccine (3 - Pfizer series) 02/08/2020   Flu Shot  06/02/2022   Cologuard (Stool DNA test)  11/20/2024   Tetanus Vaccine  03/14/2028   Hepatitis C Screening: USPSTF Recommendation to screen - Ages 18-79 yo.  Completed   HPV Vaccine  Aged Out    Advanced directives: Please bring a copy of your health care power of attorney and living will to the office to be added to your chart at your convenience.   Conditions/risks identified: Aim for 30 minutes of exercise or brisk walking, 6-8 glasses of water, and 5 servings of fruits and vegetables each day.   Next appointment: Follow up in one year for your annual wellness visit    Preventive Care 65 Years and Older, Female Preventive care refers to lifestyle choices and visits with your health care provider that can promote health and wellness. What does preventive care include? A yearly physical exam. This is also called an annual well check. Dental exams once or twice a year. Routine eye exams. Ask your health care provider how often you should have your eyes checked. Personal lifestyle choices, including: Daily care of your teeth and gums. Regular physical activity. Eating a healthy diet. Avoiding tobacco and drug use. Limiting alcohol use. Practicing safe sex. Taking low-dose aspirin every day. Taking vitamin and mineral supplements as  recommended by your health care provider. What happens during an annual well check? The services and screenings done by your health care provider during your annual well check will depend on your age, overall health, lifestyle risk factors, and family history of disease. Counseling  Your health care provider may ask you questions about your: Alcohol use. Tobacco use. Drug use. Emotional well-being. Home and relationship well-being. Sexual activity. Eating habits. History of falls. Memory and ability to understand (cognition). Work and work Statistician. Reproductive health. Screening  You may have the following tests or measurements: Height, weight, and BMI. Blood pressure. Lipid and cholesterol levels. These may be checked every 5 years, or more frequently if you are over 71 years old. Skin check. Lung cancer screening. You may have this screening every year starting at age 71 if you have a 30-pack-year history of smoking and currently smoke or have quit within the past 15 years. Fecal occult blood test (FOBT) of the stool. You may have this test every year starting at age 71. Flexible sigmoidoscopy or colonoscopy. You may have a sigmoidoscopy every 5 years or a colonoscopy every 10 years starting at age 71. Hepatitis C blood test. Hepatitis B blood test. Sexually transmitted disease (STD) testing. Diabetes screening. This is done by checking your blood sugar (glucose) after you have not eaten for a while (fasting). You may have this done every 1-3 years. Bone density scan. This  is done to screen for osteoporosis. You may have this done starting at age 710. Mammogram. This may be done every 1-2 years. Talk to your health care provider about how often you should have regular mammograms. Talk with your health care provider about your test results, treatment options, and if necessary, the need for more tests. Vaccines  Your health care provider may recommend certain vaccines, such  as: Influenza vaccine. This is recommended every year. Tetanus, diphtheria, and acellular pertussis (Tdap, Td) vaccine. You may need a Td booster every 10 years. Zoster vaccine. You may need this after age 45. Pneumococcal 13-valent conjugate (PCV13) vaccine. One dose is recommended after age 71. Pneumococcal polysaccharide (PPSV23) vaccine. One dose is recommended after age 71. Talk to your health care provider about which screenings and vaccines you need and how often you need them. This information is not intended to replace advice given to you by your health care provider. Make sure you discuss any questions you have with your health care provider. Document Released: 11/15/2015 Document Revised: 07/08/2016 Document Reviewed: 08/20/2015 Elsevier Interactive Patient Education  2017 [employer] Prevention in the Home Falls can cause injuries. They can happen to people of all ages. There are many things you can do to make your home safe and to help prevent falls. What can I do on the outside of my home? Regularly fix the edges of walkways and driveways and fix any cracks. Remove anything that might make you trip as you walk through a door, such as a raised step or threshold. Trim any bushes or trees on the path to your home. Use bright outdoor lighting. Clear any walking paths of anything that might make someone trip, such as rocks or tools. Regularly check to see if handrails are loose or broken. Make sure that both sides of any steps have handrails. Any raised decks and porches should have guardrails on the edges. Have any leaves, snow, or ice cleared regularly. Use sand or salt on walking paths during winter. Clean up any spills in your garage right away. This includes oil or grease spills. What can I do in the bathroom? Use night lights. Install grab bars by the toilet and in the tub and shower. Do not use towel bars as grab bars. Use non-skid mats or decals in the tub or  shower. If you need to sit down in the shower, use a plastic, non-slip stool. Keep the floor dry. Clean up any water that spills on the floor as soon as it happens. Remove soap buildup in the tub or shower regularly. Attach bath mats securely with double-sided non-slip rug tape. Do not have throw rugs and other things on the floor that can make you trip. What can I do in the bedroom? Use night lights. Make sure that you have a light by your bed that is easy to reach. Do not use any sheets or blankets that are too big for your bed. They should not hang down onto the floor. Have a firm chair that has side arms. You can use this for support while you get dressed. Do not have throw rugs and other things on the floor that can make you trip. What can I do in the kitchen? Clean up any spills right away. Avoid walking on wet floors. Keep items that you use a lot in easy-to-reach places. If you need to reach something above you, use a strong step stool that has a grab bar. Keep electrical cords out  of the way. Do not use floor polish or wax that makes floors slippery. If you must use wax, use non-skid floor wax. Do not have throw rugs and other things on the floor that can make you trip. What can I do with my stairs? Do not leave any items on the stairs. Make sure that there are handrails on both sides of the stairs and use them. Fix handrails that are broken or loose. Make sure that handrails are as long as the stairways. Check any carpeting to make sure that it is firmly attached to the stairs. Fix any carpet that is loose or worn. Avoid having throw rugs at the top or bottom of the stairs. If you do have throw rugs, attach them to the floor with carpet tape. Make sure that you have a light switch at the top of the stairs and the bottom of the stairs. If you do not have them, ask someone to add them for you. What else can I do to help prevent falls? Wear shoes that: Do not have high heels. Have  rubber bottoms. Are comfortable and fit you well. Are closed at the toe. Do not wear sandals. If you use a stepladder: Make sure that it is fully opened. Do not climb a closed stepladder. Make sure that both sides of the stepladder are locked into place. Ask someone to hold it for you, if possible. Clearly mark and make sure that you can see: Any grab bars or handrails. First and last steps. Where the edge of each step is. Use tools that help you move around (mobility aids) if they are needed. These include: Canes. Walkers. Scooters. Crutches. Turn on the lights when you go into a dark area. Replace any light bulbs as soon as they burn out. Set up your furniture so you have a clear path. Avoid moving your furniture around. If any of your floors are uneven, fix them. If there are any pets around you, be aware of where they are. Review your medicines with your doctor. Some medicines can make you feel dizzy. This can increase your chance of falling. Ask your doctor what other things that you can do to help prevent falls. This information is not intended to replace advice given to you by your health care provider. Make sure you discuss any questions you have with your health care provider. Document Released: 08/15/2009 Document Revised: 03/26/2016 Document Reviewed: 11/23/2014 Elsevier Interactive Patient Education  2017 [employer].

## 2022-08-15 ENCOUNTER — Other Ambulatory Visit: Payer: Self-pay | Admitting: Cardiology

## 2022-08-20 DIAGNOSIS — H353232 Exudative age-related macular degeneration, bilateral, with inactive choroidal neovascularization: Secondary | ICD-10-CM | POA: Diagnosis not present

## 2022-08-20 DIAGNOSIS — H353133 Nonexudative age-related macular degeneration, bilateral, advanced atrophic without subfoveal involvement: Secondary | ICD-10-CM | POA: Diagnosis not present

## 2022-08-20 DIAGNOSIS — Z961 Presence of intraocular lens: Secondary | ICD-10-CM | POA: Diagnosis not present

## 2022-08-20 DIAGNOSIS — H43813 Vitreous degeneration, bilateral: Secondary | ICD-10-CM | POA: Diagnosis not present

## 2022-08-20 DIAGNOSIS — Z7901 Long term (current) use of anticoagulants: Secondary | ICD-10-CM | POA: Diagnosis not present

## 2022-10-13 ENCOUNTER — Other Ambulatory Visit: Payer: Self-pay | Admitting: Cardiology

## 2022-10-13 DIAGNOSIS — I480 Paroxysmal atrial fibrillation: Secondary | ICD-10-CM

## 2022-10-13 NOTE — Telephone Encounter (Signed)
Prescription refill request for Eliquis received. Indication:afib Last office visit:5/23 Scr:0.7 Age: 71 Weight:86.2 kg  Prescription refilled

## 2022-10-22 ENCOUNTER — Encounter: Payer: Self-pay | Admitting: Internal Medicine

## 2022-10-22 ENCOUNTER — Ambulatory Visit (INDEPENDENT_AMBULATORY_CARE_PROVIDER_SITE_OTHER): Payer: Medicare Other | Admitting: Internal Medicine

## 2022-10-22 VITALS — BP 140/100 | HR 75 | Temp 98.0°F | Ht 66.0 in | Wt 206.0 lb

## 2022-10-22 DIAGNOSIS — I480 Paroxysmal atrial fibrillation: Secondary | ICD-10-CM | POA: Diagnosis not present

## 2022-10-22 DIAGNOSIS — R739 Hyperglycemia, unspecified: Secondary | ICD-10-CM | POA: Diagnosis not present

## 2022-10-22 DIAGNOSIS — I10 Essential (primary) hypertension: Secondary | ICD-10-CM

## 2022-10-22 DIAGNOSIS — E782 Mixed hyperlipidemia: Secondary | ICD-10-CM | POA: Diagnosis not present

## 2022-10-22 DIAGNOSIS — Z23 Encounter for immunization: Secondary | ICD-10-CM

## 2022-10-22 DIAGNOSIS — I2510 Atherosclerotic heart disease of native coronary artery without angina pectoris: Secondary | ICD-10-CM | POA: Diagnosis not present

## 2022-10-22 LAB — CBC
HCT: 39.1 % (ref 36.0–46.0)
Hemoglobin: 13.2 g/dL (ref 12.0–15.0)
MCHC: 33.7 g/dL (ref 30.0–36.0)
MCV: 94.6 fl (ref 78.0–100.0)
Platelets: 379 10*3/uL (ref 150.0–400.0)
RBC: 4.13 Mil/uL (ref 3.87–5.11)
RDW: 13 % (ref 11.5–15.5)
WBC: 4.7 10*3/uL (ref 4.0–10.5)

## 2022-10-22 LAB — COMPREHENSIVE METABOLIC PANEL
ALT: 14 U/L (ref 0–35)
AST: 15 U/L (ref 0–37)
Albumin: 4.4 g/dL (ref 3.5–5.2)
Alkaline Phosphatase: 110 U/L (ref 39–117)
BUN: 26 mg/dL — ABNORMAL HIGH (ref 6–23)
CO2: 31 mEq/L (ref 19–32)
Calcium: 9.5 mg/dL (ref 8.4–10.5)
Chloride: 102 mEq/L (ref 96–112)
Creatinine, Ser: 0.77 mg/dL (ref 0.40–1.20)
GFR: 77.57 mL/min (ref >60.00)
Glucose, Bld: 92 mg/dL (ref 70–99)
Potassium: 3.7 mEq/L (ref 3.5–5.1)
Sodium: 140 mEq/L (ref 135–145)
Total Bilirubin: 0.6 mg/dL (ref 0.2–1.2)
Total Protein: 7.3 g/dL (ref 6.0–8.3)

## 2022-10-22 LAB — HEMOGLOBIN A1C: Hgb A1c MFr Bld: 6.1 % (ref 4.6–6.5)

## 2022-10-22 LAB — LIPID PANEL
Cholesterol: 145 mg/dL (ref 0–200)
HDL: 72.8 mg/dL (ref >39.00)
LDL Cholesterol: 64 mg/dL (ref 0–99)
NonHDL: 72.45
Total CHOL/HDL Ratio: 2
Triglycerides: 42 mg/dL (ref 0.0–149.0)
VLDL: 8.4 mg/dL (ref 0.0–40.0)

## 2022-10-22 NOTE — Assessment & Plan Note (Signed)
Taking diltiazem 360 mg daily for rate control and eliquis 5 mg BID for stroke prevention. Sounds regular today but EKG not done. No clinical signs of bleeding. Checking CMP and CBC. Adjust as needed.

## 2022-10-22 NOTE — Assessment & Plan Note (Signed)
Checking lipid panel and adjust crestor 10 mg daily as needed. LDL goal <100.

## 2022-10-22 NOTE — Assessment & Plan Note (Signed)
BP elevated today due to not taking meds prior to visit. Previously at goal recent visit. Will keep diltiazem 360 mg daily and hctz 25 mg daily and valsartan 40 mg daily. Checking CMP and adjust as needed.

## 2022-10-22 NOTE — Progress Notes (Signed)
   Subjective:   Patient ID: Tara [name], female    DOB: 10-06-1951, 71 y.o.   MRN: [id number]  HPI The patient is a 71 YO female coming in for follow up.  Review of Systems  Constitutional:  Positive for unexpected weight change.  HENT: Negative.    Eyes: Negative.   Respiratory:  Negative for cough, chest tightness and shortness of breath.   Cardiovascular:  Negative for chest pain, palpitations and leg swelling.  Gastrointestinal:  Negative for abdominal distention, abdominal pain, constipation, diarrhea, nausea and vomiting.  Musculoskeletal: Negative.   Skin: Negative.   Neurological: Negative.   Psychiatric/Behavioral:  Positive for sleep disturbance.     Objective:  Physical Exam Constitutional:      Appearance: She is well-developed.  HENT:     Head: Normocephalic and atraumatic.  Cardiovascular:     Rate and Rhythm: Normal rate and regular rhythm.  Pulmonary:     Effort: Pulmonary effort is normal. No respiratory distress.     Breath sounds: Normal breath sounds. No wheezing or rales.  Abdominal:     General: Bowel sounds are normal. There is no distension.     Palpations: Abdomen is soft.     Tenderness: There is no abdominal tenderness. There is no rebound.  Musculoskeletal:     Cervical back: Normal range of motion.  Skin:    General: Skin is warm and dry.  Neurological:     Mental Status: She is alert and oriented to person, place, and time.     Coordination: Coordination normal.     Vitals:   10/22/22 1046 10/22/22 1052  BP: (!) 140/100 (!) 140/100  Pulse: 75   Temp: 98 F (36.7 C)   TempSrc: Oral   SpO2: 95%   Weight: 206 lb (93.4 kg)   Height: '5\' 6"'$  (1.676 m)     Assessment & Plan:  Flu shot given at visit

## 2022-10-22 NOTE — Patient Instructions (Signed)
We will check the labs today.   Think about metformin for the weight and pre-diabetes to help.

## 2022-10-22 NOTE — Assessment & Plan Note (Signed)
Checking HGA1c and adjust as needed. We discussed metformin as preventative and weight loss potentially if still in pre-diabetes range.

## 2022-10-23 ENCOUNTER — Other Ambulatory Visit: Payer: Self-pay | Admitting: Nurse Practitioner

## 2022-10-23 DIAGNOSIS — E669 Obesity, unspecified: Secondary | ICD-10-CM

## 2022-10-23 DIAGNOSIS — R7303 Prediabetes: Secondary | ICD-10-CM

## 2022-10-23 MED ORDER — METFORMIN HCL 500 MG PO TABS: 500.0000 mg | ORAL_TABLET | Freq: Every day | ORAL | 3 refills | Status: DC

## 2022-10-28 ENCOUNTER — Encounter: Payer: Medicare Other | Admitting: Internal Medicine

## 2022-11-03 ENCOUNTER — Encounter: Payer: Self-pay | Admitting: Cardiology

## 2022-11-03 ENCOUNTER — Ambulatory Visit: Payer: Medicare Other | Attending: Cardiology | Admitting: Cardiology

## 2022-11-03 VITALS — BP 124/71 | HR 71 | Ht 66.0 in | Wt 208.1 lb

## 2022-11-03 DIAGNOSIS — R569 Unspecified convulsions: Secondary | ICD-10-CM

## 2022-11-03 DIAGNOSIS — E782 Mixed hyperlipidemia: Secondary | ICD-10-CM | POA: Diagnosis not present

## 2022-11-03 DIAGNOSIS — D8689 Sarcoidosis of other sites: Secondary | ICD-10-CM

## 2022-11-03 DIAGNOSIS — R0609 Other forms of dyspnea: Secondary | ICD-10-CM | POA: Diagnosis not present

## 2022-11-03 DIAGNOSIS — I480 Paroxysmal atrial fibrillation: Secondary | ICD-10-CM | POA: Diagnosis not present

## 2022-11-03 NOTE — Patient Instructions (Addendum)

## 2022-11-03 NOTE — Progress Notes (Unsigned)
Cardiology Office Note:    Date:  11/03/2022   ID:  Tara [name], DOB [birth year], MRN [id number]  PCP:  Hoyt Koch, MD  Cardiologist:  Jenne Campus, MD    Referring MD: Hoyt Koch, *   Chief Complaint  Patient presents with   Follow-up  Doing very well  History of Present Illness:    Tara [name] is a 72 y.o. female past medical history significant for paroxysmal atrial fibrillation, she is anticoagulated, history of CVA, history of neurosarcoidosis, essential hypertension, dyslipidemia.  Comes today to my office for follow-up.  Overall doing very well.  There is no chest pain tightness squeezing pressure burning chest.  He does complain of having some shortness of breath.  Recently she was diagnosed with diabetes borderline and she was started with metformin she does have some difficulty tolerating it give her for some constipation.  Past Medical History:  Diagnosis Date   Sarcoidosis     Past Surgical History:  Procedure Laterality Date   APPLICATION OF CRANIAL NAVIGATION N/A 01/04/2018   Procedure: APPLICATION OF CRANIAL NAVIGATION;  Surgeon: Erline Levine, MD;  Location: [place] [facility];  Service: Neurosurgery;  Laterality: N/A;   BRAIN SURGERY     COLONOSCOPY WITH PROPOFOL N/A 11/20/2021   Procedure: COLONOSCOPY WITH PROPOFOL;  Surgeon: Rush Landmark Telford Nab., MD;  Location: [place];  Service: Gastroenterology;  Laterality: N/A;   HEMOSTASIS CLIP PLACEMENT  11/20/2021   Procedure: HEMOSTASIS CLIP PLACEMENT;  Surgeon: Irving Copas., MD;  Location: [place];  Service: Gastroenterology;;   POLYPECTOMY  11/20/2021   Procedure: POLYPECTOMY;  Surgeon: Irving Copas., MD;  Location: [place];  Service: Gastroenterology;;   PR DURAL GRAFT SPINAL Left 01/04/2018   Procedure: Left Pterional craniotomy for biopsy with Brainlab;  Surgeon: Erline Levine, MD;  Location: [place];  Service: Neurosurgery;  Laterality: Left;  Left Pterional  craniotomy for biopsy with Brainlab   SCLEROTHERAPY  11/20/2021   Procedure: SCLEROTHERAPY;  Surgeon: Irving Copas., MD;  Location: [place];  Service: Gastroenterology;;    Current Medications: Current Meds  Medication Sig   apixaban (ELIQUIS) 5 MG TABS tablet TAKE 1 TABLET BY MOUTH TWICE A DAY   aspirin EC 81 MG tablet Take 1 tablet (81 mg total) by mouth daily.   diltiazem (CARDIZEM CD) 360 MG 24 hr capsule Take 1 capsule (360 mg total) by mouth daily.   hydrochlorothiazide (HYDRODIURIL) 25 MG tablet Take 1 tablet (25 mg total) by mouth daily.   loratadine (CLARITIN) 10 MG tablet Take 1 tablet (10 mg total) by mouth daily as needed for allergies, rhinitis or itching.   metFORMIN (GLUCOPHAGE) 500 MG tablet Take 1 tablet (500 mg total) by mouth daily with breakfast.   Multiple Vitamin (MULTIVITAMIN WITH MINERALS) TABS tablet Take 1 tablet by mouth daily.   Multiple Vitamins-Minerals (PRESERVISION AREDS PO) Take 1 capsule by mouth in the morning and at bedtime. Unknown strenght   rosuvastatin (CRESTOR) 10 MG tablet Take 1 tablet (10 mg total) by mouth daily.   valsartan (DIOVAN) 40 MG tablet Take 1 tablet (40 mg total) by mouth daily.     Allergies:   Ace inhibitors and Ether   Social History   Socioeconomic History   Marital status: Married    Spouse name: Not on file   Number of children: Not on file   Years of education: Not on file   Highest education level: Not on file  Occupational History   Not on file  Tobacco Use   Smoking status: Former    Packs/day: 1.00    Types: Cigarettes    Quit date: 12/31/2017    Years since quitting: 4.8   Smokeless tobacco: Never  Vaping Use   Vaping Use: Never used  Substance and Sexual Activity   Alcohol use: No   Drug use: No   Sexual activity: Yes  Other Topics Concern   Not on file  Social History Narrative   Not on file   Social Determinants of Health   Financial Resource Strain: Low Risk  (08/03/2022)   Overall  Financial Resource Strain (CARDIA)    Difficulty of Paying Living Expenses: Not hard at all  Food Insecurity: No Food Insecurity (08/03/2022)   Hunger Vital Sign    Worried About Running Out of Food in the Last Year: Never true    Ran Out of Food in the Last Year: Never true  Transportation Needs: No Transportation Needs (08/03/2022)   PRAPARE - Hydrologist (Medical): No    Lack of Transportation (Non-Medical): No  Physical Activity: Sufficiently Active (08/03/2022)   Exercise Vital Sign    Days of Exercise per Week: 3 days    Minutes of Exercise per Session: 60 min  Stress: No Stress Concern Present (08/03/2022)   [place]    Feeling of Stress : Not at all  Social Connections: Moderately Integrated (08/03/2022)   Social Connection and Isolation Panel [NHANES]    Frequency of Communication with Friends and Family: More than three times a week    Frequency of Social Gatherings with Friends and Family: More than three times a week    Attends Religious Services: Never    Marine scientist or Organizations: Yes    Attends Music therapist: More than 4 times per year    Marital Status: Married     Family History: The patient's family history includes CAD in her father; Cancer in her father; Colon cancer in her mother; Diabetes in her brother, father, and sister; Heart disease in her father; Hypertension in her father. ROS:   Please see the history of present illness.    All 14 point review of systems negative except as described per history of present illness  EKGs/Labs/Other Studies Reviewed:      Recent Labs: 10/22/2022: ALT 14; BUN 26; Creatinine, Ser 0.77; Hemoglobin 13.2; Platelets 379.0; Potassium 3.7; Sodium 140  Recent Lipid Panel    Component Value Date/Time   CHOL 145 10/22/2022 1138   CHOL 226 (H) 12/10/2020 1019   TRIG 42.0 10/22/2022 1138   HDL 72.80  10/22/2022 1138   HDL 62 12/10/2020 1019   CHOLHDL 2 10/22/2022 1138   VLDL 8.4 10/22/2022 1138   LDLCALC 64 10/22/2022 1138   LDLCALC 147 (H) 12/10/2020 1019    Physical Exam:    VS:  BP 124/71 (BP Location: Left Arm, Patient Position: Sitting)   Pulse 71   Ht '5\' 6"'$  (1.676 m)   Wt 208 lb 1.3 oz (94.4 kg)   SpO2 98%   BMI 33.58 kg/m     Wt Readings from Last 3 Encounters:  11/03/22 208 lb 1.3 oz (94.4 kg)  10/22/22 206 lb (93.4 kg)  08/03/22 190 lb (86.2 kg)     GEN:  Well nourished, well developed in no acute distress HEENT: Normal NECK: No JVD; No carotid bruits LYMPHATICS: No lymphadenopathy CARDIAC: RRR, no murmurs, no rubs, no  gallops RESPIRATORY:  Clear to auscultation without rales, wheezing or rhonchi  ABDOMEN: Soft, non-tender, non-distended MUSCULOSKELETAL:  No edema; No deformity  SKIN: Warm and dry LOWER EXTREMITIES: no swelling NEUROLOGIC:  Alert and oriented x 3 PSYCHIATRIC:  Normal affect   ASSESSMENT:    1. Paroxysmal atrial fibrillation (HCC)   2. Neurosarcoidosis   3. Mixed hyperlipidemia   4. Seizures ([place])    PLAN:    In order of problems listed above:  Paroxysmal atrial fibrillation maintained sinus rhythm.  Ask him to have echocardiogram done to look at the size of the atrium.  Also will look at structurally heart make sure there is no pathologic there.  Will continue anticoagulation in the meantime.  She denies have any palpitations.  She also got an Apple Watch there is no alarm alarming her about atrial fibrillation. Neurosarcoidosis stable. Mixed dyslipidemia I did review K PN which show me LDL 57 HDL 42 however this is from year ago.  She is scheduled to see her primary care physician asked her to make sure she get fasting lipid profile done. Seizure disorder stable.  Followed by neurology   Medication Adjustments/Labs and Tests Ordered: Current medicines are reviewed at length with the patient today.  Concerns regarding medicines are  outlined above.  Orders Placed This Encounter  Procedures   EKG 12-Lead   Medication changes: No orders of the defined types were placed in this encounter.   Signed, Park Liter, MD, [facility] 11/03/2022 8:31 AM    [place]

## 2022-11-24 DIAGNOSIS — Z7901 Long term (current) use of anticoagulants: Secondary | ICD-10-CM | POA: Insufficient documentation

## 2022-11-24 DIAGNOSIS — Z961 Presence of intraocular lens: Secondary | ICD-10-CM | POA: Insufficient documentation

## 2022-11-24 DIAGNOSIS — H353133 Nonexudative age-related macular degeneration, bilateral, advanced atrophic without subfoveal involvement: Secondary | ICD-10-CM | POA: Insufficient documentation

## 2022-11-24 DIAGNOSIS — H43813 Vitreous degeneration, bilateral: Secondary | ICD-10-CM | POA: Insufficient documentation

## 2022-11-24 DIAGNOSIS — H353232 Exudative age-related macular degeneration, bilateral, with inactive choroidal neovascularization: Secondary | ICD-10-CM | POA: Insufficient documentation

## 2022-11-25 DIAGNOSIS — Z7901 Long term (current) use of anticoagulants: Secondary | ICD-10-CM | POA: Diagnosis not present

## 2022-11-25 DIAGNOSIS — H353133 Nonexudative age-related macular degeneration, bilateral, advanced atrophic without subfoveal involvement: Secondary | ICD-10-CM | POA: Diagnosis not present

## 2022-11-25 DIAGNOSIS — H43813 Vitreous degeneration, bilateral: Secondary | ICD-10-CM | POA: Diagnosis not present

## 2022-11-25 DIAGNOSIS — H353232 Exudative age-related macular degeneration, bilateral, with inactive choroidal neovascularization: Secondary | ICD-10-CM | POA: Diagnosis not present

## 2022-11-25 DIAGNOSIS — Z961 Presence of intraocular lens: Secondary | ICD-10-CM | POA: Diagnosis not present

## 2022-11-26 ENCOUNTER — Ambulatory Visit (HOSPITAL_COMMUNITY): Payer: Medicare Other | Attending: Cardiology

## 2022-11-26 DIAGNOSIS — R0609 Other forms of dyspnea: Secondary | ICD-10-CM | POA: Insufficient documentation

## 2022-11-26 LAB — ECHOCARDIOGRAM COMPLETE
Area-P 1/2: 2.6 cm2
S' Lateral: 2.7 cm

## 2022-12-04 ENCOUNTER — Other Ambulatory Visit: Payer: Self-pay | Admitting: Internal Medicine

## 2022-12-04 DIAGNOSIS — I10 Essential (primary) hypertension: Secondary | ICD-10-CM

## 2022-12-07 ENCOUNTER — Telehealth: Payer: Self-pay

## 2022-12-07 NOTE — Telephone Encounter (Signed)
Left message on My Chart with normal results per Dr. Krasowski's note 

## 2022-12-08 DIAGNOSIS — I4891 Unspecified atrial fibrillation: Secondary | ICD-10-CM | POA: Diagnosis not present

## 2022-12-08 DIAGNOSIS — R58 Hemorrhage, not elsewhere classified: Secondary | ICD-10-CM | POA: Diagnosis not present

## 2022-12-08 DIAGNOSIS — W19XXXA Unspecified fall, initial encounter: Secondary | ICD-10-CM | POA: Diagnosis not present

## 2022-12-08 DIAGNOSIS — Z23 Encounter for immunization: Secondary | ICD-10-CM | POA: Diagnosis not present

## 2022-12-08 DIAGNOSIS — S81811A Laceration without foreign body, right lower leg, initial encounter: Secondary | ICD-10-CM | POA: Diagnosis not present

## 2022-12-08 DIAGNOSIS — Z7901 Long term (current) use of anticoagulants: Secondary | ICD-10-CM | POA: Diagnosis not present

## 2022-12-09 ENCOUNTER — Encounter: Payer: Self-pay | Admitting: Internal Medicine

## 2022-12-09 ENCOUNTER — Ambulatory Visit (INDEPENDENT_AMBULATORY_CARE_PROVIDER_SITE_OTHER): Payer: Medicare Other | Admitting: Internal Medicine

## 2022-12-09 VITALS — BP 120/80 | HR 78 | Temp 97.6°F | Ht 66.0 in | Wt 207.0 lb

## 2022-12-09 DIAGNOSIS — R739 Hyperglycemia, unspecified: Secondary | ICD-10-CM | POA: Diagnosis not present

## 2022-12-09 NOTE — Progress Notes (Signed)
   Subjective:   Patient ID: Tara [name], female    DOB: 06-29-1951, 72 y.o.   MRN: [id number]  HPI The patient is a 72 YO female coming in for follow up starting metformin and to discuss weight. Fall yesterday and stitches at hospital she wants examined.  Review of Systems  Constitutional: Negative.   HENT: Negative.    Eyes: Negative.   Respiratory:  Negative for cough, chest tightness and shortness of breath.   Cardiovascular:  Negative for chest pain, palpitations and leg swelling.  Gastrointestinal:  Negative for abdominal distention, abdominal pain, constipation, diarrhea, nausea and vomiting.  Musculoskeletal: Negative.   Skin:  Positive for wound.  Neurological: Negative.   Psychiatric/Behavioral: Negative.      Objective:  Physical Exam Constitutional:      Appearance: She is well-developed.  HENT:     Head: Normocephalic and atraumatic.  Cardiovascular:     Rate and Rhythm: Normal rate and regular rhythm.  Pulmonary:     Effort: Pulmonary effort is normal. No respiratory distress.     Breath sounds: Normal breath sounds. No wheezing or rales.  Abdominal:     General: Bowel sounds are normal. There is no distension.     Palpations: Abdomen is soft.     Tenderness: There is no abdominal tenderness. There is no rebound.  Musculoskeletal:     Cervical back: Normal range of motion.  Skin:    General: Skin is warm and dry.     Comments: Half circle shaped wound with stitches right shin without signs of infection. Minimal oozing blood without obvious bleeding on exam.   Neurological:     Mental Status: She is alert and oriented to person, place, and time.     Coordination: Coordination normal.     Vitals:   12/09/22 1041  BP: 120/80  Pulse: 78  Temp: 97.6 F (36.4 C)  TempSrc: Oral  SpO2: 98%  Weight: 207 lb (93.9 kg)  Height: 5\' 6"  (1.676 m)    Assessment & Plan:  Visit time 18 minutes in face to face communication with patient and coordination of  care, additional 2 minutes spent in record review, coordination or care, ordering tests, communicating/referring to other healthcare professionals, documenting in medical records all on the same day of the visit for total time 20 minutes spent on the visit.

## 2022-12-10 ENCOUNTER — Encounter: Payer: Self-pay | Admitting: Internal Medicine

## 2022-12-10 NOTE — Assessment & Plan Note (Signed)
She has started metformin 500 mg daily for metabolic and weight loss benefit. No side effects and will continue for 6 months then reassess. We discussed other weight loss medications as well as the metabolic benefit seen with metformin in those with pre-diabetes.

## 2022-12-22 ENCOUNTER — Ambulatory Visit (INDEPENDENT_AMBULATORY_CARE_PROVIDER_SITE_OTHER): Payer: Medicare Other | Admitting: Internal Medicine

## 2022-12-22 ENCOUNTER — Encounter: Payer: Self-pay | Admitting: Internal Medicine

## 2022-12-22 VITALS — BP 112/60 | HR 71 | Temp 97.7°F | Ht 66.0 in | Wt 210.0 lb

## 2022-12-22 DIAGNOSIS — M79604 Pain in right leg: Secondary | ICD-10-CM | POA: Diagnosis not present

## 2022-12-22 NOTE — Assessment & Plan Note (Signed)
Sutures removed and number matches note from placing provider 9 stitches removed and tolerated well. No signs of infection some pain at the site and redness around wound. Healing well with granulation tissue intact. Counseled on wound care and expected timeline for recovery.

## 2022-12-22 NOTE — Progress Notes (Signed)
   Subjective:   Patient ID: Tara [name], female    DOB: August 24, 1951, 72 y.o.   MRN: [id number]  Suture / Staple Removal   The patient is a 72 YO female coming in for leg pain and suture removal.  Review of Systems  Constitutional: Negative.   HENT: Negative.    Eyes: Negative.   Respiratory:  Negative for cough, chest tightness and shortness of breath.   Cardiovascular:  Negative for chest pain, palpitations and leg swelling.  Gastrointestinal:  Negative for abdominal distention, abdominal pain, constipation, diarrhea, nausea and vomiting.  Musculoskeletal:  Positive for myalgias.  Skin:  Positive for wound.  Neurological: Negative.   Psychiatric/Behavioral: Negative.      Objective:  Physical Exam Constitutional:      Appearance: She is well-developed.  HENT:     Head: Normocephalic and atraumatic.  Cardiovascular:     Rate and Rhythm: Normal rate and regular rhythm.  Pulmonary:     Effort: Pulmonary effort is normal. No respiratory distress.     Breath sounds: Normal breath sounds. No wheezing or rales.  Abdominal:     General: Bowel sounds are normal. There is no distension.     Palpations: Abdomen is soft.     Tenderness: There is no abdominal tenderness. There is no rebound.  Musculoskeletal:        General: Tenderness present.     Cervical back: Normal range of motion.  Skin:    General: Skin is warm and dry.     Comments: Right shin with wound, 9 blue sutures removed. No signs of infection some redness around the wound. Healing well with granulation tissue intact  Neurological:     Mental Status: She is alert and oriented to person, place, and time.     Coordination: Coordination normal.     Vitals:   12/22/22 1345  BP: 112/60  Pulse: 71  Temp: 97.7 F (36.5 C)  TempSrc: Oral  SpO2: 98%  Weight: 210 lb (95.3 kg)  Height: 5' 6"$  (1.676 m)    Assessment & Plan:  Visit time 15 minutes in face to face communication with patient and coordination of  care, additional 5 minutes spent in record review, coordination or care, ordering tests, communicating/referring to other healthcare professionals, documenting in medical records all on the same day of the visit for total time 20 minutes spent on the visit.

## 2023-01-07 ENCOUNTER — Other Ambulatory Visit: Payer: Self-pay | Admitting: Internal Medicine

## 2023-01-07 DIAGNOSIS — I2510 Atherosclerotic heart disease of native coronary artery without angina pectoris: Secondary | ICD-10-CM

## 2023-01-20 ENCOUNTER — Other Ambulatory Visit: Payer: Self-pay | Admitting: Nurse Practitioner

## 2023-01-20 DIAGNOSIS — R7303 Prediabetes: Secondary | ICD-10-CM

## 2023-01-20 DIAGNOSIS — E669 Obesity, unspecified: Secondary | ICD-10-CM

## 2023-02-03 ENCOUNTER — Ambulatory Visit
Admission: RE | Admit: 2023-02-03 | Discharge: 2023-02-03 | Disposition: A | Discharge status: Home / Self Care | Payer: Medicare Other | Source: Ambulatory Visit | Attending: Internal Medicine | Admitting: Internal Medicine

## 2023-02-03 ENCOUNTER — Encounter: Payer: Self-pay | Admitting: Internal Medicine

## 2023-02-03 DIAGNOSIS — M810 Age-related osteoporosis without current pathological fracture: Secondary | ICD-10-CM | POA: Diagnosis not present

## 2023-02-03 DIAGNOSIS — Z1231 Encounter for screening mammogram for malignant neoplasm of breast: Secondary | ICD-10-CM | POA: Diagnosis not present

## 2023-02-03 DIAGNOSIS — Z780 Asymptomatic menopausal state: Secondary | ICD-10-CM | POA: Diagnosis not present

## 2023-02-04 LAB — HM MAMMOGRAPHY

## 2023-02-04 MED ORDER — ALENDRONATE SODIUM 70 MG PO TABS: 70.0000 mg | ORAL_TABLET | ORAL | 11 refills | Status: DC

## 2023-02-11 ENCOUNTER — Other Ambulatory Visit: Payer: Self-pay | Admitting: Cardiology

## 2023-02-11 NOTE — Telephone Encounter (Signed)
Rx to pharmacy

## 2023-03-31 ENCOUNTER — Ambulatory Visit (HOSPITAL_COMMUNITY)
Admission: RE | Admit: 2023-03-31 | Discharge: 2023-03-31 | Disposition: A | Discharge status: Home / Self Care | Payer: Medicare Other | Source: Ambulatory Visit | Attending: Internal Medicine | Admitting: Internal Medicine

## 2023-03-31 DIAGNOSIS — D8689 Sarcoidosis of other sites: Secondary | ICD-10-CM | POA: Insufficient documentation

## 2023-03-31 DIAGNOSIS — G9389 Other specified disorders of brain: Secondary | ICD-10-CM | POA: Diagnosis not present

## 2023-03-31 MED ORDER — GADOBUTROL 1 MMOL/ML IV SOLN
9.5000 mL | Freq: Once | INTRAVENOUS | Status: AC | PRN
  Administered 2023-03-31: 9.5 mL via INTRAVENOUS

## 2023-04-02 DIAGNOSIS — H353133 Nonexudative age-related macular degeneration, bilateral, advanced atrophic without subfoveal involvement: Secondary | ICD-10-CM | POA: Diagnosis not present

## 2023-04-02 DIAGNOSIS — Z961 Presence of intraocular lens: Secondary | ICD-10-CM | POA: Diagnosis not present

## 2023-04-06 ENCOUNTER — Inpatient Hospital Stay: Payer: Medicare Other | Attending: Internal Medicine | Admitting: Internal Medicine

## 2023-04-06 VITALS — BP 113/71 | HR 68 | Temp 97.5°F | Resp 18 | Ht 66.0 in | Wt 204.4 lb

## 2023-04-06 DIAGNOSIS — Z87891 Personal history of nicotine dependence: Secondary | ICD-10-CM | POA: Diagnosis not present

## 2023-04-06 DIAGNOSIS — Z800 Family history of malignant neoplasm of digestive organs: Secondary | ICD-10-CM | POA: Diagnosis not present

## 2023-04-06 DIAGNOSIS — R569 Unspecified convulsions: Secondary | ICD-10-CM

## 2023-04-06 DIAGNOSIS — D8689 Sarcoidosis of other sites: Secondary | ICD-10-CM | POA: Diagnosis not present

## 2023-04-06 DIAGNOSIS — G9389 Other specified disorders of brain: Secondary | ICD-10-CM | POA: Insufficient documentation

## 2023-04-06 NOTE — Progress Notes (Signed)
[facility] at [facility] [address]  [place], [place] [address] [phone]   Interval Evaluation  Date of Service: 04/06/23 Patient Name: Tara [name] Patient MRN: [id number] Patient DOB: 24-Mar-1951 Provider: Henreitta Leber, MD  Identifying Statement:  Tara [name] is a 72 y.o. female with left frontal  brain lesion , transverse myelitis  Interval History:  Tara [name] presents today for follow up after recent MRI brain.  Denies new or progressive neurologic deficits.  She remains active and independent.  Continues with baseline mild word finding/expression difficulty.  No recent headaches.   Medications: Current Outpatient Medications on File Prior to Visit  Medication Sig Dispense Refill   alendronate (FOSAMAX) 70 MG tablet Take 1 tablet (70 mg total) by mouth every 7 (seven) days. Take with a full glass of water on an empty stomach. 4 tablet 11   apixaban (ELIQUIS) 5 MG TABS tablet TAKE 1 TABLET BY MOUTH TWICE A DAY 180 tablet 1   aspirin EC 81 MG tablet Take 1 tablet (81 mg total) by mouth daily.     diltiazem (CARDIZEM CD) 360 MG 24 hr capsule TAKE 1 CAPSULE BY MOUTH DAILY 90 capsule 1   hydrochlorothiazide (HYDRODIURIL) 25 MG tablet TAKE ONE TABLET BY MOUTH DAILY 90 tablet 3   loratadine (CLARITIN) 10 MG tablet Take 1 tablet (10 mg total) by mouth daily as needed for allergies, rhinitis or itching. 30 tablet 0   metFORMIN (GLUCOPHAGE) 500 MG tablet TAKE 1 TABLET BY MOUTH DAILY WITH BREAKFAST 90 tablet 1   Multiple Vitamin (MULTIVITAMIN WITH MINERALS) TABS tablet Take 1 tablet by mouth daily.     Multiple Vitamins-Minerals (PRESERVISION AREDS PO) Take 1 capsule by mouth in the morning and at bedtime. Unknown strenght     rosuvastatin (CRESTOR) 10 MG tablet TAKE ONE TABLET BY MOUTH DAILY 90 tablet 3   valsartan (DIOVAN) 40 MG tablet Take 1 tablet (40 mg total) by mouth daily. 90 tablet 3   No current facility-administered  medications on file prior to visit.    Allergies:  Allergies  Allergen Reactions   Ace Inhibitors Cough   Ether Other (See Comments)    unknown   Past Medical History:  Past Medical History:  Diagnosis Date   Sarcoidosis    Past Surgical History:  Past Surgical History:  Procedure Laterality Date   APPLICATION OF CRANIAL NAVIGATION N/A 01/04/2018   Procedure: APPLICATION OF CRANIAL NAVIGATION;  Surgeon: Maeola Harman, MD;  Location[facility] OR;  Service: Neurosurgery;  Laterality: N/A;   BRAIN SURGERY     COLONOSCOPY WITH PROPOFOL N/A 11/20/2021   Procedure: COLONOSCOPY WITH PROPOFOL;  Surgeon: Meridee Score Netty Starring., MD;  Location: [facility] ENDOSCOPY;  Service: Gastroenterology;  Laterality: N/A;   HEMOSTASIS CLIP PLACEMENT  11/20/2021   Procedure: HEMOSTASIS CLIP PLACEMENT;  Surgeon: Lemar Lofty., MD;  Location: [facility] ENDOSCOPY;  Service: Gastroenterology;;   POLYPECTOMY  11/20/2021   Procedure: POLYPECTOMY;  Surgeon: Lemar Lofty., MD;  Location: [facility] ENDOSCOPY;  Service: Gastroenterology;;   PR DURAL GRAFT SPINAL Left 01/04/2018   Procedure: Left Pterional craniotomy for biopsy with Brainlab;  Surgeon: Maeola Harman, MD;  Location: [facility] OR;  Service: Neurosurgery;  Laterality: Left;  Left Pterional craniotomy for biopsy with Brainlab   SCLEROTHERAPY  11/20/2021   Procedure: SCLEROTHERAPY;  Surgeon: Mansouraty, Netty Starring., MD;  Location: [facility] ENDOSCOPY;  Service: Gastroenterology;;   Social History:  Social History   Socioeconomic History   Marital status:  Married    Spouse name: Not on file   Number of children: Not on file   Years of education: Not on file   Highest education level: Not on file  Occupational History   Not on file  Tobacco Use   Smoking status: Former    Packs/day: 1    Types: Cigarettes    Quit date: 12/31/2017    Years since quitting: 5.2   Smokeless tobacco: Never  Vaping Use   Vaping Use: Never used  Substance and Sexual Activity   Alcohol use:  No   Drug use: No   Sexual activity: Yes  Other Topics Concern   Not on file  Social History Narrative   Not on file   Social Determinants of Health   Financial Resource Strain: Low Risk  (08/03/2022)   Overall Financial Resource Strain (CARDIA)    Difficulty of Paying Living Expenses: Not hard at all  Food Insecurity: No Food Insecurity (08/03/2022)   Hunger Vital Sign    Worried About Running Out of Food in the Last Year: Never true    Ran Out of Food in the Last Year: Never true  Transportation Needs: No Transportation Needs (08/03/2022)   PRAPARE - Administrator, Civil Service (Medical): No    Lack of Transportation (Non-Medical): No  Physical Activity: Sufficiently Active (08/03/2022)   Exercise Vital Sign    Days of Exercise per Week: 3 days    Minutes of Exercise per Session: 60 min  Stress: No Stress Concern Present (08/03/2022)   [employer] of Occupational Health - Occupational Stress Questionnaire    Feeling of Stress : Not at all  Social Connections: Moderately Integrated (08/03/2022)   Social Connection and Isolation Panel [NHANES]    Frequency of Communication with Friends and Family: More than three times a week    Frequency of Social Gatherings with Friends and Family: More than three times a week    Attends Religious Services: Never    Database administrator or Organizations: Yes    Attends Engineer, structural: More than 4 times per year    Marital Status: Married  Catering manager Violence: Not At Risk (08/03/2022)   Humiliation, Afraid, Rape, and Kick questionnaire    Fear of Current or Ex-Partner: No    Emotionally Abused: No    Physically Abused: No    Sexually Abused: No   Family History:  Family History  Problem Relation Age of Onset   Colon cancer Mother    Colon cancer Father    CAD Father    Cancer Father    Diabetes Father    Heart disease Father    Hypertension Father    Diabetes Sister    Diabetes Brother      Review of Systems: Constitutional: Denies fevers, chills or abnormal weight loss Eyes: Denies blurriness of vision Ears, nose, mouth, throat, and face: +hair loss Respiratory: Denies cough, dyspnea or wheezes Cardiovascular: Denies palpitation, chest discomfort or lower extremity swelling Gastrointestinal:  Denies nausea, constipation, diarrhea GU: Denies dysuria or incontinence Skin: Denies abnormal skin rashes Neurological: Per HPI Musculoskeletal: Denies joint pain, back or neck discomfort. No decrease in ROM Behavioral/Psych: +anxiety  Physical Exam: There were no vitals filed for this visit.  KPS: 90. General: Alert, cooperative, pleasant, in no acute distress Head: Normal EENT: No conjunctival injection or scleral icterus. Oral mucosa moist Lungs: Resp effort normal Cardiac: Regular rate and rhythm Abdomen: Soft, non-distended abdomen Skin: No rashes  cyanosis or petechiae. Extremities: no edema  Neurologic Exam: Mental Status: Awake, alert, attentive to examiner. Oriented to self and environment. Language is fluent with intact comprehension.  Cranial Nerves: Visual acuity is grossly normal. Visual fields are full. Extra-ocular movements intact. No ptosis. Face is symmetric, tongue midline. Motor: Tone and bulk are normal. Power is full in both arms and legs. Reflexes are symmetric, no pathologic reflexes present. Intact finger to nose bilaterally Sensory: Intact to light touch and temperature Gait: Normal and tandem gait is deferred   Labs: I have reviewed the data as listed    Component Value Date/Time   NA 140 10/22/2022 1138   NA 139 12/10/2020 1019   K 3.7 10/22/2022 1138   CL 102 10/22/2022 1138   CO2 31 10/22/2022 1138   GLUCOSE 92 10/22/2022 1138   BUN 26 (H) 10/22/2022 1138   BUN 20 12/10/2020 1019   CREATININE 0.77 10/22/2022 1138   CALCIUM 9.5 10/22/2022 1138   PROT 7.3 10/22/2022 1138   PROT 6.9 12/10/2020 1019   ALBUMIN 4.4 10/22/2022 1138    ALBUMIN 4.5 12/10/2020 1019   AST 15 10/22/2022 1138   ALT 14 10/22/2022 1138   ALKPHOS 110 10/22/2022 1138   BILITOT 0.6 10/22/2022 1138   BILITOT 0.4 12/10/2020 1019   GFRNONAA 98 12/10/2020 1019   GFRNONAA >60 10/08/2020 0322   GFRAA 113 12/10/2020 1019   Lab Results  Component Value Date   WBC 4.7 10/22/2022   NEUTROABS 6.3 10/07/2020   HGB 13.2 10/22/2022   HCT 39.1 10/22/2022   MCV 94.6 10/22/2022   PLT 379.0 10/22/2022    Imaging:  [facility] Clinician Interpretation: I have personally reviewed the CNS images as listed.  My interpretation, in the context of the patient's clinical presentation, is stable disease, pending official read  No results found.    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  No new or progressive changes.  Underlying etiology is likely inflammatory rather than neoplastic, and have greatest suspicion for neurosarcoidosis.    She will con't to follow with neuro-immunologist Dr. Sherryll Burger at [facility].    No further steroids or immunomodulation at this time.  We ask that Shea Stakes return to clinic as needed with new or progressive neurologic symptoms..   We appreciate the opportunity to participate in the care of Tara [name].   All questions were answered. The patient knows to call the clinic with any problems, questions or concerns. No barriers to learning were detected.  The total time spent in the encounter was 30 minutes and more than 50% was on counseling and review of test results   Henreitta Leber, MD Medical Director of Neuro-Oncology [facility] 04/06/23 8:59 AM

## 2023-04-10 ENCOUNTER — Other Ambulatory Visit: Payer: Self-pay | Admitting: Cardiology

## 2023-04-10 DIAGNOSIS — I480 Paroxysmal atrial fibrillation: Secondary | ICD-10-CM

## 2023-04-12 NOTE — Telephone Encounter (Signed)
Prescription refill request for Eliquis received. Indication: PAF Last office visit: 11/03/22  Kandyce Rud MD Scr: 0.77 on 10/22/22 Age: 72 Weight: 94.4kg  Based on above findings Eliquis 5mg  twice daily is the appropriate dose.  Refill approved.

## 2023-05-30 ENCOUNTER — Other Ambulatory Visit: Payer: Self-pay | Admitting: Internal Medicine

## 2023-07-22 ENCOUNTER — Other Ambulatory Visit: Payer: Self-pay

## 2023-07-22 ENCOUNTER — Telehealth: Payer: Self-pay | Admitting: Internal Medicine

## 2023-07-22 DIAGNOSIS — R7303 Prediabetes: Secondary | ICD-10-CM

## 2023-07-22 DIAGNOSIS — E669 Obesity, unspecified: Secondary | ICD-10-CM

## 2023-07-22 MED ORDER — METFORMIN HCL 500 MG PO TABS: 500.0000 mg | ORAL_TABLET | Freq: Every day | ORAL | 1 refills | Status: DC

## 2023-07-22 NOTE — Telephone Encounter (Signed)
Prescription Request  07/22/2023  LOV: 12/22/2022  What is the name of the medication or equipment?  metFORMIN (GLUCOPHAGE) 500 MG tablet    Have you contacted your pharmacy to request a refill? No   Which pharmacy would you like this sent to?  [facility] [id number] [place], [place] - [address] [facility] [address] [place] [address] Phone: [phone] Fax: [phone]    Patient notified that their request is being sent to the clinical staff for review and that they should receive a response within 2 business days.   Please advise at Mobile [phone] (mobile)

## 2023-07-22 NOTE — Telephone Encounter (Signed)
Done

## 2023-08-06 ENCOUNTER — Ambulatory Visit (INDEPENDENT_AMBULATORY_CARE_PROVIDER_SITE_OTHER): Payer: Medicare Other

## 2023-08-06 VITALS — Ht 66.0 in | Wt 204.0 lb

## 2023-08-06 DIAGNOSIS — Z0000 Encounter for general adult medical examination without abnormal findings: Secondary | ICD-10-CM

## 2023-08-06 NOTE — Patient Instructions (Signed)
Tara [name] , Thank you for taking time to come for your Medicare Wellness Visit. I appreciate your ongoing commitment to your health goals. Please review the following plan we discussed and let me know if I can assist you in the future.   Referrals/Orders/Follow-Ups/Clinician Recommendations: You are due for a Flu, Covid, Shingles and a Pneumonia vaccines.  It was nice talking to you today.  This is a list of the screening recommended for you and due dates:  Health Maintenance  Topic Date Due   Zoster (Shingles) Vaccine (1 of 2) Never done   Flu Shot  06/03/2023   COVID-19 Vaccine (3 - 2023-24 season) 07/04/2023   Pneumonia Vaccine (2 of 2 - PCV) 10/23/2023*   Medicare Annual Wellness Visit  08/05/2024   Colon Cancer Screening  11/20/2024   Mammogram  02/03/2025   DTaP/Tdap/Td vaccine (3 - Td or Tdap) 12/08/2032   DEXA scan (bone density measurement)  Completed   Hepatitis C Screening  Completed   HPV Vaccine  Aged Out   Cologuard (Stool DNA test)  Discontinued  *Topic was postponed. The date shown is not the original due date.    Advanced directives: (Copy Requested) Please bring a copy of your health care power of attorney and living will to the office to be added to your chart at your convenience.  Next Medicare Annual Wellness Visit scheduled for next year: Yes

## 2023-08-06 NOTE — Progress Notes (Signed)
Subjective:   Tara [name] is a 72 y.o. female who presents for Medicare Annual (Subsequent) preventive examination.  Visit Complete: Virtual  I connected with  Shea Stakes on 08/06/23 by a audio enabled telemedicine application and verified that I am speaking with the correct person using two identifiers.  Patient Location: Home  Provider Location: Office/Clinic  I discussed the limitations of evaluation and management by telemedicine. The patient expressed understanding and agreed to proceed.  Because this visit was a virtual/telehealth visit, some criteria may be missing or patient reported. Any vitals not documented were not able to be obtained and vitals that have been documented are patient reported.    Cardiac Risk Factors include: advanced age (>40men, >27 women);hypertension;Other (see comment);dyslipidemia, Risk factor comments: A-Fib     Objective:    Today's Vitals   08/06/23 1515  Weight: 204 lb (92.5 kg)  Height: 5\' 6"  (1.676 m)   Body mass index is 32.93 kg/m.     08/06/2023    3:28 PM 08/03/2022    8:25 AM 04/07/2022    9:13 AM 11/20/2021   10:08 AM 10/07/2021   12:16 PM 07/28/2021    5:45 PM 10/08/2020   12:00 AM  Advanced Directives  Does Patient Have a Medical Advance Directive? Yes Yes Yes Yes No Yes No  Type of Estate agent of [place];Living will Healthcare Power of [place];Living will Healthcare Power of [place];Living will Healthcare Power of [place];Living will     Does patient want to make changes to medical advance directive?      Yes (Inpatient - patient defers changing a medical advance directive and declines information at this time)   Copy of Healthcare Power of Attorney in Chart? No - copy requested No - copy requested No - copy requested No - copy requested     Would patient like information on creating a medical advance directive?     No - Patient declined  No - Patient declined    Current Medications  (verified) Outpatient Encounter Medications as of 08/06/2023  Medication Sig   alendronate (FOSAMAX) 70 MG tablet Take 1 tablet (70 mg total) by mouth every 7 (seven) days. Take with a full glass of water on an empty stomach.   aspirin EC 81 MG tablet Take 1 tablet (81 mg total) by mouth daily.   diltiazem (CARDIZEM CD) 360 MG 24 hr capsule TAKE 1 CAPSULE BY MOUTH DAILY   ELIQUIS 5 MG TABS tablet TAKE 1 TABLET BY MOUTH TWICE A DAY   hydrochlorothiazide (HYDRODIURIL) 25 MG tablet TAKE ONE TABLET BY MOUTH DAILY   loratadine (CLARITIN) 10 MG tablet Take 1 tablet (10 mg total) by mouth daily as needed for allergies, rhinitis or itching.   metFORMIN (GLUCOPHAGE) 500 MG tablet Take 1 tablet (500 mg total) by mouth daily with breakfast.   Multiple Vitamin (MULTIVITAMIN WITH MINERALS) TABS tablet Take 1 tablet by mouth daily.   Multiple Vitamins-Minerals (PRESERVISION AREDS PO) Take 1 capsule by mouth in the morning and at bedtime. Unknown strenght   rosuvastatin (CRESTOR) 10 MG tablet TAKE ONE TABLET BY MOUTH DAILY   valsartan (DIOVAN) 40 MG tablet TAKE 1 TABLET BY MOUTH DAILY   No facility-administered encounter medications on file as of 08/06/2023.    Allergies (verified) Ace inhibitors and Ether   History: Past Medical History:  Diagnosis Date   Sarcoidosis    Past Surgical History:  Procedure Laterality Date   APPLICATION OF CRANIAL NAVIGATION N/A 01/04/2018  Procedure: APPLICATION OF CRANIAL NAVIGATION;  Surgeon: Maeola Harman, MD;  Location: [facility] OR;  Service: Neurosurgery;  Laterality: N/A;   BRAIN SURGERY     COLONOSCOPY WITH PROPOFOL N/A 11/20/2021   Procedure: COLONOSCOPY WITH PROPOFOL;  Surgeon: Meridee Score Netty Starring., MD;  Location: [facility] ENDOSCOPY;  Service: Gastroenterology;  Laterality: N/A;   HEMOSTASIS CLIP PLACEMENT  11/20/2021   Procedure: HEMOSTASIS CLIP PLACEMENT;  Surgeon: Lemar Lofty., MD;  Location: [facility] ENDOSCOPY;  Service: Gastroenterology;;   POLYPECTOMY   11/20/2021   Procedure: POLYPECTOMY;  Surgeon: Lemar Lofty., MD;  Location: [facility] ENDOSCOPY;  Service: Gastroenterology;;   PR DURAL GRAFT SPINAL Left 01/04/2018   Procedure: Left Pterional craniotomy for biopsy with Brainlab;  Surgeon: Maeola Harman, MD;  Location: [facility] OR;  Service: Neurosurgery;  Laterality: Left;  Left Pterional craniotomy for biopsy with Brainlab   SCLEROTHERAPY  11/20/2021   Procedure: SCLEROTHERAPY;  Surgeon: Mansouraty, Netty Starring., MD;  Location: [facility] ENDOSCOPY;  Service: Gastroenterology;;   Family History  Problem Relation Age of Onset   Colon cancer Mother    Colon cancer Father    CAD Father    Cancer Father    Diabetes Father    Heart disease Father    Hypertension Father    Diabetes Sister    Diabetes Brother    Social History   Socioeconomic History   Marital status: Married    Spouse name: Hinton Dyer   Number of children: 4   Years of education: Not on file   Highest education level: Not on file  Occupational History   Occupation: Retired  Tobacco Use   Smoking status: Former    Current packs/day: 0.00    Types: Cigarettes    Quit date: 12/31/2017    Years since quitting: 5.6   Smokeless tobacco: Never  Vaping Use   Vaping status: Never Used  Substance and Sexual Activity   Alcohol use: No   Drug use: No   Sexual activity: Yes  Other Topics Concern   Not on file  Social History Narrative   Lives with husband.  2 dogs.  4 children and 10 grandchildren.    Social Determinants of Health   Financial Resource Strain: Low Risk  (08/03/2022)   Overall Financial Resource Strain (CARDIA)    Difficulty of Paying Living Expenses: Not hard at all  Food Insecurity: No Food Insecurity (08/03/2022)   Hunger Vital Sign    Worried About Running Out of Food in the Last Year: Never true    Ran Out of Food in the Last Year: Never true  Transportation Needs: No Transportation Needs (08/06/2023)   PRAPARE - Administrator, Civil Service  (Medical): No    Lack of Transportation (Non-Medical): No  Physical Activity: Sufficiently Active (08/06/2023)   Exercise Vital Sign    Days of Exercise per Week: 4 days    Minutes of Exercise per Session: 50 min  Stress: No Stress Concern Present (08/06/2023)   [employer] of Occupational Health - Occupational Stress Questionnaire    Feeling of Stress : Not at all  Social Connections: Moderately Isolated (08/06/2023)   Social Connection and Isolation Panel [NHANES]    Frequency of Communication with Friends and Family: More than three times a week    Frequency of Social Gatherings with Friends and Family: More than three times a week    Attends Religious Services: Never    Database administrator or Organizations: No    Attends Banker  Meetings: Never    Marital Status: Married    Tobacco Counseling Counseling given: Not Answered   Clinical Intake:  Pre-visit preparation completed: Yes  Pain : No/denies pain     BMI - recorded: 32.93 Nutritional Status: BMI > 30  Obese Nutritional Risks: None Diabetes: No  How often do you need to have someone help you when you read instructions, pamphlets, or other written materials from your doctor or pharmacy?: 1 - Never  Interpreter Needed?: No  Information entered by :: Jazzalynn Rhudy, RMA   Activities of Daily Living    08/06/2023    3:19 PM  In your present state of health, do you have any difficulty performing the following activities:  Hearing? 0  Vision? 0  Difficulty concentrating or making decisions? 0  Walking or climbing stairs? 0  Dressing or bathing? 0  Doing errands, shopping? 0  Preparing Food and eating ? N  Using the Toilet? N  Managing your Medications? N  Managing your Finances? N  Housekeeping or managing your Housekeeping? N    Patient Care Team: Myrlene Broker, MD as PCP - General (Internal Medicine) Georgeanna Lea, MD as PCP - Cardiology (Cardiology)  Indicate any  recent Medical Services you may have received from other than [facility] providers in the past year (date may be approximate).     Assessment:   This is a routine wellness examination for Wajiha.  Hearing/Vision screen Hearing Screening - Comments:: Denies hearing difficulties   Vision Screening - Comments:: Wears eyeglasses.   Goals Addressed               This Visit's Progress     Patient Stated (pt-stated)        Would like to keep going.      Depression Screen    08/06/2023    3:39 PM 12/22/2022    1:48 PM 12/09/2022   10:45 AM 10/22/2022   10:48 AM 08/03/2022    8:24 AM 07/28/2021    5:38 PM 04/18/2021    9:02 AM  PHQ 2/9 Scores  PHQ - 2 Score 1 0 0 0 0 1 0  PHQ- 9 Score 2 0 0 0       Fall Risk    08/06/2023    3:29 PM 12/22/2022    1:48 PM 12/09/2022   10:45 AM 10/22/2022   10:48 AM 08/03/2022    8:21 AM  Fall Risk   Falls in the past year? 0 0 1 0 0  Number falls in past yr:  0 0 0 0  Injury with Fall?  0 1 0 0  Risk for fall due to :     No Fall Risks  Follow up Falls evaluation completed;Falls prevention discussed Falls evaluation completed  Falls evaluation completed Falls prevention discussed    MEDICARE RISK AT HOME: Medicare Risk at Home Any stairs in or around the home?: Yes If so, are there any without handrails?: Yes Home free of loose throw rugs in walkways, pet beds, electrical cords, etc?: Yes Adequate lighting in your home to reduce risk of falls?: Yes Life alert?: No Use of a cane, walker or w/c?: No Grab bars in the bathroom?: Yes Shower chair or bench in shower?: Yes Elevated toilet seat or a handicapped toilet?: Yes  TIMED UP AND GO:  Was the test performed?  No    Cognitive Function:        08/06/2023    3:32 PM 08/03/2022  8:26 AM 07/28/2021    6:01 PM 12/01/2019   11:06 AM  6CIT Screen  What Year? 0 points 0 points 0 points 0 points  What month? 0 points 0 points  0 points  What time? 0 points 0 points 0 points 0 points  Count  back from 20 0 points 0 points 0 points 0 points  Months in reverse 0 points 0 points 0 points 0 points  Repeat phrase 0 points 0 points 0 points 0 points  Total Score 0 points 0 points  0 points    Immunizations Immunization History  Administered Date(s) Administered   Fluad Quad(high Dose 65+) 07/31/2019, 10/22/2022   Influenza, High Dose Seasonal PF 01/04/2018, 08/09/2018   Influenza,inj,Quad PF,6+ Mos 10/10/2020   Influenza-Unspecified 09/05/2021   PFIZER(Purple Top)SARS-COV-2 Vaccination 11/23/2019, 12/14/2019   Pneumococcal Polysaccharide-23 01/04/2018   Tdap 03/14/2018, 12/08/2022    TDAP status: Up to date  Flu Vaccine status: Due, Education has been provided regarding the importance of this vaccine. Advised may receive this vaccine at local pharmacy or Health Dept. Aware to provide a copy of the vaccination record if obtained from local pharmacy or Health Dept. Verbalized acceptance and understanding.  Pneumococcal vaccine status: Due, Education has been provided regarding the importance of this vaccine. Advised may receive this vaccine at local pharmacy or Health Dept. Aware to provide a copy of the vaccination record if obtained from local pharmacy or Health Dept. Verbalized acceptance and understanding.  Covid-19 vaccine status: Information provided on how to obtain vaccines.   Qualifies for Shingles Vaccine? Yes   Zostavax completed No   Shingrix Completed?: No.    Education has been provided regarding the importance of this vaccine. Patient has been advised to call insurance company to determine out of pocket expense if they have not yet received this vaccine. Advised may also receive vaccine at local pharmacy or Health Dept. Verbalized acceptance and understanding.  Screening Tests Health Maintenance  Topic Date Due   Zoster Vaccines- Shingrix (1 of 2) Never done   INFLUENZA VACCINE  06/03/2023   COVID-19 Vaccine (3 - 2023-24 season) 07/04/2023   Pneumonia Vaccine  34+ Years old (2 of 2 - PCV) 10/23/2023 (Originally 01/05/2019)   Medicare Annual Wellness (AWV)  08/05/2024   Colonoscopy  11/20/2024   MAMMOGRAM  02/03/2025   DTaP/Tdap/Td (3 - Td or Tdap) 12/08/2032   DEXA SCAN  Completed   Hepatitis C Screening  Completed   HPV VACCINES  Aged Out   Fecal DNA (Cologuard)  Discontinued    Health Maintenance  Health Maintenance Due  Topic Date Due   Zoster Vaccines- Shingrix (1 of 2) Never done   INFLUENZA VACCINE  06/03/2023   COVID-19 Vaccine (3 - 2023-24 season) 07/04/2023    Colorectal cancer screening: Type of screening: Cologuard. Completed 11/20/2021. Repeat every 3 years  Mammogram status: Completed 02/04/2023. Repeat every year  Bone Density status: Completed 02/03/2023. Results reflect: Bone density results: OSTEOPENIA. Repeat every 2 years.  Lung Cancer Screening: (Low Dose CT Chest recommended if Age 55-80 years, 20 pack-year currently smoking OR have quit w/in 15years.) does not qualify.   Lung Cancer Screening Referral: N/A  Additional Screening:  Hepatitis C Screening: does qualify; Completed 03/14/2018  Vision Screening: Recommended annual ophthalmology exams for early detection of glaucoma and other disorders of the eye. Is the patient up to date with their annual eye exam?  Yes  Who is the provider or what is the name of the office in which  the patient attends annual eye exams? Dr. Dione Booze If pt is not established with a provider, would they like to be referred to a provider to establish care? No .   Dental Screening: Recommended annual dental exams for proper oral hygiene  Community Resource Referral / Chronic Care Management: CRR required this visit?  No   CCM required this visit?  No     Plan:     I have personally reviewed and noted the following in the patient's chart:   Medical and social history Use of alcohol, tobacco or illicit drugs  Current medications and supplements including opioid prescriptions.  Patient is not currently taking opioid prescriptions. Functional ability and status Nutritional status Physical activity Advanced directives List of other physicians Hospitalizations, surgeries, and ER visits in previous 12 months Vitals Screenings to include cognitive, depression, and falls Referrals and appointments  In addition, I have reviewed and discussed with patient certain preventive protocols, quality metrics, and best practice recommendations. A written personalized care plan for preventive services as well as general preventive health recommendations were provided to patient.     Eleaner Dibartolo L Jeffie Spivack, CMA   08/06/2023   After Visit Summary: (MyChart) Due to this being a telephonic visit, the after visit summary with patients personalized plan was offered to patient via MyChart   Nurse Notes: Patient is due for a Flu, Shingrix and Covid vaccine.  She is also due for another Pneumonia vaccine, as she has had PPSV23 IN 2019.  Patient had no other concerns to address today.

## 2023-08-10 ENCOUNTER — Other Ambulatory Visit: Payer: Self-pay | Admitting: Cardiology

## 2023-08-10 NOTE — Telephone Encounter (Signed)
Rx refill sent to pharmacy. 

## 2023-09-16 DIAGNOSIS — H353132 Nonexudative age-related macular degeneration, bilateral, intermediate dry stage: Secondary | ICD-10-CM | POA: Diagnosis not present

## 2023-09-16 DIAGNOSIS — H5347 Heteronymous bilateral field defects: Secondary | ICD-10-CM | POA: Diagnosis not present

## 2023-09-16 DIAGNOSIS — Z961 Presence of intraocular lens: Secondary | ICD-10-CM | POA: Diagnosis not present

## 2023-10-07 ENCOUNTER — Other Ambulatory Visit: Payer: Self-pay | Admitting: Cardiology

## 2023-10-07 DIAGNOSIS — I480 Paroxysmal atrial fibrillation: Secondary | ICD-10-CM

## 2023-10-07 NOTE — Telephone Encounter (Signed)
Prescription refill request for Eliquis received.  Indication: afib  Last office visit: 11/03/2022, Bing Matter Scr: 0.77, 10/22/2022 Age: 72 Weight: 92.5 kg   Refill sent.

## 2023-10-18 DIAGNOSIS — Z961 Presence of intraocular lens: Secondary | ICD-10-CM | POA: Diagnosis not present

## 2023-10-18 DIAGNOSIS — H353133 Nonexudative age-related macular degeneration, bilateral, advanced atrophic without subfoveal involvement: Secondary | ICD-10-CM | POA: Diagnosis not present

## 2023-11-11 ENCOUNTER — Other Ambulatory Visit: Payer: Self-pay | Admitting: Cardiology

## 2023-11-15 ENCOUNTER — Ambulatory Visit
Admission: EM | Admit: 2023-11-15 | Discharge: 2023-11-15 | Disposition: A | Discharge status: Home / Self Care | Payer: Medicare Other | Attending: Family Medicine | Admitting: Family Medicine

## 2023-11-15 DIAGNOSIS — L739 Follicular disorder, unspecified: Secondary | ICD-10-CM | POA: Diagnosis not present

## 2023-11-15 MED ORDER — HYDROXYZINE HCL 25 MG PO TABS: 12.5000 mg | ORAL_TABLET | Freq: Three times a day (TID) | ORAL | 0 refills | Status: DC | PRN

## 2023-11-15 MED ORDER — PREDNISONE 20 MG PO TABS: ORAL_TABLET | ORAL | 0 refills | Status: DC

## 2023-11-15 NOTE — Discharge Instructions (Signed)
 Please start prednisone as a steroid to help with a suspected reactive, inflammatory folliculitis. Use hydroxyzine for itching as needed. Avoid any new products. Stick with hygiene products that you are used to.

## 2023-11-15 NOTE — ED Triage Notes (Signed)
 Pt c/o scattered itching started 1/9-no meds PTA-NAD-steady gait

## 2023-11-15 NOTE — ED Provider Notes (Signed)
 [name] URG[facility]  Note:  This document was prepared using Conservation officer, historic buildings and may include unintentional dictation errors.  MRN: [id number] DOB: 1951/04/06  Subjective:   Tara [name] is a 73 y.o. female presenting for 4-day history of persistent itching over the scalp and neck area.  Denies eating any new foods, starting new medications, exposure to poisonous plants, new hygiene products, new cleaning products or detergents.  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  Reports that she has been told she may have psoriasis.  She cannot appreciate any particular rash to the area.  No pain, redness, plaques, drainage of pus or bleeding, hot sensation.  No exposures to lice.  Despite her atrial ablation, has previously tolerated steroids well.  No current facility-administered medications for this encounter.  Current Outpatient Medications:    alendronate  (FOSAMAX ) 70 MG tablet, Take 1 tablet (70 mg total) by mouth every 7 (seven) days. Take with a full glass of water on an empty stomach., Disp: 4 tablet, Rfl: 11   apixaban  (ELIQUIS ) 5 MG TABS tablet, TAKE 1 TABLET BY MOUTH 2 TIMES A DAY, Disp: 180 tablet, Rfl: 0   aspirin  EC 81 MG tablet, Take 1 tablet (81 mg total) by mouth daily., Disp: , Rfl:    diltiazem  (CARDIZEM  CD) 360 MG 24 hr capsule, Take 1 capsule (360 mg total) by mouth daily. 1rst attempt, patient needs and appt for additional refills, Disp: 30 capsule, Rfl: 1   hydrochlorothiazide  (HYDRODIURIL ) 25 MG tablet, TAKE ONE TABLET BY MOUTH DAILY, Disp: 90 tablet, Rfl: 3   loratadine  (CLARITIN ) 10 MG tablet, Take 1 tablet (10 mg total) by mouth daily as needed for allergies, rhinitis or itching., Disp: 30 tablet, Rfl: 0   metFORMIN  (GLUCOPHAGE ) 500 MG tablet, Take 1 tablet (500 mg total) by mouth daily with breakfast., Disp: 90 tablet, Rfl: 1   Multiple Vitamin (MULTIVITAMIN WITH MINERALS) TABS tablet, Take 1 tablet by  mouth daily., Disp: , Rfl:    Multiple Vitamins-Minerals (PRESERVISION AREDS PO), Take 1 capsule by mouth in the morning and at bedtime. Unknown strenght, Disp: , Rfl:    rosuvastatin  (CRESTOR ) 10 MG tablet, TAKE ONE TABLET BY MOUTH DAILY, Disp: 90 tablet, Rfl: 3   valsartan  (DIOVAN ) 40 MG tablet, TAKE 1 TABLET BY MOUTH DAILY, Disp: 90 tablet, Rfl: 3   Allergies  Allergen Reactions   Ace Inhibitors Cough   Ether Other (See Comments)    unknown    Past Medical History:  Diagnosis Date   Sarcoidosis      Past Surgical History:  Procedure Laterality Date   APPLICATION OF CRANIAL NAVIGATION N/A 01/04/2018   Procedure: APPLICATION OF CRANIAL NAVIGATION;  Surgeon: Unice Pac, MD;  Location: [facility] OR;  Service: Neurosurgery;  Laterality: N/A;   BRAIN SURGERY     COLONOSCOPY WITH PROPOFOL  N/A 11/20/2021   Procedure: COLONOSCOPY WITH PROPOFOL ;  Surgeon: Mansouraty, Aloha Raddle., MD;  Location: [facility] ENDOSCOPY;  Service: Gastroenterology;  Laterality: N/A;   HEMOSTASIS CLIP PLACEMENT  11/20/2021   Procedure: HEMOSTASIS CLIP PLACEMENT;  Surgeon: Wilhelmenia Aloha Raddle., MD;  Location: [facility] ENDOSCOPY;  Service: Gastroenterology;;   POLYPECTOMY  11/20/2021   Procedure: POLYPECTOMY;  Surgeon: Wilhelmenia Aloha Raddle., MD;  Location: [facility] ENDOSCOPY;  Service: Gastroenterology;;   PR DURAL GRAFT SPINAL Left 01/04/2018   Procedure: Left Pterional craniotomy for biopsy with Brainlab;  Surgeon: Unice Pac, MD;  Location: [facility] OR;  Service: Neurosurgery;  Laterality: Left;  Left Pterional craniotomy for biopsy with Brainlab   SCLEROTHERAPY  11/20/2021   Procedure: SCLEROTHERAPY;  Surgeon: Mansouraty, Aloha Raddle., MD;  Location: [facility] ENDOSCOPY;  Service: Gastroenterology;;    Family History  Problem Relation Age of Onset   Colon cancer Mother    Colon cancer Father    CAD Father    Cancer Father    Diabetes Father    Heart disease Father    Hypertension Father    Diabetes Sister    Diabetes Brother      Social History   Tobacco Use   Smoking status: Former    Current packs/day: 0.00    Types: Cigarettes    Quit date: 12/31/2017    Years since quitting: 5.8   Smokeless tobacco: Never  Vaping Use   Vaping status: Never Used  Substance Use Topics   Alcohol use: No   Drug use: No    ROS   Objective:   Vitals: BP 127/75 (BP Location: Left Arm)   Pulse 85   Temp 98 F (36.7 C) (Oral)   Resp 16   SpO2 95%   Physical Exam Constitutional:      General: She is not in acute distress.    Appearance: Normal appearance. She is well-developed. She is not ill-appearing, toxic-appearing or diaphoretic.  HENT:     Head: Normocephalic and atraumatic.     Comments: Diffusely scattered erythematous nodules all associated with hair follicles.  No pustular lesions, tenderness, drainage of pus or bleeding.    Nose: Nose normal.     Mouth/Throat:     Mouth: Mucous membranes are moist.  Eyes:     General: No scleral icterus.       Right eye: No discharge.        Left eye: No discharge.     Extraocular Movements: Extraocular movements intact.  Cardiovascular:     Rate and Rhythm: Normal rate.  Pulmonary:     Effort: Pulmonary effort is normal.  Skin:    General: Skin is warm and dry.  Neurological:     General: No focal deficit present.     Mental Status: She is alert and oriented to person, place, and time.  Psychiatric:        Mood and Affect: Mood normal.        Behavior: Behavior normal.    Assessment and Plan :   PDMP not reviewed this encounter.  1. Folliculitis    Suspect an inflammatory, reactive folliculitis.  Recommended prednisone  and hydroxyzine  for supportive care.  Follow-up with her PCP and/or dermatologist.  Counseled patient on potential for adverse effects with medications prescribed/recommended today, ER and return-to-clinic precautions discussed, patient verbalized understanding.    Christopher Savannah, PA-C 11/15/23 1234

## 2023-11-30 ENCOUNTER — Ambulatory Visit: Payer: Medicare Other | Attending: Cardiology | Admitting: Cardiology

## 2023-11-30 ENCOUNTER — Encounter: Payer: Self-pay | Admitting: Cardiology

## 2023-11-30 VITALS — BP 112/70 | HR 67 | Ht 65.5 in | Wt 202.0 lb

## 2023-11-30 DIAGNOSIS — E782 Mixed hyperlipidemia: Secondary | ICD-10-CM | POA: Insufficient documentation

## 2023-11-30 DIAGNOSIS — D8689 Sarcoidosis of other sites: Secondary | ICD-10-CM | POA: Diagnosis not present

## 2023-11-30 DIAGNOSIS — I480 Paroxysmal atrial fibrillation: Secondary | ICD-10-CM | POA: Diagnosis not present

## 2023-11-30 NOTE — Patient Instructions (Signed)
Medication Instructions:  Your physician recommends that you continue on your current medications as directed. Please refer to the Current Medication list given to you today.  *If you need a refill on your cardiac medications before your next appointment, please call your pharmacy*   Lab Work: None Ordered If you have labs (blood work) drawn today and your tests are completely normal, you will receive your results only by: MyChart Message (if you have MyChart) OR A paper copy in the mail If you have any lab test that is abnormal or we need to change your treatment, we will call you to review the results.   Testing/Procedures: None Ordered   Follow-Up: At [facility], you and your health needs are our priority.  As part of our continuing mission to provide you with exceptional heart care, we have created designated Provider Care Teams.  These Care Teams include your primary Cardiologist (physician) and Advanced Practice Providers (APPs -  Physician Assistants and Nurse Practitioners) who all work together to provide you with the care you need, when you need it.  We recommend signing up for the patient portal called "MyChart".  Sign up information is provided on this After Visit Summary.  MyChart is used to connect with patients for Virtual Visits (Telemedicine).  Patients are able to view lab/test results, encounter notes, upcoming appointments, etc.  Non-urgent messages can be sent to your provider as well.   To learn more about what you can do with MyChart, go to [email or web address].    Your next appointment:   12 month(s)  The format for your next appointment:   In Person  Provider:   Gypsy Balsam, MD    Other Instructions NA

## 2023-11-30 NOTE — Progress Notes (Signed)
Cardiology Office Note:    Date:  11/30/2023   ID:  Tara [name], DOB 06-Apr-1951, MRN [id number]  PCP:  Tara Broker, MD  Cardiologist:  Tara Balsam, MD    Referring MD: Tara [name], *   Chief Complaint  Patient presents with   Follow-up    History of Present Illness:    Tara [name] is a 73 y.o. female past medical history significant paroxysmal atrial fibrillation, she is anticoagulated, history of CVA, history of neurosarcoidosis, essential hypertension, dyslipidemia. Comes today to months for follow-up overall doing well.  Denies have any palpitations.  Denies have any chest pain tightness squeezing pressure burning chest  Past Medical History:  Diagnosis Date   Sarcoidosis     Past Surgical History:  Procedure Laterality Date   APPLICATION OF CRANIAL NAVIGATION N/A 01/04/2018   Procedure: APPLICATION OF CRANIAL NAVIGATION;  Surgeon: Tara Harman, MD;  Location: [facility] OR;  Service: Neurosurgery;  Laterality: N/A;   BRAIN SURGERY     COLONOSCOPY WITH PROPOFOL N/A 11/20/2021   Procedure: COLONOSCOPY WITH PROPOFOL;  Surgeon: Tara Score Netty Starring., MD;  Location: [facility] ENDOSCOPY;  Service: Gastroenterology;  Laterality: N/A;   HEMOSTASIS CLIP PLACEMENT  11/20/2021   Procedure: HEMOSTASIS CLIP PLACEMENT;  Surgeon: Tara Lofty., MD;  Location: [facility] ENDOSCOPY;  Service: Gastroenterology;;   POLYPECTOMY  11/20/2021   Procedure: POLYPECTOMY;  Surgeon: Tara Lofty., MD;  Location: [facility] ENDOSCOPY;  Service: Gastroenterology;;   PR DURAL GRAFT SPINAL Left 01/04/2018   Procedure: Left Pterional craniotomy for biopsy with Brainlab;  Surgeon: Tara Harman, MD;  Location: [facility] OR;  Service: Neurosurgery;  Laterality: Left;  Left Pterional craniotomy for biopsy with Brainlab   SCLEROTHERAPY  11/20/2021   Procedure: SCLEROTHERAPY;  Surgeon: Tara Lofty., MD;  Location: [facility] ENDOSCOPY;  Service: Gastroenterology;;    Current  Medications: Current Meds  Medication Sig   alendronate (FOSAMAX) 70 MG tablet Take 1 tablet (70 mg total) by mouth every 7 (seven) days. Take with a full glass of water on an empty stomach.   apixaban (ELIQUIS) 5 MG TABS tablet TAKE 1 TABLET BY MOUTH 2 TIMES A DAY   aspirin EC 81 MG tablet Take 1 tablet (81 mg total) by mouth daily.   diltiazem (CARDIZEM CD) 360 MG 24 hr capsule Take 1 capsule (360 mg total) by mouth daily. 1rst attempt, patient needs and appt for additional refills   hydrochlorothiazide (HYDRODIURIL) 25 MG tablet TAKE ONE TABLET BY MOUTH DAILY   hydrOXYzine (ATARAX) 25 MG tablet Take 0.5-1 tablets (12.5-25 mg total) by mouth every 8 (eight) hours as needed for itching.   loratadine (CLARITIN) 10 MG tablet Take 1 tablet (10 mg total) by mouth daily as needed for allergies, rhinitis or itching.   metFORMIN (GLUCOPHAGE) 500 MG tablet Take 1 tablet (500 mg total) by mouth daily with breakfast.   Multiple Vitamin (MULTIVITAMIN WITH MINERALS) TABS tablet Take 1 tablet by mouth daily.   Multiple Vitamins-Minerals (PRESERVISION AREDS PO) Take 1 capsule by mouth in the morning and at bedtime. Unknown strenght   predniSONE (DELTASONE) 20 MG tablet Take 2 tablets daily with breakfast. (Patient taking differently: Take 40 mg by mouth daily with breakfast. Take 2 tablets daily with breakfast.)   rosuvastatin (CRESTOR) 10 MG tablet TAKE ONE TABLET BY MOUTH DAILY   valsartan (DIOVAN) 40 MG tablet TAKE 1 TABLET BY MOUTH DAILY     Allergies:   Ace inhibitors and Ether   Social History   Socioeconomic  History   Marital status: Married    Spouse name: Tara [name]   Number of children: 4   Years of education: Not on file   Highest education level: Not on file  Occupational History   Occupation: Retired  Tobacco Use   Smoking status: Former    Current packs/day: 0.00    Types: Cigarettes    Quit date: 12/31/2017    Years since quitting: 5.9   Smokeless tobacco: Never  Vaping Use    Vaping status: Never Used  Substance and Sexual Activity   Alcohol use: No   Drug use: No   Sexual activity: Yes  Other Topics Concern   Not on file  Social History Narrative   Lives with husband.  2 dogs.  4 children and 10 grandchildren.    Social Drivers of Corporate investment banker Strain: Low Risk  (08/03/2022)   Overall Financial Resource Strain (CARDIA)    Difficulty of Paying Living Expenses: Not hard at all  Food Insecurity: No Food Insecurity (08/03/2022)   Hunger Vital Sign    Worried About Running Out of Food in the Last Year: Never true    Ran Out of Food in the Last Year: Never true  Transportation Needs: No Transportation Needs (08/06/2023)   PRAPARE - Administrator, Civil Service (Medical): No    Lack of Transportation (Non-Medical): No  Physical Activity: Sufficiently Active (08/06/2023)   Exercise Vital Sign    Days of Exercise per Week: 4 days    Minutes of Exercise per Session: 50 min  Stress: No Stress Concern Present (08/06/2023)   [employer] of Occupational Health - Occupational Stress Questionnaire    Feeling of Stress : Not at all  Social Connections: Moderately Isolated (08/06/2023)   Social Connection and Isolation Panel [NHANES]    Frequency of Communication with Friends and Family: More than three times a week    Frequency of Social Gatherings with Friends and Family: More than three times a week    Attends Religious Services: Never    Database administrator or Organizations: No    Attends Engineer, structural: Never    Marital Status: Married     Family History: The patient's family history includes CAD in her father; Cancer in her father; Colon cancer in her father and mother; Diabetes in her brother, father, and sister; Heart disease in her father; Hypertension in her father. ROS:   Please see the history of present illness.    All 14 point review of systems negative except as described per history of present  illness  EKGs/Labs/Other Studies Reviewed:    EKG Interpretation Date/Time:  Tuesday November 30 2023 14:43:22 EST Ventricular Rate:  66 PR Interval:  164 QRS Duration:  68 QT Interval:  388 QTC Calculation: 406 R Axis:   19  Text Interpretation: Sinus rhythm with Premature supraventricular complexes Low voltage QRS Nonspecific ST abnormality When compared with ECG of 31-Dec-2017 22:06, PREVIOUS ECG IS PRESENT Confirmed by Tara [name] [id number]) on 11/30/2023 3:05:15 PM    Recent Labs: No results found for requested labs within last 365 days.  Recent Lipid Panel    Component Value Date/Time   CHOL 145 10/22/2022 1138   CHOL 226 (H) 12/10/2020 1019   TRIG 42.0 10/22/2022 1138   HDL 72.80 10/22/2022 1138   HDL 62 12/10/2020 1019   CHOLHDL 2 10/22/2022 1138   VLDL 8.4 10/22/2022 1138   LDLCALC 64 10/22/2022 1138   LDLCALC  147 (H) 12/10/2020 1019    Physical Exam:    VS:  BP 112/70 (BP Location: Right Arm, Patient Position: Sitting)   Pulse 67   Ht 5' 5.5" (1.664 m)   Wt 202 lb (91.6 kg)   SpO2 96%   BMI 33.10 kg/m     Wt Readings from Last 3 Encounters:  11/30/23 202 lb (91.6 kg)  08/06/23 204 lb (92.5 kg)  04/06/23 204 lb 6.4 oz (92.7 kg)     GEN:  Well nourished, well developed in no acute distress HEENT: Normal NECK: No JVD; No carotid bruits LYMPHATICS: No lymphadenopathy CARDIAC: RRR, no murmurs, no rubs, no gallops RESPIRATORY:  Clear to auscultation without rales, wheezing or rhonchi  ABDOMEN: Soft, non-tender, non-distended MUSCULOSKELETAL:  No edema; No deformity  SKIN: Warm and dry LOWER EXTREMITIES: no swelling NEUROLOGIC:  Alert and oriented x 3 PSYCHIATRIC:  Normal affect   ASSESSMENT:    1. Paroxysmal atrial fibrillation (HCC)   2. Neurosarcoidosis   3. Mixed hyperlipidemia    PLAN:    In order of problems listed above:  Paroxysmal atrial fibrillation denies have any symptoms but even before when she had episode of atrial fibrillation  she was asymptomatic she does have Apple Watch and ask her to record from time to time EKG make sure she does not have any atrial fibrillation. Essential hypertension blood pressure well-controlled continue present management. Dyslipidemia I did review K PN which show me LDL 64, HDL 72.  Will continue present management   Medication Adjustments/Labs and Tests Ordered: Current medicines are reviewed at length with the patient today.  Concerns regarding medicines are outlined above.  Orders Placed This Encounter  Procedures   EKG 12-Lead   Medication changes: No orders of the defined types were placed in this encounter.   Signed, Georgeanna Lea, MD, [facility] 11/30/2023 3:20 PM    [facility]

## 2023-12-02 ENCOUNTER — Other Ambulatory Visit: Payer: Self-pay | Admitting: Internal Medicine

## 2023-12-02 DIAGNOSIS — I10 Essential (primary) hypertension: Secondary | ICD-10-CM

## 2023-12-13 ENCOUNTER — Telehealth: Payer: Self-pay

## 2023-12-13 ENCOUNTER — Other Ambulatory Visit (HOSPITAL_COMMUNITY): Payer: Self-pay

## 2023-12-13 MED ORDER — DILTIAZEM HCL ER COATED BEADS 180 MG PO CP24: 360.0000 mg | ORAL_CAPSULE | Freq: Every day | ORAL | 3 refills | Status: DC

## 2023-12-13 NOTE — Telephone Encounter (Signed)
 Needing Prior Auth for Diltiazem  360mg  ER. KEY [id number]

## 2023-12-13 NOTE — Telephone Encounter (Signed)
 Pts insurance will not cover 360mg  Diltiazem  ER 1 tablet daily but will cover 2 180mg  ER tablets daily- sent to [facility] pharmacy. Diltiazem  ER 180 #180 ref x 3

## 2023-12-13 NOTE — Telephone Encounter (Signed)
 Insurance is saying they won't pay for the 360mg  because it is a non formulary drug but they will pay for the 180mg  at 2 a day.   Ran test claim for diltiazem  180g er caps. For a 60 capsules for 30 day supply and the co-pay is 7.13 .   This test claim was processed through [facility]- copay amounts may vary at other pharmacies due to pharmacy/plan contracts, or as the patient moves through the different stages of their insurance plan.

## 2023-12-13 NOTE — Telephone Encounter (Signed)
 LVM for pt regarding the dose of Diltiazem  called to pharmacy - changed from 360 ER tablet to 2 180mg  ER tablets per Insurance.

## 2023-12-22 ENCOUNTER — Ambulatory Visit: Payer: Self-pay | Admitting: Internal Medicine

## 2023-12-22 NOTE — Telephone Encounter (Signed)
  Chief Complaint: Back pain Symptoms: Pain Frequency: Began last night Pertinent Negatives: Patient denies weakness, numbness, loss of bowel or bladder Disposition: [] ED /[] Urgent Care (no appt availability in office) / [x] Appointment(In office/virtual)/ []  [facility]/ [] Home Care/ [x] Refused Recommended Disposition /[] [facility]/ []  Follow-up with PCP Additional Notes: Patient calls reporting lower R back pain that began last night. Patient states she has not tried any OTC medications. Per protocol, patient to be evaluated within 3 days. Patient declined scheduling stating "I will just go to urgent care". This RN offered to assist with scheduling or reviewing wait times with patient, patient declined. Unable to review care advice, patient disconnected call. Alerting PCP for review.    Copied from CRM [id number]. Topic: Clinical - Red Word Triage >> Dec 22, 2023  2:12 PM Florestine Avers wrote: Red Word that prompted transfer to Nurse Triage: Patient states she has a horrible back pain, wants to know what she can take for the pain. She states that it typically happens on her other side of her back and it usually goes away but this time its not. Reason for Disposition  [1] Age > 50 AND [2] no history of prior similar back pain  Answer Assessment - Initial Assessment Questions 1. ONSET: "When did the pain begin?"      Last night 2. LOCATION: "Where does it hurt?" (upper, mid or lower back)     Lower R back 3. SEVERITY: "How bad is the pain?"  (e.g., Scale 1-10; mild, moderate, or severe)   - MILD (1-3): Doesn't interfere with normal activities.    - MODERATE (4-7): Interferes with normal activities or awakens from sleep.    - SEVERE (8-10): Excruciating pain, unable to do any normal activities.      Without movement zero pain, 8/10 with activity 4. PATTERN: "Is the pain constant?" (e.g., yes, no; constant, intermittent)      Intermittent with movement. 5. RADIATION: "Does  the pain shoot into your legs or somewhere else?"     Denies 6. CAUSE:  "What do you think is causing the back pain?"      Denies 7. BACK OVERUSE:  "Any recent lifting of heavy objects, strenuous work or exercise?"     Denies 8. MEDICINES: "What have you taken so far for the pain?" (e.g., nothing, acetaminophen, NSAIDS)     Has not tried any OTC medications 9. NEUROLOGIC SYMPTOMS: "Do you have any weakness, numbness, or problems with bowel/bladder control?"     Denies 10. OTHER SYMPTOMS: "Do you have any other symptoms?" (e.g., fever, abdomen pain, burning with urination, blood in urine)       Denies  Protocols used: Back Pain-A-AH

## 2023-12-27 ENCOUNTER — Ambulatory Visit
Admission: EM | Admit: 2023-12-27 | Discharge: 2023-12-27 | Disposition: A | Discharge status: Home / Self Care | Payer: Medicare Other | Attending: Family Medicine | Admitting: Family Medicine

## 2023-12-27 DIAGNOSIS — L739 Follicular disorder, unspecified: Secondary | ICD-10-CM | POA: Diagnosis not present

## 2023-12-27 MED ORDER — PREDNISONE 20 MG PO TABS: ORAL_TABLET | ORAL | 0 refills | Status: DC

## 2023-12-27 MED ORDER — DOXYCYCLINE HYCLATE 100 MG PO CAPS: 100.0000 mg | ORAL_CAPSULE | Freq: Two times a day (BID) | ORAL | 0 refills | Status: DC

## 2023-12-27 MED ORDER — HYDROXYZINE HCL 25 MG PO TABS: 12.5000 mg | ORAL_TABLET | Freq: Three times a day (TID) | ORAL | 0 refills | Status: DC | PRN

## 2023-12-27 NOTE — ED Triage Notes (Signed)
 Pt c/o itching and irritation to scalp, neck and upper back x 2 weeks-states she was seen for same-NAD-steady gait

## 2023-12-27 NOTE — ED Provider Notes (Signed)
 [name] - URGENT CARE CENTER  Note:  This document was prepared using Conservation officer, historic buildings and may include unintentional dictation errors.  MRN: [id number] DOB: 03-Aug-1951  Subjective:   Tara [name] is a 73 y.o. female presenting for 2-week history of recurrent persistent itching and irritating rash about the scalp, neck and upper back.  Patient reports that she is concerned about psoriasis or neurosarcoidosis affecting her skin still.  She did respond to the steroid course we use last time as well as hydroxyzine.  Has consistently avoided new exposures.  No current facility-administered medications for this encounter.  Current Outpatient Medications:    alendronate (FOSAMAX) 70 MG tablet, Take 1 tablet (70 mg total) by mouth every 7 (seven) days. Take with a full glass of water on an empty stomach., Disp: 4 tablet, Rfl: 11   apixaban (ELIQUIS) 5 MG TABS tablet, TAKE 1 TABLET BY MOUTH 2 TIMES A DAY, Disp: 180 tablet, Rfl: 0   aspirin EC 81 MG tablet, Take 1 tablet (81 mg total) by mouth daily., Disp: , Rfl:    diltiazem (CARDIZEM CD) 180 MG 24 hr capsule, Take 2 capsules (360 mg total) by mouth daily., Disp: 180 capsule, Rfl: 3   hydrochlorothiazide (HYDRODIURIL) 25 MG tablet, TAKE 1 TABLET BY MOUTH DAILY, Disp: 90 tablet, Rfl: 0   hydrOXYzine (ATARAX) 25 MG tablet, Take 0.5-1 tablets (12.5-25 mg total) by mouth every 8 (eight) hours as needed for itching., Disp: 30 tablet, Rfl: 0   loratadine (CLARITIN) 10 MG tablet, Take 1 tablet (10 mg total) by mouth daily as needed for allergies, rhinitis or itching., Disp: 30 tablet, Rfl: 0   metFORMIN (GLUCOPHAGE) 500 MG tablet, Take 1 tablet (500 mg total) by mouth daily with breakfast., Disp: 90 tablet, Rfl: 1   Multiple Vitamin (MULTIVITAMIN WITH MINERALS) TABS tablet, Take 1 tablet by mouth daily., Disp: , Rfl:    Multiple Vitamins-Minerals (PRESERVISION AREDS PO), Take 1 capsule by mouth in the morning and at bedtime.  Unknown strenght, Disp: , Rfl:    predniSONE (DELTASONE) 20 MG tablet, Take 2 tablets daily with breakfast. (Patient taking differently: Take 40 mg by mouth daily with breakfast. Take 2 tablets daily with breakfast.), Disp: 10 tablet, Rfl: 0   rosuvastatin (CRESTOR) 10 MG tablet, TAKE ONE TABLET BY MOUTH DAILY, Disp: 90 tablet, Rfl: 3   valsartan (DIOVAN) 40 MG tablet, TAKE 1 TABLET BY MOUTH DAILY, Disp: 90 tablet, Rfl: 3   Allergies  Allergen Reactions   Ace Inhibitors Cough   Ether Other (See Comments)    unknown    Past Medical History:  Diagnosis Date   Sarcoidosis      Past Surgical History:  Procedure Laterality Date   APPLICATION OF CRANIAL NAVIGATION N/A 01/04/2018   Procedure: APPLICATION OF CRANIAL NAVIGATION;  Surgeon: Maeola Harman, MD;  Location: [facility] OR;  Service: Neurosurgery;  Laterality: N/A;   BRAIN SURGERY     COLONOSCOPY WITH PROPOFOL N/A 11/20/2021   Procedure: COLONOSCOPY WITH PROPOFOL;  Surgeon: Meridee Score Netty Starring., MD;  Location: [facility] ENDOSCOPY;  Service: Gastroenterology;  Laterality: N/A;   HEMOSTASIS CLIP PLACEMENT  11/20/2021   Procedure: HEMOSTASIS CLIP PLACEMENT;  Surgeon: Lemar Lofty., MD;  Location: [facility] ENDOSCOPY;  Service: Gastroenterology;;   POLYPECTOMY  11/20/2021   Procedure: POLYPECTOMY;  Surgeon: Lemar Lofty., MD;  Location[facility] ENDOSCOPY;  Service: Gastroenterology;;   PR DURAL GRAFT SPINAL Left 01/04/2018   Procedure: Left Pterional craniotomy for biopsy with Brainlab;  Surgeon:  Maeola Harman, MD;  Location: [facility] OR;  Service: Neurosurgery;  Laterality: Left;  Left Pterional craniotomy for biopsy with Brainlab   SCLEROTHERAPY  11/20/2021   Procedure: SCLEROTHERAPY;  Surgeon: Mansouraty, Netty Starring., MD;  Location: [facility] ENDOSCOPY;  Service: Gastroenterology;;    Family History  Problem Relation Age of Onset   Colon cancer Mother    Colon cancer Father    CAD Father    Cancer Father    Diabetes Father    Heart disease Father     Hypertension Father    Diabetes Sister    Diabetes Brother     Social History   Tobacco Use   Smoking status: Former    Current packs/day: 0.00    Types: Cigarettes    Quit date: 12/31/2017    Years since quitting: 5.9   Smokeless tobacco: Never  Vaping Use   Vaping status: Never Used  Substance Use Topics   Alcohol use: No   Drug use: No    ROS   Objective:   Vitals: BP 129/77 (BP Location: Right Arm)   Pulse 75   Temp 98.6 F (37 C) (Oral)   Resp 16   SpO2 95%   Physical Exam Constitutional:      General: She is not in acute distress.    Appearance: Normal appearance. She is well-developed. She is not ill-appearing, toxic-appearing or diaphoretic.  HENT:     Head: Normocephalic and atraumatic.     Nose: Nose normal.     Mouth/Throat:     Mouth: Mucous membranes are moist.  Eyes:     General: No scleral icterus.       Right eye: No discharge.        Left eye: No discharge.     Extraocular Movements: Extraocular movements intact.  Cardiovascular:     Rate and Rhythm: Normal rate.  Pulmonary:     Effort: Pulmonary effort is normal.  Skin:    General: Skin is warm and dry.     Findings: Rash (multiple solitary erythematic lesions overlying the neck and scalp; no drainage of pus or bleeding, urticarial lesions) present.  Neurological:     General: No focal deficit present.     Mental Status: She is alert and oriented to person, place, and time.  Psychiatric:        Mood and Affect: Mood normal.        Behavior: Behavior normal.     Assessment and Plan :   PDMP not reviewed this encounter.  1. Folliculitis    Recommend consultation with dermatology.  I advised that we address both inflammatory and bacterial folliculitis.  She did respond with prednisone but did not achieve resolution.  Will supplement her treatment with doxycycline and use hydroxyzine for supportive care as well.  Referral placed to dermatology.  Counseled patient on potential for  adverse effects with medications prescribed/recommended today, ER and return-to-clinic precautions discussed, patient verbalized understanding.    Wallis Bamberg, PA-C 12/27/23 1308

## 2023-12-31 ENCOUNTER — Other Ambulatory Visit: Payer: Self-pay | Admitting: Internal Medicine

## 2023-12-31 DIAGNOSIS — I2510 Atherosclerotic heart disease of native coronary artery without angina pectoris: Secondary | ICD-10-CM

## 2024-01-03 ENCOUNTER — Ambulatory Visit: Payer: Self-pay | Admitting: Internal Medicine

## 2024-01-03 NOTE — Telephone Encounter (Signed)
  Chief Complaint: dizziness and elevated heart rate Symptoms: dizziness; elevated heart rate Frequency: after taking morning meds  Disposition: [] ED /[] Urgent Care (no appt availability in office) / [x] Appointment(In office/virtual)/ []  [facility] [facility]/ [] Home Care/ [] Refused Recommended Disposition /[] [facility]/ []  Follow-up with PCP Additional Notes: Pt calling with concerns of dizziness and elevated heart rate after taking morning meds. Pt was changed from 360mg  of Cardizem to taking two 180mg  due to insurance. For the last 4-5 mornings the pt has instance dizziness and heart rate in 120s for about an  hour. Pt will sit or lie down and the symptoms go away.  Pt thinks there is a medication interaction with her other medications. Per protocol, pt to be seen within 24 hours. Pt has appt tomorrow @ 1520. Pt denies chest pain and SOB. RN gave care advice and pt verbalized understanding.           Copied from CRM [id number]. Topic: Clinical - Red Word Triage >> Jan 03, 2024  1:01 PM Irine Seal wrote: [facility] that prompted transfer to Nurse Triage: medication interaction/ side effects.   The patient was previously on 360 mg of Cardizem, but due to insurance issues, she is now taking 180 mg twice a day. She started experiencing dizziness, tachycardia, and heart palpitations after the change in her Cardizem dosage, along with starting prednisone and doxycycline. She is concerned that these medications might be interacting and causing the symptoms. She is not sure which medications on her list are interacting Reason for Disposition  [1] MODERATE dizziness (e.g., vertigo; feels very unsteady, interferes with normal activities) AND [2] has NOT been evaluated by doctor (or NP/PA) for this  Answer Assessment - Initial Assessment Questions 1. DESCRIPTION: "Describe your dizziness."     Afraid to fall  2. VERTIGO: "Do you feel like either you or the room is spinning or tilting?"       Room is spinning  3. LIGHTHEADED: "Do you feel lightheaded?" (e.g., somewhat faint, woozy, weak upon standing)     At times  4. SEVERITY: "How bad is it?"  "Can you walk?"   - MILD: Feels slightly dizzy and unsteady, but is walking normally.   - MODERATE: Feels unsteady when walking, but not falling; interferes with normal activities (e.g., school, work).   - SEVERE: Unable to walk without falling, or requires assistance to walk without falling.     Denies it currently  5. ONSET:  "When did the dizziness begin?"     4-5 days  6. AGGRAVATING FACTORS: "Does anything make it worse?" (e.g., standing, change in head position)     Tilting head up makes it worse 7. CAUSE: "What do you think is causing the dizziness?"     Changes in medication  8. RECURRENT SYMPTOM: "Have you had dizziness before?" If Yes, ask: "When was the last time?" "What happened that time?"     no 9. OTHER SYMPTOMS: "Do you have any other symptoms?" (e.g., headache, weakness, numbness, vomiting, earache)     Fast heart rate  Protocols used: Dizziness - Vertigo-A-AH

## 2024-01-04 ENCOUNTER — Other Ambulatory Visit: Payer: Self-pay | Admitting: Internal Medicine

## 2024-01-04 ENCOUNTER — Encounter: Payer: Self-pay | Admitting: Internal Medicine

## 2024-01-04 ENCOUNTER — Other Ambulatory Visit: Payer: Self-pay | Admitting: Cardiology

## 2024-01-04 ENCOUNTER — Ambulatory Visit: Admitting: Internal Medicine

## 2024-01-04 VITALS — BP 112/82 | HR 92 | Temp 98.4°F | Ht 65.5 in | Wt 202.0 lb

## 2024-01-04 DIAGNOSIS — E782 Mixed hyperlipidemia: Secondary | ICD-10-CM

## 2024-01-04 DIAGNOSIS — R21 Rash and other nonspecific skin eruption: Secondary | ICD-10-CM

## 2024-01-04 DIAGNOSIS — R739 Hyperglycemia, unspecified: Secondary | ICD-10-CM

## 2024-01-04 DIAGNOSIS — I480 Paroxysmal atrial fibrillation: Secondary | ICD-10-CM

## 2024-01-04 LAB — COMPREHENSIVE METABOLIC PANEL
ALT: 23 U/L (ref 0–35)
AST: 20 U/L (ref 0–37)
Albumin: 4.2 g/dL (ref 3.5–5.2)
Alkaline Phosphatase: 95 U/L (ref 39–117)
BUN: 21 mg/dL (ref 6–23)
CO2: 29 meq/L (ref 19–32)
Calcium: 9.2 mg/dL (ref 8.4–10.5)
Chloride: 97 meq/L (ref 96–112)
Creatinine, Ser: 0.77 mg/dL (ref 0.40–1.20)
GFR: 76.92 mL/min (ref >60.00)
Glucose, Bld: 111 mg/dL — ABNORMAL HIGH (ref 70–99)
Potassium: 3.8 meq/L (ref 3.5–5.1)
Sodium: 133 meq/L — ABNORMAL LOW (ref 135–145)
Total Bilirubin: 0.5 mg/dL (ref 0.2–1.2)
Total Protein: 7.4 g/dL (ref 6.0–8.3)

## 2024-01-04 LAB — CBC
HCT: 40 % (ref 36.0–46.0)
Hemoglobin: 13.5 g/dL (ref 12.0–15.0)
MCHC: 33.9 g/dL (ref 30.0–36.0)
MCV: 94.3 fl (ref 78.0–100.0)
Platelets: 467 10*3/uL — ABNORMAL HIGH (ref 150.0–400.0)
RBC: 4.24 Mil/uL (ref 3.87–5.11)
RDW: 13.2 % (ref 11.5–15.5)
WBC: 4.8 10*3/uL (ref 4.0–10.5)

## 2024-01-04 LAB — LIPID PANEL
Cholesterol: 121 mg/dL (ref 0–200)
HDL: 56.7 mg/dL (ref >39.00)
LDL Cholesterol: 52 mg/dL (ref 0–99)
NonHDL: 64.59
Total CHOL/HDL Ratio: 2
Triglycerides: 65 mg/dL (ref 0.0–149.0)
VLDL: 13 mg/dL (ref 0.0–40.0)

## 2024-01-04 LAB — HEMOGLOBIN A1C: Hgb A1c MFr Bld: 6.3 % (ref 4.6–6.5)

## 2024-01-04 MED ORDER — SELENIUM SULFIDE 2.5 % EX LOTN
1.0000 | TOPICAL_LOTION | Freq: Every day | CUTANEOUS | 12 refills | Status: DC | PRN
Start: 2024-01-04 — End: 2024-05-25

## 2024-01-04 NOTE — Telephone Encounter (Signed)
 Prescription refill request for Eliquis received. Indication:afib Last office visit:1/25 UEA:[id number] labs Age: 73 Weight:91.6  kg  Prescription refilled

## 2024-01-04 NOTE — Progress Notes (Signed)
   Subjective:   Patient ID: Tara [name], female    DOB: 1951-07-11, 73 y.o.   MRN: [id number]  HPI The patient is a 73 YO female coming in for concerns about doxycycline causing tachycardia and some side effects. She was given this for infection recently and she has about 1-2 days left. Overall feeling better from infection but having side effects. Some dizziness when she takes doxycycline as well.   Review of Systems  Constitutional:  Positive for activity change, appetite change and fatigue.  HENT: Negative.    Eyes: Negative.   Respiratory:  Negative for cough, chest tightness and shortness of breath.   Cardiovascular:  Positive for palpitations. Negative for chest pain and leg swelling.  Gastrointestinal:  Negative for abdominal distention, abdominal pain, constipation, diarrhea, nausea and vomiting.  Musculoskeletal: Negative.   Skin: Negative.   Neurological:  Positive for dizziness and light-headedness.  Psychiatric/Behavioral: Negative.      Objective:  Physical Exam Constitutional:      Appearance: She is well-developed.  HENT:     Head: Normocephalic and atraumatic.  Cardiovascular:     Rate and Rhythm: Normal rate and regular rhythm.  Pulmonary:     Effort: Pulmonary effort is normal. No respiratory distress.     Breath sounds: Normal breath sounds. No wheezing or rales.  Abdominal:     General: Bowel sounds are normal. There is no distension.     Palpations: Abdomen is soft.     Tenderness: There is no abdominal tenderness. There is no rebound.  Musculoskeletal:     Cervical back: Normal range of motion.  Skin:    General: Skin is warm and dry.  Neurological:     Mental Status: She is alert and oriented to person, place, and time.     Coordination: Coordination normal.     Vitals:   01/04/24 1532  BP: 112/82  Pulse: 92  Temp: 98.4 F (36.9 C)  TempSrc: Oral  SpO2: 97%  Weight: 202 lb (91.6 kg)  Height: 5' 5.5" (1.664 m)    Assessment & Plan:

## 2024-01-04 NOTE — Patient Instructions (Addendum)
 We have sent in a medicated shampoo to use on the scalp and neck to help daily.

## 2024-01-05 ENCOUNTER — Ambulatory Visit: Payer: Self-pay | Admitting: Internal Medicine

## 2024-01-05 ENCOUNTER — Emergency Department (HOSPITAL_COMMUNITY)
Admission: EM | Admit: 2024-01-05 | Discharge: 2024-01-05 | Disposition: A | Discharge status: Home / Self Care | Attending: Emergency Medicine | Admitting: Emergency Medicine

## 2024-01-05 ENCOUNTER — Encounter: Payer: Self-pay | Admitting: Internal Medicine

## 2024-01-05 DIAGNOSIS — R001 Bradycardia, unspecified: Secondary | ICD-10-CM | POA: Diagnosis not present

## 2024-01-05 DIAGNOSIS — I4891 Unspecified atrial fibrillation: Secondary | ICD-10-CM | POA: Insufficient documentation

## 2024-01-05 DIAGNOSIS — Z87891 Personal history of nicotine dependence: Secondary | ICD-10-CM | POA: Diagnosis not present

## 2024-01-05 DIAGNOSIS — I10 Essential (primary) hypertension: Secondary | ICD-10-CM | POA: Diagnosis not present

## 2024-01-05 DIAGNOSIS — Z79899 Other long term (current) drug therapy: Secondary | ICD-10-CM | POA: Insufficient documentation

## 2024-01-05 DIAGNOSIS — Z7901 Long term (current) use of anticoagulants: Secondary | ICD-10-CM | POA: Diagnosis not present

## 2024-01-05 DIAGNOSIS — R000 Tachycardia, unspecified: Secondary | ICD-10-CM | POA: Diagnosis not present

## 2024-01-05 DIAGNOSIS — R7303 Prediabetes: Secondary | ICD-10-CM

## 2024-01-05 DIAGNOSIS — Z7982 Long term (current) use of aspirin: Secondary | ICD-10-CM | POA: Insufficient documentation

## 2024-01-05 DIAGNOSIS — R002 Palpitations: Secondary | ICD-10-CM | POA: Diagnosis present

## 2024-01-05 DIAGNOSIS — R42 Dizziness and giddiness: Secondary | ICD-10-CM | POA: Diagnosis not present

## 2024-01-05 DIAGNOSIS — I959 Hypotension, unspecified: Secondary | ICD-10-CM | POA: Diagnosis not present

## 2024-01-05 LAB — BASIC METABOLIC PANEL
Anion gap: 12 (ref 5–15)
BUN: 23 mg/dL (ref 8–23)
CO2: 20 mmol/L — ABNORMAL LOW (ref 22–32)
Calcium: 8.5 mg/dL — ABNORMAL LOW (ref 8.9–10.3)
Chloride: 103 mmol/L (ref 98–111)
Creatinine, Ser: 0.89 mg/dL (ref 0.44–1.00)
GFR, Estimated: >60 mL/min (ref >60)
Glucose, Bld: 93 mg/dL (ref 70–99)
Potassium: 3.8 mmol/L (ref 3.5–5.1)
Sodium: 135 mmol/L (ref 135–145)

## 2024-01-05 LAB — CBC WITH DIFFERENTIAL/PLATELET
Abs Immature Granulocytes: 0.04 10*3/uL (ref 0.00–0.07)
Basophils Absolute: 0 10*3/uL (ref 0.0–0.1)
Basophils Relative: 1 %
Eosinophils Absolute: 0.2 10*3/uL (ref 0.0–0.5)
Eosinophils Relative: 3 %
HCT: 39.4 % (ref 36.0–46.0)
Hemoglobin: 12.9 g/dL (ref 12.0–15.0)
Immature Granulocytes: 1 %
Lymphocytes Relative: 12 %
Lymphs Abs: 0.8 10*3/uL (ref 0.7–4.0)
MCH: 31.6 pg (ref 26.0–34.0)
MCHC: 32.7 g/dL (ref 30.0–36.0)
MCV: 96.6 fL (ref 80.0–100.0)
Monocytes Absolute: 0.6 10*3/uL (ref 0.1–1.0)
Monocytes Relative: 9 %
Neutro Abs: 4.8 10*3/uL (ref 1.7–7.7)
Neutrophils Relative %: 74 %
Platelets: 283 10*3/uL (ref 150–400)
RBC: 4.08 MIL/uL (ref 3.87–5.11)
RDW: 12.7 % (ref 11.5–15.5)
WBC: 6.4 10*3/uL (ref 4.0–10.5)
nRBC: 0 % (ref 0.0–0.2)

## 2024-01-05 MED ORDER — SODIUM CHLORIDE 0.9 % IV BOLUS
1000.0000 mL | Freq: Once | INTRAVENOUS | Status: AC
  Administered 2024-01-05: 1000 mL via INTRAVENOUS

## 2024-01-05 NOTE — Telephone Encounter (Signed)
Please advise as MD is out office

## 2024-01-05 NOTE — ED Provider Notes (Signed)
 [place] EMERGENCY DEPARTMENT AT [facility] Provider Note  CSN: [id number] Arrival date & time: 01/05/24 1533  Chief Complaint(s) Atrial Fibrillation  HPI Tara [name] is a 73 y.o. female with past medical history as below, significant for sarcoidosis, paroxysmal atrial fibrillation, monoplegia following CVA, seizures, hypertension who presents to the ED with complaint of atrial fibrillation/hypotension.   She was urgent care with A-fib with RVR, she was hypotensive 99/61 on arrival, she self converted back to sinus rhythm en route. She reports multiple episodes of palpitations over the last week that she attributed to a medication change, the medication was stopped yesterday but again today had 3 episode of rvr at home.  She was symptomatic during these episodes, felt lightheaded, had no chest pain.  No syncope.  She has been compliant with her diltiazem and Eliquis.  No change in bowel or bladder function, no vomiting, no change in p.o. intake, no fevers or chills, no sick contacts recent travel.   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 11/24/2022   Anticoagulant long-term use 11/24/2022   Pseudophakia, both eyes 11/24/2022   PVD (posterior vitreous detachment), bilateral 11/24/2022   Hyperlipidemia 10/21/2021   Paroxysmal atrial fibrillation (HCC) 06/25/2021   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 Authorizing Provider  alendronate (FOSAMAX) 70 MG tablet Take 1 tablet (70 mg total) by mouth every 7 (seven) days. Take with a full glass of water on an empty stomach. 02/04/23   Myrlene Broker, MD  apixaban (ELIQUIS) 5 MG TABS tablet Take 1 tablet (5 mg total) by mouth 2 (two) times daily. NEEDS LABS FOR ELIQUIS REFILLS, PLEASE COME TO OFFICE.  THANK YOU 01/04/24   Georgeanna Lea, MD  aspirin EC 81 MG tablet Take 1 tablet (81 mg total) by mouth daily. 01/21/18   Henreitta Leber, MD  diltiazem (CARDIZEM CD) 180 MG 24 hr capsule Take 2 capsules (360 mg total) by mouth daily. 12/13/23 03/12/24  Georgeanna Lea, MD  hydrochlorothiazide (HYDRODIURIL) 25 MG tablet TAKE 1 TABLET BY MOUTH DAILY 12/02/23   Myrlene Broker, MD  hydrOXYzine (ATARAX) 25 MG tablet Take 0.5-1 tablets (12.5-25 mg total) by mouth every 8 (eight) hours as needed for itching. 12/27/23   Wallis Bamberg, PA-C  loratadine (CLARITIN) 10 MG tablet Take 1 tablet (10 mg total) by mouth daily as needed for allergies, rhinitis or itching. 10/09/20   Narda Bonds, MD  metFORMIN (GLUCOPHAGE) 500 MG tablet Take 1 tablet (500 mg total) by mouth daily with breakfast. 07/22/23   Myrlene Broker, MD  metoprolol tartrate (LOPRESSOR) 25 MG tablet Take 1 tablet (25 mg total) by mouth as needed (for heart rate greater than100). 01/06/24   Flossie Dibble, NP  Multiple Vitamin (MULTIVITAMIN WITH MINERALS) TABS tablet Take 1 tablet by mouth daily.    [provider]  Multiple Vitamins-Minerals (PRESERVISION AREDS PO) Take  1 capsule by mouth in the morning and at bedtime. Unknown strenght    [provider]  rosuvastatin (CRESTOR) 10 MG tablet TAKE ONE TABLET BY MOUTH DAILY 01/07/23   Myrlene Broker, MD  selenium sulfide (SELSUN) 2.5 % lotion Apply 1 Application topically daily as needed for irritation. 01/04/24   Myrlene Broker, MD  valsartan (DIOVAN) 40 MG tablet TAKE 1 TABLET BY MOUTH DAILY 06/01/23   Myrlene Broker, MD                                                                                                                                    Past Surgical History Past Surgical History:  Procedure Laterality Date   APPLICATION OF CRANIAL NAVIGATION N/A 01/04/2018   Procedure: APPLICATION OF CRANIAL NAVIGATION;  Surgeon: Maeola Harman, MD;  Location: [facility] OR;  Service: Neurosurgery;  Laterality: N/A;   BRAIN SURGERY     COLONOSCOPY WITH PROPOFOL N/A 11/20/2021   Procedure: COLONOSCOPY WITH PROPOFOL;  Surgeon: Meridee Score Netty Starring., MD;  Location: [facility] ENDOSCOPY;  Service: Gastroenterology;  Laterality: N/A;   HEMOSTASIS CLIP PLACEMENT  11/20/2021   Procedure: HEMOSTASIS CLIP PLACEMENT;  Surgeon: Lemar Lofty., MD;  Location[facility] ENDOSCOPY;  Service: Gastroenterology;;   POLYPECTOMY  11/20/2021   Procedure: POLYPECTOMY;  Surgeon: Lemar Lofty., MD;  Location[facility] ENDOSCOPY;  Service: Gastroenterology;;   PR DURAL GRAFT SPINAL Left 01/04/2018   Procedure: Left Pterional craniotomy for biopsy with Brainlab;  Surgeon: Maeola Harman, MD;  Location: [facility] OR;  Service: Neurosurgery;  Laterality: Left;  Left Pterional craniotomy for biopsy with Brainlab   SCLEROTHERAPY  11/20/2021   Procedure: SCLEROTHERAPY;  Surgeon: Mansouraty, Netty Starring., MD;  Location: [facility] ENDOSCOPY;  Service: Gastroenterology;;   Family History Family History  Problem Relation Age of Onset   Colon cancer Mother    Colon cancer Father    CAD Father    Cancer Father    Diabetes Father    Heart disease Father    Hypertension Father    Diabetes Sister    Diabetes Brother     Social History Social History   Tobacco Use   Smoking status: Former    Current packs/day: 0.00    Types: Cigarettes    Quit date: 12/31/2017    Years since quitting: 6.0   Smokeless tobacco: Never  Vaping Use   Vaping status: Never  Used  Substance Use Topics   Alcohol use: No   Drug use: No   Allergies Ace inhibitors and Ether  Review of Systems A thorough review of systems was obtained and all systems are negative except as noted in the HPI and PMH.   Physical Exam Vital Signs  I have reviewed the triage vital signs BP 108/63   Pulse 75   Temp 97.7 F (36.5 C) (Oral)   Resp 17   SpO2 95%  Physical Exam Vitals and nursing note  reviewed.  Constitutional:      General: She is not in acute distress.    Appearance: Normal appearance.  HENT:     Head: Normocephalic and atraumatic.     Right Ear: External ear normal.     Left Ear: External ear normal.     Nose: Nose normal.     Mouth/Throat:     Mouth: Mucous membranes are moist.  Eyes:     General: No scleral icterus.       Right eye: No discharge.        Left eye: No discharge.  Cardiovascular:     Rate and Rhythm: Normal rate and regular rhythm.     Pulses: Normal pulses.     Heart sounds: Normal heart sounds.  Pulmonary:     Effort: Pulmonary effort is normal. No respiratory distress.     Breath sounds: Normal breath sounds. No stridor.  Abdominal:     General: Abdomen is flat. There is no distension.     Palpations: Abdomen is soft.     Tenderness: There is no abdominal tenderness.  Musculoskeletal:     Cervical back: No rigidity.     Right lower leg: No edema.     Left lower leg: No edema.  Skin:    General: Skin is warm and dry.     Capillary Refill: Capillary refill takes less than 2 seconds.  Neurological:     Mental Status: She is alert.  Psychiatric:        Mood and Affect: Mood normal.        Behavior: Behavior normal. Behavior is cooperative.     ED Results and Treatments Labs (all labs ordered are listed, but only abnormal results are displayed) Labs Reviewed  BASIC METABOLIC PANEL - Abnormal; Notable for the following components:      Result Value   CO2 20 (*)    Calcium 8.5 (*)    All other components within normal  limits  CBC WITH DIFFERENTIAL/PLATELET                                                                                                                          Radiology No results found.  Pertinent labs & imaging results that were available during my care of the patient were reviewed by me and considered in my medical decision making (see MDM for details).  Medications Ordered in ED Medications  sodium chloride 0.9 % bolus 1,000 mL (0 mLs Intravenous Stopped 01/05/24 1911)  Procedures Procedures  (including critical care time)  Medical Decision Making / ED Course    Medical Decision Making:    YUDIT MODESITT is a 73 y.o. female with past medical history as below, significant for sarcoidosis, paroxysmal atrial fibrillation, monoplegia following CVA, seizures, hypertension who presents to the ED with complaint of atrial fibrillation/hypotension. . The complaint involves an extensive differential diagnosis and also carries with it a high risk of complications and morbidity.  Serious etiology was considered. Ddx includes but is not limited to: Atrial fibrillation, metabolic derangement, dehydration, infectious, medication effect, etc.  Complete initial physical exam performed, notably the patient was in distress, sitting comfortably in stretcher, currently symptomatic.    Reviewed and confirmed nursing documentation for past medical history, family history, social history.  Vital signs reviewed.    Clinical Course as of 01/07/24 0022  Wed Jan 05, 2024  1848 Feeling better on recheck, remains in NSR [SG]    Clinical Course User Index [SG] Sloan Leiter, DO    Brief summary: 73 year old female history of paroxysmal A-fib on diltiazem and Eliquis here with atrial fibrillation RVR  multiple paroxysms of A-fib over the past week, she is now in sinus  rhythm Blood pressure is mildly reduced, will give fluids, check labs, maintain cardiac monitoring.   On recheck she is feeling much better, she is asymptomatic.  No further palpitations.  Blood pressure has stabilized.  Symptoms likely secondary to atrial fibrillation.  Encourage adherence to home medications.  Recommend she follow-up with cardiology for further treatment, medication adjustment if needed.  Etiology of symptoms likely secondary to paroxysmal A-fib.  Recommend follow-up with cardiologist.  The patient improved significantly and was discharged in stable condition. Detailed discussions were had with the patient/guardian regarding current findings, and need for close f/u with PCP or on call doctor. The patient/guardian has been instructed to return immediately if the symptoms worsen in any way for re-evaluation. Patient/guardian verbalized understanding and is in agreement with current care plan. All questions answered prior to discharge.               Additional history obtained: -Additional history obtained from spouse -External records from outside source obtained and reviewed including: Chart review including previous notes, labs, imaging, consultation notes including  Primary care documentation, home medications, cardiology documentation   Lab Tests: -I ordered, reviewed, and interpreted labs.   The pertinent results include:   Labs Reviewed  BASIC METABOLIC PANEL - Abnormal; Notable for the following components:      Result Value   CO2 20 (*)    Calcium 8.5 (*)    All other components within normal limits  CBC WITH DIFFERENTIAL/PLATELET    Notable for stable labs  EKG   EKG Interpretation Date/Time:  Wednesday January 05 2024 15:42:44 EST Ventricular Rate:  69 PR Interval:  135 QRS Duration:  103 QT Interval:  409 QTC Calculation: 439 R Axis:   58  Text Interpretation: Sinus rhythm Low voltage, precordial leads Confirmed by Tanda Rockers ([id number]) on  01/05/2024 4:11:22 PM         Imaging Studies ordered: na   Medicines ordered and prescription drug management: Meds ordered this encounter  Medications   sodium chloride 0.9 % bolus 1,000 mL    -I have reviewed the patients home medicines and have made adjustments as needed   Consultations Obtained: na  Cardiac Monitoring: The patient was maintained on a cardiac monitor.  I personally viewed and interpreted the cardiac monitored  which showed an underlying rhythm of: NSR Continuous pulse oximetry interpreted by myself, 97% on RA.    Social Determinants of Health:  Diagnosis or treatment significantly limited by social determinants of health: na   Reevaluation: After the interventions noted above, I reevaluated the patient and found that they have improved  Co morbidities that complicate the patient evaluation  Past Medical History:  Diagnosis Date   Sarcoidosis       Dispostion: Disposition decision including need for hospitalization was considered, and patient discharged from emergency department.    Final Clinical Impression(s) / ED Diagnoses Final diagnoses:  Atrial fibrillation with RVR (HCC)        Sloan Leiter, DO 01/07/24 0022

## 2024-01-05 NOTE — Discharge Instructions (Signed)
 Be sure to get the rest over the next few days, avoid stimulants such as caffeine.  Avoid stress.  Follow-up with your cardiologist.  It was a pleasure caring for you today in the emergency department.  Please return to the emergency department for any worsening or worrisome symptoms.

## 2024-01-05 NOTE — Telephone Encounter (Signed)
 Copied from CRM [id number]. Topic: Clinical - Red Word Triage >> Jan 05, 2024 12:07 PM Pascal Lux wrote: Red Word that prompted transfer to Nurse Triage: Patient said she saw her provider yesterday and she took her off doxycycline (VIBRAMYCIN) 100 MG capsule [[id number]] and her heart rate 153 beats per minute this morning, feeling dizzy.  Chief Complaint: Increased HR Symptoms: Dizziness, SOB upon exertion Frequency: Over a week Pertinent Negatives: Patient denies chest pain Disposition: [] ED /[] Urgent Care (no appt availability in office) / [x] Appointment(In office/virtual)/ []  [facility]/ [] Home Care/ [x] Refused Recommended Disposition /[] [place] [facility]/ []  Follow-up with PCP Additional Notes: Patient called in to report episodes of increased HR. Patient stated that her HR increased to 153 this morning. Patient stated she experienced dizziness and felt hot during the episode. Patient stated she sat down to rest and symptoms resolved. Patient denied passing out or falling. Patient denied an increased HR and symptoms at this time. Patient stated this has been ongoing for about a week. Patient was seen in the office yesterday and instructed to stop taking her doxycycline, per patient. Patient stated she stopped taking the doxycycline yesterday morning. Patient is wondering if stopping the doxycycline is causing her HR to spike. This RN advised patient to see a provider within 4 hours, per protocol. No availability with PCP. Patient declined an appointment with an alternate provider. Patient asked if she should reach out to her cardiologist. This RN advised patient that it would be a good idea for her to be proactive with this. Patient is requesting a call back regarding medication advice and to potentially schedule an appointment with her PCP. This RN provided education and advised patient on when it is appropriate to seek emergency care. Patient complied.   Reason for Disposition  [1] Heart  beating very rapidly (e.g., > 140 / minute) AND [2] not present now  (Exception: During exercise.)  Answer Assessment - Initial Assessment Questions 1. DESCRIPTION: "Please describe your heart rate or heartbeat that you are having" (e.g., fast/slow, regular/irregular, skipped or extra beats, "palpitations")     States HR just feels fast, but denies skipping beats  2. ONSET: "When did it start?" (Minutes, hours or days)      Over a week, HR higher today than it has been  3. DURATION: "How long does it last" (e.g., seconds, minutes, hours)     Lasted 15-20 minutes today at grocery store 4. PATTERN "Does it come and go, or has it been constant since it started?"  "Does it get worse with exertion?"   "Are you feeling it now?"     States HR randomly increases 5. TAP: "Using your hand, can you tap out what you are feeling on a chair or table in front of you, so that I can hear?" (Note: not all patients can do this)       N/A 6. HEART RATE: "Can you tell me your heart rate?" "How many beats in 15 seconds?"  (Note: not all patients can do this)       153 at 11:14 this morning 7. RECURRENT SYMPTOM: "Have you ever had this before?" If Yes, ask: "When was the last time?" and "What happened that time?"      N/A 8. CAUSE: "What do you think is causing the palpitations?"     Stopped taking doxycycline yesterday 9. CARDIAC HISTORY: "Do you have any history of heart disease?" (e.g., heart attack, angina, bypass surgery, angioplasty, arrhythmia)  History of Afib 10. OTHER SYMPTOMS: "Do you have any other symptoms?" (e.g., dizziness, chest pain, sweating, difficulty breathing)     Dizziness, SOB up exertion ("going up steps", felt hot in grocery store this morning, denies SOB at rest, denies chest pain  Protocols used: Heart Rate and Heartbeat Questions-A-AH

## 2024-01-05 NOTE — ED Triage Notes (Addendum)
 Patient Tara [name] from urgent care for AFIB RVR and self converted. Patient was fluids started by EMS for dizziness & hypotension 99/61. HR 80s 98% RA. No complaints of SHoB. A &O X4

## 2024-01-06 ENCOUNTER — Encounter: Payer: Self-pay | Admitting: Cardiology

## 2024-01-06 ENCOUNTER — Ambulatory Visit: Attending: Cardiology | Admitting: Cardiology

## 2024-01-06 VITALS — BP 110/70 | HR 84 | Ht 65.5 in | Wt 204.0 lb

## 2024-01-06 DIAGNOSIS — I10 Essential (primary) hypertension: Secondary | ICD-10-CM

## 2024-01-06 DIAGNOSIS — R569 Unspecified convulsions: Secondary | ICD-10-CM

## 2024-01-06 DIAGNOSIS — I480 Paroxysmal atrial fibrillation: Secondary | ICD-10-CM | POA: Diagnosis not present

## 2024-01-06 DIAGNOSIS — E782 Mixed hyperlipidemia: Secondary | ICD-10-CM | POA: Diagnosis not present

## 2024-01-06 DIAGNOSIS — D8689 Sarcoidosis of other sites: Secondary | ICD-10-CM

## 2024-01-06 MED ORDER — METOPROLOL TARTRATE 25 MG PO TABS: 25.0000 mg | ORAL_TABLET | ORAL | 1 refills | Status: DC | PRN

## 2024-01-06 NOTE — Progress Notes (Signed)
 Cardiology Office Note:  .   Date:  01/06/2024  ID:  Tara [name], DOB 03-18-1951, MRN [id number] PCP: Myrlene Broker, MD  [facility] Providers Cardiologist:  Gypsy Balsam, MD    History of Present Illness: .   Tara [name] is a 73 y.o. female with a past medical history of paroxysmal atrial fibrillation, hypertension, dyslipidemia, seizure disorder, neurosarcoidosis, history of stroke.  11/26/2022 echo EF 60 to 65%, moderate concentric LVH, grade 1 DD, trivial MR, aortic valve sclerosis present without stenosis 07/15/2021 echo EF 65 to 70%, grade 1 DD, LA severely dilated, mild MR 06/25/2021 monitor average heart rate 74 bpm, predominant rhythm was sinus, 26 episodes of SVT, SVE's were occasional at 2.7%  Most recently evaluated by Dr. Bing Matter on 11/30/2023, she was maintaining sinus rhythm, advised to follow-up in 12 months.  Evaluated in the emergency department at [facility] on 01/05/2023 with atrial fibrillation and RVR and noted to be hypotensive.  She self converted back to sinus rhythm and route to the hospital.  She reported multiple palpitations over the previous week that she felt was attributed to a recent medication change.  Lab work was unremarkable.  She was given a liter of saline and discharged home.  She presents today coming by her husband for follow-up after recent ED visit as outlined above.  She apparently had had 3 episodes of atrial fibrillation on the morning she went to the emergency department, her heart rate was very elevated and so she proceeded to the emergency department.  She has been maintaining sinus rhythm, very well versed when she is out of rhythm.  She denies any recent stressors, viral illnesses, etc. She denies chest pain, palpitations, dyspnea, pnd, orthopnea, n, v, dizziness, syncope, edema, weight gain, or early satiety.   ROS: Review of Systems  All other systems reviewed and are negative.    Studies Reviewed: .         Cardiac Studies & Procedures   ______________________________________________________________________________________________     ECHOCARDIOGRAM  ECHOCARDIOGRAM COMPLETE 11/26/2022  Narrative ECHOCARDIOGRAM REPORT    Patient Name:   Tara [name] Date of Exam: 11/26/2022 Medical Rec #:  [id number]          Height:       66.0 in Accession #:    [id number]         Weight:       208.1 lb Date of Birth:  06-May-1951          BSA:          2.034 m Patient Age:    71 years           BP:           131/87 mmHg Patient Gender: F                  HR:           65 bpm. Exam Location:  [address]  Procedure: 2D Echo, Cardiac Doppler, Color Doppler and Strain Analysis  Indications:    R06.00 SOB  History:        Patient has prior history of Echocardiogram examinations, most recent 07/15/2021. Stroke, Arrythmias:Atrial Fibrillation; Risk Factors:Hypertension and Dyslipidemia.  Sonographer:    Clearence Ped RCS Referring Phys: [phone] ROBERT J KRASOWSKI  IMPRESSIONS   1. Left ventricular ejection fraction, by estimation, is 60 to 65%. The left ventricle has normal function. The left ventricle has no regional wall motion abnormalities. There is moderate  concentric left ventricular hypertrophy. Left ventricular diastolic parameters are consistent with Grade I diastolic dysfunction (impaired relaxation). The average left ventricular global longitudinal strain is -19.0 %. The global longitudinal strain is normal. 2. Right ventricular systolic function is normal. The right ventricular size is normal. There is normal pulmonary artery systolic pressure. The estimated right ventricular systolic pressure is 25.8 mmHg. 3. The mitral valve is normal in structure. Trivial mitral valve regurgitation. No evidence of mitral stenosis. 4. The aortic valve is normal in structure. Aortic valve regurgitation is not visualized. Aortic valve sclerosis/calcification is present, without any evidence of aortic  stenosis. 5. The inferior vena cava is normal in size with greater than 50% respiratory variability, suggesting right atrial pressure of 3 mmHg.  FINDINGS Left Ventricle: Left ventricular ejection fraction, by estimation, is 60 to 65%. The left ventricle has normal function. The left ventricle has no regional wall motion abnormalities. The average left ventricular global longitudinal strain is -19.0 %. The global longitudinal strain is normal. The left ventricular internal cavity size was normal in size. There is moderate concentric left ventricular hypertrophy. Left ventricular diastolic parameters are consistent with Grade I diastolic dysfunction (impaired relaxation). Normal left ventricular filling pressure.  Right Ventricle: The right ventricular size is normal. No increase in right ventricular wall thickness. Right ventricular systolic function is normal. There is normal pulmonary artery systolic pressure. The tricuspid regurgitant velocity is 2.39 m/s, and with an assumed right atrial pressure of 3 mmHg, the estimated right ventricular systolic pressure is 25.8 mmHg.  Left [facility]: Left atrial size was normal in size.  Right At[facility]: Right atrial size was normal in size.  Pericardium: There is no evidence of pericardial effusion.  Mitral Valve: The mitral valve is normal in structure. Trivial mitral valve regurgitation. No evidence of mitral valve stenosis.  Tricuspid Valve: The tricuspid valve is normal in structure. Tricuspid valve regurgitation is mild . No evidence of tricuspid stenosis.  Aortic Valve: The aortic valve is normal in structure. Aortic valve regurgitation is not visualized. Aortic valve sclerosis/calcification is present, without any evidence of aortic stenosis.  Pulmonic Valve: The pulmonic valve was normal in structure. Pulmonic valve regurgitation is trivial. No evidence of pulmonic stenosis.  Aorta: The aortic root is normal in size and structure.  Venous: The  inferior vena cava is normal in size with greater than 50% respiratory variability, suggesting right atrial pressure of 3 mmHg.  IAS/Shunts: No atrial level shunt detected by color flow Doppler.   LEFT VENTRICLE PLAX 2D LVIDd:         4.00 cm   Diastology LVIDs:         2.70 cm   LV e' medial:    7.94 cm/s LV PW:         1.40 cm   LV E/e' medial:  7.7 LV IVS:        1.30 cm   LV e' lateral:   13.10 cm/s LVOT diam:     1.90 cm   LV E/e' lateral: 4.7 LV SV:         85 LV SV Index:   42        2D Longitudinal Strain LVOT Area:     2.84 cm  2D Strain GLS (A2C):   -18.7 % 2D Strain GLS (A3C):   -16.2 % 2D Strain GLS (A4C):   -21.9 % 2D Strain GLS Avg:     -19.0 %  RIGHT VENTRICLE RV Basal diam:  3.40 cm RV S  prime:     15.00 cm/s TAPSE (M-mode): 2.6 cm RVSP:           25.8 mmHg  LEFT ATRIUM             Index        RIGHT ATRIUM           Index LA diam:        3.50 cm 1.72 cm/m   RA Pressure: 3.00 mmHg LA Vol (A2C):   57.3 ml 28.17 ml/m  RA Area:     10.00 cm LA Vol (A4C):   63.4 ml 31.17 ml/m  RA Volume:   19.40 ml  9.54 ml/m LA Biplane Vol: 60.2 ml 29.60 ml/m AORTIC VALVE LVOT Vmax:   124.00 cm/s LVOT Vmean:  84.400 cm/s LVOT VTI:    0.299 m  AORTA Ao Root diam: 3.50 cm Ao Asc diam:  3.20 cm  MITRAL VALVE               TRICUSPID VALVE MV Area (PHT):             TR Peak grad:   22.8 mmHg MV Decel Time:             TR Vmax:        239.00 cm/s MV E velocity: 61.30 cm/s  Estimated RAP:  3.00 mmHg MV A velocity: 95.40 cm/s  RVSP:           25.8 mmHg MV E/A ratio:  0.64 SHUNTS Systemic VTI:  0.30 m Systemic Diam: 1.90 cm  Armanda Magic MD Electronically signed by Armanda Magic MD Signature Date/Time: 11/26/2022/8:29:17 AM    Final    MONITORS  LONG TERM MONITOR (3-14 DAYS) 07/04/2021  Narrative Patch Wear Time:  5 days and 22 hours (2022-08-24T10:46:05-399 to 2022-08-30T09:26:00-0400)  Patient had a min HR of 52 bpm, max HR of 197 bpm, and avg HR of 74 bpm.  Predominant underlying rhythm was Sinus Rhythm. 26 Supraventricular Tachycardia runs occurred, the run with the fastest interval lasting 16 beats with a max rate of 197 bpm, the longest lasting 24.0 secs with an avg rate of 112 bpm. Some episodes of Supraventricular Tachycardia may be possible Atrial Tachycardia with variable block. Isolated SVEs were occasional (2.7%, 16734), SVE Couplets were rare (<1.0%, 181), and SVE Triplets were rare (<1.0%, 18). Isolated VEs were rare (<1.0%), VE Couplets were rare (<1.0%), and no VE Triplets were present. Ventricular Trigeminy was present.  Summary conclusions: 26 episode of supraventricular tachycardia longest episode 24 seconds at rate of 112 bpm. Total burden of supraventricular ectopy 2.7%       ______________________________________________________________________________________________      Risk Assessment/Calculations:    CHA2DS2-VASc Score = 6   This indicates a 9.7% annual risk of stroke. The patient's score is based upon: CHF History: 0 HTN History: 1 Diabetes History: 1 Stroke History: 2 Vascular Disease History: 0 Age Score: 1 Gender Score: 1            Physical Exam:   VS:  BP 110/70 (BP Location: Right Arm, Patient Position: Sitting, Cuff Size: Normal)   Pulse 84   Ht 5' 5.5" (1.664 m)   Wt 204 lb (92.5 kg)   SpO2 98%   BMI 33.43 kg/m    Wt Readings from Last 3 Encounters:  01/06/24 204 lb (92.5 kg)  01/04/24 202 lb (91.6 kg)  11/30/23 202 lb (91.6 kg)    GEN: Well nourished, well developed in no acute distress  NECK: No JVD; No carotid bruits CARDIAC: RRR, no murmurs, rubs, gallops RESPIRATORY:  Clear to auscultation without rales, wheezing or rhonchi  ABDOMEN: Soft, non-tender, non-distended EXTREMITIES:  No edema; No deformity   ASSESSMENT AND PLAN: .   Paroxysmal atrial fibrillation/hypercoagulable state-she is maintaining sinus rhythm, CHA2DS2-VASc score is 5.  Continue Eliquis 5 mg twice daily-no  indication for dose reduction--recent lab work on 01/05/2024 revealed creatinine 0.89, hemoglobin 12.9, hematocrit 39.4.  Continue Cardizem 360 mg daily.  Will give her metoprolol tartrate 25 mg to take as needed for heart rate greater than 100.  Reviewed her most recent echocardiogram, revealed normal atria, I think she would benefit from meeting with one of her EP specialist to see if she would be a candidate for an ablation.  Will refer to EP.  History of stroke with history of neurosarcoidosis-currently on aspirin 81 mg daily.  Dyslipidemia-most recent LDL was controlled at 52, continue Crestor 10 mg daily.  Hypertension-blood pressure well-controlled at 110/70, continue Diovan 40 mg daily.       Dispo: Metoprolol tartrate 25 mg as needed for heart rate greater than 100, refer to EP in [facility] for atrial fibrillation ablation consideration.  Follow-up with Dr. Bing Matter at the [facility] location in 6 months.  Signed, Flossie Dibble, NP

## 2024-01-06 NOTE — Patient Instructions (Signed)
 Medication Instructions:  Your physician has recommended you make the following change in your medication:   START: Metoprolol tartrate 25 mg as needed for heart rate greater than 100.  *If you need a refill on your cardiac medications before your next appointment, please call your pharmacy*   Lab Work: None If you have labs (blood work) drawn today and your tests are completely normal, you will receive your results only by: MyChart Message (if you have MyChart) OR A paper copy in the mail If you have any lab test that is abnormal or we need to change your treatment, we will call you to review the results.   Testing/Procedures: None   Follow-Up: At [facility], you and your health needs are our priority.  As part of our continuing mission to provide you with exceptional heart care, we have created designated Provider Care Teams.  These Care Teams include your primary Cardiologist (physician) and Advanced Practice Providers (APPs -  Physician Assistants and Nurse Practitioners) who all work together to provide you with the care you need, when you need it.  We recommend signing up for the patient portal called "MyChart".  Sign up information is provided on this After Visit Summary.  MyChart is used to connect with patients for Virtual Visits (Telemedicine).  Patients are able to view lab/test results, encounter notes, upcoming appointments, etc.  Non-urgent messages can be sent to your provider as well.   To learn more about what you can do with MyChart, go to [email or web address].    Your next appointment:   6 month(s)  Provider:   Gypsy Balsam, MD    Other Instructions None

## 2024-01-07 ENCOUNTER — Encounter: Payer: Self-pay | Admitting: Internal Medicine

## 2024-01-07 ENCOUNTER — Other Ambulatory Visit: Payer: Self-pay | Admitting: Internal Medicine

## 2024-01-07 DIAGNOSIS — R21 Rash and other nonspecific skin eruption: Secondary | ICD-10-CM | POA: Insufficient documentation

## 2024-01-07 NOTE — Assessment & Plan Note (Signed)
 Needs follow up since here checking lipid panel. Adjust as needed.

## 2024-01-07 NOTE — Assessment & Plan Note (Signed)
 Rate controlled today taking diltiazem. Suspect doxycycline is giving her side effects.

## 2024-01-07 NOTE — Assessment & Plan Note (Signed)
 Given doxycycline for this from urgent care/[facility] and asked her to stop this.

## 2024-01-07 NOTE — Assessment & Plan Note (Signed)
 Needs follow up since here checking HgA1c.

## 2024-01-15 ENCOUNTER — Other Ambulatory Visit: Payer: Self-pay | Admitting: Internal Medicine

## 2024-01-15 DIAGNOSIS — Z6833 Body mass index (BMI) 33.0-33.9, adult: Secondary | ICD-10-CM

## 2024-01-15 DIAGNOSIS — R7303 Prediabetes: Secondary | ICD-10-CM

## 2024-01-26 ENCOUNTER — Encounter: Payer: Self-pay | Admitting: Cardiology

## 2024-01-26 ENCOUNTER — Ambulatory Visit: Attending: Cardiology | Admitting: Cardiology

## 2024-01-26 VITALS — BP 120/76 | HR 60 | Ht 65.5 in | Wt 197.0 lb

## 2024-01-26 DIAGNOSIS — I10 Essential (primary) hypertension: Secondary | ICD-10-CM | POA: Diagnosis not present

## 2024-01-26 DIAGNOSIS — I480 Paroxysmal atrial fibrillation: Secondary | ICD-10-CM | POA: Diagnosis not present

## 2024-01-26 DIAGNOSIS — D6869 Other thrombophilia: Secondary | ICD-10-CM | POA: Diagnosis not present

## 2024-01-26 NOTE — Progress Notes (Signed)
  Electrophysiology Office Note:   Date:  01/26/2024  ID:  Tara [name], DOB [birth year], MRN [id number]  Primary Cardiologist: Gypsy Balsam, MD Primary Heart Failure: None Electrophysiologist: None      History of Present Illness:   Tara [name] is a 73 y.o. female with h/o paroxysmal atrial fibrillation, CVA, neurosarcoidosis, hypertension, hyperlipidemia seen today for  for Electrophysiology evaluation of atrial fibrillation at the request of Gypsy Balsam.    She was seen in the emergency room 01/05/2023 with atrial fibrillation and rapid rates.  She was hypotensive at this time.  She self converted to sinus rhythm en route to the hospital.  She has not had 3 episodes of atrial fibrillation since then.  She did go to the emergency room.  When she is in sinus rhythm, she feels well.  She has significant fatigue, weakness when she is in atrial fibrillation.  She did present to urgent care via [facility] with atrial fibrillation.  She converted to sinus rhythm without intervention.  When she is in sinus rhythm, she feels well and is without complaint.  Review of systems complete and found to be negative unless listed in HPI.   EP Information / Studies Reviewed:    EKG is not ordered today. EKG from 01/07/23 reviewed which showed sinus rhythm        Risk Assessment/Calculations:    CHA2DS2-VASc Score = 6   This indicates a 9.7% annual risk of stroke. The patient's score is based upon: CHF History: 0 HTN History: 1 Diabetes History: 1 Stroke History: 2 Vascular Disease History: 0 Age Score: 1 Gender Score: 1             Physical Exam:   VS:  BP 120/76 (BP Location: Left Arm, Patient Position: Sitting, Cuff Size: Large)   Pulse 60   Ht 5' 5.5" (1.664 m)   Wt 197 lb (89.4 kg)   SpO2 97%   BMI 32.28 kg/m    Wt Readings from Last 3 Encounters:  01/26/24 197 lb (89.4 kg)  01/06/24 204 lb (92.5 kg)  01/04/24 202 lb (91.6 kg)     GEN: Well nourished, well  developed in no acute distress NECK: No JVD; No carotid bruits CARDIAC: Regular rate and rhythm, no murmurs, rubs, gallops RESPIRATORY:  Clear to auscultation without rales, wheezing or rhonchi  ABDOMEN: Soft, non-tender, non-distended EXTREMITIES:  No edema; No deformity   ASSESSMENT AND PLAN:    1.  Paroxysmal atrial fibrillation: Currently on diltiazem and metoprolol.  She is in sinus rhythm today.  She feels well and is without major complaint.  She has had episodes of atrial fibrillation.  We did not have any EKGs of this.  Tyke Outman contact [facility] to have them fax over EKGs.  If this does demonstrate atrial fibrillation, we discussed ablation versus medical management with flecainide.  Risks and benefits of ablation were also discussed.  She Madolyn Ackroyd talk about this further with her family and let us know if she decides on rhythm control.  For now, we Daniyal Tabor let her follow-up with her primary cardiologist unless she wants to move forward with rhythm control.  2.  Secondary hypercoagulable state: Eliquis 5 mg twice daily for atrial fibrillation  3.  Hypertension: Well-controlled  Follow up with Dr. Elberta Fortis  PRN   Signed, Aron Needles Jorja Loa, MD

## 2024-01-26 NOTE — Patient Instructions (Signed)
 Medication Instructions:  Your physician recommends that you continue on your current medications as directed. Please refer to the Current Medication list given to you today.  *If you need a refill on your cardiac medications before your next appointment, please call your pharmacy*   Lab Work: None ordered If you have labs (blood work) drawn today and your tests are completely normal, you will receive your results only by: MyChart Message (if you have MyChart) OR A paper copy in the mail If you have any lab test that is abnormal or we need to change your treatment, we will call you to review the results.   Testing/Procedures: None ordered   Follow-Up: At [facility], you and your health needs are our priority.  As part of our continuing mission to provide you with exceptional heart care, we have created designated Provider Care Teams.  These Care Teams include your primary Cardiologist (physician) and Advanced Practice Providers (APPs -  Physician Assistants and Nurse Practitioners) who all work together to provide you with the care you need, when you need it.  We recommend signing up for the patient portal called "MyChart".  Sign up information is provided on this After Visit Summary.  MyChart is used to connect with patients for Virtual Visits (Telemedicine).  Patients are able to view lab/test results, encounter notes, upcoming appointments, etc.  Non-urgent messages can be sent to your provider as well.   To learn more about what you can do with MyChart, go to [email or web address].    Your next appointment:   To be  determined  The format for your next appointment:   In Person  Provider:   Loman Brooklyn, MD    Thank you for choosing [facility]!!   Dory Horn, RN [phone]  Other Instructions  Please call the office and let us know your decision   Flecainide Tablets What is this medication? FLECAINIDE (FLEK a nide) prevents and treats a fast or  irregular heartbeat (arrhythmia). It is often used to treat a type of arrhythmia known as AFib (atrial fibrillation). It works by slowing down overactive electric signals in the heart, which stabilizes your heart rhythm. It belongs to a group of medications called antiarrhythmics. This medicine may be used for other purposes; ask your health care provider or pharmacist if you have questions. COMMON BRAND NAME(S): Tambocor What should I tell my care team before I take this medication? They need to know if you have any of these conditions: High or low levels of potassium in the blood Heart disease including heart rhythm and heart rate problems Kidney disease Liver disease Recent heart attack An unusual or allergic reaction to flecainide, other medications, foods, dyes, or preservatives Pregnant or trying to get pregnant Breastfeeding How should I use this medication? Take this medication by mouth with a glass of water. Take it as directed on the prescription label at the same time every day. You can take it with or without food. If it upsets your stomach, take it with food. Do not take your medication more often than directed. Do not stop taking this medication suddenly. This may cause serious, heart-related side effects. If your care team wants you to stop the medication, the dose may be slowly lowered over time to avoid any side effects. Talk to your care team about the use of this medication in children. While it may be prescribed for children as young as 1 year for selected conditions, precautions do apply. Overdosage: If you think you  have taken too much of this medicine contact a poison control center or emergency room at once. NOTE: This medicine is only for you. Do not share this medicine with others. What if I miss a dose? If you miss a dose, take it as soon as you can. If it is almost time for your next dose, take only that dose. Do not take double or extra doses. What may interact with  this medication? Do not take this medication with any of the following: Amoxapine Arsenic trioxide Certain antibiotics, such as clarithromycin, erythromycin, gatifloxacin, gemifloxacin, levofloxacin, moxifloxacin, sparfloxacin, or troleandomycin Certain antidepressants, called tricyclic antidepressants such as amitriptyline, imipramine, or nortriptyline Certain medications for irregular heartbeat, such as disopyramide, encainide, moricizine, procainamide, propafenone, and quinidine Cisapride Delavirdine Droperidol Haloperidol Hawthorn Imatinib Levomethadyl Maprotiline Medications for malaria, such as chloroquine and halofantrine Pentamidine Phenothiazines, such as chlorpromazine, mesoridazine, prochlorperazine, thioridazine Pimozide Quinine Ranolazine Ritonavir Sertindole This medication may also interact with the following: Cimetidine Dofetilide Medications for angina or blood pressure Medications for irregular heartbeat, such as amiodarone and digoxin Ziprasidone This list may not describe all possible interactions. Give your health care provider a list of all the medicines, herbs, non-prescription drugs, or dietary supplements you use. Also tell them if you smoke, drink alcohol, or use illegal drugs. Some items may interact with your medicine. What should I watch for while using this medication? Visit your care team for regular checks on your progress. Because your condition and the use of this medication carries some risk, it is a good idea to carry an identification card, necklace, or bracelet with details of your condition, medications, and care team. Check your blood pressure and pulse rate as directed. Know what your blood pressure and pulse rate should be and when tod contact your care team. Your care team may schedule regular blood tests and electrocardiograms to check your progress. This medication may affect your coordination, reaction time, or judgment. Do not drive or  operate machinery until you know how this medication affects you. Sit up or stand slowly to reduce the risk of dizzy or fainting spells. Drinking alcohol with this medication can increase the risk of these side effects. What side effects may I notice from receiving this medication? Side effects that you should report to your care team as soon as possible: Allergic reactions--skin rash, itching, hives, swelling of the face, lips, tongue, or throat Heart failure--shortness of breath, swelling of the ankles, feet, or hands, sudden weight gain, unusual weakness or fatigue Heart rhythm changes--fast or irregular heartbeat, dizziness, feeling faint or lightheaded, chest pain, trouble breathing Liver injury--right upper belly pain, loss of appetite, nausea, light-colored stool, dark yellow or brown urine, yellowing skin or eyes, unusual weakness or fatigue Side effects that usually do not require medical attention (report to your care team if they continue or are bothersome): Blurry vision Constipation Dizziness Fatigue Headache Nausea Tremors or shaking This list may not describe all possible side effects. Call your doctor for medical advice about side effects. You may report side effects to FDA at 1-800-FDA-1088. Where should I keep my medication? Keep out of the reach of children and pets. Store at room temperature between 15 and 30 degrees C (59 and 86 degrees F). Protect from light. Keep container tightly closed. Throw away any unused medication after the expiration date. NOTE: This sheet is a summary. It may not cover all possible information. If you have questions about this medicine, talk to your doctor, pharmacist, or health care provider.  2024 Elsevier/Gold Standard (2022-05-27 00:00:00)    Cardiac Ablation Cardiac ablation is a procedure to destroy (ablate) heart tissue that is sending bad signals. These bad signals cause the heart to beat very fast or in a way that is not normal.  Destroying some tissues can help make the heart rhythm normal. Tell your doctor about: Any allergies you have. All medicines you are taking. These include vitamins, herbs, eye drops, creams, and over-the-counter medicines. Any problems you or family members have had with anesthesia. Any bleeding problems you have. Any surgeries you have had. Any medical conditions you have. Whether you are pregnant or may be pregnant. What are the risks? Your doctor will talk with you about risks. These may include: Infection. Bruising and bleeding. Stroke or blood clots. Damage to nearby areas of your body. Allergies to medicines or dyes. Needing a pacemaker if the heart gets damaged. A pacemaker helps the heart beat normally. The procedure not working. What happens before the procedure? Medicines Ask your doctor about changing or stopping: Your normal medicines. Vitamins, herbs, and supplements. Over-the-counter medicines. Do not take aspirin or ibuprofen unless you are told to. General instructions Follow instructions from your doctor about what you may eat and drink. If you will be going home right after the procedure, plan to have a responsible adult: Take you home from the hospital or clinic. You will not be allowed to drive. Care for you for the time you are told. Ask your doctor what steps will be taken to prevent the spread of germs. What happens during the procedure?  An IV tube will be put into one of your veins. You may be given: A sedative. This helps you relax. Anesthesia. This will: Numb certain areas of your body. The skin on your neck or groin will be numbed. A cut (incision) will be made in your neck or groin. A needle will be put through the cut and into a large vein. The small, thin tube (catheter) will be put into the needle. The tube will be moved to your heart. A type of X-ray (fluoroscopy) will be used to help guide the tube. It will also show constant images of the  heart on a screen. Dye may be put through the tube. This helps your doctor see your heart. An electric current will be sent from the tube to destroy heart tissue in certain areas. The tube will be taken out. Pressure will be held on your cut. This helps stop bleeding. A bandage (dressing) will be put over your cut. The procedure may vary among doctors and hospitals. What happens after the procedure? You will be monitored until you leave the hospital or clinic. This includes checking your blood pressure, heart rate and rhythm, breathing rate, and blood oxygen level. Your cut will be checked for bleeding. You will need to lie still for a few hours. If your groin was used, you will need to keep your leg straight for a few hours after the small, thin tube is removed. This information is not intended to replace advice given to you by your health care provider. Make sure you discuss any questions you have with your health care provider. Document Revised: 04/07/2022 Document Reviewed: 04/07/2022 Elsevier Patient Education  2024 [employer].

## 2024-01-27 ENCOUNTER — Ambulatory Visit (INDEPENDENT_AMBULATORY_CARE_PROVIDER_SITE_OTHER): Admitting: Internal Medicine

## 2024-01-27 ENCOUNTER — Encounter: Payer: Self-pay | Admitting: Internal Medicine

## 2024-01-27 VITALS — BP 138/80 | HR 82 | Temp 97.8°F | Ht 65.5 in | Wt 200.0 lb

## 2024-01-27 DIAGNOSIS — I480 Paroxysmal atrial fibrillation: Secondary | ICD-10-CM | POA: Diagnosis not present

## 2024-01-27 NOTE — Progress Notes (Signed)
   Subjective:   Patient ID: Tara [name], female    DOB: [birth year], 73 y.o.   MRN: [id number]  HPI The patient is a 73 YO female coming in for ER follow up (A fib with rvr converted spontaneously). She has seen cardiology and EP in follow up and they are willing to do ablation to help or start flecainide. She is thinking about ablation. Denies episode since that visit with RVR but some palpitations.   PMH, [facility], social history reviewed and updated  Review of Systems  Constitutional: Negative.   HENT: Negative.    Eyes: Negative.   Respiratory:  Negative for cough, chest tightness and shortness of breath.   Cardiovascular:  Positive for palpitations. Negative for chest pain and leg swelling.  Gastrointestinal:  Negative for abdominal distention, abdominal pain, constipation, diarrhea, nausea and vomiting.  Musculoskeletal: Negative.   Skin: Negative.   Neurological: Negative.   Psychiatric/Behavioral: Negative.      Objective:  Physical Exam Constitutional:      Appearance: She is well-developed.  HENT:     Head: Normocephalic and atraumatic.  Cardiovascular:     Rate and Rhythm: Normal rate and regular rhythm.  Pulmonary:     Effort: Pulmonary effort is normal. No respiratory distress.     Breath sounds: Normal breath sounds. No wheezing or rales.  Abdominal:     General: Bowel sounds are normal. There is no distension.     Palpations: Abdomen is soft.     Tenderness: There is no abdominal tenderness. There is no rebound.  Musculoskeletal:     Cervical back: Normal range of motion.  Skin:    General: Skin is warm and dry.  Neurological:     Mental Status: She is alert and oriented to person, place, and time.     Coordination: Coordination normal.     Vitals:   01/27/24 0853  BP: 138/80  Pulse: 82  Temp: 97.8 F (36.6 C)  TempSrc: Oral  SpO2: 97%  Weight: 200 lb (90.7 kg)  Height: 5' 5.5" (1.664 m)    Assessment & Plan:  Visit time 20 minutes in face  to face communication with patient and coordination of care, additional 10 minutes spent in record review, coordination or care, ordering tests, communicating/referring to other healthcare professionals, documenting in medical records all on the same day of the visit for total time 30 minutes spent on the visit.

## 2024-01-27 NOTE — Assessment & Plan Note (Signed)
 With several episodes of RVR recently. She is considering ablation and we discussed that if successful she could likely stop or reduce diltiazem and/or metoprolol.

## 2024-01-31 ENCOUNTER — Telehealth: Payer: Self-pay | Admitting: Cardiology

## 2024-01-31 ENCOUNTER — Other Ambulatory Visit: Payer: Self-pay | Admitting: Cardiology

## 2024-01-31 DIAGNOSIS — I480 Paroxysmal atrial fibrillation: Secondary | ICD-10-CM

## 2024-01-31 DIAGNOSIS — Z01812 Encounter for preprocedural laboratory examination: Secondary | ICD-10-CM

## 2024-01-31 NOTE — Telephone Encounter (Signed)
 Eliquis 5mg  refill request received. Patient is 73 years old, weight-90.7kg, Crea-0.89 on 01/05/24, Diagnosis-Afib, and last seen by Dr. Elberta Fortis on 01/26/24. Dose is appropriate based on dosing criteria. Will send in refill to requested pharmacy.

## 2024-01-31 NOTE — Telephone Encounter (Signed)
 Left message for the patient to call back.

## 2024-01-31 NOTE — Telephone Encounter (Signed)
 Called patient and she has been having episodes of a-fib that last about 1 - 2 minutes at a time. She was referred to Dr. Elberta Fortis and he gave her the option of taking the medication Flecainide or having an ablation performed. She was asking which one she should have done and Dr. Elberta Fortis stated that it was up to her. Patient called the office today asking if she should start the medication Flecainide or have the ablation. Spoke to Dr. Bing Matter and after speaking to the patient she did not want to take the medication and stated that she wanted to have the ablation. He stated that we should send a note to Dory Horn RN informing her of the patient's choice. A message was sent to Dory Horn RN informing her that the patient would like to have an ablation instead of starting the medication Flecainide and also that she had 2 more episodes of A-fib this past morning. Patient had no further questions at this time.

## 2024-01-31 NOTE — Telephone Encounter (Signed)
 Follow Up:       Patient is returning Tara [name]'s call from today. I

## 2024-01-31 NOTE — Telephone Encounter (Signed)
 Patient is calling to talk with Dr. Bing Matter or nurse regarding a choice Dr. Elberta Fortis suggested to patient

## 2024-01-31 NOTE — Telephone Encounter (Signed)
 Pt returning call, requesting cb

## 2024-01-31 NOTE — Telephone Encounter (Signed)
 Left message to call back

## 2024-02-01 NOTE — Telephone Encounter (Signed)
 Left message to call back

## 2024-02-02 NOTE — Telephone Encounter (Signed)
 Patient stated that she was trying to call Dr. Gershon Crane nurse Lavonna Rua Price back. She stated that she was able to get in contact with her earlier this morning and had already scheduled an appointment to see Dr. Elberta Fortis. Patient had no further questions at this time.

## 2024-02-02 NOTE — Telephone Encounter (Signed)
 Pt would like to schedule ablation for 04/27/24.  Aware office will be in contact to go over instructions at a later date, pt agreeable to plan.

## 2024-02-02 NOTE — Telephone Encounter (Signed)
 Patient was returning call. Please advise ?

## 2024-02-29 ENCOUNTER — Other Ambulatory Visit: Payer: Self-pay | Admitting: Internal Medicine

## 2024-02-29 DIAGNOSIS — I10 Essential (primary) hypertension: Secondary | ICD-10-CM

## 2024-03-01 ENCOUNTER — Other Ambulatory Visit: Payer: Self-pay | Admitting: Internal Medicine

## 2024-03-01 DIAGNOSIS — I2510 Atherosclerotic heart disease of native coronary artery without angina pectoris: Secondary | ICD-10-CM

## 2024-03-28 ENCOUNTER — Telehealth: Payer: Self-pay | Admitting: *Deleted

## 2024-03-28 ENCOUNTER — Encounter: Payer: Self-pay | Admitting: *Deleted

## 2024-03-28 NOTE — Telephone Encounter (Signed)
 Left message asking pt to call office or send in mychart message about arranging lab work prior to her CT next week. Also informed pt that I would be sending CT & ablation instructions via mychart.

## 2024-03-29 ENCOUNTER — Other Ambulatory Visit: Payer: Self-pay | Admitting: *Deleted

## 2024-03-29 DIAGNOSIS — I480 Paroxysmal atrial fibrillation: Secondary | ICD-10-CM

## 2024-03-29 DIAGNOSIS — Z01812 Encounter for preprocedural laboratory examination: Secondary | ICD-10-CM

## 2024-03-29 NOTE — Telephone Encounter (Signed)
 Checked w/ lab, they said I would have to call  back tomorrow to check as they have sent everything out for today. Pt aware I will let he know if she needs to return

## 2024-03-30 LAB — CBC WITH DIFFERENTIAL/PLATELET
Basophils Absolute: 0 10*3/uL (ref 0.0–0.2)
Basos: 1 %
EOS (ABSOLUTE): 0.1 10*3/uL (ref 0.0–0.4)
Eos: 3 %
Hematocrit: 37.6 % (ref 34.0–46.6)
Hemoglobin: 12.1 g/dL (ref 11.1–15.9)
Immature Grans (Abs): 0 10*3/uL (ref 0.0–0.1)
Immature Granulocytes: 0 %
Lymphocytes Absolute: 0.8 10*3/uL (ref 0.7–3.1)
Lymphs: 17 %
MCH: 31.2 pg (ref 26.6–33.0)
MCHC: 32.2 g/dL (ref 31.5–35.7)
MCV: 97 fL (ref 79–97)
Monocytes Absolute: 0.6 10*3/uL (ref 0.1–0.9)
Monocytes: 12 %
Neutrophils Absolute: 3.3 10*3/uL (ref 1.4–7.0)
Neutrophils: 67 %
Platelets: 369 10*3/uL (ref 150–450)
RBC: 3.88 x10E6/uL (ref 3.77–5.28)
RDW: 12.2 % (ref 11.7–15.4)
WBC: 4.8 10*3/uL (ref 3.4–10.8)

## 2024-03-30 LAB — BASIC METABOLIC PANEL WITH GFR
BUN/Creatinine Ratio: 23 (ref 12–28)
BUN: 16 mg/dL (ref 8–27)
CO2: 20 mmol/L (ref 20–29)
Calcium: 9.3 mg/dL (ref 8.7–10.3)
Chloride: 101 mmol/L (ref 96–106)
Creatinine, Ser: 0.69 mg/dL (ref 0.57–1.00)
Glucose: 96 mg/dL (ref 70–99)
Potassium: 4.7 mmol/L (ref 3.5–5.2)
Sodium: 137 mmol/L (ref 134–144)
eGFR: 92 mL/min/{1.73_m2} (ref >59)

## 2024-03-30 NOTE — Telephone Encounter (Signed)
 Confirmed w/ [facility].  Needed labs were drawn.  Pt notified

## 2024-03-31 ENCOUNTER — Other Ambulatory Visit: Payer: Self-pay | Admitting: Internal Medicine

## 2024-04-06 ENCOUNTER — Ambulatory Visit (HOSPITAL_COMMUNITY)
Admission: RE | Admit: 2024-04-06 | Discharge: 2024-04-06 | Disposition: A | Discharge status: Home / Self Care | Source: Ambulatory Visit | Attending: Cardiology | Admitting: Cardiology

## 2024-04-06 DIAGNOSIS — I480 Paroxysmal atrial fibrillation: Secondary | ICD-10-CM | POA: Diagnosis not present

## 2024-04-06 MED ORDER — IOHEXOL 350 MG/ML SOLN
100.0000 mL | Freq: Once | INTRAVENOUS | Status: AC | PRN
  Administered 2024-04-06: 100 mL via INTRAVENOUS

## 2024-04-14 ENCOUNTER — Ambulatory Visit: Payer: Self-pay | Admitting: Cardiology

## 2024-04-18 ENCOUNTER — Encounter: Payer: Self-pay | Admitting: Emergency Medicine

## 2024-04-19 ENCOUNTER — Ambulatory Visit: Admitting: Physician Assistant

## 2024-04-19 ENCOUNTER — Encounter: Payer: Self-pay | Admitting: Physician Assistant

## 2024-04-19 VITALS — BP 91/64

## 2024-04-19 DIAGNOSIS — B852 Pediculosis, unspecified: Secondary | ICD-10-CM

## 2024-04-19 MED ORDER — IVERMECTIN 6 MG PO TABS: 3.0000 | ORAL_TABLET | ORAL | 1 refills | Status: DC

## 2024-04-19 MED ORDER — TRIAMCINOLONE ACETONIDE 0.1 % EX CREA: 1.0000 | TOPICAL_CREAM | Freq: Two times a day (BID) | CUTANEOUS | 1 refills | Status: DC

## 2024-04-19 MED ORDER — LINDANE 1 % EX SHAM: 1.0000 | MEDICATED_SHAMPOO | CUTANEOUS | 1 refills | Status: DC

## 2024-04-19 NOTE — Patient Instructions (Signed)

## 2024-04-19 NOTE — Progress Notes (Signed)
   New Patient Visit   Subjective  Tara [name] is a 73 y.o. female who presents for the following: Itching and bumps of scalp since January. It is starting to go down her back. She scratches until bumps become sores. She was given selenium  sulfide lotion and hydroxyzine  by [facility] and her PCP. They have not helped. She can not sleep because the itch wakes her up.    The following portions of the chart were reviewed this encounter and updated as appropriate: medications, allergies, medical history  Review of Systems:  No other skin or systemic complaints except as noted in HPI or Assessment and Plan.  Objective  Well appearing patient in no apparent distress; mood and affect are within normal limits.   A focused examination was performed of the following areas: Scalp, back   Relevant exam findings are noted in the Assessment and Plan.  Visual inspection and microscope findings compatible with multiple live mites.     Assessment & Plan   PEDICULOSIS Exam: face. Scalp, ears, neck and back   Treatment Plan: Lindane 1% shampoo Wash scalp daily x 2 weeks.  Ivermectin 6 mg Take 3 tablets now and 3 tablets in 10 days.  Triamcinolone 0.1% cream apply to neck twice daily until rash clear.   PEDICULOSIS    Return in about 3 weeks (around 05/10/2024) for rash f/u .  I, Eliot Guernsey, CMA, am acting as scribe for Florena Kozma K, PA-C .   Documentation: I have reviewed the above documentation for accuracy and completeness, and I agree with the above.  Tenessa Marsee K, PA-C

## 2024-04-26 NOTE — Pre-Procedure Instructions (Signed)
 Attempted to call patient regarding procedure instructions.  Left voicemail with the following instructions: Arrival time 0515 Nothing to eat or drink after midnight No meds AM of procedure Responsible person to drive you home and stay with you for 24 hrs  Have you missed any doses of anti-coagulant Eliquis - takes twice a day, if you have missed any doses please let us  know.  Don't take dose morning of procedure.

## 2024-04-26 NOTE — Anesthesia Preprocedure Evaluation (Signed)
 Anesthesia Evaluation  Patient identified by MRN, date of birth, ID band Patient awake    Reviewed: Allergy & Precautions, H&P , NPO status , Patient's Chart, lab work & pertinent test results  Airway Mallampati: III  TM Distance: >3 FB Neck ROM: Full    Dental no notable dental hx. (+) Teeth Intact, Dental Advisory Given   Pulmonary neg pulmonary ROS, former smoker   Pulmonary exam normal breath sounds clear to auscultation       Cardiovascular Exercise Tolerance: Good hypertension, Pt. on medications and Pt. on home beta blockers + dysrhythmias Atrial Fibrillation  Rhythm:Regular Rate:Normal     Neuro/Psych Seizures -, Well Controlled,  CVA, Residual Symptoms  negative psych ROS   GI/Hepatic negative GI ROS, Neg liver ROS,,,  Endo/Other  negative endocrine ROS    Renal/GU negative Renal ROS  negative genitourinary   Musculoskeletal   Abdominal   Peds  Hematology negative hematology ROS (+)   Anesthesia Other Findings   Reproductive/Obstetrics negative OB ROS                             Anesthesia Physical Anesthesia Plan  ASA: 3  Anesthesia Plan: General   Post-op Pain Management: Tylenol  PO (pre-op)*   Induction: Intravenous  PONV Risk Score and Plan: 4 or greater and Ondansetron , Dexamethasone  and Treatment may vary due to age or medical condition  Airway Management Planned: Oral ETT  Additional Equipment:   Intra-op Plan:   Post-operative Plan: Extubation in OR  Informed Consent: I have reviewed the patients History and Physical, chart, labs and discussed the procedure including the risks, benefits and alternatives for the proposed anesthesia with the patient or authorized representative who has indicated his/her understanding and acceptance.     Dental advisory given  Plan Discussed with: CRNA  Anesthesia Plan Comments:        Anesthesia Quick Evaluation

## 2024-04-27 ENCOUNTER — Ambulatory Visit (HOSPITAL_COMMUNITY): Payer: Self-pay | Admitting: Anesthesiology

## 2024-04-27 ENCOUNTER — Encounter (HOSPITAL_COMMUNITY): Payer: Self-pay | Admitting: Cardiology

## 2024-04-27 ENCOUNTER — Ambulatory Visit (HOSPITAL_COMMUNITY)
Admission: RE | Admit: 2024-04-27 | Discharge: 2024-04-27 | Disposition: A | Discharge status: Home / Self Care | Attending: Cardiology | Admitting: Cardiology

## 2024-04-27 ENCOUNTER — Other Ambulatory Visit: Payer: Self-pay

## 2024-04-27 ENCOUNTER — Encounter (HOSPITAL_COMMUNITY)
Admission: RE | Disposition: A | Discharge status: Home / Self Care | Payer: Self-pay | Source: Home / Self Care | Attending: Cardiology

## 2024-04-27 DIAGNOSIS — Z8673 Personal history of transient ischemic attack (TIA), and cerebral infarction without residual deficits: Secondary | ICD-10-CM | POA: Insufficient documentation

## 2024-04-27 DIAGNOSIS — Z87891 Personal history of nicotine dependence: Secondary | ICD-10-CM | POA: Diagnosis not present

## 2024-04-27 DIAGNOSIS — E785 Hyperlipidemia, unspecified: Secondary | ICD-10-CM | POA: Diagnosis not present

## 2024-04-27 DIAGNOSIS — I480 Paroxysmal atrial fibrillation: Secondary | ICD-10-CM

## 2024-04-27 DIAGNOSIS — I10 Essential (primary) hypertension: Secondary | ICD-10-CM | POA: Diagnosis not present

## 2024-04-27 DIAGNOSIS — Z79899 Other long term (current) drug therapy: Secondary | ICD-10-CM | POA: Insufficient documentation

## 2024-04-27 DIAGNOSIS — I4891 Unspecified atrial fibrillation: Secondary | ICD-10-CM

## 2024-04-27 HISTORY — PX: ATRIAL FIBRILLATION ABLATION: EP1191

## 2024-04-27 LAB — POCT ACTIVATED CLOTTING TIME: Activated Clotting Time: 308 s

## 2024-04-27 LAB — GLUCOSE, CAPILLARY
Glucose-Capillary: 104 mg/dL — ABNORMAL HIGH (ref 70–99)
Glucose-Capillary: 120 mg/dL — ABNORMAL HIGH (ref 70–99)

## 2024-04-27 SURGERY — ATRIAL FIBRILLATION ABLATION
Anesthesia: General

## 2024-04-27 MED ORDER — HEPARIN SODIUM (PORCINE) 1000 UNIT/ML IJ SOLN
INTRAMUSCULAR | Status: DC | PRN
  Administered 2024-04-27: 14000 [IU] via INTRAVENOUS
  Administered 2024-04-27: 2000 [IU] via INTRAVENOUS

## 2024-04-27 MED ORDER — EPHEDRINE SULFATE-NACL 50-0.9 MG/10ML-% IV SOSY
PREFILLED_SYRINGE | INTRAVENOUS | Status: DC | PRN
  Administered 2024-04-27: 10 mg via INTRAVENOUS

## 2024-04-27 MED ORDER — PHENYLEPHRINE HCL-NACL 20-0.9 MG/250ML-% IV SOLN
INTRAVENOUS | Status: DC | PRN
  Administered 2024-04-27: 30 ug/min via INTRAVENOUS

## 2024-04-27 MED ORDER — ONDANSETRON HCL 4 MG/2ML IJ SOLN
INTRAMUSCULAR | Status: DC | PRN
  Administered 2024-04-27: 4 mg via INTRAVENOUS

## 2024-04-27 MED ORDER — SODIUM CHLORIDE 0.9% FLUSH: 3.0000 mL | INTRAVENOUS | Status: DC | PRN

## 2024-04-27 MED ORDER — ACETAMINOPHEN 325 MG PO TABS: 650.0000 mg | ORAL_TABLET | ORAL | Status: DC | PRN

## 2024-04-27 MED ORDER — MIDAZOLAM HCL 2 MG/2ML IJ SOLN
INTRAMUSCULAR | Status: AC
Start: 2024-04-27 — End: 2024-04-27
  Filled 2024-04-27: qty 2

## 2024-04-27 MED ORDER — PROPOFOL 10 MG/ML IV BOLUS
INTRAVENOUS | Status: DC | PRN
  Administered 2024-04-27: 120 mg via INTRAVENOUS

## 2024-04-27 MED ORDER — FENTANYL CITRATE (PF) 250 MCG/5ML IJ SOLN
INTRAMUSCULAR | Status: DC | PRN
Start: 2024-04-27 — End: 2024-04-27
  Administered 2024-04-27: 100 ug via INTRAVENOUS

## 2024-04-27 MED ORDER — MIDAZOLAM HCL 2 MG/2ML IJ SOLN
INTRAMUSCULAR | Status: DC | PRN
  Administered 2024-04-27: 2 mg via INTRAVENOUS

## 2024-04-27 MED ORDER — SODIUM CHLORIDE 0.9% FLUSH: 3.0000 mL | Freq: Two times a day (BID) | INTRAVENOUS | Status: DC

## 2024-04-27 MED ORDER — PROTAMINE SULFATE 10 MG/ML IV SOLN
INTRAVENOUS | Status: DC | PRN
  Administered 2024-04-27: 40 mg via INTRAVENOUS

## 2024-04-27 MED ORDER — HEPARIN (PORCINE) IN NACL 1000-0.9 UT/500ML-% IV SOLN
INTRAVENOUS | Status: DC | PRN
  Administered 2024-04-27 (×3): 500 mL

## 2024-04-27 MED ORDER — LIDOCAINE 2% (20 MG/ML) 5 ML SYRINGE
INTRAMUSCULAR | Status: DC | PRN
Start: 2024-04-27 — End: 2024-04-27
  Administered 2024-04-27: 60 mg via INTRAVENOUS

## 2024-04-27 MED ORDER — FENTANYL CITRATE (PF) 100 MCG/2ML IJ SOLN
INTRAMUSCULAR | Status: AC
  Filled 2024-04-27: qty 2

## 2024-04-27 MED ORDER — SODIUM CHLORIDE 0.9 % IV SOLN: INTRAVENOUS | Status: DC

## 2024-04-27 MED ORDER — ROCURONIUM BROMIDE 10 MG/ML (PF) SYRINGE
PREFILLED_SYRINGE | INTRAVENOUS | Status: DC | PRN
  Administered 2024-04-27: 50 mg via INTRAVENOUS

## 2024-04-27 MED ORDER — ACETAMINOPHEN 500 MG PO TABS
1000.0000 mg | ORAL_TABLET | Freq: Once | ORAL | Status: AC
  Administered 2024-04-27: 1000 mg via ORAL
  Filled 2024-04-27: qty 2

## 2024-04-27 MED ORDER — ONDANSETRON HCL 4 MG/2ML IJ SOLN: 4.0000 mg | Freq: Four times a day (QID) | INTRAMUSCULAR | Status: DC | PRN

## 2024-04-27 MED ORDER — SUGAMMADEX SODIUM 200 MG/2ML IV SOLN
INTRAVENOUS | Status: DC | PRN
  Administered 2024-04-27: 350 mg via INTRAVENOUS

## 2024-04-27 MED ORDER — SODIUM CHLORIDE 0.9 % IV SOLN: 250.0000 mL | INTRAVENOUS | Status: DC | PRN

## 2024-04-27 MED ORDER — PHENYLEPHRINE 80 MCG/ML (10ML) SYRINGE FOR IV PUSH (FOR BLOOD PRESSURE SUPPORT)
PREFILLED_SYRINGE | INTRAVENOUS | Status: DC | PRN
  Administered 2024-04-27 (×3): 160 ug via INTRAVENOUS

## 2024-04-27 MED ORDER — DEXAMETHASONE SODIUM PHOSPHATE 10 MG/ML IJ SOLN
INTRAMUSCULAR | Status: DC | PRN
  Administered 2024-04-27: 10 mg via INTRAVENOUS

## 2024-04-27 MED ORDER — ATROPINE SULFATE 1 MG/10ML IJ SOSY
PREFILLED_SYRINGE | INTRAMUSCULAR | Status: DC | PRN
  Administered 2024-04-27: 1 mg via INTRAVENOUS

## 2024-04-27 SURGICAL SUPPLY — 20 items
BAG SNAP BAND KOVER 36X36 (MISCELLANEOUS) IMPLANT
BLANKET WARM UNDERBOD FULL ACC (MISCELLANEOUS) ×1 IMPLANT
CABLE FARASTAR GEN2 SNGL USE (CABLE) IMPLANT
CATH FARAWAVE 2.0 31 (CATHETERS) IMPLANT
CATH GE 8FR SOUNDSTAR (CATHETERS) IMPLANT
CATH OCTARAY 2.0 F 3-3-3-3-3 (CATHETERS) IMPLANT
CATH WEBSTER BI DIR CS D-F CRV (CATHETERS) IMPLANT
CLOSURE MYNX CONTROL 6F/7F (Vascular Products) IMPLANT
CLOSURE PERCLOSE PROSTYLE (VASCULAR PRODUCTS) IMPLANT
COVER SWIFTLINK CONNECTOR (BAG) ×1 IMPLANT
DILATOR VESSEL 38 20CM 16FR (INTRODUCER) IMPLANT
GUIDEWIRE INQWIRE 1.5J.035X260 (WIRE) IMPLANT
KIT VERSACROSS CNCT FARADRIVE (KITS) IMPLANT
PACK EP LF (CUSTOM PROCEDURE TRAY) ×1 IMPLANT
PAD DEFIB RADIO PHYSIO CONN (PAD) ×1 IMPLANT
PATCH CARTO3 (PAD) IMPLANT
SHEATH FARADRIVE STEERABLE (SHEATH) IMPLANT
SHEATH PINNACLE 8F 10CM (SHEATH) IMPLANT
SHEATH PINNACLE 9F 10CM (SHEATH) IMPLANT
SHEATH PROBE COVER 6X72 (BAG) IMPLANT

## 2024-04-27 NOTE — H&P (Signed)
  Electrophysiology Office Note:   Date:  04/27/2024  ID:  Tara [name], DOB 08-May-1951, MRN [id number]  Primary Cardiologist: Lamar Fitch, MD Primary Heart Failure: None Electrophysiologist: None      History of Present Illness:   Tara [name] is a 73 y.o. female with h/o paroxysmal atrial fibrillation, CVA, neurosarcoidosis, hypertension, hyperlipidemia seen today for  for Electrophysiology evaluation of atrial fibrillation at the request of Lamar Fitch.    Today, denies symptoms of palpitations, chest pain, dyspnea, orthopnea, PND, lower extremity edema, claudication, dizziness, presyncope, syncope, bleeding, or neurologic sequela. The patient is tolerating medications without difficulties. Plan ablation today.   EP Information / Studies Reviewed:    EKG is not ordered today. EKG from 01/07/23 reviewed which showed sinus rhythm        Risk Assessment/Calculations:    CHA2DS2-VASc Score = 6   This indicates a 9.7% annual risk of stroke. The patient's score is based upon: CHF History: 0 HTN History: 1 Diabetes History: 1 Stroke History: 2 Vascular Disease History: 0 Age Score: 1 Gender Score: 1             Physical Exam:   VS:  BP 125/67   Pulse 70   Temp 98.1 F (36.7 C) (Oral)   Resp 18   Ht 5' 6 (1.676 m)   Wt 88 kg   SpO2 95%   BMI 31.31 kg/m    Wt Readings from Last 3 Encounters:  04/27/24 88 kg  01/27/24 90.7 kg  01/26/24 89.4 kg    GEN: No acute distress.   Neck: No JVD Cardiac: RRR, no murmurs, rubs, or gallops.  Respiratory: normal BS bilaterally. GI: Soft, nontender, non-distended  MS: No edema; No deformity. Neuro:  Nonfocal  Skin: warm and dry,  Psych: Normal affect    ASSESSMENT AND PLAN:    1.  Paroxysmal atrial fibrillation: [name] has presented today for surgery, with the diagnosis of AF.  The various methods of treatment have been discussed with the patient and family. After consideration of risks,  benefits and other options for treatment, the patient has consented to  Procedure(s): Catheter ablation as a surgical intervention .  Risks include but not limited to complete heart block, stroke, esophageal damage, nerve damage, bleeding, vascular damage, tamponade, perforation, MI, and death. The patient's history has been reviewed, patient examined, no change in status, stable for surgery.  I have reviewed the patient's chart and labs.  Questions were answered to the patient's satisfaction.    [name], MD 04/27/2024 7:08 AM

## 2024-04-27 NOTE — Transfer of Care (Signed)
 Immediate Anesthesia Transfer of Care Note  Patient: Tara [name]  Procedure(s) Performed: ATRIAL FIBRILLATION ABLATION  Patient Location: Cath Lab  Anesthesia Type:General  Level of Consciousness: awake, alert , and oriented  Airway & Oxygen Therapy: Patient Spontanous Breathing  Post-op Assessment: Report given to RN and Post -op Vital signs reviewed and stable  Post vital signs: Reviewed and stable  Last Vitals:  Vitals Value Taken Time  BP 100/68 04/27/24 09:20  Temp 36.6 C 04/27/24 09:18  Pulse 82 04/27/24 09:21  Resp 16 04/27/24 09:21  SpO2 92 % 04/27/24 09:21  Vitals shown include unfiled device data.  Last Pain:  Vitals:   04/27/24 0918  TempSrc: Axillary  PainSc: 0-No pain         Complications: There were no known notable events for this encounter.

## 2024-04-27 NOTE — Anesthesia Postprocedure Evaluation (Signed)
 Anesthesia Post Note  Patient: [name]  Procedure(s) Performed: ATRIAL FIBRILLATION ABLATION     Patient location during evaluation: Phase II Anesthesia Type: General Level of consciousness: awake and alert Pain management: pain level controlled Vital Signs Assessment: post-procedure vital signs reviewed and stable Respiratory status: spontaneous breathing, nonlabored ventilation and respiratory function stable Cardiovascular status: blood pressure returned to baseline and stable Postop Assessment: no apparent nausea or vomiting Anesthetic complications: no  There were no known notable events for this encounter.  Last Vitals:  Vitals:   04/27/24 0950 04/27/24 1004  BP: 103/61 (!) 105/57  Pulse: 77 72  Resp: 14 14  Temp:    SpO2: 93% 93%    Last Pain:  Vitals:   04/27/24 0949  TempSrc: Oral  PainSc: 0-No pain                 Chandan Fly,W. EDMOND

## 2024-04-27 NOTE — Progress Notes (Signed)
 Patient ambulated to BR, Bil groins are untremarkable.

## 2024-04-27 NOTE — Discharge Instructions (Signed)

## 2024-04-27 NOTE — Anesthesia Procedure Notes (Signed)
 Procedure Name: Intubation Date/Time: 04/27/2024 7:51 AM  Performed by: Elby Raelene SAUNDERS, CRNAPre-anesthesia Checklist: Patient identified, Emergency Drugs available, Suction available and Patient being monitored Patient Re-evaluated:Patient Re-evaluated prior to induction Oxygen Delivery Method: Circle System Utilized Preoxygenation: Pre-oxygenation with 100% oxygen Induction Type: IV induction Ventilation: Two handed mask ventilation required Laryngoscope Size: Miller and 2 Grade View: Grade II Tube type: Oral Tube size: 7.0 mm Number of attempts: 1 Airway Equipment and Method: Stylet and Oral airway Placement Confirmation: ETT inserted through vocal cords under direct vision, positive ETCO2 and breath sounds checked- equal and bilateral Secured at: 22 cm Tube secured with: Tape Dental Injury: Teeth and Oropharynx as per pre-operative assessment

## 2024-04-28 ENCOUNTER — Encounter (HOSPITAL_COMMUNITY): Payer: Self-pay | Admitting: Cardiology

## 2024-04-28 ENCOUNTER — Telehealth (HOSPITAL_COMMUNITY): Payer: Self-pay

## 2024-04-28 NOTE — Telephone Encounter (Signed)
 Attempted to reach patient to follow up with procedure completed on 04/27/24, no answer. Left VM for patient to return call.

## 2024-04-30 MED FILL — Fentanyl Citrate Preservative Free (PF) Inj 100 MCG/2ML: INTRAMUSCULAR | Qty: 2 | Status: AC

## 2024-05-01 NOTE — Telephone Encounter (Signed)
 Received a VM from patient after-hours on 04/28/24, stating she was doing well after procedure and had a great experience. States she will be heading to the beach on today, 6/30.   Attempted to reach patient back. DPR on file- left detailed VM for patient with the following information:   It is normal to have bruising, tenderness, mild swelling, and a pea or marble sized lump/knot at the groin site which can take up to three months to resolve.  Get help right away if you notice sudden swelling at the puncture site.  Check your puncture site every day for signs of infection: fever, redness, swelling, pus drainage, warmth, foul odor or excessive pain. If this occurs, please call the office at [phone], to speak with the nurse. Get help right away if your puncture site is bleeding and the bleeding does not stop after applying firm pressure to the area.  You may continue to have skipped beats/ atrial fibrillation during the first several months after your procedure.  It is very important not to miss any doses of your blood thinner Eliquis .  You will follow up with the Afib clinic on 05/25/24 and follow up with the Afib clinic on 07/19/24.

## 2024-05-09 ENCOUNTER — Encounter: Payer: Self-pay | Admitting: Physician Assistant

## 2024-05-09 ENCOUNTER — Ambulatory Visit: Admitting: Physician Assistant

## 2024-05-09 VITALS — BP 103/65 | HR 0

## 2024-05-09 DIAGNOSIS — L821 Other seborrheic keratosis: Secondary | ICD-10-CM

## 2024-05-09 DIAGNOSIS — B852 Pediculosis, unspecified: Secondary | ICD-10-CM

## 2024-05-09 NOTE — Patient Instructions (Signed)

## 2024-05-09 NOTE — Progress Notes (Signed)
   Follow-Up Visit   Subjective  Tara [name] is a 73 y.o. female who presents for the following: follow up from pediculosis treatment.   Pt following up from pediculosis. She was given lindane  shampoo, ivermectin  orally and triamcinolone . Pt finished oral medicine last week. She feels like things have cleared up. SO much better. Able to sleep now.   Also has 2 areas under her lower eyelid that she would like evaluated.   The following portions of the chart were reviewed this encounter and updated as appropriate: medications, allergies, medical history  Review of Systems:  No other skin or systemic complaints except as noted in HPI or Assessment and Plan.  Objective  Well appearing patient in no apparent distress; mood and affect are within normal limits.  A focused examination was performed of the following areas: scalp  Relevant exam findings are noted in the Assessment and Plan.    Assessment & Plan   Pediculosis -RESOLVED  Exam: clear today  SEBORRHEIC KERATOSIS under both eyes - Stuck-on, waxy, tan-brown papules and/or plaques  - Benign-appearing - Discussed benign etiology and prognosis. - Observe - Call for any changes     PEDICULOSIS   SEBORRHEIC KERATOSIS    Return if symptoms worsen or fail to improve.  I, Darice Smock, CMA, am acting as scribe for [employer], PA-C.   Documentation: I have reviewed the above documentation for accuracy and completeness, and I agree with the above.  SANDRIDGE,BRENDA K, PA-C

## 2024-05-23 ENCOUNTER — Other Ambulatory Visit: Payer: Self-pay | Admitting: Internal Medicine

## 2024-05-25 ENCOUNTER — Encounter (HOSPITAL_COMMUNITY): Payer: Self-pay | Admitting: Physician Assistant

## 2024-05-25 ENCOUNTER — Ambulatory Visit (HOSPITAL_COMMUNITY)
Admission: RE | Admit: 2024-05-25 | Discharge: 2024-05-25 | Disposition: A | Discharge status: Home / Self Care | Source: Ambulatory Visit | Attending: Physician Assistant | Admitting: Physician Assistant

## 2024-05-25 VITALS — BP 122/68 | HR 80 | Ht 66.0 in | Wt 198.0 lb

## 2024-05-25 DIAGNOSIS — D6869 Other thrombophilia: Secondary | ICD-10-CM | POA: Insufficient documentation

## 2024-05-25 DIAGNOSIS — I480 Paroxysmal atrial fibrillation: Secondary | ICD-10-CM | POA: Insufficient documentation

## 2024-05-25 NOTE — Progress Notes (Signed)
 Primary Care Physician: Rollene Almarie LABOR, MD Primary Cardiologist: Lamar Fitch, MD Electrophysiologist: Will Gladis Norton, MD  Referring Physician: Dr Norton [name] is a 73 y.o. female with a history of CVA, CAD, neurosarcoidosis, HTN, HLD, atrial fibrillation who presents for follow up in the [facility].  She was seen in the emergency room 01/05/2023 with atrial fibrillation and rapid rates. She was hypotensive at this time. She self converted to sinus rhythm en route to the hospital. She was seen by Dr Norton and underwent afib ablation on 04/27/24. Patient is on Eliquis  for stroke prevention.    Patient presents today for follow up for atrial fibrillation. She reports that she feels well post ablation with no interim symptoms of afib. She does feel like she has more energy. She denies chest pain or groin issues. No bleeding issues on anticoagulation.   Today, she denies symptoms of palpitations, chest pain, shortness of breath, orthopnea, PND, lower extremity edema, dizziness, presyncope, syncope, snoring, daytime somnolence, bleeding, or neurologic sequela. The patient is tolerating medications without difficulties and is otherwise without complaint today.    Atrial Fibrillation Risk Factors:  she does not have symptoms or diagnosis of sleep apnea. she does not have a history of rheumatic fever.   Atrial Fibrillation Management history:  Previous antiarrhythmic drugs: none Previous cardioversions: none Previous ablations: none Anticoagulation history: Eliquis   ROS- All systems are reviewed and negative except as per the HPI above.  Past Medical History:  Diagnosis Date   Hypertension    Sarcoidosis    Stroke [facility])     Current Outpatient Medications  Medication Sig Dispense Refill   alendronate  (FOSAMAX ) 70 MG tablet TAKE 1 TABLET BY MOUTH ONCE WEEKLY WITH A FULL GLASS OF WATER ON AN EMPTY STOMACH 12 tablet 3    apixaban  (ELIQUIS ) 5 MG TABS tablet Take 1 tablet (5 mg total) by mouth 2 (two) times daily. 180 tablet 1   aspirin  EC 81 MG tablet Take 1 tablet (81 mg total) by mouth daily.     diltiazem  (CARDIZEM  CD) 180 MG 24 hr capsule Take 2 capsules (360 mg total) by mouth daily. 180 capsule 3   hydrochlorothiazide  (HYDRODIURIL ) 25 MG tablet TAKE 1 TABLET BY MOUTH DAILY 90 tablet 0   metFORMIN  (GLUCOPHAGE ) 500 MG tablet TAKE 1 TABLET BY MOUTH DAILY WITH BREAKFAST 90 tablet 1   metoprolol  tartrate (LOPRESSOR ) 25 MG tablet Take 1 tablet (25 mg total) by mouth as needed (for heart rate greater than100). 90 tablet 1   Multiple Vitamin (MULTIVITAMIN WITH MINERALS) TABS tablet Take 1 tablet by mouth daily.     Multiple Vitamins-Minerals (PRESERVISION AREDS PO) Take 1 capsule by mouth in the morning and at bedtime. Unknown strenght     rosuvastatin  (CRESTOR ) 10 MG tablet TAKE 1 TABLET BY MOUTH DAILY 90 tablet 1   valsartan  (DIOVAN ) 40 MG tablet TAKE 1 TABLET BY MOUTH DAILY 90 tablet 0   No current facility-administered medications for this encounter.    Physical Exam: BP 122/68   Pulse 80   Ht 5' 6 (1.676 m)   Wt 89.8 kg   BMI 31.96 kg/m   GEN: Well nourished, well developed in no acute distress CARDIAC: Regular rate and rhythm, no murmurs, rubs, gallops RESPIRATORY:  Clear to auscultation without rales, wheezing or rhonchi  ABDOMEN: Soft, non-tender, non-distended EXTREMITIES:  No edema; No deformity   Wt Readings from Last 3 Encounters:  05/25/24 89.8 kg  04/27/24 88 kg  01/27/24 90.7 kg     EKG today demonstrates  SR Vent. rate 80 BPM PR interval 160 ms QRS duration 74 ms QT/QTcB 376/433 ms   Echo 11/26/22 demonstrated   1. Left ventricular ejection fraction, by estimation, is 60 to 65%. The  left ventricle has normal function. The left ventricle has no regional  wall motion abnormalities. There is moderate concentric left ventricular  hypertrophy. Left ventricular diastolic  parameters are consistent with Grade I diastolic dysfunction (impaired relaxation). The average left ventricular global longitudinal strain is -19.0 %. The global longitudinal strain is normal.   2. Right ventricular systolic function is normal. The right ventricular  size is normal. There is normal pulmonary artery systolic pressure. The  estimated right ventricular systolic pressure is 25.8 mmHg.   3. The mitral valve is normal in structure. Trivial mitral valve  regurgitation. No evidence of mitral stenosis.   4. The aortic valve is normal in structure. Aortic valve regurgitation is  not visualized. Aortic valve sclerosis/calcification is present, without  any evidence of aortic stenosis.   5. The inferior vena cava is normal in size with greater than 50%  respiratory variability, suggesting right atrial pressure of 3 mmHg.    CHA2DS2-VASc Score = 6  The patient's score is based upon: CHF History: 0 HTN History: 1 Diabetes History: 1 Stroke History: 2 Vascular Disease History: 0 Age Score: 1 Gender Score: 1       ASSESSMENT AND PLAN: Paroxysmal Atrial Fibrillation (ICD10:  I48.0) The patient's CHA2DS2-VASc score is 6, indicating a 9.7% annual risk of stroke.   S/p afib ablation 04/27/24 Patient appears to be maintaining SR Continue Eliquis  5 mg BID with no missed doses for 3 months post ablation. Continue diltiazem  360 mg daily (two 180 mg tablets) Continue Lopressor  25 mg PRN for heart racing.   Secondary Hypercoagulable State (ICD10:  D68.69) The patient is at significant risk for stroke/thromboembolism based upon her CHA2DS2-VASc Score of 6.  Continue Apixaban  (Eliquis ). No bleeding issues.   HTN Stable on current regimen  CAD CAC score 189 No anginal symptoms Followed by Dr Bernie    Follow up in the AF clinic in 2 months.        [facility] [address] [place], [place] [address] [phone]

## 2024-05-29 NOTE — Progress Notes (Signed)
 [facility] - Clinic Note  06/06/2024   CHIEF COMPLAINT Patient presents for Retina Evaluation   HISTORY OF PRESENT ILLNESS: Tara [name] is a 73 y.o. female who presents to the clinic today for:  HPI     Retina Evaluation   In both eyes.  I, the attending physician,  performed the HPI with the patient and updated documentation appropriately.        Comments   Pt has been referred from Dr. Loreli for ARMD. Pt states she has just one small floater that she doesn't notice very often. Pt does not use ats. Pt denies FOL/pain. Pt has had brain surgery and does not have good memory. Pt states both her younger and older sister have ARMD. A1c=6.3, 01/04/24      Last edited by Valdemar Rogue, MD on 06/11/2024  2:12 AM.    Pt is self-referral for non-exu ARMD OU, pt states she started out seeing Dr Maranda, pts last appt with Dr. Maree was in December, pt states she wanted a dr that is closer to her house  Referring physician: Rollene Almarie LABOR, MD [address] [place],  [place] [address]  HISTORICAL INFORMATION:  Selected notes from the MEDICAL RECORD NUMBER Self referral for non-exu ARMD OU - previously followed at [facility] w/ Drs. Maranda and Maree LEE:  Ocular Hx- PMH-   CURRENT MEDICATIONS: No current outpatient medications on file. (Ophthalmic Drugs)   No current facility-administered medications for this visit. (Ophthalmic Drugs)   Current Outpatient Medications (Other)  Medication Sig   alendronate  (FOSAMAX ) 70 MG tablet TAKE 1 TABLET BY MOUTH ONCE WEEKLY WITH A FULL GLASS OF WATER ON AN EMPTY STOMACH   apixaban  (ELIQUIS ) 5 MG TABS tablet Take 1 tablet (5 mg total) by mouth 2 (two) times daily.   aspirin  EC 81 MG tablet Take 1 tablet (81 mg total) by mouth daily.   diltiazem  (CARDIZEM  CD) 180 MG 24 hr capsule Take 2 capsules (360 mg total) by mouth daily.   hydrochlorothiazide  (HYDRODIURIL ) 25 MG tablet TAKE 1 TABLET BY MOUTH DAILY   metFORMIN   (GLUCOPHAGE ) 500 MG tablet TAKE 1 TABLET BY MOUTH DAILY WITH BREAKFAST   metoprolol  tartrate (LOPRESSOR ) 25 MG tablet Take 1 tablet (25 mg total) by mouth as needed (for heart rate greater than100).   Multiple Vitamin (MULTIVITAMIN WITH MINERALS) TABS tablet Take 1 tablet by mouth daily.   Multiple Vitamins-Minerals (PRESERVISION AREDS PO) Take 1 capsule by mouth in the morning and at bedtime. Unknown strenght   rosuvastatin  (CRESTOR ) 10 MG tablet TAKE 1 TABLET BY MOUTH DAILY   valsartan  (DIOVAN ) 40 MG tablet TAKE 1 TABLET BY MOUTH DAILY   No current facility-administered medications for this visit. (Other)   REVIEW OF SYSTEMS: ROS   Positive for: Gastrointestinal, Endocrine, Cardiovascular (AFIB) Negative for: Constitutional, Neurological, Skin, Genitourinary, Musculoskeletal, HENT, Eyes, Respiratory, Psychiatric, Allergic/Imm, Heme/Lymph Last edited by Elnor Avelina RAMAN, COT on 06/06/2024  8:39 AM.     ALLERGIES Allergies  Allergen Reactions   Ace Inhibitors Cough   Ether Other (See Comments)    unknown   PAST MEDICAL HISTORY Past Medical History:  Diagnosis Date   Hypertension    Sarcoidosis    Stroke [facility])    Past Surgical History:  Procedure Laterality Date   APPLICATION OF CRANIAL NAVIGATION N/A 01/04/2018   Procedure: APPLICATION OF CRANIAL NAVIGATION;  Surgeon: Unice Pac, MD;  Location: [facility] OR;  Service: Neurosurgery;  Laterality: N/A;   ATRIAL FIBRILLATION  ABLATION N/A 04/27/2024   Procedure: ATRIAL FIBRILLATION ABLATION;  Surgeon: Inocencio Soyla Lunger, MD;  Location: [facility] INVASIVE CV LAB;  Service: Cardiovascular;  Laterality: N/A;   BRAIN SURGERY     COLONOSCOPY WITH PROPOFOL  N/A 11/20/2021   Procedure: COLONOSCOPY WITH PROPOFOL ;  Surgeon: Mansouraty, Aloha Raddle., MD;  Location: [facility] ENDOSCOPY;  Service: Gastroenterology;  Laterality: N/A;   HEMOSTASIS CLIP PLACEMENT  11/20/2021   Procedure: HEMOSTASIS CLIP PLACEMENT;  Surgeon: Wilhelmenia Aloha Raddle., MD;  Location: [facility]  ENDOSCOPY;  Service: Gastroenterology;;   POLYPECTOMY  11/20/2021   Procedure: POLYPECTOMY;  Surgeon: Wilhelmenia Aloha Raddle., MD;  Location: [facility] ENDOSCOPY;  Service: Gastroenterology;;   PR DURAL GRAFT SPINAL Left 01/04/2018   Procedure: Left Pterional craniotomy for biopsy with Brainlab;  Surgeon: Unice Pac, MD;  Location[facility] OR;  Service: Neurosurgery;  Laterality: Left;  Left Pterional craniotomy for biopsy with Brainlab   SCLEROTHERAPY  11/20/2021   Procedure: SCLEROTHERAPY;  Surgeon: Mansouraty, Aloha Raddle., MD;  Location: [facility] ENDOSCOPY;  Service: Gastroenterology;;   FAMILY HISTORY Family History  Problem Relation Age of Onset   Colon cancer Mother    Asthma Mother        Had as a child   Heart disease Mother    Heart failure Mother    Colon cancer Father    CAD Father    Cancer Father    Diabetes Father    Heart disease Father    Hypertension Father    Diabetes Sister    Macular degeneration Sister    Diabetes Brother    SOCIAL HISTORY Social History   Tobacco Use   Smoking status: Former    Current packs/day: 0.00    Types: Cigarettes    Quit date: 12/31/2017    Years since quitting: 6.4   Smokeless tobacco: Never   Tobacco comments:    Former smoker 05/25/24  Vaping Use   Vaping status: Never Used  Substance Use Topics   Alcohol use: No   Drug use: No       OPHTHALMIC EXAM:  Base Eye Exam     Visual Acuity (Snellen - Linear)       Right Left   Dist [place] 20/40 -2 20/30 +1   Dist ph [place] 20/25 -2 20/25 -2         Tonometry (Tonopen, 8:32 AM)       Right Left   Pressure 17 16         Pupils       Pupils Dark Light Shape React APD   Right PERRL 3 2 Round Brisk None   Left PERRL 3 2 Round Brisk None         Visual Fields       Left Right    Full Full         Extraocular Movement       Right Left    Full, Ortho Full, Ortho         Neuro/Psych     Oriented x3: Yes         Dilation     Both eyes: 1.0% Mydriacyl, 2.5%  Phenylephrine  @ 8:33 AM           Slit Lamp and Fundus Exam     Slit Lamp Exam       Right Left   Lids/Lashes Dermatochalasis - upper lid Dermatochalasis - upper lid   Conjunctiva/Sclera nasal pingeucula nasal pingeucula   Cornea well healed cataract wound, trace tear film debris  arcus, well healed cataract wound   Anterior Chamber deep and clear deep and clear   Iris Round and dilated, No NVI Round and dilated, No NVI   Lens PC IOL in good position, 1+ non-central Posterior capsular opacification PC IOL in good position   Anterior Vitreous Posterior vitreous detachment          Fundus Exam       Right Left   Disc Pink and Sharp, PPA Pink and Sharp, PPA, mild tilt   C/D Ratio 0.2 0.3   Macula Flat, Blunted foveal reflex, scattered GA with central sparring Flat, Blunted foveal reflex, diffuse GA with central sparring and pigment clumping, Drusen   Vessels attenuated, mild tortuosity mild attenuation, mild tortuosity   Periphery Attached, No heme Attached, No heme           IMAGING AND PROCEDURES  Imaging and Procedures for 06/06/2024  OCT, Retina - OU - Both Eyes       Right Eye Quality was good. Central Foveal Thickness: 222. Progression has no prior data. Findings include normal foveal contour, no IRF, no SRF, retinal drusen , outer retinal atrophy (Diffuse non-central ORA / GA).   Left Eye Quality was good. Central Foveal Thickness: 232. Progression has no prior data. Findings include normal foveal contour, no IRF, retinal drusen , outer retinal atrophy (Diffuse non-central ORA / GA).   Notes *Images captured and stored on drive  Diagnosis / Impression:  NFP, no IRF / SRF OU Diffuse ORA / GA with central sparing OU (OD>OS)  Clinical management:  See below  Abbreviations: NFP - Normal foveal profile. CME - cystoid macular edema. PED - pigment epithelial detachment. IRF - intraretinal fluid. SRF - subretinal fluid. EZ - ellipsoid zone. ERM - epiretinal  membrane. ORA - outer retinal atrophy. ORT - outer retinal tubulation. SRHM - subretinal hyper-reflective material. IRHM - intraretinal hyper-reflective material           ASSESSMENT/PLAN:   ICD-10-CM   1. 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  H35.3133 OCT, Retina - OU - Both Eyes    2. Diabetes mellitus type 2 without retinopathy (HCC)  E11.9     3. Long term (current) use of oral hypoglycemic drugs  Z79.84     4. Essential hypertension  I10     5. Hypertensive retinopathy of both eyes  H35.033     6. Pseudophakia, both eyes  Z96.1      Age related macular degeneration, non-exudative, both eyes - advanced stage w/o subfoveal involvement - OCT shows diffuse ORA/GA w/ central sparing  - The incidence, anatomy, and pathology of dry AMD, risk of progression, and the AREDS and AREDS 2 study including smoking risks discussed with patient.  - Recommend amsler grid monitoring  - f/u 4 months, DFE, OCT  2,3. Diabetes mellitus, type 2 without retinopathy - The incidence, risk factors for progression, natural history and treatment options for diabetic retinopathy  were discussed with patient.   - The need for close monitoring of blood glucose, blood pressure, and serum lipids, avoiding cigarette or any type of tobacco, and the need for long term follow up was also discussed with patient. - f/u in 1 year, sooner prn  4,5. Hypertensive retinopathy OU - discussed importance of tight BP control - monitor  6. Pseudophakia OU  - s/p CE/IOL (Dr.S. Groat)  - IOL in good position, doing well  - monitor  Ophthalmic Meds Ordered this visit:  No orders of the defined types were placed in this encounter.    Return in about 4 months (around 10/06/2024) for f/u non-exu ARMD OU, DFE, OCT.  There are no Patient Instructions on file for this visit.  Explained the diagnoses, plan, and follow up with the patient and they  expressed understanding.  Patient expressed understanding of the importance of proper follow up care.   This document serves as a record of services personally performed by Redell JUDITHANN Hans, MD, PhD. It was created on their behalf by Avelina Pereyra, COA an ophthalmic technician. The creation of this record is the provider's dictation and/or activities during the visit.   Electronically signed by: Avelina GORMAN Pereyra, COT  06/11/24  2:14 AM   This document serves as a record of services personally performed by Redell JUDITHANN Hans, MD, PhD. It was created on their behalf by Alan PARAS. Delores, OA an ophthalmic technician. The creation of this record is the provider's dictation and/or activities during the visit.    Electronically signed by: Alan PARAS. Delores, OA 06/11/24 2:14 AM  Redell JUDITHANN Hans, M.D., Ph.D. Diseases & Surgery of the Retina and Vitreous [facility] 06/06/2024  I have reviewed the above documentation for accuracy and completeness, and I agree with the above. Redell JUDITHANN Hans, M.D., Ph.D. 06/11/24 2:17 AM   Abbreviations: M myopia (nearsighted); A astigmatism; H hyperopia (farsighted); P presbyopia; Mrx spectacle prescription;  CTL contact lenses; OD right eye; OS left eye; OU both eyes  XT exotropia; ET esotropia; PEK punctate epithelial keratitis; PEE punctate epithelial erosions; DES dry eye syndrome; MGD meibomian gland dysfunction; ATs artificial tears; PFAT's preservative free artificial tears; NSC nuclear sclerotic cataract; PSC posterior subcapsular cataract; ERM epi-retinal membrane; PVD posterior vitreous detachment; RD retinal detachment; DM diabetes mellitus; DR diabetic retinopathy; NPDR non-proliferative diabetic retinopathy; PDR proliferative diabetic retinopathy; CSME clinically significant macular edema; DME diabetic macular edema; dbh dot blot hemorrhages; CWS cotton wool spot; POAG primary open angle glaucoma; C/D cup-to-disc ratio; HVF humphrey visual field;  GVF goldmann visual field; OCT optical coherence tomography; IOP intraocular pressure; BRVO Branch retinal vein occlusion; CRVO central retinal vein occlusion; CRAO central retinal artery occlusion; BRAO branch retinal artery occlusion; RT retinal tear; SB scleral buckle; PPV pars plana vitrectomy; VH Vitreous hemorrhage; PRP panretinal laser photocoagulation; IVK intravitreal kenalog ; VMT vitreomacular traction; MH Macular hole;  NVD neovascularization of the disc; NVE neovascularization elsewhere; AREDS age related eye disease study; ARMD age related macular degeneration; POAG primary open angle glaucoma; EBMD epithelial/anterior basement membrane dystrophy; ACIOL anterior chamber intraocular lens; IOL intraocular lens; PCIOL posterior chamber intraocular lens; Phaco/IOL phacoemulsification with intraocular lens placement; PRK photorefractive keratectomy; LASIK laser assisted in situ keratomileusis; HTN hypertension; DM diabetes mellitus; COPD chronic obstructive pulmonary disease

## 2024-05-31 ENCOUNTER — Other Ambulatory Visit: Payer: Self-pay | Admitting: Internal Medicine

## 2024-05-31 DIAGNOSIS — I10 Essential (primary) hypertension: Secondary | ICD-10-CM

## 2024-06-06 ENCOUNTER — Encounter (INDEPENDENT_AMBULATORY_CARE_PROVIDER_SITE_OTHER): Payer: Self-pay | Admitting: Ophthalmology

## 2024-06-06 ENCOUNTER — Ambulatory Visit (INDEPENDENT_AMBULATORY_CARE_PROVIDER_SITE_OTHER): Admitting: Ophthalmology

## 2024-06-06 DIAGNOSIS — Z961 Presence of intraocular lens: Secondary | ICD-10-CM

## 2024-06-06 DIAGNOSIS — H353134 Nonexudative age-related macular degeneration, bilateral, advanced atrophic with subfoveal involvement: Secondary | ICD-10-CM

## 2024-06-06 DIAGNOSIS — I10 Essential (primary) hypertension: Secondary | ICD-10-CM | POA: Diagnosis not present

## 2024-06-06 DIAGNOSIS — H353133 Nonexudative age-related macular degeneration, bilateral, advanced atrophic without subfoveal involvement: Secondary | ICD-10-CM

## 2024-06-06 DIAGNOSIS — H35033 Hypertensive retinopathy, bilateral: Secondary | ICD-10-CM

## 2024-06-06 DIAGNOSIS — Z7984 Long term (current) use of oral hypoglycemic drugs: Secondary | ICD-10-CM | POA: Diagnosis not present

## 2024-06-06 DIAGNOSIS — E119 Type 2 diabetes mellitus without complications: Secondary | ICD-10-CM

## 2024-06-06 DIAGNOSIS — H3581 Retinal edema: Secondary | ICD-10-CM

## 2024-06-07 ENCOUNTER — Telehealth: Payer: Self-pay | Admitting: Pharmacy Technician

## 2024-06-07 ENCOUNTER — Other Ambulatory Visit (HOSPITAL_COMMUNITY): Payer: Self-pay

## 2024-06-07 NOTE — Telephone Encounter (Signed)
 Pharmacy Patient Advocate Encounter  Received notification from [facility] that Prior Authorization for dilTIAZem  HCl ER Coated Beads 360MG  er capsules has been APPROVED from 11/03/23 to 06/07/25. Ran test claim, Copay is $2.21- 1 month. This test claim was processed through [facility]- copay amounts may vary at other pharmacies due to pharmacy/plan contracts, or as the patient moves through the different stages of their insurance plan.   PA #/Case ID/Reference #: [id number]

## 2024-06-07 NOTE — Telephone Encounter (Signed)
 Pharmacy Patient Advocate Encounter   Received notification from Physician's Office that prior authorization for dilTIAZem  HCl ER Coated Beads 360MG  er capsules is required/requested.   Insurance verification completed.   The patient is insured through [employer] .   Per test claim: PA required; PA submitted to above mentioned insurance via CoverMyMeds Key/confirmation #/EOC [id number] Status is pending

## 2024-06-11 ENCOUNTER — Encounter (INDEPENDENT_AMBULATORY_CARE_PROVIDER_SITE_OTHER): Payer: Self-pay | Admitting: Ophthalmology

## 2024-07-02 ENCOUNTER — Other Ambulatory Visit: Payer: Self-pay | Admitting: Cardiology

## 2024-07-05 ENCOUNTER — Encounter: Payer: Self-pay | Admitting: Cardiology

## 2024-07-05 ENCOUNTER — Ambulatory Visit: Attending: Cardiology | Admitting: Cardiology

## 2024-07-05 VITALS — BP 114/76 | HR 80 | Ht 66.0 in | Wt 201.0 lb

## 2024-07-05 DIAGNOSIS — I480 Paroxysmal atrial fibrillation: Secondary | ICD-10-CM | POA: Insufficient documentation

## 2024-07-05 DIAGNOSIS — Z8679 Personal history of other diseases of the circulatory system: Secondary | ICD-10-CM | POA: Insufficient documentation

## 2024-07-05 DIAGNOSIS — I10 Essential (primary) hypertension: Secondary | ICD-10-CM | POA: Insufficient documentation

## 2024-07-05 DIAGNOSIS — Z9889 Other specified postprocedural states: Secondary | ICD-10-CM | POA: Insufficient documentation

## 2024-07-05 DIAGNOSIS — Z7901 Long term (current) use of anticoagulants: Secondary | ICD-10-CM | POA: Insufficient documentation

## 2024-07-05 NOTE — Progress Notes (Unsigned)
 Cardiology Office Note:    Date:  07/05/2024   ID:  [name], DOB [birth year], MRN [id number]  PCP:  Rollene Almarie LABOR, MD  Cardiologist:  Lamar Fitch, MD    Referring MD: Rollene Almarie LABOR, *   Chief Complaint  Patient presents with   Follow-up    History of Present Illness:    Tara [name] is a 73 y.o. female past medical history significant for proximal mitral fibrillation, anticoagulated, history of CVA, history of neurosarcoidosis, essential hypertension, dyslipidemia, status post atrial fibrillation ablation done few months ago.  Comes today to my office for follow-up overall very happy and satisfied results of her ablation.  Denies having a palpitation does what she wants to do with no difficulties  Past Medical History:  Diagnosis Date   Hypertension    Sarcoidosis    Stroke [facility])     Past Surgical History:  Procedure Laterality Date   APPLICATION OF CRANIAL NAVIGATION N/A 01/04/2018   Procedure: APPLICATION OF CRANIAL NAVIGATION;  Surgeon: Unice Pac, MD;  Location: [facility] OR;  Service: Neurosurgery;  Laterality: N/A;   ATRIAL FIBRILLATION ABLATION N/A 04/27/2024   Procedure: ATRIAL FIBRILLATION ABLATION;  Surgeon: Inocencio Soyla Lunger, MD;  Location: [facility] INVASIVE CV LAB;  Service: Cardiovascular;  Laterality: N/A;   BRAIN SURGERY     COLONOSCOPY WITH PROPOFOL  N/A 11/20/2021   Procedure: COLONOSCOPY WITH PROPOFOL ;  Surgeon: Mansouraty, Aloha Raddle., MD;  Location: [facility] ENDOSCOPY;  Service: Gastroenterology;  Laterality: N/A;   HEMOSTASIS CLIP PLACEMENT  11/20/2021   Procedure: HEMOSTASIS CLIP PLACEMENT;  Surgeon: Wilhelmenia Aloha Raddle., MD;  Location: [facility] ENDOSCOPY;  Service: Gastroenterology;;   POLYPECTOMY  11/20/2021   Procedure: POLYPECTOMY;  Surgeon: Wilhelmenia Aloha Raddle., MD;  Location: [facility] ENDOSCOPY;  Service: Gastroenterology;;   PR DURAL GRAFT SPINAL Left 01/04/2018   Procedure: Left Pterional craniotomy for biopsy with Brainlab;  Surgeon:  Unice Pac, MD;  Location: [facility] OR;  Service: Neurosurgery;  Laterality: Left;  Left Pterional craniotomy for biopsy with Brainlab   SCLEROTHERAPY  11/20/2021   Procedure: SCLEROTHERAPY;  Surgeon: Mansouraty, Aloha Raddle., MD;  Location: [facility] ENDOSCOPY;  Service: Gastroenterology;;    Current Medications: Current Meds  Medication Sig   alendronate  (FOSAMAX ) 70 MG tablet TAKE 1 TABLET BY MOUTH ONCE WEEKLY WITH A FULL GLASS OF WATER ON AN EMPTY STOMACH   apixaban  (ELIQUIS ) 5 MG TABS tablet Take 1 tablet (5 mg total) by mouth 2 (two) times daily.   aspirin  EC 81 MG tablet Take 1 tablet (81 mg total) by mouth daily.   diltiazem  (CARDIZEM  CD) 180 MG 24 hr capsule Take 2 capsules (360 mg total) by mouth daily.   hydrochlorothiazide  (HYDRODIURIL ) 25 MG tablet TAKE 1 TABLET BY MOUTH DAILY   metFORMIN  (GLUCOPHAGE ) 500 MG tablet TAKE 1 TABLET BY MOUTH DAILY WITH BREAKFAST   metoprolol  tartrate (LOPRESSOR ) 25 MG tablet TAKE 1 TABLET BY MOUTH AS NEEDED FOR HEART RATE GREATER THEN 100)   Multiple Vitamin (MULTIVITAMIN WITH MINERALS) TABS tablet Take 1 tablet by mouth daily.   Multiple Vitamins-Minerals (PRESERVISION AREDS PO) Take 1 capsule by mouth in the morning and at bedtime. Unknown strenght   rosuvastatin  (CRESTOR ) 10 MG tablet TAKE 1 TABLET BY MOUTH DAILY   valsartan  (DIOVAN ) 40 MG tablet TAKE 1 TABLET BY MOUTH DAILY     Allergies:   Ace inhibitors and Ether   Social History   Socioeconomic History   Marital status: Married    Spouse name: [name]   Number of  children: 4   Years of education: Not on file   Highest education level: Not on file  Occupational History   Occupation: Retired  Tobacco Use   Smoking status: Former    Current packs/day: 0.00    Types: Cigarettes    Quit date: 12/31/2017    Years since quitting: 6.5   Smokeless tobacco: Never   Tobacco comments:    Former smoker 05/25/24  Vaping Use   Vaping status: Never Used  Substance and Sexual Activity   Alcohol use: No    Drug use: No   Sexual activity: Yes  Other Topics Concern   Not on file  Social History Narrative   Lives with husband.  2 dogs.  4 children and 10 grandchildren.    Social Drivers of Corporate investment banker Strain: Low Risk  (08/03/2022)   Overall Financial Resource Strain (CARDIA)    Difficulty of Paying Living Expenses: Not hard at all  Food Insecurity: No Food Insecurity (08/03/2022)   Hunger Vital Sign    Worried About Running Out of Food in the Last Year: Never true    Ran Out of Food in the Last Year: Never true  Transportation Needs: No Transportation Needs (08/06/2023)   PRAPARE - Administrator, Civil Service (Medical): No    Lack of Transportation (Non-Medical): No  Physical Activity: Sufficiently Active (08/06/2023)   Exercise Vital Sign    Days of Exercise per Week: 4 days    Minutes of Exercise per Session: 50 min  Stress: No Stress Concern Present (08/06/2023)   [employer] of Occupational Health - Occupational Stress Questionnaire    Feeling of Stress : Not at all  Social Connections: Moderately Isolated (08/06/2023)   Social Connection and Isolation Panel    Frequency of Communication with Friends and Family: More than three times a week    Frequency of Social Gatherings with Friends and Family: More than three times a week    Attends Religious Services: Never    Database administrator or Organizations: No    Attends Engineer, structural: Never    Marital Status: Married     Family History: The patient's family history includes Asthma in her mother; CAD in her father; Cancer in her father; Colon cancer in her father and mother; Diabetes in her brother, father, and sister; Heart disease in her father and mother; Heart failure in her mother; Hypertension in her father; Macular degeneration in her sister. ROS:   Please see the history of present illness.    All 14 point review of systems negative except as described per history of  present illness  EKGs/Labs/Other Studies Reviewed:         Recent Labs: 01/04/2024: ALT 23 03/29/2024: BUN 16; Creatinine, Ser 0.69; Hemoglobin 12.1; Platelets 369; Potassium 4.7; Sodium 137  Recent Lipid Panel    Component Value Date/Time   CHOL 121 01/04/2024 1619   CHOL 226 (H) 12/10/2020 1019   TRIG 65.0 01/04/2024 1619   HDL 56.70 01/04/2024 1619   HDL 62 12/10/2020 1019   CHOLHDL 2 01/04/2024 1619   VLDL 13.0 01/04/2024 1619   LDLCALC 52 01/04/2024 1619   LDLCALC 147 (H) 12/10/2020 1019    Physical Exam:    VS:  BP 114/76   Pulse 80   Ht 5' 6 (1.676 m)   Wt 201 lb (91.2 kg)   SpO2 96%   BMI 32.44 kg/m     Wt Readings from Last  3 Encounters:  07/05/24 201 lb (91.2 kg)  05/25/24 198 lb (89.8 kg)  04/27/24 194 lb (88 kg)     GEN:  Well nourished, well developed in no acute distress HEENT: Normal NECK: No JVD; No carotid bruits LYMPHATICS: No lymphadenopathy CARDIAC: RRR, no murmurs, no rubs, no gallops RESPIRATORY:  Clear to auscultation without rales, wheezing or rhonchi  ABDOMEN: Soft, non-tender, non-distended MUSCULOSKELETAL:  No edema; No deformity  SKIN: Warm and dry LOWER EXTREMITIES: no swelling NEUROLOGIC:  Alert and oriented x 3 PSYCHIATRIC:  Normal affect   ASSESSMENT:    1. Paroxysmal atrial fibrillation (HCC)   2. Primary hypertension   3. Anticoagulant long-term use   4. Status post ablation of atrial fibrillation    PLAN:    In order of problems listed above:  Paroxysmal atrial fibrillation doing well continue anticoagulation we discussed the issue and I stressed importance of taking this medication. Essential hypertension blood pressure well-controlled continue present management. Anticoagulation related to atrial fibrillation ablation which we will continue. Dyslipidemia I did review K PN which show me LDL 52 HDL 56 good control continue present management   Medication Adjustments/Labs and Tests Ordered: Current medicines are  reviewed at length with the patient today.  Concerns regarding medicines are outlined above.  No orders of the defined types were placed in this encounter.  Medication changes: No orders of the defined types were placed in this encounter.   Signed, Lamar DOROTHA Fitch, MD, [facility] 07/05/2024 1:17 PM    [facility]

## 2024-07-05 NOTE — Patient Instructions (Signed)

## 2024-07-13 ENCOUNTER — Other Ambulatory Visit: Payer: Self-pay | Admitting: Internal Medicine

## 2024-07-13 DIAGNOSIS — E66811 Obesity, class 1: Secondary | ICD-10-CM

## 2024-07-13 DIAGNOSIS — R7303 Prediabetes: Secondary | ICD-10-CM

## 2024-07-18 ENCOUNTER — Ambulatory Visit (HOSPITAL_COMMUNITY): Admitting: Internal Medicine

## 2024-07-19 ENCOUNTER — Ambulatory Visit (HOSPITAL_COMMUNITY)
Admission: RE | Admit: 2024-07-19 | Discharge: 2024-07-19 | Disposition: A | Discharge status: Home / Self Care | Source: Ambulatory Visit | Attending: Physician Assistant | Admitting: Physician Assistant

## 2024-07-19 ENCOUNTER — Encounter (HOSPITAL_COMMUNITY): Payer: Self-pay | Admitting: Physician Assistant

## 2024-07-19 VITALS — BP 124/80 | HR 68 | Ht 66.0 in | Wt 200.8 lb

## 2024-07-19 DIAGNOSIS — D6869 Other thrombophilia: Secondary | ICD-10-CM | POA: Diagnosis not present

## 2024-07-19 DIAGNOSIS — Z9889 Other specified postprocedural states: Secondary | ICD-10-CM | POA: Diagnosis not present

## 2024-07-19 DIAGNOSIS — Z8679 Personal history of other diseases of the circulatory system: Secondary | ICD-10-CM | POA: Insufficient documentation

## 2024-07-19 DIAGNOSIS — I480 Paroxysmal atrial fibrillation: Secondary | ICD-10-CM | POA: Insufficient documentation

## 2024-07-19 NOTE — Progress Notes (Signed)
 Primary Care Physician: Rollene Almarie LABOR, MD Primary Cardiologist: Lamar Fitch, MD Electrophysiologist: Will Gladis Norton, MD  Referring Physician: Dr Norton Coolidge JINNY Tara [name] is a 73 y.o. female with a history of CVA, CAD, neurosarcoidosis, HTN, HLD, atrial fibrillation who presents for follow up in the [facility].  She was seen in the emergency room 01/05/2023 with atrial fibrillation and rapid rates. She was hypotensive at this time. She self converted to sinus rhythm en route to the hospital. She was seen by Dr Norton and underwent afib ablation on 04/27/24. Patient is on Eliquis  for stroke prevention.    Patient returns for follow up for atrial fibrillation. She remains in SR today and feels well. No interim symptoms of afib. No bleeding issues on anticoagulation.   Today, she  denies symptoms of palpitations, chest pain, shortness of breath, orthopnea, PND, lower extremity edema, dizziness, presyncope, syncope, snoring, daytime somnolence, bleeding, or neurologic sequela. The patient is tolerating medications without difficulties and is otherwise without complaint today.    Atrial Fibrillation Risk Factors:  she does not have symptoms or diagnosis of sleep apnea. she does not have a history of rheumatic fever.   Atrial Fibrillation Management history:  Previous antiarrhythmic drugs: none Previous cardioversions: none Previous ablations: none Anticoagulation history: Eliquis   ROS- All systems are reviewed and negative except as per the HPI above.  Past Medical History:  Diagnosis Date   Hypertension    Sarcoidosis    Stroke [facility])     Current Outpatient Medications  Medication Sig Dispense Refill   alendronate  (FOSAMAX ) 70 MG tablet TAKE 1 TABLET BY MOUTH ONCE WEEKLY WITH A FULL GLASS OF WATER ON AN EMPTY STOMACH 12 tablet 3   apixaban  (ELIQUIS ) 5 MG TABS tablet Take 1 tablet (5 mg total) by mouth 2 (two) times daily. 180  tablet 1   aspirin  EC 81 MG tablet Take 1 tablet (81 mg total) by mouth daily.     diltiazem  (CARDIZEM  CD) 180 MG 24 hr capsule Take 2 capsules (360 mg total) by mouth daily. 180 capsule 3   hydrochlorothiazide  (HYDRODIURIL ) 25 MG tablet TAKE 1 TABLET BY MOUTH DAILY 90 tablet 0   metFORMIN  (GLUCOPHAGE ) 500 MG tablet TAKE 1 TABLET BY MOUTH DAILY WITH BREAKFAST 90 tablet 0   metoprolol  tartrate (LOPRESSOR ) 25 MG tablet TAKE 1 TABLET BY MOUTH AS NEEDED FOR HEART RATE GREATER THEN 100) 90 tablet 1   Multiple Vitamin (MULTIVITAMIN WITH MINERALS) TABS tablet Take 1 tablet by mouth daily.     Multiple Vitamins-Minerals (PRESERVISION AREDS PO) Take 1 capsule by mouth in the morning and at bedtime. Unknown strenght     rosuvastatin  (CRESTOR ) 10 MG tablet TAKE 1 TABLET BY MOUTH DAILY 90 tablet 1   valsartan  (DIOVAN ) 40 MG tablet TAKE 1 TABLET BY MOUTH DAILY 90 tablet 0   No current facility-administered medications for this encounter.    Physical Exam: BP 124/80   Pulse 68   Ht 5' 6 (1.676 m)   Wt 91.1 kg   BMI 32.41 kg/m   GEN: Well nourished, well developed in no acute distress CARDIAC: Regular rate and rhythm, no murmurs, rubs, gallops RESPIRATORY:  Clear to auscultation without rales, wheezing or rhonchi  ABDOMEN: Soft, non-tender, non-distended EXTREMITIES:  No edema; No deformity    Wt Readings from Last 3 Encounters:  07/19/24 91.1 kg  07/05/24 91.2 kg  05/25/24 89.8 kg     EKG today demonstrates  SR Vent.  rate 68 BPM PR interval 174 ms QRS duration 70 ms QT/QTcB 400/425 ms   Echo 11/26/22 demonstrated   1. Left ventricular ejection fraction, by estimation, is 60 to 65%. The  left ventricle has normal function. The left ventricle has no regional  wall motion abnormalities. There is moderate concentric left ventricular  hypertrophy. Left ventricular diastolic parameters are consistent with Grade I diastolic dysfunction (impaired relaxation). The average left ventricular  global longitudinal strain is -19.0 %. The global longitudinal strain is normal.   2. Right ventricular systolic function is normal. The right ventricular  size is normal. There is normal pulmonary artery systolic pressure. The  estimated right ventricular systolic pressure is 25.8 mmHg.   3. The mitral valve is normal in structure. Trivial mitral valve  regurgitation. No evidence of mitral stenosis.   4. The aortic valve is normal in structure. Aortic valve regurgitation is  not visualized. Aortic valve sclerosis/calcification is present, without  any evidence of aortic stenosis.   5. The inferior vena cava is normal in size with greater than 50%  respiratory variability, suggesting right atrial pressure of 3 mmHg.    CHA2DS2-VASc Score = 6  The patient's score is based upon: CHF History: 0 HTN History: 1 Diabetes History: 1 Stroke History: 2 Vascular Disease History: 0 Age Score: 1 Gender Score: 1       ASSESSMENT AND PLAN: Paroxysmal Atrial Fibrillation (ICD10:  I48.0) The patient's CHA2DS2-VASc score is 6, indicating a 9.7% annual risk of stroke.   S/p afib ablation 04/27/24 Patient appears to be maintaining SR Continue Eliquis  5 mg BID Continue diltiazem  360 mg daily (two 180 mg tablets) Continue Lopressor  25 mg PRN for heart racing.   Secondary Hypercoagulable State (ICD10:  D68.69) The patient is at significant risk for stroke/thromboembolism based upon her CHA2DS2-VASc Score of 6.  Continue Apixaban  (Eliquis ). No bleeding issues.   HTN Stable on current regimen  CAD No anginal symptoms Followed by Dr Bernie   Follow up with Dr Inocencio in 6 months.     [facility] [address] [place], [place] [address] [phone]

## 2024-07-25 ENCOUNTER — Other Ambulatory Visit: Payer: Self-pay | Admitting: Cardiology

## 2024-07-25 DIAGNOSIS — I480 Paroxysmal atrial fibrillation: Secondary | ICD-10-CM

## 2024-07-25 NOTE — Telephone Encounter (Signed)
 Prescription refill request for Eliquis  received. Indication:afib Last office visit:9/25 Scr:0.69  5/25 Age: 73 Weight:91.1  kg  Prescription refilled

## 2024-08-08 ENCOUNTER — Ambulatory Visit: Payer: Self-pay

## 2024-08-08 ENCOUNTER — Encounter: Payer: Self-pay | Admitting: Family Medicine

## 2024-08-08 ENCOUNTER — Ambulatory Visit: Admitting: Family Medicine

## 2024-08-08 VITALS — BP 138/86 | HR 88 | Temp 98.3°F | Ht 66.0 in | Wt 196.2 lb

## 2024-08-08 DIAGNOSIS — M542 Cervicalgia: Secondary | ICD-10-CM

## 2024-08-08 DIAGNOSIS — T148XXA Other injury of unspecified body region, initial encounter: Secondary | ICD-10-CM

## 2024-08-08 MED ORDER — TRAMADOL HCL 50 MG PO TABS: 50.0000 mg | ORAL_TABLET | Freq: Three times a day (TID) | ORAL | 0 refills | Status: DC | PRN

## 2024-08-08 MED ORDER — TIZANIDINE HCL 4 MG PO TABS: 4.0000 mg | ORAL_TABLET | Freq: Four times a day (QID) | ORAL | 0 refills | Status: DC | PRN

## 2024-08-08 NOTE — Progress Notes (Signed)
 Acute Office Visit  Subjective:     Patient ID: Tara [name], female    DOB: 03-13-1951, 73 y.o.   MRN: [id number]  Chief Complaint  Patient presents with   Acute Visit    Neck pain ongoing since Sunday, thinks she may have slept wrong. Has not tried any medications due to possible medication interactions    HPI  Discussed the use of AI scribe software for clinical note transcription with the patient, who gave verbal consent to proceed.  History of Present Illness Tara [name] is a 73 year old female with a history of stroke who presents with severe neck pain.  Neck pain - Severe neck pain onset since Sunday night - Pain is intolerable and significantly disrupts sleep, allowing only a couple of hours of rest per night - Pain interferes with ability to drive - Typically sleeps five to six hours per night, but current pain has altered sleep pattern - Uses Tylenol  for pain management  Antithrombotic therapy - Takes daily aspirin  and Eliquis      ROS Per HPI      Objective:    BP 138/86 (BP Location: Left Arm, Patient Position: Sitting)   Pulse 88   Temp 98.3 F (36.8 C) (Temporal)   Ht 5' 6 (1.676 m)   Wt 196 lb 3.2 oz (89 kg)   SpO2 94%   BMI 31.67 kg/m    Physical Exam Vitals and nursing note reviewed.  Constitutional:      General: She is not in acute distress.    Appearance: Normal appearance. She is normal weight.  HENT:     Head: Normocephalic and atraumatic.     Right Ear: External ear normal.     Left Ear: External ear normal.     Nose: Nose normal.     Mouth/Throat:     Mouth: Mucous membranes are moist.     Pharynx: Oropharynx is clear.  Eyes:     Extraocular Movements: Extraocular movements intact.     Pupils: Pupils are equal, round, and reactive to light.  Cardiovascular:     Rate and Rhythm: Normal rate and regular rhythm.     Pulses: Normal pulses.     Heart sounds: Normal heart sounds.  Pulmonary:     Effort:  Pulmonary effort is normal. No respiratory distress.     Breath sounds: Normal breath sounds. No wheezing, rhonchi or rales.  Musculoskeletal:       Arms:     Cervical back: Normal range of motion.     Right lower leg: No edema.     Left lower leg: No edema.     Comments: Limited range of motion to the neck, tenderness to bilateral posterior neck muscles, muscles and spasm, mildly swollen.  No erythema, no bruising, no obvious deformity  Lymphadenopathy:     Cervical: No cervical adenopathy.  Neurological:     General: No focal deficit present.     Mental Status: She is alert and oriented to person, place, and time.  Psychiatric:        Mood and Affect: Mood normal.        Thought Content: Thought content normal.     No results found for any visits on 08/08/24.      Assessment & Plan:   Assessment and Plan Assessment & Plan Acute neck pain (cervicalgia), muscle strain Severe acute neck pain with significant discomfort and sleep disturbance, exacerbated by movement. Discussed potential medication interactions, tramadol considered  safer for pain management. - Prescribe muscle relaxant, caution for drowsiness. - Recommend topical treatments like Salonpas or Icy Hot. - Prescribe tramadol, monitor for constipation, adjust dosage as needed. - Advise against aspirin  with Eliquis  to prevent excessive anticoagulation. - Provide exercises post-pain relief, apply heat before exercises. - Instruct on heat application or warm showers to loosen muscles. - Monitor symptoms for a week, follow up if no improvement.     No orders of the defined types were placed in this encounter.    Meds ordered this encounter  Medications   tiZANidine (ZANAFLEX) 4 MG tablet    Sig: Take 1 tablet (4 mg total) by mouth every 6 (six) hours as needed for muscle spasms.    Dispense:  30 tablet    Refill:  0   DISCONTD: traMADol (ULTRAM) 50 MG tablet    Sig: Take 1 tablet (50 mg total) by mouth every 8  (eight) hours as needed for up to 5 days.    Dispense:  15 tablet    Refill:  0    Return if symptoms worsen or fail to improve.  Corean LITTIE Ku, FNP

## 2024-08-08 NOTE — Patient Instructions (Signed)
 May use heat or ice to the area for relief as needed.  May use topical rubs or gels to the area as needed for relief.  Attached subtle stretching exercises to help relieve pain and strengthen muscles.   I have sent in a muscle relaxer for you to use one tablet as needed for muscle spasms. This medication can make you sleepy. Do not drive until you know how this medication affects you.  I have also sent in tramadol for you to take 1 tablet twice a day as needed for pain.  This medication can also make you sleepy sometimes.  Take this at night for the first time when you take it, or make sure that you do not have to drive or be anywhere.  Follow-up with me for new or worsening symptoms.

## 2024-08-08 NOTE — Telephone Encounter (Signed)
 FYI Only or Action Required?: Action required by provider: request for appointment.  Patient was last seen in primary care on 01/27/2024 by Rollene Almarie LABOR, MD.  Called Nurse Triage reporting Neck Pain.  Symptoms began several days ago.  Interventions attempted: Rest, hydration, or home remedies.  Symptoms are: gradually worsening.  Triage Disposition: See HCP Within 4 Hours (Or PCP Triage)  Patient/caregiver understands and will follow disposition?: YesCopied from CRM #[id number]. Topic: Clinical - Red Word Triage >> Aug 08, 2024  9:57 AM Alfonso HERO wrote: Red Word that prompted transfer to Nurse Triage: went to the beach now she's having bad pain in her neck and back Reason for Disposition  [1] SEVERE neck pain (e.g., excruciating, unable to do any normal activities) AND [2] not improved after 2 hours of pain medicine  Answer Assessment - Initial Assessment Questions  I can't turn my head left or right. I haven't taken any medication because I don't know what to take with all my health issues.     1. ONSET: When did the pain begin?      Sunday morning  2. LOCATION: Where does it hurt?      Both side  3. PATTERN Does the pain come and go, or has it been constant since it started?      Constant  4. SEVERITY: How bad is the pain?  (Scale 0-10; or none or slight stiffness, mild, moderate, severe)     severe 5. RADIATION: Does the pain go anywhere else, shoot into your arms?     denies 6. CORD SYMPTOMS: Any weakness or numbness of the arms or legs?     denies 7. CAUSE: What do you think is causing the neck pain?     Slept wrong 8. NECK OVERUSE: Any recent activities that involved turning or twisting the neck?     na 9. OTHER SYMPTOMS: Do you have any other symptoms? (e.g., headache, fever, chest pain, difficulty breathing, neck swelling)     denies  Protocols used: Neck Pain or Stiffness-A-AH

## 2024-08-09 ENCOUNTER — Ambulatory Visit: Payer: Self-pay

## 2024-08-09 ENCOUNTER — Emergency Department (HOSPITAL_COMMUNITY)
Admission: EM | Admit: 2024-08-09 | Discharge: 2024-08-10 | Disposition: A | Discharge status: Home / Self Care | Attending: Emergency Medicine | Admitting: Emergency Medicine

## 2024-08-09 ENCOUNTER — Other Ambulatory Visit: Payer: Self-pay

## 2024-08-09 ENCOUNTER — Telehealth: Payer: Self-pay | Admitting: *Deleted

## 2024-08-09 ENCOUNTER — Encounter (HOSPITAL_COMMUNITY): Payer: Self-pay | Admitting: Emergency Medicine

## 2024-08-09 DIAGNOSIS — M542 Cervicalgia: Secondary | ICD-10-CM | POA: Insufficient documentation

## 2024-08-09 DIAGNOSIS — I480 Paroxysmal atrial fibrillation: Secondary | ICD-10-CM | POA: Diagnosis not present

## 2024-08-09 DIAGNOSIS — Z7901 Long term (current) use of anticoagulants: Secondary | ICD-10-CM | POA: Insufficient documentation

## 2024-08-09 DIAGNOSIS — Z7982 Long term (current) use of aspirin: Secondary | ICD-10-CM | POA: Insufficient documentation

## 2024-08-09 DIAGNOSIS — Z79899 Other long term (current) drug therapy: Secondary | ICD-10-CM | POA: Diagnosis not present

## 2024-08-09 DIAGNOSIS — R519 Headache, unspecified: Secondary | ICD-10-CM | POA: Insufficient documentation

## 2024-08-09 DIAGNOSIS — I10 Essential (primary) hypertension: Secondary | ICD-10-CM | POA: Insufficient documentation

## 2024-08-09 MED ORDER — MORPHINE SULFATE (PF) 4 MG/ML IV SOLN
4.0000 mg | Freq: Once | INTRAVENOUS | Status: AC
  Administered 2024-08-10: 4 mg via INTRAVENOUS
  Filled 2024-08-09: qty 1

## 2024-08-09 NOTE — ED Provider Notes (Signed)
 [place] [facility]GENCY DEPARTMENT AT [facility] Provider Note   CSN: [id number] Arrival date & time: 08/09/24  1742     Patient presents with: Headache and Neck Pain   Tara [name] is a 73 y.o. female.  {Add pertinent medical, surgical, social history, OB history to YEP:67052} The history is provided by the patient.  Headache Associated symptoms: neck pain   Neck Pain Associated symptoms: headaches    She has a history of hypertension, paroxysmal atrial fibrillation anticoagulated on apixaban , neurosarcoidosis, stroke and comes in because of headache and stiff neck for the last 3 days.  She had onset 3 days ago of an occipital headache with some radiation to the frontal area and with associated neck stiffness.  This is what she has had with prior flareups of sarcoidosis.  She did see her primary care provider who prescribed tramadol and a muscle relaxer but they have not been helping.  She denies fever or chills.  She denies any visual changes.  She denies any weakness, numbness, tingling.  Denies nausea or vomiting.  She called her neuro oncologist and was advised to come to the emergency department to get an MRI.    Prior to Admission medications   Medication Sig Start Date End Date Taking? Authorizing Provider  alendronate  (FOSAMAX ) 70 MG tablet TAKE 1 TABLET BY MOUTH ONCE WEEKLY WITH A FULL GLASS OF WATER ON AN EMPTY STOMACH 03/31/24   Rollene Almarie LABOR, MD  aspirin  EC 81 MG tablet Take 1 tablet (81 mg total) by mouth daily. 01/21/18   Vaslow, Zachary K, MD  diltiazem  (CARDIZEM  CD) 180 MG 24 hr capsule Take 2 capsules (360 mg total) by mouth daily. 12/13/23 08/08/24  Krasowski, Robert J, MD  ELIQUIS  5 MG TABS tablet TAKE 1 TABLET BY MOUTH 2 TIMES A DAY 07/25/24   Krasowski, Robert J, MD  hydrochlorothiazide  (HYDRODIURIL ) 25 MG tablet TAKE 1 TABLET BY MOUTH DAILY 06/01/24   Rollene Almarie LABOR, MD  metFORMIN  (GLUCOPHAGE ) 500 MG tablet TAKE 1 TABLET BY MOUTH DAILY WITH  BREAKFAST 07/14/24   Rollene Almarie LABOR, MD  metoprolol  tartrate (LOPRESSOR ) 25 MG tablet TAKE 1 TABLET BY MOUTH AS NEEDED FOR HEART RATE GREATER THEN 100) 07/04/24   Carlin Delon BROCKS, NP  Multiple Vitamin (MULTIVITAMIN WITH MINERALS) TABS tablet Take 1 tablet by mouth daily.    [provider]  Multiple Vitamins-Minerals (PRESERVISION AREDS PO) Take 1 capsule by mouth in the morning and at bedtime. Unknown strenght    [provider]  rosuvastatin  (CRESTOR ) 10 MG tablet TAKE 1 TABLET BY MOUTH DAILY 03/02/24   Rollene Almarie LABOR, MD  tiZANidine (ZANAFLEX) 4 MG tablet Take 1 tablet (4 mg total) by mouth every 6 (six) hours as needed for muscle spasms. 08/08/24   Ku Corean CROME, FNP  traMADol (ULTRAM) 50 MG tablet Take 1 tablet (50 mg total) by mouth every 8 (eight) hours as needed for up to 5 days. 08/08/24 08/13/24  Ku Corean CROME, FNP  valsartan  (DIOVAN ) 40 MG tablet TAKE 1 TABLET BY MOUTH DAILY 05/23/24   Rollene Almarie LABOR, MD    Allergies: Ace inhibitors and Ether    Review of Systems  Musculoskeletal:  Positive for neck pain.  Neurological:  Positive for headaches.  All other systems reviewed and are negative.   Updated Vital Signs BP 132/86   Pulse 72   Temp 98 F (36.7 C) (Oral)   Resp 18   SpO2 96%   Physical Exam Vitals  and nursing note reviewed.   73 year old female, resting comfortably and in no acute distress. Vital signs are normal. Oxygen saturation is 96%, which is normal. Head is normocephalic and atraumatic. PERRLA, EOMI.  Neck is nontender and supple. Back is nontender. Lungs are clear without rales, wheezes, or rhonchi. Chest is nontender. Heart has regular rate and rhythm without murmur. Abdomen is soft, flat, nontender. Extremities have no cyanosis or edema, full range of motion is present. Skin is warm and dry without rash. Neurologic: Mental status is normal, cranial nerves are intact, strength is 5/5 in all 4  extremities, station and gait are normal, sensation is normal.  (all labs ordered are listed, but only abnormal results are displayed) Labs Reviewed - No data to display  EKG: None  Radiology: No results found.  {Document cardiac monitor, telemetry assessment procedure when appropriate:32947} Procedures   Medications Ordered in the ED - No data to display    {Click here for ABCD2, HEART and other calculators REFRESH Note before signing:1}                              Medical Decision Making Amount and/or Complexity of Data Reviewed Labs: ordered. Radiology: ordered.  Risk Prescription drug management.   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  Although she does describe neck stiffness, she does not have meningismus on exam, I feel meningitis is not likely.  I reviewed her past records, and note MRI of the brain on 03/31/2023 showing stable dural based nodules and intraosseous enhancements along the left sided craniotomy.  She had office visit on 6//2024 for suspected neurosarcoidosis.  I have ordered morphine  for pain, screening labs and I have ordered MRI of brain with and without contrast.  She has also had involvement of the cervical cord in the past I am also ordering MRI of the cervical spine with and without contrast.  {Document critical care time when appropriate  Document review of labs and clinical decision tools ie CHADS2VASC2, etc  Document your independent review of radiology images and any outside records  Document your discussion with family members, caretakers and with consultants  Document social determinants of health affecting pt's care  Document your decision making why or why not admission, treatments were needed:32947:::1}   Final diagnoses:  None    ED Discharge Orders     None

## 2024-08-09 NOTE — Telephone Encounter (Signed)
 Patient with hx of neurosarcoidosis calling with headache and neck pain. Patient was triaged yesterday and seen in office for neck pain. Patient reports medication she was given yesterday isn't helping. Patient states pain is on the left side of head and generalized pain to neck. Patient is recommended to the ED for evaluation. Patient states she had placed a call to her oncologist in regards to headache and neck pain along with hx of neurosarcoidosis. Placed a call to oncology office who stated to call PCP. Patient states she is wanting to hear back from oncology office before she does anything.  Patient states she is follow with what oncology office recommends.   FYI Only or Action Required?: FYI only for provider.  Patient was last seen in primary care on 08/08/2024 by Tara Corean CROME, FNP.  Called Nurse Triage reporting Neck Pain and Headache.  Symptoms began Sunday afternoon.  Interventions attempted: Prescription medications: tramadol, muscle relaxer and Rest, hydration, or home remedies.  Symptoms are: gradually worsening.  Triage Disposition: Go to ED Now (or PCP Triage)  Patient/caregiver understands and will follow disposition?:   Copied from CRM #[id number]. Topic: Clinical - Red Word Triage >> Aug 09, 2024 10:53 AM Tara [name] wrote: [facility] that prompted transfer to Nurse Triage: Patient states she went to doctor yesterday but nothing is helping and she is having excruciating  pain in her neck.Its unbearable Reason for Disposition  Patient sounds very sick or weak to the triager  Patient sounds very sick or weak to the triager  Answer Assessment - Initial Assessment Questions 1. LOCATION: Where does it hurt?      Left side of head 2. ONSET: When did the headache start? (e.g., minutes, hours, days)      Sunday afternoon 3. PATTERN: Does the pain come and go, or has it been constant since it started?     constant 4. SEVERITY: How bad is the pain? and What does it  keep you from doing?  (e.g., Scale 1-10; mild, moderate, or severe)     Moderate to severe 5. RECURRENT SYMPTOM: Have you ever had headaches before? If Yes, ask: When was the last time? and What happened that time?      yes 6. CAUSE: What do you think is causing the headache?     unsure 7. MIGRAINE: Have you been diagnosed with migraine headaches? If Yes, ask: Is this headache similar?      no 8. HEAD INJURY: Has there been any recent injury to your head?      no 9. OTHER SYMPTOMS: Do you have any other symptoms? (e.g., fever, stiff neck, eye pain, sore throat, cold symptoms)     Neck pain  Answer Assessment - Initial Assessment Questions 1. ONSET: When did the pain begin?      Started on Sunday afternoon 2. LOCATION: Where does it hurt?      All around the neck 3. PATTERN Does the pain come and go, or has it been constant since it started?      constant 4. SEVERITY: How bad is the pain?  (Scale 0-10; or none or slight stiffness, mild, moderate, severe)     severe 5. RADIATION: Does the pain go anywhere else, shoot into your arms?     no 6. CORD SYMPTOMS: Any weakness or numbness of the arms or legs?     no 7. CAUSE: What do you think is causing the neck pain?     unsure 8. NECK OVERUSE: Any  recent activities that involved turning or twisting the neck?     no 9. OTHER SYMPTOMS: Do you have any other symptoms? (e.g., headache, fever, chest pain, difficulty breathing, neck swelling)     headache  Protocols used: Headache-A-AH, Neck Pain or Stiffness-A-AH

## 2024-08-09 NOTE — ED Triage Notes (Addendum)
 On Sunday, while at the beach, the patient began to have posterior head and neck pain. She has not slept since. MD told her she had tight muscles in her back. She received a muscle relaxer and pain medication, but it hasn't helped. MD told her to come here. MD would like her to get an MRI.

## 2024-08-09 NOTE — Telephone Encounter (Signed)
 Tara [name] states she has not seen Dr Buckley in a while, June 2024, but wanted to let him know she is having a pain in her head that started Sunday. I'm having pain in my head and can't turn my head. Pt went to [facility] yesterday. States they gave her something for pain, they wondered if she had slept wrong. She states medication has not helped. Encouraged her to call them back to discuss. RN will follow up with provider covering for Dr Buckley today.

## 2024-08-09 NOTE — Telephone Encounter (Signed)
 FYI

## 2024-08-09 NOTE — Telephone Encounter (Signed)
 Pt left a voicemail stating she was waiting to hear back about her head/neck pain. States PCP recommended that she go to the ED for evaluation.  Notified patient that she should follow PCP recommendations and go to the ED for evaluation. Message has been sent to Dr Buckley to review tomorrow. Verbalized understanding

## 2024-08-10 ENCOUNTER — Emergency Department (HOSPITAL_COMMUNITY)

## 2024-08-10 ENCOUNTER — Encounter

## 2024-08-10 DIAGNOSIS — G9389 Other specified disorders of brain: Secondary | ICD-10-CM | POA: Diagnosis not present

## 2024-08-10 DIAGNOSIS — M4802 Spinal stenosis, cervical region: Secondary | ICD-10-CM | POA: Diagnosis not present

## 2024-08-10 DIAGNOSIS — M47812 Spondylosis without myelopathy or radiculopathy, cervical region: Secondary | ICD-10-CM | POA: Diagnosis not present

## 2024-08-10 DIAGNOSIS — M4312 Spondylolisthesis, cervical region: Secondary | ICD-10-CM | POA: Diagnosis not present

## 2024-08-10 DIAGNOSIS — R519 Headache, unspecified: Secondary | ICD-10-CM | POA: Diagnosis not present

## 2024-08-10 LAB — BASIC METABOLIC PANEL WITH GFR
Anion gap: 10 (ref 5–15)
BUN: 29 mg/dL — ABNORMAL HIGH (ref 8–23)
CO2: 28 mmol/L (ref 22–32)
Calcium: 9.3 mg/dL (ref 8.9–10.3)
Chloride: 91 mmol/L — ABNORMAL LOW (ref 98–111)
Creatinine, Ser: 0.8 mg/dL (ref 0.44–1.00)
GFR, Estimated: >60 mL/min (ref >60)
Glucose, Bld: 123 mg/dL — ABNORMAL HIGH (ref 70–99)
Potassium: 3.4 mmol/L — ABNORMAL LOW (ref 3.5–5.1)
Sodium: 130 mmol/L — ABNORMAL LOW (ref 135–145)

## 2024-08-10 LAB — CBC WITH DIFFERENTIAL/PLATELET
Abs Immature Granulocytes: 0.02 K/uL (ref 0.00–0.07)
Basophils Absolute: 0 K/uL (ref 0.0–0.1)
Basophils Relative: 0 %
Eosinophils Absolute: 0.1 K/uL (ref 0.0–0.5)
Eosinophils Relative: 1 %
HCT: 36.3 % (ref 36.0–46.0)
Hemoglobin: 12 g/dL (ref 12.0–15.0)
Immature Granulocytes: 0 %
Lymphocytes Relative: 9 %
Lymphs Abs: 0.8 K/uL (ref 0.7–4.0)
MCH: 32.1 pg (ref 26.0–34.0)
MCHC: 33.1 g/dL (ref 30.0–36.0)
MCV: 97.1 fL (ref 80.0–100.0)
Monocytes Absolute: 1.2 K/uL — ABNORMAL HIGH (ref 0.1–1.0)
Monocytes Relative: 13 %
Neutro Abs: 7.1 K/uL (ref 1.7–7.7)
Neutrophils Relative %: 77 %
Platelets: 345 K/uL (ref 150–400)
RBC: 3.74 MIL/uL — ABNORMAL LOW (ref 3.87–5.11)
RDW: 12.8 % (ref 11.5–15.5)
WBC: 9.1 K/uL (ref 4.0–10.5)
nRBC: 0 % (ref 0.0–0.2)

## 2024-08-10 MED ORDER — GADOBUTROL 1 MMOL/ML IV SOLN
9.0000 mL | Freq: Once | INTRAVENOUS | Status: AC | PRN
  Administered 2024-08-10: 9 mL via INTRAVENOUS

## 2024-08-10 MED ORDER — OXYCODONE-ACETAMINOPHEN 5-325 MG PO TABS
1.0000 | ORAL_TABLET | Freq: Once | ORAL | Status: AC
  Administered 2024-08-10: 1 via ORAL
  Filled 2024-08-10 (×2): qty 1

## 2024-08-10 MED ORDER — OXYCODONE HCL 5 MG PO TABS: 5.0000 mg | ORAL_TABLET | ORAL | 0 refills | Status: DC | PRN

## 2024-08-10 NOTE — Discharge Instructions (Addendum)
 Your MRI scan shows no evidence of active sarcoidosis.  You may take acetaminophen  as needed for your headache, you may also try applying ice.  For pain not controlled by these measures, you may take oxycodone.  Return to the emergency department if you start running a fever, or have any other new or concerning symptoms.

## 2024-08-10 NOTE — ED Notes (Signed)
Pt. Assisted to bathroom for safety. 

## 2024-08-10 NOTE — ED Notes (Signed)
 Pt in MRI.

## 2024-08-18 ENCOUNTER — Other Ambulatory Visit: Payer: Self-pay | Admitting: Internal Medicine

## 2024-08-21 ENCOUNTER — Encounter: Payer: Self-pay | Admitting: Family Medicine

## 2024-08-21 ENCOUNTER — Ambulatory Visit (INDEPENDENT_AMBULATORY_CARE_PROVIDER_SITE_OTHER): Admitting: Family Medicine

## 2024-08-21 VITALS — BP 130/78 | HR 68 | Temp 97.7°F | Resp 20 | Ht 66.0 in | Wt 194.0 lb

## 2024-08-21 DIAGNOSIS — M542 Cervicalgia: Secondary | ICD-10-CM

## 2024-08-21 NOTE — Progress Notes (Signed)
 Assessment & Plan Musculoskeletal neck pain Neck pain, likely muscular, improved but persistent. Previous tizanidine ineffective, oxycodone provided temporary relief. - Refer to physical therapy for dry needling and additional interventions. Orders:   Ambulatory referral to Physical Therapy   Follow up plan: Return if symptoms worsen or fail to improve.  Tara Rung, MSN, APRN, FNP-C  Subjective:  HPI: Tara [name] is a 73 y.o. female presenting on 08/21/2024 for Hospitalization Follow-up (ER follow up - 08/09/24 at [facility] for headache and neck pain- is getting some better. Pain scale now is at 6 of 10. /This was following a ov to [place] for the neck pain the day prior. )  Discussed the use of AI scribe software for clinical note transcription with the patient, who gave verbal consent to proceed.  She experiences neck pain radiating down her neck, rated as six out of ten in intensity. The pain has persisted despite previous treatments and was initially severe enough to prevent sleep for four days.  She was previously seen in the emergency room and prescribed oxycodone, which she took for three days to aid sleep. She is uncomfortable with taking narcotics but acknowledges the necessity for sleep. She also tried tizanidine, but it did not provide relief, and she did not feel there were any spasms.  The pain impacts her daily activities, including driving, as she is unable to turn her head fully. She describes herself as an active person who enjoys dancing and finds the pain limiting her ability to engage in such activities. She recounts an incident where she had to drive home from the beach and found it difficult due to the pain.  The pain began after a day of normal activities, including exercising and cleaning, and progressively worsened throughout the day. No recent changes in her sleeping arrangements or pillows, which she has used consistently for over a year.        ROS: Negative unless specifically indicated above in HPI.   Relevant past medical history reviewed and updated as indicated.   Allergies and medications reviewed and updated.   Current Outpatient Medications:    alendronate  (FOSAMAX ) 70 MG tablet, TAKE 1 TABLET BY MOUTH ONCE WEEKLY WITH A FULL GLASS OF WATER ON AN EMPTY STOMACH, Disp: 12 tablet, Rfl: 3   aspirin  EC 81 MG tablet, Take 1 tablet (81 mg total) by mouth daily., Disp: , Rfl:    diltiazem  (CARDIZEM  CD) 180 MG 24 hr capsule, Take 2 capsules (360 mg total) by mouth daily., Disp: 180 capsule, Rfl: 3   ELIQUIS  5 MG TABS tablet, TAKE 1 TABLET BY MOUTH 2 TIMES A DAY, Disp: 180 tablet, Rfl: 1   hydrochlorothiazide  (HYDRODIURIL ) 25 MG tablet, TAKE 1 TABLET BY MOUTH DAILY, Disp: 90 tablet, Rfl: 0   metFORMIN  (GLUCOPHAGE ) 500 MG tablet, TAKE 1 TABLET BY MOUTH DAILY WITH BREAKFAST, Disp: 90 tablet, Rfl: 0   metoprolol  tartrate (LOPRESSOR ) 25 MG tablet, TAKE 1 TABLET BY MOUTH AS NEEDED FOR HEART RATE GREATER THEN 100), Disp: 90 tablet, Rfl: 1   Multiple Vitamin (MULTIVITAMIN WITH MINERALS) TABS tablet, Take 1 tablet by mouth daily., Disp: , Rfl:    Multiple Vitamins-Minerals (PRESERVISION AREDS PO), Take 1 capsule by mouth in the morning and at bedtime. Unknown strenght, Disp: , Rfl:    oxyCODONE (ROXICODONE) 5 MG immediate release tablet, Take 1 tablet (5 mg total) by mouth every 4 (four) hours as needed for severe pain (pain score 7-10)., Disp: 10 tablet, Rfl: 0  rosuvastatin  (CRESTOR ) 10 MG tablet, TAKE 1 TABLET BY MOUTH DAILY, Disp: 90 tablet, Rfl: 1   valsartan  (DIOVAN ) 40 MG tablet, TAKE 1 TABLET BY MOUTH DAILY, Disp: 90 tablet, Rfl: 0   tiZANidine (ZANAFLEX) 4 MG tablet, Take 1 tablet (4 mg total) by mouth every 6 (six) hours as needed for muscle spasms. (Patient not taking: Reported on 08/21/2024), Disp: 30 tablet, Rfl: 0  Allergies  Allergen Reactions   Ace Inhibitors Cough   Ether Other (See Comments)    unknown     Objective:   BP 130/78   Pulse 68   Temp 97.7 F (36.5 C)   Resp 20   Ht 5' 6 (1.676 m)   Wt 194 lb (88 kg)   BMI 31.31 kg/m    Physical Exam Vitals reviewed.  Constitutional:      General: She is not in acute distress.    Appearance: Normal appearance. She is not ill-appearing, toxic-appearing or diaphoretic.  HENT:     Head: Normocephalic and atraumatic.  Eyes:     General: No scleral icterus.       Right eye: No discharge.        Left eye: No discharge.     Conjunctiva/sclera: Conjunctivae normal.  Cardiovascular:     Rate and Rhythm: Normal rate.  Pulmonary:     Effort: Pulmonary effort is normal. No respiratory distress.  Musculoskeletal:        General: Normal range of motion.     Cervical back: Normal range of motion. Muscular tenderness (bilaterally in trapezius muscles) present. No spinous process tenderness.  Skin:    General: Skin is warm and dry.     Capillary Refill: Capillary refill takes less than 2 seconds.  Neurological:     General: No focal deficit present.     Mental Status: She is alert and oriented to person, place, and time. Mental status is at baseline.  Psychiatric:        Mood and Affect: Mood normal.        Behavior: Behavior normal.        Thought Content: Thought content normal.        Judgment: Judgment normal.

## 2024-08-27 ENCOUNTER — Other Ambulatory Visit: Payer: Self-pay | Admitting: Internal Medicine

## 2024-08-27 DIAGNOSIS — I10 Essential (primary) hypertension: Secondary | ICD-10-CM

## 2024-08-27 DIAGNOSIS — I2510 Atherosclerotic heart disease of native coronary artery without angina pectoris: Secondary | ICD-10-CM

## 2024-09-06 NOTE — Therapy (Unsigned)
 OUTPATIENT PHYSICAL THERAPY CERVICAL EVALUATION   Patient Name: Tara [name] MRN: [id number] DOB:1951/07/06, 73 y.o., female Today's Date: 09/07/2024  END OF SESSION:  PT End of Session - 09/07/24 1409     Visit Number 1    Number of Visits 8    Date for Recertification  11/07/24    Authorization Type MCR    PT Start Time 1310    PT Stop Time 1400    PT Time Calculation (min) 50 min    Activity Tolerance Patient tolerated treatment well    Behavior During Therapy [facility] for tasks assessed/performed;Anxious          Past Medical History:  Diagnosis Date   Hypertension    Sarcoidosis    Stroke [facility])    Past Surgical History:  Procedure Laterality Date   APPLICATION OF CRANIAL NAVIGATION N/A 01/04/2018   Procedure: APPLICATION OF CRANIAL NAVIGATION;  Surgeon: Unice Pac, MD;  Location: [facility] OR;  Service: Neurosurgery;  Laterality: N/A;   ATRIAL FIBRILLATION ABLATION N/A 04/27/2024   Procedure: ATRIAL FIBRILLATION ABLATION;  Surgeon: Inocencio Soyla Lunger, MD;  Location: [facility] INVASIVE CV LAB;  Service: Cardiovascular;  Laterality: N/A;   BRAIN SURGERY     COLONOSCOPY WITH PROPOFOL  N/A 11/20/2021   Procedure: COLONOSCOPY WITH PROPOFOL ;  Surgeon: Mansouraty, Aloha Raddle., MD;  Location[facility] ENDOSCOPY;  Service: Gastroenterology;  Laterality: N/A;   HEMOSTASIS CLIP PLACEMENT  11/20/2021   Procedure: HEMOSTASIS CLIP PLACEMENT;  Surgeon: Wilhelmenia Aloha Raddle., MD;  Location: [facility] ENDOSCOPY;  Service: Gastroenterology;;   POLYPECTOMY  11/20/2021   Procedure: POLYPECTOMY;  Surgeon: Wilhelmenia Aloha Raddle., MD;  Location: [facility] ENDOSCOPY;  Service: Gastroenterology;;   PR DURAL GRAFT SPINAL Left 01/04/2018   Procedure: Left Pterional craniotomy for biopsy with Brainlab;  Surgeon: Unice Pac, MD;  Location: [facility] OR;  Service: Neurosurgery;  Laterality: Left;  Left Pterional craniotomy for biopsy with Brainlab   SCLEROTHERAPY  11/20/2021   Procedure: SCLEROTHERAPY;  Surgeon: Mansouraty, Aloha Raddle., MD;  Location: [facility] ENDOSCOPY;  Service: Gastroenterology;;   Patient Active Problem List   Diagnosis Date Noted   Status post ablation of atrial fibrillation 07/05/2024   Right leg pain 12/22/2022   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 (HCC) 11/24/2022   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   Hand dominance: Right  PERTINENT HISTORY:  HPI: Tara [name] is a 73 y.o. female presenting on 08/21/2024 for Hospitalization Follow-up (ER follow up - 08/09/24 at [facility] for headache and neck pain- is getting some better. Pain scale now is at 6 of 10. /This was following a ov to [place] for the neck pain the day prior. )   Discussed  the use of AI scribe software for clinical note transcription with the patient, who gave verbal consent to proceed.   She experiences neck pain radiating down her neck, rated as six out of ten in intensity. The pain has persisted despite previous treatments and was initially severe enough to prevent sleep for four days.   She was previously seen in the emergency room and prescribed oxycodone, which she took for three days to aid sleep. She is uncomfortable with taking narcotics but acknowledges the necessity for sleep. She also tried tizanidine, but it did not provide relief, and she did not feel there were any spasms.   The pain impacts her daily activities, including driving, as she is unable to turn her head fully. She describes herself as an active person who enjoys dancing and finds the pain limiting her ability to engage in such activities. She recounts an incident where she had to drive home from the beach and found it difficult due to the pain.   The pain began after a day of normal activities, including exercising and cleaning, and progressively worsened throughout the day. No recent changes in her sleeping arrangements or pillows, which she has used consistently for over a year.     PAIN:  Are you having pain? Yes: NPRS scale: unable to rate Pain location: R>L neck pain Pain description: ache Aggravating factors: turning Relieving factors: rest  PRECAUTIONS: None  RED FLAGS: None     WEIGHT BEARING RESTRICTIONS: No  FALLS:  Has patient fallen in last 6 months? No  OCCUPATION: retired  PLOF: Independent  PATIENT GOALS: To manage my neck pain  NEXT MD VISIT: TBD  OBJECTIVE:  Note: Objective measures were completed at Evaluation unless otherwise noted.  DIAGNOSTIC FINDINGS:  IMPRESSION: 1. No evidence of active Neurosarcoidosis in the cervical spine. 2. Ordinary but advanced cervical spine degeneration including evidence of acute exacerbation of chronic facet  arthritis on the right at C2-C3. 3. 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 No spinal cord signal abnormality. 4. And widespread associated moderate and severe cervical neural foraminal stenosis.   Electronically signed by: Helayne Hurst MD 08/10/2024 07:30 AM EDT RP Workstation: [id number]  PATIENT SURVEYS:  NDI: 12/50  Minimum Detectable Change (90% confidence): 5 points or 10% points  POSTURE: rounded shoulders and forward head  PALPATION: TTP through cervical musculature especially B scalene groups.   CERVICAL ROM:   Active ROM A/PROM (deg) eval  Flexion 75%  Extension 10%  Right lateral flexion 75%  Left lateral flexion 75%  Right rotation 50%  Left rotation 75%   (Blank rows = not tested)  UPPER EXTREMITY ROM: WFL  Active ROM Right eval Left eval  Shoulder flexion    Shoulder extension    Shoulder abduction    Shoulder adduction    Shoulder extension    Shoulder internal rotation    Shoulder external rotation    Elbow flexion  Elbow extension    Wrist flexion    Wrist extension    Wrist ulnar deviation    Wrist radial deviation    Wrist pronation    Wrist supination     (Blank rows = not tested)  UPPER EXTREMITY MMT: [facility]  MMT Right eval Left eval  Shoulder flexion    Shoulder extension    Shoulder abduction    Shoulder adduction    Shoulder extension    Shoulder internal rotation    Shoulder external rotation    Middle trapezius    Lower trapezius    Elbow flexion    Elbow extension    Wrist flexion    Wrist extension    Wrist ulnar deviation    Wrist radial deviation    Wrist pronation    Wrist supination    Grip strength     (Blank rows = not tested)  CERVICAL SPECIAL TESTS:  Neck flexor muscle endurance test: Negative and Spurling's test: Negative  FUNCTIONAL TESTS:  N/A  TREATMENT:                                                                                                                            [facility] Adult PT Treatment:                                                DATE: 09/07/24 Eval and HEP Self Care: Additional minutes spent for educating on updated Therapeutic Home Exercise Program as well as comparing current status to condition at start of symptoms. This included exercises focusing on stretching, strengthening, with focus on eccentric aspects. Long term goals include an improvement in range of motion, strength, endurance as well as avoiding reinjury. Patient's frequency would include in 1-2 times a day, 3-5 times a week for a duration of 6-12 weeks. Proper technique shown and discussed handout in great detail. All questions were discussed and addressed.      PATIENT EDUCATION:  Education details: Discussed eval findings, rehab rationale and POC and patient is in agreement  Person educated: Patient Education method: Explanation and Handouts Education comprehension: verbalized understanding and needs further education  HOME EXERCISE PROGRAM: Access Code: [email or web address] URL: [email or web address] Date: 09/07/2024 Prepared by: Reyes Kohut  Exercises - Seated Thoracic Lumbar Extension with Pectoralis Stretch  - 1-2 x daily - 5 x weekly - 1-2 sets - 10 reps - Seated Upper Trapezius Stretch  - 1-2 x daily - 5 x weekly - 1 sets - 2 reps - 30s hold - Shoulder External Rotation and Scapular Retraction with Resistance  - 1-2 x daily - 5 x weekly - 1 sets - 10-15 reps - Standing Shoulder Horizontal Abduction with Resistance  - 1-2 x daily - 5 x weekly - 1 sets - 10-15 reps  ASSESSMENT:  CLINICAL IMPRESSION: Patient is a  73 y.o. female who was seen today for physical therapy evaluation and treatment for neck pain.  Patient denies radicular symptoms or UE weakness.  Symptoms have markedly improved since onset.  She presents with a forward head and rounded shoulder posture, BUE AROM and strength WFL.   Palpation finds TTP through cervical musculature, especially B scalene groups.  Patient is a good candidate for OPPT with goal of minimizing symptoms and increasing cervical mobility.  OBJECTIVE IMPAIRMENTS: decreased activity tolerance, decreased knowledge of condition, decreased mobility, decreased ROM, increased fascial restrictions, impaired flexibility, improper body mechanics, postural dysfunction, and pain.   ACTIVITY LIMITATIONS: carrying, lifting, sleeping, bed mobility, and reading  PERSONAL FACTORS: Age, Behavior pattern, Past/current experiences, Time since onset of injury/illness/exacerbation, and 1 comorbidity: neurosarcoidosis are also affecting patient's functional outcome.   REHAB POTENTIAL: Good  CLINICAL DECISION MAKING: Stable/uncomplicated  EVALUATION COMPLEXITY: Moderate   GOALS: Goals reviewed with patient? No  SHORT TERM GOALS: Target date: 09/28/2024    Patient to demonstrate independence in HEP  Baseline: [id number] Goal status: INITIAL    LONG TERM GOALS: Target date: 11/02/2024    Patient will score at least 7/50 on NDI to signify clinically meaningful improvement in functional abilities.   Baseline: 12/50 Goal status: INITIAL  2.  Patient will acknowledge minimal pain at least once during episode of care   Baseline: moderate with reading tasks Goal status: INITIAL  3.  Increase AROM cervical spine to 75% Baseline:  Active ROM A/PROM (deg) eval  Flexion 75%  Extension 10%  Right lateral flexion 75%  Left lateral flexion 75%  Right rotation 50%  Left rotation 75%   Goal status: INITIAL  4.  Patient will demonstrate appropriate posture when cued. Baseline: Forward head and rounded shoulders Goal status: INITIAL     PLAN:  PT FREQUENCY: 1-2x/week  PT DURATION: 4 weeks  PLANNED INTERVENTIONS: 97110-Therapeutic exercises, 97530- Therapeutic activity, 97112- Neuromuscular re-education, 97535- Self Care, [id number]- Manual therapy, 20560  (1-2 muscles), 20561 (3+ muscles)- Dry Needling, and Patient/Family education  PLAN FOR NEXT SESSION: HEP review and update, manual techniques as appropriate, aerobic tasks, ROM and flexibility activities, strengthening and PREs, TPDN, gait and balance training,aquatic therapy, modalities for pain and NMRE      Duvan Mousel M Lyra Alaimo, PT 09/07/2024, 2:10 PM

## 2024-09-07 ENCOUNTER — Ambulatory Visit: Attending: Internal Medicine

## 2024-09-07 ENCOUNTER — Other Ambulatory Visit: Payer: Self-pay

## 2024-09-07 DIAGNOSIS — M6281 Muscle weakness (generalized): Secondary | ICD-10-CM | POA: Diagnosis present

## 2024-09-07 DIAGNOSIS — R293 Abnormal posture: Secondary | ICD-10-CM | POA: Diagnosis present

## 2024-09-07 DIAGNOSIS — M542 Cervicalgia: Secondary | ICD-10-CM | POA: Diagnosis present

## 2024-09-08 NOTE — Therapy (Signed)
 OUTPATIENT PHYSICAL THERAPY CERVICAL EVALUATION   Patient Name: Tara [name] MRN: [id number] DOB:November 26, 1950, 73 y.o., female Today's Date: 09/11/2024  END OF SESSION:  PT End of Session - 09/11/24 1534     Visit Number 2    Number of Visits 8    Date for Recertification  11/07/24    Authorization Type MCR    PT Start Time 1530    PT Stop Time 1610    PT Time Calculation (min) 40 min    Activity Tolerance Patient tolerated treatment well    Behavior During Therapy [facility] for tasks assessed/performed;Anxious           Past Medical History:  Diagnosis Date   Hypertension    Sarcoidosis    Stroke [facility])    Past Surgical History:  Procedure Laterality Date   APPLICATION OF CRANIAL NAVIGATION N/A 01/04/2018   Procedure: APPLICATION OF CRANIAL NAVIGATION;  Surgeon: Unice Pac, MD;  Location: [facility] OR;  Service: Neurosurgery;  Laterality: N/A;   ATRIAL FIBRILLATION ABLATION N/A 04/27/2024   Procedure: ATRIAL FIBRILLATION ABLATION;  Surgeon: Inocencio Soyla Lunger, MD;  Location: [facility] INVASIVE CV LAB;  Service: Cardiovascular;  Laterality: N/A;   BRAIN SURGERY     COLONOSCOPY WITH PROPOFOL  N/A 11/20/2021   Procedure: COLONOSCOPY WITH PROPOFOL ;  Surgeon: Mansouraty, Aloha Raddle., MD;  Location: [facility] ENDOSCOPY;  Service: Gastroenterology;  Laterality: N/A;   HEMOSTASIS CLIP PLACEMENT  11/20/2021   Procedure: HEMOSTASIS CLIP PLACEMENT;  Surgeon: Wilhelmenia Aloha Raddle., MD;  Location: [facility] ENDOSCOPY;  Service: Gastroenterology;;   POLYPECTOMY  11/20/2021   Procedure: POLYPECTOMY;  Surgeon: Wilhelmenia Aloha Raddle., MD;  Location: [facility] ENDOSCOPY;  Service: Gastroenterology;;   PR DURAL GRAFT SPINAL Left 01/04/2018   Procedure: Left Pterional craniotomy for biopsy with Brainlab;  Surgeon: Unice Pac, MD;  Location: [facility] OR;  Service: Neurosurgery;  Laterality: Left;  Left Pterional craniotomy for biopsy with Brainlab   SCLEROTHERAPY  11/20/2021   Procedure: SCLEROTHERAPY;  Surgeon: Mansouraty,  Aloha Raddle., MD;  Location: [facility] ENDOSCOPY;  Service: Gastroenterology;;   Patient Active Problem List   Diagnosis Date Noted   Status post ablation of atrial fibrillation 07/05/2024   Right leg pain 12/22/2022   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 (HCC) 11/24/2022   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 Hand dominance: Right  PERTINENT HISTORY:  HPI: Tara [name] is a 73 y.o. female presenting on 08/21/2024 for Hospitalization Follow-up (ER follow up - 08/09/24 at [facility] for headache and neck pain- is getting some better. Pain scale now is at 6 of 10. /This was following a ov to [place] for the neck pain the day prior. )   Discussed the use of AI scribe software for  clinical note transcription with the patient, who gave verbal consent to proceed.   She experiences neck pain radiating down her neck, rated as six out of ten in intensity. The pain has persisted despite previous treatments and was initially severe enough to prevent sleep for four days.   She was previously seen in the emergency room and prescribed oxycodone, which she took for three days to aid sleep. She is uncomfortable with taking narcotics but acknowledges the necessity for sleep. She also tried tizanidine, but it did not provide relief, and she did not feel there were any spasms.   The pain impacts her daily activities, including driving, as she is unable to turn her head fully. She describes herself as an active person who enjoys dancing and finds the pain limiting her ability to engage in such activities. She recounts an incident where she had to drive home from the beach and found it difficult due to the pain.   The pain began after a day of normal activities, including exercising and cleaning, and progressively worsened throughout the day. No recent changes in her sleeping arrangements or pillows, which she has used consistently for over a year.     PAIN:  Are you having pain? Yes: NPRS scale: unable to rate Pain location: R>L neck pain Pain description: ache Aggravating factors: turning Relieving factors: rest  PRECAUTIONS: None  RED FLAGS: None     WEIGHT BEARING RESTRICTIONS: No  FALLS:  Has patient fallen in last 6 months? No  OCCUPATION: retired  PLOF: Independent  PATIENT GOALS: To manage my neck pain  NEXT MD VISIT: TBD  OBJECTIVE:  Note: Objective measures were completed at Evaluation unless otherwise noted.  DIAGNOSTIC FINDINGS:  IMPRESSION: 1. No evidence of active Neurosarcoidosis in the cervical spine. 2. Ordinary but advanced cervical spine degeneration including evidence of acute exacerbation of chronic facet arthritis on the right at C2-C3. 3. 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 No spinal cord signal abnormality. 4. And widespread associated moderate and severe cervical neural foraminal stenosis.   Electronically signed by: Helayne Hurst MD 08/10/2024 07:30 AM EDT RP Workstation: [id number]  PATIENT SURVEYS:  NDI: 12/50  Minimum Detectable Change (90% confidence): 5 points or 10% points  POSTURE: rounded shoulders and forward head  PALPATION: TTP through cervical musculature especially B scalene groups.   CERVICAL ROM:   Active ROM A/PROM (deg) eval  Flexion 75%  Extension 10%  Right lateral flexion 75%  Left lateral flexion 75%  Right rotation 50%  Left rotation 75%   (Blank rows = not tested)  UPPER EXTREMITY ROM: WFL  Active ROM Right eval Left eval  Shoulder flexion    Shoulder extension    Shoulder abduction    Shoulder adduction    Shoulder extension    Shoulder internal rotation    Shoulder external rotation    Elbow flexion    Elbow extension  Wrist flexion    Wrist extension    Wrist ulnar deviation    Wrist radial deviation    Wrist pronation    Wrist supination     (Blank rows = not tested)  UPPER EXTREMITY MMT: [facility]  MMT Right eval Left eval  Shoulder flexion    Shoulder extension    Shoulder abduction    Shoulder adduction    Shoulder extension    Shoulder internal rotation    Shoulder external rotation    Middle trapezius    Lower trapezius    Elbow flexion    Elbow extension    Wrist flexion    Wrist extension    Wrist ulnar deviation    Wrist radial deviation    Wrist pronation    Wrist supination    Grip strength     (Blank rows = not tested)  CERVICAL SPECIAL TESTS:  Neck flexor muscle endurance test: Negative and Spurling's test: Negative  FUNCTIONAL TESTS:  N/A  TREATMENT:         [facility] Adult PT Treatment:                                                 DATE: 09/11/24 Therapeutic Exercise: Nustep L2 8 min Manual Therapy: B scalene stretch 30s x3 B B levator stretch 30s x2 B Therapeutic Activity: B UT Stretch 30s B ER YTB 15x Seated hor abd YTB 15x Supine hor abd YTB 15x B, 15/15 Supine OH flexion 1# 15/15                                                                                                                  [facility] Adult PT Treatment:                                                DATE: 09/07/24 Eval and HEP Self Care: Additional minutes spent for educating on updated Therapeutic Home Exercise Program as well as comparing current status to condition at start of symptoms. This included exercises focusing on stretching, strengthening, with focus on eccentric aspects. Long term goals include an improvement in range of motion, strength, endurance as well as avoiding reinjury. Patient's frequency would include in 1-2 times a day, 3-5 times a week for a duration of 6-12 weeks. Proper technique shown and discussed handout in great detail. All questions were discussed and addressed.      PATIENT EDUCATION:  Education details: Discussed eval findings, rehab rationale and POC and patient is in agreement  Person educated: Patient Education method: Explanation and Handouts Education comprehension: verbalized understanding and needs further education  HOME EXERCISE PROGRAM: Access Code: [email or web address] URL: [email or web address] Date: 09/07/2024 Prepared by: Reyes Kohut  Exercises - Seated Thoracic Lumbar Extension with  Pectoralis Stretch  - 1-2 x daily - 5 x weekly - 1-2 sets - 10 reps - Seated Upper Trapezius Stretch  - 1-2 x daily - 5 x weekly - 1 sets - 2 reps - 30s hold - Shoulder External Rotation and Scapular Retraction with Resistance  - 1-2 x daily - 5 x weekly - 1 sets - 10-15 reps - Standing Shoulder Horizontal Abduction with Resistance  - 1-2 x daily - 5 x weekly - 1 sets - 10-15 reps  ASSESSMENT:  CLINICAL  IMPRESSION:  First f/u session.  Focus was HEP review, posture retraining and manual stretch of affected muscles and soft tissues   Patient is a 73 y.o. female who was seen today for physical therapy evaluation and treatment for neck pain.  Patient denies radicular symptoms or UE weakness.  Symptoms have markedly improved since onset.  She presents with a forward head and rounded shoulder posture, BUE AROM and strength WFL.  Palpation finds TTP through cervical musculature, especially B scalene groups.  Patient is a good candidate for OPPT with goal of minimizing symptoms and increasing cervical mobility.  OBJECTIVE IMPAIRMENTS: decreased activity tolerance, decreased knowledge of condition, decreased mobility, decreased ROM, increased fascial restrictions, impaired flexibility, improper body mechanics, postural dysfunction, and pain.   ACTIVITY LIMITATIONS: carrying, lifting, sleeping, bed mobility, and reading  PERSONAL FACTORS: Age, Behavior pattern, Past/current experiences, Time since onset of injury/illness/exacerbation, and 1 comorbidity: neurosarcoidosis are also affecting patient's functional outcome.   REHAB POTENTIAL: Good  CLINICAL DECISION MAKING: Stable/uncomplicated  EVALUATION COMPLEXITY: Moderate   GOALS: Goals reviewed with patient? No  SHORT TERM GOALS: Target date: 09/28/2024    Patient to demonstrate independence in HEP  Baseline: [id number] Goal status: INITIAL    LONG TERM GOALS: Target date: 11/02/2024    Patient will score at least 7/50 on NDI to signify clinically meaningful improvement in functional abilities.   Baseline: 12/50 Goal status: INITIAL  2.  Patient will acknowledge minimal pain at least once during episode of care   Baseline: moderate with reading tasks Goal status: INITIAL  3.  Increase AROM cervical spine to 75% Baseline:  Active ROM A/PROM (deg) eval  Flexion 75%  Extension 10%  Right lateral flexion 75%  Left lateral flexion 75%   Right rotation 50%  Left rotation 75%   Goal status: INITIAL  4.  Patient will demonstrate appropriate posture when cued. Baseline: Forward head and rounded shoulders Goal status: INITIAL     PLAN:  PT FREQUENCY: 1-2x/week  PT DURATION: 4 weeks  PLANNED INTERVENTIONS: 97110-Therapeutic exercises, 97530- Therapeutic activity, 97112- Neuromuscular re-education, 97535- Self Care, [id number]- Manual therapy, 20560 (1-2 muscles), 20561 (3+ muscles)- Dry Needling, and Patient/Family education  PLAN FOR NEXT SESSION: HEP review and update, manual techniques as appropriate, aerobic tasks, ROM and flexibility activities, strengthening and PREs, TPDN, gait and balance training,aquatic therapy, modalities for pain and NMRE      Loretto Belinsky M Dot Splinter, PT 09/11/2024, 4:13 PM

## 2024-09-11 ENCOUNTER — Ambulatory Visit

## 2024-09-11 DIAGNOSIS — M6281 Muscle weakness (generalized): Secondary | ICD-10-CM

## 2024-09-11 DIAGNOSIS — R293 Abnormal posture: Secondary | ICD-10-CM | POA: Diagnosis not present

## 2024-09-11 DIAGNOSIS — M542 Cervicalgia: Secondary | ICD-10-CM

## 2024-09-13 ENCOUNTER — Ambulatory Visit

## 2024-09-13 DIAGNOSIS — R293 Abnormal posture: Secondary | ICD-10-CM

## 2024-09-13 DIAGNOSIS — M542 Cervicalgia: Secondary | ICD-10-CM | POA: Diagnosis not present

## 2024-09-13 DIAGNOSIS — M6281 Muscle weakness (generalized): Secondary | ICD-10-CM | POA: Diagnosis not present

## 2024-09-13 NOTE — Therapy (Signed)
 OUTPATIENT PHYSICAL THERAPY CERVICAL EVALUATION   Patient Name: Tara [name] MRN: [id number] DOB:10/10/1951, 73 y.o., female Today's Date: 09/13/2024  END OF SESSION:  PT End of Session - 09/13/24 1619     Visit Number 3    Number of Visits 8    Date for Recertification  11/07/24    Authorization Type MCR    PT Start Time 1615    PT Stop Time 1655    PT Time Calculation (min) 40 min    Activity Tolerance Patient tolerated treatment well    Behavior During Therapy [facility] for tasks assessed/performed         Past Medical History:  Diagnosis Date   Hypertension    Sarcoidosis    Stroke [facility])    Past Surgical History:  Procedure Laterality Date   APPLICATION OF CRANIAL NAVIGATION N/A 01/04/2018   Procedure: APPLICATION OF CRANIAL NAVIGATION;  Surgeon: Unice Pac, MD;  Location: [facility] OR;  Service: Neurosurgery;  Laterality: N/A;   ATRIAL FIBRILLATION ABLATION N/A 04/27/2024   Procedure: ATRIAL FIBRILLATION ABLATION;  Surgeon: Inocencio Soyla Lunger, MD;  Location: [facility] INVASIVE CV LAB;  Service: Cardiovascular;  Laterality: N/A;   BRAIN SURGERY     COLONOSCOPY WITH PROPOFOL  N/A 11/20/2021   Procedure: COLONOSCOPY WITH PROPOFOL ;  Surgeon: Mansouraty, Aloha Raddle., MD;  Location: [facility] ENDOSCOPY;  Service: Gastroenterology;  Laterality: N/A;   HEMOSTASIS CLIP PLACEMENT  11/20/2021   Procedure: HEMOSTASIS CLIP PLACEMENT;  Surgeon: Wilhelmenia Aloha Raddle., MD;  Location: [facility] ENDOSCOPY;  Service: Gastroenterology;;   POLYPECTOMY  11/20/2021   Procedure: POLYPECTOMY;  Surgeon: Wilhelmenia Aloha Raddle., MD;  Location: [facility] ENDOSCOPY;  Service: Gastroenterology;;   PR DURAL GRAFT SPINAL Left 01/04/2018   Procedure: Left Pterional craniotomy for biopsy with Brainlab;  Surgeon: Unice Pac, MD;  Location: [facility] OR;  Service: Neurosurgery;  Laterality: Left;  Left Pterional craniotomy for biopsy with Brainlab   SCLEROTHERAPY  11/20/2021   Procedure: SCLEROTHERAPY;  Surgeon: Mansouraty, Aloha Raddle., MD;   Location: [facility] ENDOSCOPY;  Service: Gastroenterology;;   Patient Active Problem List   Diagnosis Date Noted   Status post ablation of atrial fibrillation 07/05/2024   Right leg pain 12/22/2022   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 (HCC) 11/24/2022   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 Earlier today when lifting a 30# turkey it exacerbated the pain. Patient states that she has been compliant with her HEP.   Hand dominance: Right  PERTINENT HISTORY:  HPI: Tara [name] is a 73 y.o. female presenting on 08/21/2024 for Hospitalization Follow-up (ER follow up - 08/09/24 at [facility] for headache and neck pain- is getting some better. Pain scale now is at 6 of 10. /This was  following a ov to [place] for the neck pain the day prior. )   Discussed the use of AI scribe software for clinical note transcription with the patient, who gave verbal consent to proceed.   She experiences neck pain radiating down her neck, rated as six out of ten in intensity. The pain has persisted despite previous treatments and was initially severe enough to prevent sleep for four days.   She was previously seen in the emergency room and prescribed oxycodone, which she took for three days to aid sleep. She is uncomfortable with taking narcotics but acknowledges the necessity for sleep. She also tried tizanidine, but it did not provide relief, and she did not feel there were any spasms.   The pain impacts her daily activities, including driving, as she is unable to turn her head fully. She describes herself as an active person who enjoys dancing and finds the pain limiting her ability to engage in such activities. She recounts an incident where she had to drive home from the beach and found it difficult due to the pain.   The pain began after a day of normal activities, including exercising and cleaning, and progressively worsened throughout the day. No recent changes in her sleeping arrangements or pillows, which she has used consistently for over a year.     PAIN:  Are you having pain? Yes: NPRS scale: unable to rate Pain location: R>L neck pain Pain description: ache Aggravating factors: turning Relieving factors: rest  PRECAUTIONS: None  RED FLAGS: None     WEIGHT BEARING RESTRICTIONS: No  FALLS:  Has patient fallen in last 6 months? No  OCCUPATION: retired  PLOF: Independent  PATIENT GOALS: To manage my neck pain  NEXT MD VISIT: TBD  OBJECTIVE:  Note: Objective measures were completed at Evaluation unless otherwise noted.  DIAGNOSTIC FINDINGS:  IMPRESSION: 1. No evidence of active Neurosarcoidosis in the cervical spine. 2. Ordinary but advanced cervical spine  degeneration including evidence of acute exacerbation of chronic facet arthritis on the right at C2-C3. 3. 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 No spinal cord signal abnormality. 4. And widespread associated moderate and severe cervical neural foraminal stenosis.   Electronically signed by: Helayne Hurst MD 08/10/2024 07:30 AM EDT RP Workstation: [id number]  PATIENT SURVEYS:  NDI: 12/50  Minimum Detectable Change (90% confidence): 5 points or 10% points  POSTURE: rounded shoulders and forward head  PALPATION: TTP through cervical musculature especially B scalene groups.   CERVICAL ROM:   Active ROM A/PROM (deg) eval  Flexion 75%  Extension 10%  Right lateral flexion 75%  Left lateral flexion 75%  Right rotation 50%  Left rotation 75%   (Blank rows = not tested)  UPPER EXTREMITY ROM: WFL  Active ROM Right eval Left eval  Shoulder flexion    Shoulder extension    Shoulder abduction    Shoulder adduction    Shoulder extension  Shoulder internal rotation    Shoulder external rotation    Elbow flexion    Elbow extension    Wrist flexion    Wrist extension    Wrist ulnar deviation    Wrist radial deviation    Wrist pronation    Wrist supination     (Blank rows = not tested)  UPPER EXTREMITY MMT: [facility]  MMT Right eval Left eval  Shoulder flexion    Shoulder extension    Shoulder abduction    Shoulder adduction    Shoulder extension    Shoulder internal rotation    Shoulder external rotation    Middle trapezius    Lower trapezius    Elbow flexion    Elbow extension    Wrist flexion    Wrist extension    Wrist ulnar deviation    Wrist radial deviation    Wrist pronation    Wrist supination    Grip strength     (Blank rows = not tested)  CERVICAL SPECIAL TESTS:  Neck flexor muscle endurance test: Negative and Spurling's test:  Negative  FUNCTIONAL TESTS:  N/A  TREATMENT: [facility] Adult PT Treatment:                                                DATE: 09/13/24 Therapeutic Exercise: Nustep level 5 x 8 min Standing Rows 2x10 RTB Shoulder Extension 2x10 RTB Upper trap stretch 2x30 Seated BIL ER RTB Seated horizontal abduction RTB 2x10 SNAGs rotation and ext ea x10 Supine horizontal adbuction fall outs 2x10 BIL GTB           [facility] Adult PT Treatment:                                                DATE: 09/11/24 Therapeutic Exercise: Nustep L2 8 min Manual Therapy: B scalene stretch 30s x3 B B levator stretch 30s x2 B Therapeutic Activity: B UT Stretch 30s B ER YTB 15x Seated hor abd YTB 15x Supine hor abd YTB 15x B, 15/15 Supine OH flexion 1# 15/15                                                                                                                  [facility] Adult PT Treatment:                                                DATE: 09/07/24 Eval and HEP Self Care: Additional minutes spent for educating on updated Therapeutic Home Exercise Program as well as comparing current status to condition at start of symptoms. This included exercises focusing on  stretching, strengthening, with focus on eccentric aspects. Long term goals include an improvement in range of motion, strength, endurance as well as avoiding reinjury. Patient's frequency would include in 1-2 times a day, 3-5 times a week for a duration of 6-12 weeks. Proper technique shown and discussed handout in great detail. All questions were discussed and addressed.      PATIENT EDUCATION:  Education details: Discussed eval findings, rehab rationale and POC and patient is in agreement  Person educated: Patient Education method: Explanation and Handouts Education comprehension: verbalized understanding and needs further education  HOME EXERCISE PROGRAM: Access Code: [email or web address] URL: [email or web address] Date: 09/07/2024 Prepared by:  Reyes Kohut  Exercises - Seated Thoracic Lumbar Extension with Pectoralis Stretch  - 1-2 x daily - 5 x weekly - 1-2 sets - 10 reps - Seated Upper Trapezius Stretch  - 1-2 x daily - 5 x weekly - 1 sets - 2 reps - 30s hold - Shoulder External Rotation and Scapular Retraction with Resistance  - 1-2 x daily - 5 x weekly - 1 sets - 10-15 reps - Standing Shoulder Horizontal Abduction with Resistance  - 1-2 x daily - 5 x weekly - 1 sets - 10-15 reps  ASSESSMENT:  CLINICAL IMPRESSION:   Patient presents to PT reporting that she was feeling okay until she lifted a 30# turkey which elicited pain. Overall she states that she hasn't been having too much pain, but still having decreased range of motion. Today's session focused on stretching and strengthening to increase mobility. Patient tolerated exercises well with muscular fatigue. Patient will benefit from skilled PT to increase functional mobility.   EVAL: Patient is a 73 y.o. female who was seen today for physical therapy evaluation and treatment for neck pain.  Patient denies radicular symptoms or UE weakness.  Symptoms have markedly improved since onset.  She presents with a forward head and rounded shoulder posture, BUE AROM and strength WFL.  Palpation finds TTP through cervical musculature, especially B scalene groups.  Patient is a good candidate for OPPT with goal of minimizing symptoms and increasing cervical mobility.  OBJECTIVE IMPAIRMENTS: decreased activity tolerance, decreased knowledge of condition, decreased mobility, decreased ROM, increased fascial restrictions, impaired flexibility, improper body mechanics, postural dysfunction, and pain.   ACTIVITY LIMITATIONS: carrying, lifting, sleeping, bed mobility, and reading  PERSONAL FACTORS: Age, Behavior pattern, Past/current experiences, Time since onset of injury/illness/exacerbation, and 1 comorbidity: neurosarcoidosis are also affecting patient's functional outcome.   REHAB  POTENTIAL: Good  CLINICAL DECISION MAKING: Stable/uncomplicated  EVALUATION COMPLEXITY: Moderate   GOALS: Goals reviewed with patient? No  SHORT TERM GOALS: Target date: 09/28/2024    Patient to demonstrate independence in HEP  Baseline: [id number] Goal status: INITIAL    LONG TERM GOALS: Target date: 11/02/2024    Patient will score at least 7/50 on NDI to signify clinically meaningful improvement in functional abilities.   Baseline: 12/50 Goal status: INITIAL  2.  Patient will acknowledge minimal pain at least once during episode of care   Baseline: moderate with reading tasks Goal status: INITIAL  3.  Increase AROM cervical spine to 75% Baseline:  Active ROM A/PROM (deg) eval  Flexion 75%  Extension 10%  Right lateral flexion 75%  Left lateral flexion 75%  Right rotation 50%  Left rotation 75%   Goal status: INITIAL  4.  Patient will demonstrate appropriate posture when cued. Baseline: Forward head and rounded shoulders Goal status: INITIAL     PLAN:  PT FREQUENCY: 1-2x/week  PT DURATION:  4 weeks  PLANNED INTERVENTIONS: 97110-Therapeutic exercises, 97530- Therapeutic activity, [id number]- Neuromuscular re-education, 97535- Self Care, [id number]- Manual therapy, 20560 (1-2 muscles), 20561 (3+ muscles)- Dry Needling, and Patient/Family education  PLAN FOR NEXT SESSION: HEP review and update, manual techniques as appropriate, aerobic tasks, ROM and flexibility activities, strengthening and PREs, TPDN, gait and balance training,aquatic therapy, modalities for pain and NMRE      Shanda Code, SPTA 09/13/2024, 5:53 PM

## 2024-09-18 NOTE — Therapy (Unsigned)
 OUTPATIENT PHYSICAL THERAPY TREATMENT NOTE   Patient Name: Tara [name] MRN: [id number] DOB:Oct 23, 1951, 73 y.o., female Today's Date: 09/19/2024  END OF SESSION:  PT End of Session - 09/19/24 1313     Visit Number 4    Number of Visits 8    Date for Recertification  11/07/24    Authorization Type MCR    PT Start Time 1315    PT Stop Time 1400    PT Time Calculation (min) 45 min    Activity Tolerance Patient tolerated treatment well    Behavior During Therapy [facility] for tasks assessed/performed          Past Medical History:  Diagnosis Date   Hypertension    Sarcoidosis    Stroke [facility])    Past Surgical History:  Procedure Laterality Date   APPLICATION OF CRANIAL NAVIGATION N/A 01/04/2018   Procedure: APPLICATION OF CRANIAL NAVIGATION;  Surgeon: Unice Pac, MD;  Location: [facility] OR;  Service: Neurosurgery;  Laterality: N/A;   ATRIAL FIBRILLATION ABLATION N/A 04/27/2024   Procedure: ATRIAL FIBRILLATION ABLATION;  Surgeon: Inocencio Soyla Lunger, MD;  Location: [facility] INVASIVE CV LAB;  Service: Cardiovascular;  Laterality: N/A;   BRAIN SURGERY     COLONOSCOPY WITH PROPOFOL  N/A 11/20/2021   Procedure: COLONOSCOPY WITH PROPOFOL ;  Surgeon: Mansouraty, Aloha Raddle., MD;  Location: [facility] ENDOSCOPY;  Service: Gastroenterology;  Laterality: N/A;   HEMOSTASIS CLIP PLACEMENT  11/20/2021   Procedure: HEMOSTASIS CLIP PLACEMENT;  Surgeon: Wilhelmenia Aloha Raddle., MD;  Location: [facility] ENDOSCOPY;  Service: Gastroenterology;;   POLYPECTOMY  11/20/2021   Procedure: POLYPECTOMY;  Surgeon: Wilhelmenia Aloha Raddle., MD;  Location: [facility] ENDOSCOPY;  Service: Gastroenterology;;   PR DURAL GRAFT SPINAL Left 01/04/2018   Procedure: Left Pterional craniotomy for biopsy with Brainlab;  Surgeon: Unice Pac, MD;  Location: [facility] OR;  Service: Neurosurgery;  Laterality: Left;  Left Pterional craniotomy for biopsy with Brainlab   SCLEROTHERAPY  11/20/2021   Procedure: SCLEROTHERAPY;  Surgeon: Mansouraty, Aloha Raddle., MD;   Location: [facility] ENDOSCOPY;  Service: Gastroenterology;;   Patient Active Problem List   Diagnosis Date Noted   Status post ablation of atrial fibrillation 07/05/2024   Right leg pain 12/22/2022   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 (HCC) 11/24/2022   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  Still has discomfort and resistance with cervical rotation.  Reports compliance with HEP  Hand dominance: Right  PERTINENT HISTORY:  HPI: Tara [name] is a 73 y.o. female presenting on 08/21/2024 for Hospitalization Follow-up (ER follow up - 08/09/24 at [facility] for headache and neck pain- is getting some better. Pain scale now is at 6 of 10. /This was following a ov to [place] for the neck pain the day prior. )   Discussed the use  of AI scribe software for clinical note transcription with the patient, who gave verbal consent to proceed.   She experiences neck pain radiating down her neck, rated as six out of ten in intensity. The pain has persisted despite previous treatments and was initially severe enough to prevent sleep for four days.   She was previously seen in the emergency room and prescribed oxycodone, which she took for three days to aid sleep. She is uncomfortable with taking narcotics but acknowledges the necessity for sleep. She also tried tizanidine, but it did not provide relief, and she did not feel there were any spasms.   The pain impacts her daily activities, including driving, as she is unable to turn her head fully. She describes herself as an active person who enjoys dancing and finds the pain limiting her ability to engage in such activities. She recounts an incident where she had to drive home from the beach and found it difficult due to the pain.   The pain began after a day of normal activities, including exercising and cleaning, and progressively worsened throughout the day. No recent changes in her sleeping arrangements or pillows, which she has used consistently for over a year.     PAIN:  Are you having pain? Yes: NPRS scale: unable to rate Pain location: R>L neck pain Pain description: ache Aggravating factors: turning Relieving factors: rest  PRECAUTIONS: None  RED FLAGS: None     WEIGHT BEARING RESTRICTIONS: No  FALLS:  Has patient fallen in last 6 months? No  OCCUPATION: retired  PLOF: Independent  PATIENT GOALS: To manage my neck pain  NEXT MD VISIT: TBD  OBJECTIVE:  Note: Objective measures were completed at Evaluation unless otherwise noted.  DIAGNOSTIC FINDINGS:  IMPRESSION: 1. No evidence of active Neurosarcoidosis in the cervical spine. 2. Ordinary but advanced cervical spine degeneration including evidence of acute exacerbation of chronic facet arthritis on  the right at C2-C3. 3. 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 No spinal cord signal abnormality. 4. And widespread associated moderate and severe cervical neural foraminal stenosis.   Electronically signed by: Helayne Hurst MD 08/10/2024 07:30 AM EDT RP Workstation: [id number]  PATIENT SURVEYS:  NDI: 12/50  Minimum Detectable Change (90% confidence): 5 points or 10% points  POSTURE: rounded shoulders and forward head  PALPATION: TTP through cervical musculature especially B scalene groups.   CERVICAL ROM:   Active ROM A/PROM (deg) eval  Flexion 75%  Extension 10%  Right lateral flexion 75%  Left lateral flexion 75%  Right rotation 50%  Left rotation 75%   (Blank rows = not tested)  UPPER EXTREMITY ROM: WFL  Active ROM Right eval Left eval  Shoulder flexion    Shoulder extension    Shoulder abduction    Shoulder adduction    Shoulder extension    Shoulder internal rotation    Shoulder external rotation    Elbow flexion  Elbow extension    Wrist flexion    Wrist extension    Wrist ulnar deviation    Wrist radial deviation    Wrist pronation    Wrist supination     (Blank rows = not tested)  UPPER EXTREMITY MMT: [facility]  MMT Right eval Left eval  Shoulder flexion    Shoulder extension    Shoulder abduction    Shoulder adduction    Shoulder extension    Shoulder internal rotation    Shoulder external rotation    Middle trapezius    Lower trapezius    Elbow flexion    Elbow extension    Wrist flexion    Wrist extension    Wrist ulnar deviation    Wrist radial deviation    Wrist pronation    Wrist supination    Grip strength     (Blank rows = not tested)  CERVICAL SPECIAL TESTS:  Neck flexor muscle endurance test: Negative and Spurling's test: Negative  FUNCTIONAL TESTS:  N/A  TREATMENT: [facility] Adult PT Treatment:                                                 DATE: 09/19/24 Therapeutic Exercise: Nustep L4 8 min Manual Therapy: B scalene stretch 30s x3 B B levator stretch 30s x2 B B UT Stretch 30s B  Therapeutic Activity: ER RTB 15x Seated hor abd RTB 15x Supine hor abd RTB 15x B, 15/15 Supine OH flexion 1# 15/15 Supine chin tuck over 1/2 roll 3x10 Supine OH flexion B with inspiration 10x   [facility] Adult PT Treatment:                                                DATE: 09/13/24 Therapeutic Exercise: Nustep level 5 x 8 min Standing Rows 2x10 RTB Shoulder Extension 2x10 RTB Upper trap stretch 2x30 Seated BIL ER RTB Seated horizontal abduction RTB 2x10 SNAGs rotation and ext ea x10 Supine horizontal adbuction fall outs 2x10 BIL GTB           [facility] Adult PT Treatment:                                                DATE: 09/11/24 Therapeutic Exercise: Nustep L2 8 min Manual Therapy: B scalene stretch 30s x3 B B levator stretch 30s x2 B Therapeutic Activity: B UT Stretch 30s B ER YTB 15x Seated hor abd YTB 15x Supine hor abd YTB 15x B, 15/15 Supine OH flexion 1# 15/15  [facility] Adult PT Treatment:                                                DATE: 09/07/24 Eval and HEP Self Care: Additional minutes spent for educating on updated Therapeutic Home Exercise Program as well as comparing current status to condition at start of symptoms. This included exercises focusing on stretching, strengthening, with focus on eccentric aspects. Long term goals include an improvement in range of motion, strength, endurance as well as avoiding reinjury. Patient's frequency would include in 1-2 times a day, 3-5 times a week for a duration of 6-12 weeks. Proper technique shown and discussed handout in great detail. All questions were discussed and addressed.      PATIENT EDUCATION:  Education details: Discussed eval findings,  rehab rationale and POC and patient is in agreement  Person educated: Patient Education method: Explanation and Handouts Education comprehension: verbalized understanding and needs further education  HOME EXERCISE PROGRAM: Access Code: [email or web address] URL: [email or web address] Date: 09/07/2024 Prepared by: Reyes Kohut  Exercises - Seated Thoracic Lumbar Extension with Pectoralis Stretch  - 1-2 x daily - 5 x weekly - 1-2 sets - 10 reps - Seated Upper Trapezius Stretch  - 1-2 x daily - 5 x weekly - 1 sets - 2 reps - 30s hold - Shoulder External Rotation and Scapular Retraction with Resistance  - 1-2 x daily - 5 x weekly - 1 sets - 10-15 reps - Standing Shoulder Horizontal Abduction with Resistance  - 1-2 x daily - 5 x weekly - 1 sets - 10-15 reps  ASSESSMENT:  CLINICAL IMPRESSION:  Continued with postural retraining, increasing resistance on t-band.  Added supine chin tucks to promote neutral posture as well as B OH flexion to promote rib excursion.  Updated HEP to include cervical retractions in supine.  EVAL: Patient is a 73 y.o. female who was seen today for physical therapy evaluation and treatment for neck pain.  Patient denies radicular symptoms or UE weakness.  Symptoms have markedly improved since onset.  She presents with a forward head and rounded shoulder posture, BUE AROM and strength WFL.  Palpation finds TTP through cervical musculature, especially B scalene groups.  Patient is a good candidate for OPPT with goal of minimizing symptoms and increasing cervical mobility.  OBJECTIVE IMPAIRMENTS: decreased activity tolerance, decreased knowledge of condition, decreased mobility, decreased ROM, increased fascial restrictions, impaired flexibility, improper body mechanics, postural dysfunction, and pain.   ACTIVITY LIMITATIONS: carrying, lifting, sleeping, bed mobility, and reading  PERSONAL FACTORS: Age, Behavior pattern, Past/current experiences, Time since onset of  injury/illness/exacerbation, and 1 comorbidity: neurosarcoidosis are also affecting patient's functional outcome.   REHAB POTENTIAL: Good  CLINICAL DECISION MAKING: Stable/uncomplicated  EVALUATION COMPLEXITY: Moderate   GOALS: Goals reviewed with patient? No  SHORT TERM GOALS: Target date: 09/28/2024    Patient to demonstrate independence in HEP  Baseline: [id number] Goal status: INITIAL    LONG TERM GOALS: Target date: 11/02/2024    Patient will score at least 7/50 on NDI to signify clinically meaningful improvement in functional abilities.   Baseline: 12/50 Goal status: INITIAL  2.  Patient will acknowledge minimal pain at least once during episode of care   Baseline: moderate with reading tasks Goal status: INITIAL  3.  Increase AROM cervical spine to 75% Baseline:  Active ROM A/PROM (deg) eval  Flexion 75%  Extension 10%  Right lateral flexion 75%  Left lateral flexion 75%  Right rotation 50%  Left rotation 75%   Goal status: INITIAL  4.  Patient will demonstrate appropriate posture when cued. Baseline: Forward head and rounded shoulders Goal status: INITIAL     PLAN:  PT FREQUENCY: 1-2x/week  PT DURATION: 4 weeks  PLANNED INTERVENTIONS: 97110-Therapeutic exercises, 97530- Therapeutic activity, 97112- Neuromuscular re-education, 97535- Self Care, [id number]- Manual therapy, 20560 (1-2 muscles), 20561 (3+ muscles)- Dry Needling, and Patient/Family education  PLAN FOR NEXT SESSION: HEP review and update, manual techniques as appropriate, aerobic tasks, ROM and flexibility activities, strengthening and PREs, TPDN, gait and balance training,aquatic therapy, modalities for pain and NMRE      Reyes CHRISTELLA Kohut, PT, SPTA 09/19/2024, 2:18 PM

## 2024-09-19 ENCOUNTER — Ambulatory Visit

## 2024-09-19 DIAGNOSIS — R293 Abnormal posture: Secondary | ICD-10-CM

## 2024-09-19 DIAGNOSIS — M542 Cervicalgia: Secondary | ICD-10-CM

## 2024-09-19 DIAGNOSIS — M6281 Muscle weakness (generalized): Secondary | ICD-10-CM | POA: Diagnosis not present

## 2024-09-21 ENCOUNTER — Ambulatory Visit

## 2024-09-21 DIAGNOSIS — M6281 Muscle weakness (generalized): Secondary | ICD-10-CM

## 2024-09-21 DIAGNOSIS — M542 Cervicalgia: Secondary | ICD-10-CM

## 2024-09-21 DIAGNOSIS — R293 Abnormal posture: Secondary | ICD-10-CM

## 2024-09-21 NOTE — Therapy (Signed)
 OUTPATIENT PHYSICAL THERAPY TREATMENT NOTE   Patient Name: Tara [name] MRN: [id number] DOB:24-Dec-1950, 73 y.o., female Today's Date: 09/21/2024  END OF SESSION:  PT End of Session - 09/21/24 1041     Visit Number 4    Number of Visits 8    Date for Recertification  11/07/24    Authorization Type MCR    PT Start Time 1045    PT Stop Time 1054    PT Time Calculation (min) 9 min    Activity Tolerance --    Behavior During Therapy --           Past Medical History:  Diagnosis Date   Hypertension    Sarcoidosis    Stroke [facility])    Past Surgical History:  Procedure Laterality Date   APPLICATION OF CRANIAL NAVIGATION N/A 01/04/2018   Procedure: APPLICATION OF CRANIAL NAVIGATION;  Surgeon: Unice Pac, MD;  Location: [facility] OR;  Service: Neurosurgery;  Laterality: N/A;   ATRIAL FIBRILLATION ABLATION N/A 04/27/2024   Procedure: ATRIAL FIBRILLATION ABLATION;  Surgeon: Inocencio Soyla Lunger, MD;  Location: [facility] INVASIVE CV LAB;  Service: Cardiovascular;  Laterality: N/A;   BRAIN SURGERY     COLONOSCOPY WITH PROPOFOL  N/A 11/20/2021   Procedure: COLONOSCOPY WITH PROPOFOL ;  Surgeon: Mansouraty, Aloha Raddle., MD;  Location: [facility] ENDOSCOPY;  Service: Gastroenterology;  Laterality: N/A;   HEMOSTASIS CLIP PLACEMENT  11/20/2021   Procedure: HEMOSTASIS CLIP PLACEMENT;  Surgeon: Wilhelmenia Aloha Raddle., MD;  Location: [facility] ENDOSCOPY;  Service: Gastroenterology;;   POLYPECTOMY  11/20/2021   Procedure: POLYPECTOMY;  Surgeon: Wilhelmenia Aloha Raddle., MD;  Location: [facility] ENDOSCOPY;  Service: Gastroenterology;;   PR DURAL GRAFT SPINAL Left 01/04/2018   Procedure: Left Pterional craniotomy for biopsy with Brainlab;  Surgeon: Unice Pac, MD;  Location: [facility] OR;  Service: Neurosurgery;  Laterality: Left;  Left Pterional craniotomy for biopsy with Brainlab   SCLEROTHERAPY  11/20/2021   Procedure: SCLEROTHERAPY;  Surgeon: Mansouraty, Aloha Raddle., MD;  Location: [facility] ENDOSCOPY;  Service: Gastroenterology;;    Patient Active Problem List   Diagnosis Date Noted   Status post ablation of atrial fibrillation 07/05/2024   Right leg pain 12/22/2022   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 (HCC) 11/24/2022   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 She fell onto her bottom, did not hit her head. Reports soreness and bruising along left upper quadrant.   Hand dominance: Right  PERTINENT HISTORY:  HPI: [name] is a 73 y.o. female presenting on 08/21/2024 for Hospitalization Follow-up (ER follow up - 08/09/24 at [facility] for headache and neck pain- is getting some better. Pain scale now is at 6 of 10. /This was following a ov to [place] for the neck pain the day prior. )   Discussed the use of  AI scribe software for clinical note transcription with the patient, who gave verbal consent to proceed.   She experiences neck pain radiating down her neck, rated as six out of ten in intensity. The pain has persisted despite previous treatments and was initially severe enough to prevent sleep for four days.   She was previously seen in the emergency room and prescribed oxycodone, which she took for three days to aid sleep. She is uncomfortable with taking narcotics but acknowledges the necessity for sleep. She also tried tizanidine, but it did not provide relief, and she did not feel there were any spasms.   The pain impacts her daily activities, including driving, as she is unable to turn her head fully. She describes herself as an active person who enjoys dancing and finds the pain limiting her ability to engage in such activities. She recounts an incident where she had to drive home from the beach and found it difficult due to the pain.   The pain began after a day of normal activities, including exercising and cleaning, and progressively worsened throughout the day. No recent changes in her sleeping arrangements or pillows, which she has used consistently for over a year.     PAIN:  Are you having pain? Yes: NPRS scale: unable to rate Pain location: R>L neck pain Pain description: ache Aggravating factors: turning Relieving factors: rest  PRECAUTIONS: None  RED FLAGS: None     WEIGHT BEARING RESTRICTIONS: No  FALLS:  Has patient fallen in last 6 months? No  OCCUPATION: retired  PLOF: Independent  PATIENT GOALS: To manage my neck pain  NEXT MD VISIT: TBD  OBJECTIVE:  Note: Objective measures were completed at Evaluation unless otherwise noted.  DIAGNOSTIC FINDINGS:  IMPRESSION: 1. No evidence of active Neurosarcoidosis in the cervical spine. 2. Ordinary but advanced cervical spine degeneration including evidence of acute exacerbation of chronic facet arthritis on the  right at C2-C3. 3. 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 No spinal cord signal abnormality. 4. And widespread associated moderate and severe cervical neural foraminal stenosis.   Electronically signed by: Helayne Hurst MD 08/10/2024 07:30 AM EDT RP Workstation: [id number]  PATIENT SURVEYS:  NDI: 12/50  Minimum Detectable Change (90% confidence): 5 points or 10% points  POSTURE: rounded shoulders and forward head  PALPATION: TTP through cervical musculature especially B scalene groups.   CERVICAL ROM:   Active ROM A/PROM (deg) eval  Flexion 75%  Extension 10%  Right lateral flexion 75%  Left lateral flexion 75%  Right rotation 50%  Left rotation 75%   (Blank rows = not tested)  UPPER EXTREMITY ROM: WFL  Active ROM Right eval Left eval  Shoulder flexion    Shoulder extension    Shoulder abduction    Shoulder adduction    Shoulder extension    Shoulder internal rotation  Shoulder external rotation    Elbow flexion    Elbow extension    Wrist flexion    Wrist extension    Wrist ulnar deviation    Wrist radial deviation    Wrist pronation    Wrist supination     (Blank rows = not tested)  UPPER EXTREMITY MMT: [facility]  MMT Right eval Left eval  Shoulder flexion    Shoulder extension    Shoulder abduction    Shoulder adduction    Shoulder extension    Shoulder internal rotation    Shoulder external rotation    Middle trapezius    Lower trapezius    Elbow flexion    Elbow extension    Wrist flexion    Wrist extension    Wrist ulnar deviation    Wrist radial deviation    Wrist pronation    Wrist supination    Grip strength     (Blank rows = not tested)  CERVICAL SPECIAL TESTS:  Neck flexor muscle endurance test: Negative and Spurling's test: Negative  FUNCTIONAL TESTS:  N/A  TREATMENT: [facility] Adult PT Treatment:                                                 DATE: 09/21/24 See assessment   [facility] Adult PT Treatment:                                                DATE: 09/19/24 Therapeutic Exercise: Nustep L4 8 min Manual Therapy: B scalene stretch 30s x3 B B levator stretch 30s x2 B B UT Stretch 30s B  Therapeutic Activity: ER RTB 15x Seated hor abd RTB 15x Supine hor abd RTB 15x B, 15/15 Supine OH flexion 1# 15/15 Supine chin tuck over 1/2 roll 3x10 Supine OH flexion B with inspiration 10x   [facility] Adult PT Treatment:                                                DATE: 09/13/24 Therapeutic Exercise: Nustep level 5 x 8 min Standing Rows 2x10 RTB Shoulder Extension 2x10 RTB Upper trap stretch 2x30 Seated BIL ER RTB Seated horizontal abduction RTB 2x10 SNAGs rotation and ext ea x10 Supine horizontal adbuction fall outs 2x10 BIL GTB           [facility] Adult PT Treatment:                                                DATE: 09/11/24 Therapeutic Exercise: Nustep L2 8 min Manual Therapy: B scalene stretch 30s x3 B B levator stretch 30s x2 B Therapeutic Activity: B UT Stretch 30s B ER YTB 15x Seated hor abd YTB 15x Supine hor abd YTB 15x B, 15/15 Supine OH flexion 1# 15/15  [facility] Adult PT Treatment:                                                DATE: 09/07/24 Eval and HEP Self Care: Additional minutes spent for educating on updated Therapeutic Home Exercise Program as well as comparing current status to condition at start of symptoms. This included exercises focusing on stretching, strengthening, with focus on eccentric aspects. Long term goals include an improvement in range of motion, strength, endurance as well as avoiding reinjury. Patient's frequency would include in 1-2 times a day, 3-5 times a week for a duration of 6-12 weeks. Proper technique shown and discussed handout in great detail. All questions  were discussed and addressed.      PATIENT EDUCATION:  Education details: Discussed eval findings, rehab rationale and POC and patient is in agreement  Person educated: Patient Education method: Explanation and Handouts Education comprehension: verbalized understanding and needs further education  HOME EXERCISE PROGRAM: Access Code: [email or web address] URL: [email or web address] Date: 09/07/2024 Prepared by: Reyes Kohut  Exercises - Seated Thoracic Lumbar Extension with Pectoralis Stretch  - 1-2 x daily - 5 x weekly - 1-2 sets - 10 reps - Seated Upper Trapezius Stretch  - 1-2 x daily - 5 x weekly - 1 sets - 2 reps - 30s hold - Shoulder External Rotation and Scapular Retraction with Resistance  - 1-2 x daily - 5 x weekly - 1 sets - 10-15 reps - Standing Shoulder Horizontal Abduction with Resistance  - 1-2 x daily - 5 x weekly - 1 sets - 10-15 reps  ASSESSMENT:  CLINICAL IMPRESSION:   Patient presents to PT reporting she had a fall within the past 24 hours. She reported that she fell onto her bottom, she did not hit her head. Patient reports that she is having left side pain, along with bruising along the left upper quadrant. Held physical therapy for today. Advised to follow up with MD or UC if needed. Arrived no charge.  EVAL: Patient is a 73 y.o. female who was seen today for physical therapy evaluation and treatment for neck pain.  Patient denies radicular symptoms or UE weakness.  Symptoms have markedly improved since onset.  She presents with a forward head and rounded shoulder posture, BUE AROM and strength WFL.  Palpation finds TTP through cervical musculature, especially B scalene groups.  Patient is a good candidate for OPPT with goal of minimizing symptoms and increasing cervical mobility.  OBJECTIVE IMPAIRMENTS: decreased activity tolerance, decreased knowledge of condition, decreased mobility, decreased ROM, increased fascial restrictions, impaired flexibility, improper  body mechanics, postural dysfunction, and pain.   ACTIVITY LIMITATIONS: carrying, lifting, sleeping, bed mobility, and reading  PERSONAL FACTORS: Age, Behavior pattern, Past/current experiences, Time since onset of injury/illness/exacerbation, and 1 comorbidity: neurosarcoidosis are also affecting patient's functional outcome.   REHAB POTENTIAL: Good  CLINICAL DECISION MAKING: Stable/uncomplicated  EVALUATION COMPLEXITY: Moderate   GOALS: Goals reviewed with patient? No  SHORT TERM GOALS: Target date: 09/28/2024    Patient to demonstrate independence in HEP  Baseline: [id number] Goal status: INITIAL    LONG TERM GOALS: Target date: 11/02/2024    Patient will score at least 7/50 on NDI to signify clinically meaningful improvement in functional abilities.   Baseline: 12/50 Goal status: INITIAL  2.  Patient will acknowledge minimal pain at least once during episode of care  Baseline: moderate with reading tasks Goal status: INITIAL  3.  Increase AROM cervical spine to 75% Baseline:  Active ROM A/PROM (deg) eval  Flexion 75%  Extension 10%  Right lateral flexion 75%  Left lateral flexion 75%  Right rotation 50%  Left rotation 75%   Goal status: INITIAL  4.  Patient will demonstrate appropriate posture when cued. Baseline: Forward head and rounded shoulders Goal status: INITIAL     PLAN:  PT FREQUENCY: 1-2x/week  PT DURATION: 4 weeks  PLANNED INTERVENTIONS: 97110-Therapeutic exercises, 97530- Therapeutic activity, 97112- Neuromuscular re-education, 97535- Self Care, [id number]- Manual therapy, 20560 (1-2 muscles), 20561 (3+ muscles)- Dry Needling, and Patient/Family education  PLAN FOR NEXT SESSION: HEP review and update, manual techniques as appropriate, aerobic tasks, ROM and flexibility activities, strengthening and PREs, TPDN, gait and balance training,aquatic therapy, modalities for pain and NMRE      Shanda Code, SPTA 09/21/2024, 10:56 AM

## 2024-09-21 NOTE — Progress Notes (Signed)
 [facility] - Clinic Note  10/04/2024   CHIEF COMPLAINT Patient presents for Retina Follow Up   HISTORY OF PRESENT ILLNESS: Tara [name] is a 73 y.o. female who presents to the clinic today for:  HPI     Retina Follow Up   Patient presents with  Dry AMD.  In both eyes.  Severity is moderate.  Duration of 4 months.  Since onset it is stable.  I, the attending physician,  performed the HPI with the patient and updated documentation appropriately.        Comments   Pt here for 4 mo f/u non exu ARMD OU. Pt states vision is stable. No changes she's noticed. Interested in light therapy for dry ARMD OU.       Last edited by Valdemar Rogue, MD on 10/05/2024  9:15 PM.     Pt states the vision is about the same.   Referring physician: Rollene Almarie LABOR, MD [address] [place],  [place] [address]  HISTORICAL INFORMATION:  Selected notes from the MEDICAL RECORD NUMBER Self referral for non-exu ARMD OU - previously followed at [facility] w/ Drs. Maranda and Maree LEE:  Ocular Hx- PMH-   CURRENT MEDICATIONS: No current outpatient medications on file. (Ophthalmic Drugs)   No current facility-administered medications for this visit. (Ophthalmic Drugs)   Current Outpatient Medications (Other)  Medication Sig   alendronate  (FOSAMAX ) 70 MG tablet TAKE 1 TABLET BY MOUTH ONCE WEEKLY WITH A FULL GLASS OF WATER ON AN EMPTY STOMACH   aspirin  EC 81 MG tablet Take 1 tablet (81 mg total) by mouth daily.   diltiazem  (CARDIZEM  CD) 180 MG 24 hr capsule Take 2 capsules (360 mg total) by mouth daily.   ELIQUIS  5 MG TABS tablet TAKE 1 TABLET BY MOUTH 2 TIMES A DAY   hydrochlorothiazide  (HYDRODIURIL ) 25 MG tablet TAKE 1 TABLET BY MOUTH DAILY   metFORMIN  (GLUCOPHAGE ) 500 MG tablet TAKE 1 TABLET BY MOUTH DAILY WITH BREAKFAST   metoprolol  tartrate (LOPRESSOR ) 25 MG tablet TAKE 1 TABLET BY MOUTH AS NEEDED FOR HEART RATE GREATER THEN 100)   Multiple Vitamin (MULTIVITAMIN WITH  MINERALS) TABS tablet Take 1 tablet by mouth daily.   Multiple Vitamins-Minerals (PRESERVISION AREDS PO) Take 1 capsule by mouth in the morning and at bedtime. Unknown strenght   rosuvastatin  (CRESTOR ) 10 MG tablet TAKE 1 TABLET BY MOUTH DAILY   valsartan  (DIOVAN ) 40 MG tablet TAKE 1 TABLET BY MOUTH DAILY   oxyCODONE  (ROXICODONE ) 5 MG immediate release tablet Take 1 tablet (5 mg total) by mouth every 4 (four) hours as needed for severe pain (pain score 7-10).   tiZANidine  (ZANAFLEX ) 4 MG tablet Take 1 tablet (4 mg total) by mouth every 6 (six) hours as needed for muscle spasms. (Patient not taking: Reported on 08/21/2024)   No current facility-administered medications for this visit. (Other)   REVIEW OF SYSTEMS: ROS   Positive for: Gastrointestinal, Endocrine, Cardiovascular (AFIB) Negative for: Constitutional, Neurological, Skin, Genitourinary, Musculoskeletal, HENT, Eyes, Respiratory, Psychiatric, Allergic/Imm, Heme/Lymph Last edited by Antonetta Almetta BRAVO, COT on 10/04/2024  8:53 AM.      ALLERGIES Allergies  Allergen Reactions   Ace Inhibitors Cough   Ether Other (See Comments)    unknown   PAST MEDICAL HISTORY Past Medical History:  Diagnosis Date   Hypertension    Sarcoidosis    Stroke [facility])    Past Surgical History:  Procedure Laterality Date   APPLICATION OF CRANIAL NAVIGATION N/A 01/04/2018  Procedure: APPLICATION OF CRANIAL NAVIGATION;  Surgeon: Unice Pac, MD;  Location: [facility] OR;  Service: Neurosurgery;  Laterality: N/A;   ATRIAL FIBRILLATION ABLATION N/A 04/27/2024   Procedure: ATRIAL FIBRILLATION ABLATION;  Surgeon: Inocencio Soyla Lunger, MD;  Location: [facility] INVASIVE CV LAB;  Service: Cardiovascular;  Laterality: N/A;   BRAIN SURGERY     COLONOSCOPY WITH PROPOFOL  N/A 11/20/2021   Procedure: COLONOSCOPY WITH PROPOFOL ;  Surgeon: Mansouraty, Aloha Raddle., MD;  Location: [facility] ENDOSCOPY;  Service: Gastroenterology;  Laterality: N/A;   HEMOSTASIS CLIP PLACEMENT  11/20/2021    Procedure: HEMOSTASIS CLIP PLACEMENT;  Surgeon: Wilhelmenia Aloha Raddle., MD;  Location: [facility] ENDOSCOPY;  Service: Gastroenterology;;   POLYPECTOMY  11/20/2021   Procedure: POLYPECTOMY;  Surgeon: Wilhelmenia Aloha Raddle., MD;  Location: [facility] ENDOSCOPY;  Service: Gastroenterology;;   PR DURAL GRAFT SPINAL Left 01/04/2018   Procedure: Left Pterional craniotomy for biopsy with Brainlab;  Surgeon: Unice Pac, MD;  Location: [facility] OR;  Service: Neurosurgery;  Laterality: Left;  Left Pterional craniotomy for biopsy with Brainlab   SCLEROTHERAPY  11/20/2021   Procedure: SCLEROTHERAPY;  Surgeon: Mansouraty, Aloha Raddle., MD;  Location: [facility] ENDOSCOPY;  Service: Gastroenterology;;   FAMILY HISTORY Family History  Problem Relation Age of Onset   [name] cancer Mother    Asthma Mother        Had as a child   Heart disease Mother    Heart failure Mother    [name] cancer Father    CAD Father    Cancer Father    Diabetes Father    Heart disease Father    Hypertension Father    Diabetes Sister    Macular degeneration Sister    Diabetes Brother    SOCIAL HISTORY Social History   Tobacco Use   Smoking status: Former    Current packs/day: 0.00    Types: Cigarettes    Quit date: 12/31/2017    Years since quitting: 6.7   Smokeless tobacco: Never   Tobacco comments:    Former smoker 05/25/24  Vaping Use   Vaping status: Never Used  Substance Use Topics   Alcohol use: No   Drug use: No       OPHTHALMIC EXAM:  Base Eye Exam     Visual Acuity (Snellen - Linear)       Right Left   Dist [place] 20/50 20/40   Dist ph [place] 20/25 -2 20/30 -2         Tonometry (Tonopen, 9:01 AM)       Right Left   Pressure 15 13         Pupils       Pupils Dark Light Shape React APD   Right PERRL 3 2 Round Brisk None   Left PERRL              Visual Fields (Counting fingers)       Left Right    Full Full         Extraocular Movement       Right Left    Full, Ortho Full, Ortho         Neuro/Psych      Oriented x3: Yes         Dilation     Both eyes: 1.0% Mydriacyl, 2.5% Phenylephrine  @ 9:01 AM           Slit Lamp and Fundus Exam     Slit Lamp Exam       Right Left   Lids/Lashes Dermatochalasis -  upper lid Dermatochalasis - upper lid   Conjunctiva/Sclera nasal pingeucula nasal pingeucula   Cornea well healed cataract wound, trace tear film debris, 1-2+ Punctate epithelial erosions arcus, well healed cataract wound, 1-2+ Punctate epithelial erosions   Anterior Chamber deep and clear deep and clear   Iris Round and dilated, No NVI Round and dilated, No NVI   Lens PC IOL in good position, 1+ non-central Posterior capsular opacification approaching central visual axis PC IOL in good position, trace PCO   Anterior Vitreous Posterior vitreous detachment Posterior vitreous detachment         Fundus Exam       Right Left   Disc Pink and Sharp, PPA Pink and Sharp, PPA, mild tilt   C/D Ratio 0.2 0.3   Macula Flat, Blunted foveal reflex, scattered GA with central sparing and pigment clumping Flat, Blunted foveal reflex, diffuse GA with central sparring and pigment clumping, Drusen   Vessels attenuated, mild tortuosity mild attenuation, mild tortuosity   Periphery Attached, No heme, Reticular degeneration Attached, No heme           IMAGING AND PROCEDURES  Imaging and Procedures for 10/04/2024  OCT, Retina - OU - Both Eyes       Right Eye Quality was good. Central Foveal Thickness: 222. Progression has been stable. Findings include normal foveal contour, no IRF, no SRF, retinal drusen , outer retinal atrophy (Diffuse ORA / GA w/ central sparring).   Left Eye Quality was good. Central Foveal Thickness: 240. Progression has been stable. Findings include normal foveal contour, no IRF, retinal drusen , outer retinal atrophy (Diffuse ORA / GA w/ central sparring).   Notes *Images captured and stored on drive  Diagnosis / Impression:  NFP, no IRF / SRF OU, Diffuse ORA  / GA w/ central sparing OS: Diffuse ORA / GA w/ central sparing  Clinical management:  See below  Abbreviations: NFP - Normal foveal profile. CME - cystoid macular edema. PED - pigment epithelial detachment. IRF - intraretinal fluid. SRF - subretinal fluid. EZ - ellipsoid zone. ERM - epiretinal membrane. ORA - outer retinal atrophy. ORT - outer retinal tubulation. SRHM - subretinal hyper-reflective material. IRHM - intraretinal hyper-reflective material           ASSESSMENT/PLAN:   ICD-10-CM   1. 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  H35.3133 OCT, Retina - OU - Both Eyes    2. Diabetes mellitus type 2 without retinopathy (HCC)  E11.9     3. Long term (current) use of oral hypoglycemic drugs  Z79.84     4. Essential hypertension  I10     5. Hypertensive retinopathy of both eyes  H35.033     6. Pseudophakia, both eyes  Z96.1     7. Bilateral dry eyes  H04.123      Age related macular degeneration, non-exudative, both eyes - advanced stage w/o subfoveal involvement - stable from prior - OCT shows diffuse ORA/GA w/ central sparing OU - stable - The incidence, anatomy, and pathology of dry AMD, risk of progression, and the AREDS and AREDS 2 study including smoking risks discussed with patient.  - Recommend amsler grid monitoring  - f/u 6-9 months, DFE, OCT  2,3. Diabetes mellitus, type 2 without retinopathy - The incidence, risk factors for progression, natural history and treatment options for diabetic retinopathy  were discussed with patient.   - The need for close monitoring of blood glucose, blood pressure, and serum lipids,  avoiding cigarette or any type of tobacco, and the need for long term follow up was also discussed with patient. - f/u in 1 year, sooner prn  4,5. Hypertensive retinopathy OU - discussed importance of tight BP control - monitor  6. Pseudophakia OU  - s/p CE/IOL (Dr.S. Groat)  -  IOL in good position, doing well  - monitor  7. Dry eyes OU - recommend artificial tears and lubricating ointment as needed   Ophthalmic Meds Ordered this visit:  No orders of the defined types were placed in this encounter.    Return in about 6 months (around 04/04/2025) for f/u, Non Ex. AMD, DFE, OCT.  There are no Patient Instructions on file for this visit.  Explained the diagnoses, plan, and follow up with the patient and they expressed understanding.  Patient expressed understanding of the importance of proper follow up care.   This document serves as a record of services personally performed by Redell JUDITHANN Hans, MD, PhD. It was created on their behalf by Almetta Pesa, an ophthalmic technician. The creation of this record is the provider's dictation and/or activities during the visit.    Electronically signed by: Almetta Pesa, OA, 10/05/24  9:19 PM  This document serves as a record of services personally performed by Redell JUDITHANN Hans, MD, PhD. It was created on their behalf by Wanda GEANNIE Keens, COT an ophthalmic technician. The creation of this record is the provider's dictation and/or activities during the visit.    Electronically signed by:  Wanda GEANNIE Keens, COT  10/05/24 9:19 PM  Redell JUDITHANN Hans, M.D., Ph.D. Diseases & Surgery of the Retina and Vitreous [facility] 10/04/2024  I have reviewed the above documentation for accuracy and completeness, and I agree with the above. Redell JUDITHANN Hans, M.D., Ph.D. 10/05/24 9:20 PM   Abbreviations: M myopia (nearsighted); A astigmatism; H hyperopia (farsighted); P presbyopia; Mrx spectacle prescription;  CTL contact lenses; OD right eye; OS left eye; OU both eyes  XT exotropia; ET esotropia; PEK punctate epithelial keratitis; PEE punctate epithelial erosions; DES dry eye syndrome; MGD meibomian gland dysfunction; ATs artificial tears; PFAT's preservative free artificial tears; NSC nuclear sclerotic  cataract; PSC posterior subcapsular cataract; ERM epi-retinal membrane; PVD posterior vitreous detachment; RD retinal detachment; DM diabetes mellitus; DR diabetic retinopathy; NPDR non-proliferative diabetic retinopathy; PDR proliferative diabetic retinopathy; CSME clinically significant macular edema; DME diabetic macular edema; dbh dot blot hemorrhages; CWS cotton wool spot; POAG primary open angle glaucoma; C/D cup-to-disc ratio; HVF humphrey visual field; GVF goldmann visual field; OCT optical coherence tomography; IOP intraocular pressure; BRVO Branch retinal vein occlusion; CRVO central retinal vein occlusion; CRAO central retinal artery occlusion; BRAO branch retinal artery occlusion; RT retinal tear; SB scleral buckle; PPV pars plana vitrectomy; VH Vitreous hemorrhage; PRP panretinal laser photocoagulation; IVK intravitreal kenalog ; VMT vitreomacular traction; MH Macular hole;  NVD neovascularization of the disc; NVE neovascularization elsewhere; AREDS age related eye disease study; ARMD age related macular degeneration; POAG primary open angle glaucoma; EBMD epithelial/anterior basement membrane dystrophy; ACIOL anterior chamber intraocular lens; IOL intraocular lens; PCIOL posterior chamber intraocular lens; Phaco/IOL phacoemulsification with intraocular lens placement; PRK photorefractive keratectomy; LASIK laser assisted in situ keratomileusis; HTN hypertension; DM diabetes mellitus; COPD chronic obstructive pulmonary disease

## 2024-09-25 ENCOUNTER — Ambulatory Visit

## 2024-09-25 DIAGNOSIS — R293 Abnormal posture: Secondary | ICD-10-CM | POA: Diagnosis not present

## 2024-09-25 DIAGNOSIS — M6281 Muscle weakness (generalized): Secondary | ICD-10-CM | POA: Diagnosis not present

## 2024-09-25 DIAGNOSIS — M542 Cervicalgia: Secondary | ICD-10-CM

## 2024-09-25 NOTE — Therapy (Signed)
 OUTPATIENT PHYSICAL THERAPY TREATMENT NOTE   Patient Name: Tara [name] MRN: [id number] DOB:01/29/1951, 73 y.o., female Today's Date: 09/25/2024  END OF SESSION:  PT End of Session - 09/25/24 1132     Visit Number 5    Number of Visits 8    Date for Recertification  11/07/24    Authorization Type MCR    PT Start Time 1130    PT Stop Time 1210    PT Time Calculation (min) 40 min    Activity Tolerance Patient tolerated treatment well    Behavior During Therapy [facility] for tasks assessed/performed            Past Medical History:  Diagnosis Date   Hypertension    Sarcoidosis    Stroke [facility]    Past Surgical History:  Procedure Laterality Date   APPLICATION OF CRANIAL NAVIGATION N/A 01/04/2018   Procedure: APPLICATION OF CRANIAL NAVIGATION;  Surgeon: Unice Pac, MD;  Location: [facility] OR;  Service: Neurosurgery;  Laterality: N/A;   ATRIAL FIBRILLATION ABLATION N/A 04/27/2024   Procedure: ATRIAL FIBRILLATION ABLATION;  Surgeon: Inocencio Soyla Lunger, MD;  Location: [facility] INVASIVE CV LAB;  Service: Cardiovascular;  Laterality: N/A;   BRAIN SURGERY     COLONOSCOPY WITH PROPOFOL  N/A 11/20/2021   Procedure: COLONOSCOPY WITH PROPOFOL ;  Surgeon: Mansouraty, Aloha Raddle., MD;  Location: [facility] ENDOSCOPY;  Service: Gastroenterology;  Laterality: N/A;   HEMOSTASIS CLIP PLACEMENT  11/20/2021   Procedure: HEMOSTASIS CLIP PLACEMENT;  Surgeon: Wilhelmenia Aloha Raddle., MD;  Location: [facility] ENDOSCOPY;  Service: Gastroenterology;;   POLYPECTOMY  11/20/2021   Procedure: POLYPECTOMY;  Surgeon: Wilhelmenia Aloha Raddle., MD;  Location: [facility] ENDOSCOPY;  Service: Gastroenterology;;   PR DURAL GRAFT SPINAL Left 01/04/2018   Procedure: Left Pterional craniotomy for biopsy with Brainlab;  Surgeon: Unice Pac, MD;  Location: [facility] OR;  Service: Neurosurgery;  Laterality: Left;  Left Pterional craniotomy for biopsy with Brainlab   SCLEROTHERAPY  11/20/2021   Procedure: SCLEROTHERAPY;  Surgeon: Mansouraty, Aloha Raddle., MD;   Location: [facility] ENDOSCOPY;  Service: Gastroenterology;;   Patient Active Problem List   Diagnosis Date Noted   Status post ablation of atrial fibrillation 07/05/2024   Right leg pain 12/22/2022   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 (HCC) 11/24/2022   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  LUE still sore from previous fall.  Hand dominance: Right  PERTINENT HISTORY:  HPI: Tara [name] is a 73 y.o. female presenting on 08/21/2024 for Hospitalization Follow-up (ER follow up - 08/09/24 at [facility] for headache and neck pain- is getting some better. Pain scale now is at 6 of 10. /This was following a ov to [place] for the neck pain the day prior. )   Discussed the use of AI  scribe software for clinical note transcription with the patient, who gave verbal consent to proceed.   She experiences neck pain radiating down her neck, rated as six out of ten in intensity. The pain has persisted despite previous treatments and was initially severe enough to prevent sleep for four days.   She was previously seen in the emergency room and prescribed oxycodone , which she took for three days to aid sleep. She is uncomfortable with taking narcotics but acknowledges the necessity for sleep. She also tried tizanidine , but it did not provide relief, and she did not feel there were any spasms.   The pain impacts her daily activities, including driving, as she is unable to turn her head fully. She describes herself as an active person who enjoys dancing and finds the pain limiting her ability to engage in such activities. She recounts an incident where she had to drive home from the beach and found it difficult due to the pain.   The pain began after a day of normal activities, including exercising and cleaning, and progressively worsened throughout the day. No recent changes in her sleeping arrangements or pillows, which she has used consistently for over a year.     PAIN:  Are you having pain? Yes: NPRS scale: unable to rate Pain location: R>L neck pain Pain description: ache Aggravating factors: turning Relieving factors: rest  PRECAUTIONS: None  RED FLAGS: None     WEIGHT BEARING RESTRICTIONS: No  FALLS:  Has patient fallen in last 6 months? No  OCCUPATION: retired  PLOF: Independent  PATIENT GOALS: To manage my neck pain  NEXT MD VISIT: TBD  OBJECTIVE:  Note: Objective measures were completed at Evaluation unless otherwise noted.  DIAGNOSTIC FINDINGS:  IMPRESSION: 1. No evidence of active Neurosarcoidosis in the cervical spine. 2. Ordinary but advanced cervical spine degeneration including evidence of acute exacerbation of chronic facet arthritis on the  right at C2-C3. 3. 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 No spinal cord signal abnormality. 4. And widespread associated moderate and severe cervical neural foraminal stenosis.   Electronically signed by: Helayne Hurst MD 08/10/2024 07:30 AM EDT RP Workstation: [id number]  PATIENT SURVEYS:  NDI: 12/50  Minimum Detectable Change (90% confidence): 5 points or 10% points  POSTURE: rounded shoulders and forward head  PALPATION: TTP through cervical musculature especially B scalene groups.   CERVICAL ROM:   Active ROM A/PROM (deg) eval  Flexion 75%  Extension 10%  Right lateral flexion 75%  Left lateral flexion 75%  Right rotation 50%  Left rotation 75%   (Blank rows = not tested)  UPPER EXTREMITY ROM: WFL  Active ROM Right eval Left eval  Shoulder flexion    Shoulder extension    Shoulder abduction    Shoulder adduction    Shoulder extension    Shoulder internal rotation    Shoulder external rotation    Elbow flexion  Elbow extension    Wrist flexion    Wrist extension    Wrist ulnar deviation    Wrist radial deviation    Wrist pronation    Wrist supination     (Blank rows = not tested)  UPPER EXTREMITY MMT: [facility]  MMT Right eval Left eval  Shoulder flexion    Shoulder extension    Shoulder abduction    Shoulder adduction    Shoulder extension    Shoulder internal rotation    Shoulder external rotation    Middle trapezius    Lower trapezius    Elbow flexion    Elbow extension    Wrist flexion    Wrist extension    Wrist ulnar deviation    Wrist radial deviation    Wrist pronation    Wrist supination    Grip strength     (Blank rows = not tested)  CERVICAL SPECIAL TESTS:  Neck flexor muscle endurance test: Negative and Spurling's test: Negative  FUNCTIONAL TESTS:  N/A  TREATMENT: [facility] Adult PT Treatment:                                                 DATE: 09/25/24 Therapeutic Exercise: Nustep L4 8 min Manual Therapy: B scalene stretch 30s x3 B B levator stretch 30s x2 R only B UT Stretch 30s x2 B Therapeutic Activity: ER RTB 15x Seated hor abd RTB 15x Supine hor abd RTB 15x B, 15/15 Supine OH flexion 1# 15/15 Supine chin tuck over 1/2 roll 3x10  [facility] Adult PT Treatment:                                                DATE: 09/21/24 See assessment   [facility] Adult PT Treatment:                                                DATE: 09/19/24 Therapeutic Exercise: Nustep L4 8 min Manual Therapy: B scalene stretch 30s x3 B B levator stretch 30s x2 B B UT Stretch 30s B  Therapeutic Activity: ER RTB 15x Seated hor abd RTB 15x Supine hor abd RTB 15x B, 15/15 Supine OH flexion 1# 15/15 Supine chin tuck over 1/2 roll 3x10 Supine OH flexion B with inspiration 10x   [facility] Adult PT Treatment:                                                DATE: 09/13/24 Therapeutic Exercise: Nustep level 5 x 8 min Standing Rows 2x10 RTB Shoulder Extension 2x10 RTB Upper trap stretch 2x30 Seated BIL ER RTB Seated horizontal abduction RTB 2x10 SNAGs rotation and ext ea x10 Supine horizontal adbuction fall outs 2x10 BIL GTB           [facility] Adult PT Treatment:  DATE: 09/11/24 Therapeutic Exercise: Nustep L2 8 min Manual Therapy: B scalene stretch 30s x3 B B levator stretch 30s x2 B Therapeutic Activity: B UT Stretch 30s B ER YTB 15x Seated hor abd YTB 15x Supine hor abd YTB 15x B, 15/15 Supine OH flexion 1# 15/15                                                                                                                  [facility] Adult PT Treatment:                                                DATE: 09/07/24 Eval and HEP Self Care: Additional minutes spent for educating on updated Therapeutic Home Exercise Program as well as comparing current status to condition at start of  symptoms. This included exercises focusing on stretching, strengthening, with focus on eccentric aspects. Long term goals include an improvement in range of motion, strength, endurance as well as avoiding reinjury. Patient's frequency would include in 1-2 times a day, 3-5 times a week for a duration of 6-12 weeks. Proper technique shown and discussed handout in great detail. All questions were discussed and addressed.      PATIENT EDUCATION:  Education details: Discussed eval findings, rehab rationale and POC and patient is in agreement  Person educated: Patient Education method: Explanation and Handouts Education comprehension: verbalized understanding and needs further education  HOME EXERCISE PROGRAM: Access Code: [email or web address] URL: [email or web address] Date: 09/07/2024 Prepared by: Reyes Kohut  Exercises - Seated Thoracic Lumbar Extension with Pectoralis Stretch  - 1-2 x daily - 5 x weekly - 1-2 sets - 10 reps - Seated Upper Trapezius Stretch  - 1-2 x daily - 5 x weekly - 1 sets - 2 reps - 30s hold - Shoulder External Rotation and Scapular Retraction with Resistance  - 1-2 x daily - 5 x weekly - 1 sets - 10-15 reps - Standing Shoulder Horizontal Abduction with Resistance  - 1-2 x daily - 5 x weekly - 1 sets - 10-15 reps  ASSESSMENT:  CLINICAL IMPRESSION:  Focus of session was reproducing prior session to assess activity tolerance following recent fall with L upper quadrant strain/contusion.  Able to complete previous session's tasks with only mild discomfort which improved with stretching and mobility.  Unable to tolerate L levator stretch due to shoulder discomfort.  EVAL: Patient is a 73 y.o. female who was seen today for physical therapy evaluation and treatment for neck pain.  Patient denies radicular symptoms or UE weakness.  Symptoms have markedly improved since onset.  She presents with a forward head and rounded shoulder posture, BUE AROM and strength WFL.  Palpation  finds TTP through cervical musculature, especially B scalene groups.  Patient is a good candidate for OPPT with goal of minimizing symptoms and increasing cervical mobility.  OBJECTIVE IMPAIRMENTS: decreased activity tolerance, decreased knowledge of condition, decreased  mobility, decreased ROM, increased fascial restrictions, impaired flexibility, improper body mechanics, postural dysfunction, and pain.   ACTIVITY LIMITATIONS: carrying, lifting, sleeping, bed mobility, and reading  PERSONAL FACTORS: Age, Behavior pattern, Past/current experiences, Time since onset of injury/illness/exacerbation, and 1 comorbidity: neurosarcoidosis are also affecting patient's functional outcome.   REHAB POTENTIAL: Good  CLINICAL DECISION MAKING: Stable/uncomplicated  EVALUATION COMPLEXITY: Moderate   GOALS: Goals reviewed with patient? No  SHORT TERM GOALS: Target date: 09/28/2024    Patient to demonstrate independence in HEP  Baseline: [id number] Goal status: INITIAL    LONG TERM GOALS: Target date: 11/02/2024    Patient will score at least 7/50 on NDI to signify clinically meaningful improvement in functional abilities.   Baseline: 12/50 Goal status: INITIAL  2.  Patient will acknowledge minimal pain at least once during episode of care   Baseline: moderate with reading tasks Goal status: INITIAL  3.  Increase AROM cervical spine to 75% Baseline:  Active ROM A/PROM (deg) eval  Flexion 75%  Extension 10%  Right lateral flexion 75%  Left lateral flexion 75%  Right rotation 50%  Left rotation 75%   Goal status: INITIAL  4.  Patient will demonstrate appropriate posture when cued. Baseline: Forward head and rounded shoulders Goal status: INITIAL     PLAN:  PT FREQUENCY: 1-2x/week  PT DURATION: 4 weeks  PLANNED INTERVENTIONS: 97110-Therapeutic exercises, 97530- Therapeutic activity, 97112- Neuromuscular re-education, 97535- Self Care, [id number]- Manual therapy, 20560 (1-2  muscles), 20561 (3+ muscles)- Dry Needling, and Patient/Family education  PLAN FOR NEXT SESSION: HEP review and update, manual techniques as appropriate, aerobic tasks, ROM and flexibility activities, strengthening and PREs, TPDN, gait and balance training,aquatic therapy, modalities for pain and NMRE      Reyes CHRISTELLA Kohut, PT, SPTA 09/25/2024, 12:18 PM

## 2024-10-02 NOTE — Therapy (Signed)
 OUTPATIENT PHYSICAL THERAPY TREATMENT NOTE   Patient Name: Tara [name] MRN: [id number] DOB:[birth year], 73 y.o., female Today's Date: 10/03/2024  END OF SESSION:  PT End of Session - 10/03/24 1040     Visit Number 6    Number of Visits 8    Date for Recertification  11/07/24    Authorization Type MCR    PT Start Time 1045    PT Stop Time 1125    PT Time Calculation (min) 40 min    Activity Tolerance Patient tolerated treatment well    Behavior During Therapy [facility] for tasks assessed/performed          Past Medical History:  Diagnosis Date   Hypertension    Sarcoidosis    Stroke [facility])    Past Surgical History:  Procedure Laterality Date   APPLICATION OF CRANIAL NAVIGATION N/A 01/04/2018   Procedure: APPLICATION OF CRANIAL NAVIGATION;  Surgeon: Unice Pac, MD;  Location: [facility] OR;  Service: Neurosurgery;  Laterality: N/A;   ATRIAL FIBRILLATION ABLATION N/A 04/27/2024   Procedure: ATRIAL FIBRILLATION ABLATION;  Surgeon: Inocencio Soyla Lunger, MD;  Location: [facility] INVASIVE CV LAB;  Service: Cardiovascular;  Laterality: N/A;   BRAIN SURGERY     COLONOSCOPY WITH PROPOFOL  N/A 11/20/2021   Procedure: COLONOSCOPY WITH PROPOFOL ;  Surgeon: Mansouraty, Aloha Raddle., MD;  Location: [facility] ENDOSCOPY;  Service: Gastroenterology;  Laterality: N/A;   HEMOSTASIS CLIP PLACEMENT  11/20/2021   Procedure: HEMOSTASIS CLIP PLACEMENT;  Surgeon: Wilhelmenia Aloha Raddle., MD;  Location: [facility] ENDOSCOPY;  Service: Gastroenterology;;   POLYPECTOMY  11/20/2021   Procedure: POLYPECTOMY;  Surgeon: Wilhelmenia Aloha Raddle., MD;  Location: [facility] ENDOSCOPY;  Service: Gastroenterology;;   PR DURAL GRAFT SPINAL Left 01/04/2018   Procedure: Left Pterional craniotomy for biopsy with Brainlab;  Surgeon: Unice Pac, MD;  Location: [facility] OR;  Service: Neurosurgery;  Laterality: Left;  Left Pterional craniotomy for biopsy with Brainlab   SCLEROTHERAPY  11/20/2021   Procedure: SCLEROTHERAPY;  Surgeon: Mansouraty, Aloha Raddle., MD;   Location: [facility] ENDOSCOPY;  Service: Gastroenterology;;   Patient Active Problem List   Diagnosis Date Noted   Status post ablation of atrial fibrillation 07/05/2024   Right leg pain 12/22/2022   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 (HCC) 11/24/2022   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 Having some soreness around the ribs but states that it is tolerable.  Hand dominance: Right  PERTINENT HISTORY:  HPI: Tara [name] is a 73 y.o. female presenting on 08/21/2024 for Hospitalization Follow-up (ER follow up - 08/09/24 at [facility] for headache and neck pain- is getting some better. Pain scale now is at 6 of 10. /This was following a ov to [place] for the neck pain the day prior. )    Discussed the use of AI scribe software for clinical note transcription with the patient, who gave verbal consent to proceed.   She experiences neck pain radiating down her neck, rated as six out of ten in intensity. The pain has persisted despite previous treatments and was initially severe enough to prevent sleep for four days.   She was previously seen in the emergency room and prescribed oxycodone , which she took for three days to aid sleep. She is uncomfortable with taking narcotics but acknowledges the necessity for sleep. She also tried tizanidine , but it did not provide relief, and she did not feel there were any spasms.   The pain impacts her daily activities, including driving, as she is unable to turn her head fully. She describes herself as an active person who enjoys dancing and finds the pain limiting her ability to engage in such activities. She recounts an incident where she had to drive home from the beach and found it difficult due to the pain.   The pain began after a day of normal activities, including exercising and cleaning, and progressively worsened throughout the day. No recent changes in her sleeping arrangements or pillows, which she has used consistently for over a year.     PAIN:  Are you having pain? Yes: NPRS scale: unable to rate Pain location: R>L neck pain Pain description: ache Aggravating factors: turning Relieving factors: rest  PRECAUTIONS: None  RED FLAGS: None     WEIGHT BEARING RESTRICTIONS: No  FALLS:  Has patient fallen in last 6 months? No  OCCUPATION: retired  PLOF: Independent  PATIENT GOALS: To manage my neck pain  NEXT MD VISIT: TBD  OBJECTIVE:  Note: Objective measures were completed at Evaluation unless otherwise noted.  DIAGNOSTIC FINDINGS:  IMPRESSION: 1. No evidence of active Neurosarcoidosis in the cervical spine. 2. 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 3. 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 No spinal cord signal abnormality. 4. And widespread associated moderate and severe cervical neural foraminal stenosis.   Electronically signed by: Helayne Hurst MD 08/10/2024 07:30 AM EDT RP Workstation: [id number]  PATIENT SURVEYS:  NDI: 12/50  Minimum Detectable Change (90% confidence): 5 points or 10% points  POSTURE: rounded shoulders and forward head  PALPATION: TTP through cervical musculature especially B scalene groups.   CERVICAL ROM:   Active ROM A/PROM (deg) eval  Flexion 75%  Extension 10%  Right lateral flexion 75%  Left lateral flexion 75%  Right rotation 50%  Left rotation 75%   (Blank rows = not tested)  UPPER EXTREMITY ROM: WFL  Active ROM Right eval Left eval  Shoulder flexion    Shoulder extension    Shoulder abduction    Shoulder adduction    Shoulder extension    Shoulder internal rotation    Shoulder external rotation  Elbow flexion    Elbow extension    Wrist flexion    Wrist extension    Wrist ulnar deviation    Wrist radial deviation    Wrist pronation    Wrist supination     (Blank rows = not tested)  UPPER EXTREMITY MMT: [facility]  MMT Right eval Left eval  Shoulder flexion    Shoulder extension    Shoulder abduction    Shoulder adduction    Shoulder extension    Shoulder internal rotation    Shoulder external rotation    Middle trapezius    Lower trapezius    Elbow flexion    Elbow extension    Wrist flexion    Wrist extension    Wrist ulnar deviation    Wrist radial deviation    Wrist pronation    Wrist supination    Grip strength     (Blank rows = not tested)  CERVICAL SPECIAL TESTS:  Neck flexor muscle endurance test: Negative and Spurling's test: Negative  FUNCTIONAL TESTS:  N/A  TREATMENT: [facility] Adult PT Treatment:                                                 DATE: 10/03/24 Therapeutic Exercise: Nustep L6 x 8 mins Seated ITY 1# 2x10 ea Manual Therapy: B scalene stretch 30s x2 BIL B levator stretch 30s x2 BIL B UT Stretch 30s x2 BIL Therapeutic Activity: Seated ER RTB 2x15 Seated hor abd RTB 2x15 Supine OH flexion 1# 2x10 with inspiration  [facility] Adult PT Treatment:                                                DATE: 09/25/24 Therapeutic Exercise: Nustep L4 8 min Manual Therapy: B scalene stretch 30s x3 B B levator stretch 30s x2 R only B UT Stretch 30s x2 B Therapeutic Activity: ER RTB 15x Seated hor abd RTB 15x Supine hor abd RTB 15x B, 15/15 Supine OH flexion 1# 15/15 Supine chin tuck over 1/2 roll 3x10  [facility] Adult PT Treatment:                                                DATE: 09/21/24 See assessment  PATIENT EDUCATION:  Education details: Discussed eval findings, rehab rationale and POC and patient is in agreement  Person educated: Patient Education method: Explanation and Handouts Education comprehension: verbalized understanding and needs further education  HOME EXERCISE PROGRAM: Access Code: [email or web address] URL: [email or web address] Date: 09/07/2024 Prepared by: Reyes Kohut  Exercises - Seated Thoracic Lumbar Extension with Pectoralis Stretch  - 1-2 x daily - 5 x weekly - 1-2 sets - 10 reps - Seated Upper Trapezius Stretch  - 1-2 x daily - 5 x weekly - 1 sets - 2 reps - 30s hold - Shoulder External Rotation and Scapular Retraction with Resistance  - 1-2 x daily - 5 x weekly - 1 sets - 10-15 reps - Standing Shoulder Horizontal Abduction with Resistance  - 1-2 x daily - 5 x weekly - 1 sets - 10-15 reps  ASSESSMENT:  CLINICAL  IMPRESSION:   Patient presents to PT reporting that she is having less pain and discomfort, and felt good after last session. Today's session focused on stretching of the cervical region to increase mobility and shoulder  strengthening. Patient was  able to tolerate all exercises and manual stretches with no adverse effect. Patient will benefit from skilled PT in order to increase functional mobility and decrease discomfort.  EVAL: Patient is a 73 y.o. female who was seen today for physical therapy evaluation and treatment for neck pain.  Patient denies radicular symptoms or UE weakness.  Symptoms have markedly improved since onset.  She presents with a forward head and rounded shoulder posture, BUE AROM and strength WFL.  Palpation finds TTP through cervical musculature, especially B scalene groups.  Patient is a good candidate for OPPT with goal of minimizing symptoms and increasing cervical mobility.  OBJECTIVE IMPAIRMENTS: decreased activity tolerance, decreased knowledge of condition, decreased mobility, decreased ROM, increased fascial restrictions, impaired flexibility, improper body mechanics, postural dysfunction, and pain.   ACTIVITY LIMITATIONS: carrying, lifting, sleeping, bed mobility, and reading  PERSONAL FACTORS: Age, Behavior pattern, Past/current experiences, Time since onset of injury/illness/exacerbation, and 1 comorbidity: neurosarcoidosis are also affecting patient's functional outcome.   REHAB POTENTIAL: Good  CLINICAL DECISION MAKING: Stable/uncomplicated  EVALUATION COMPLEXITY: Moderate   GOALS: Goals reviewed with patient? No  SHORT TERM GOALS: Target date: 09/28/2024    Patient to demonstrate independence in HEP  Baseline: [id number] Goal status: INITIAL    LONG TERM GOALS: Target date: 11/02/2024    Patient will score at least 7/50 on NDI to signify clinically meaningful improvement in functional abilities.   Baseline: 12/50 Goal status: INITIAL  2.  Patient will acknowledge minimal pain at least once during episode of care   Baseline: moderate with reading tasks Goal status: INITIAL  3.  Increase AROM cervical spine to 75% Baseline:  Active ROM A/PROM (deg) eval  Flexion 75%  Extension 10%   Right lateral flexion 75%  Left lateral flexion 75%  Right rotation 50%  Left rotation 75%   Goal status: INITIAL  4.  Patient will demonstrate appropriate posture when cued. Baseline: Forward head and rounded shoulders Goal status: INITIAL     PLAN:  PT FREQUENCY: 1-2x/week  PT DURATION: 4 weeks  PLANNED INTERVENTIONS: 97110-Therapeutic exercises, 97530- Therapeutic activity, 97112- Neuromuscular re-education, 97535- Self Care, [id number]- Manual therapy, 20560 (1-2 muscles), 20561 (3+ muscles)- Dry Needling, and Patient/Family education  PLAN FOR NEXT SESSION: HEP review and update, manual techniques as appropriate, aerobic tasks, ROM and flexibility activities, strengthening and PREs, TPDN, gait and balance training,aquatic therapy, modalities for pain and NMRE      Shanda Code, SPTA 10/03/2024, 10:41 AM

## 2024-10-03 ENCOUNTER — Ambulatory Visit: Attending: Family Medicine

## 2024-10-03 DIAGNOSIS — M542 Cervicalgia: Secondary | ICD-10-CM | POA: Insufficient documentation

## 2024-10-03 DIAGNOSIS — R293 Abnormal posture: Secondary | ICD-10-CM | POA: Diagnosis present

## 2024-10-03 DIAGNOSIS — M6281 Muscle weakness (generalized): Secondary | ICD-10-CM | POA: Insufficient documentation

## 2024-10-04 ENCOUNTER — Encounter (INDEPENDENT_AMBULATORY_CARE_PROVIDER_SITE_OTHER): Payer: Self-pay | Admitting: Ophthalmology

## 2024-10-04 ENCOUNTER — Ambulatory Visit (INDEPENDENT_AMBULATORY_CARE_PROVIDER_SITE_OTHER): Admitting: Ophthalmology

## 2024-10-04 DIAGNOSIS — Z7984 Long term (current) use of oral hypoglycemic drugs: Secondary | ICD-10-CM

## 2024-10-04 DIAGNOSIS — H5347 Heteronymous bilateral field defects: Secondary | ICD-10-CM | POA: Diagnosis not present

## 2024-10-04 DIAGNOSIS — I10 Essential (primary) hypertension: Secondary | ICD-10-CM | POA: Diagnosis not present

## 2024-10-04 DIAGNOSIS — H02834 Dermatochalasis of left upper eyelid: Secondary | ICD-10-CM | POA: Diagnosis not present

## 2024-10-04 DIAGNOSIS — H353133 Nonexudative age-related macular degeneration, bilateral, advanced atrophic without subfoveal involvement: Secondary | ICD-10-CM

## 2024-10-04 DIAGNOSIS — H35033 Hypertensive retinopathy, bilateral: Secondary | ICD-10-CM

## 2024-10-04 DIAGNOSIS — E119 Type 2 diabetes mellitus without complications: Secondary | ICD-10-CM | POA: Diagnosis not present

## 2024-10-04 DIAGNOSIS — H1045 Other chronic allergic conjunctivitis: Secondary | ICD-10-CM | POA: Diagnosis not present

## 2024-10-04 DIAGNOSIS — H02831 Dermatochalasis of right upper eyelid: Secondary | ICD-10-CM | POA: Diagnosis not present

## 2024-10-04 DIAGNOSIS — Z961 Presence of intraocular lens: Secondary | ICD-10-CM

## 2024-10-04 DIAGNOSIS — H26491 Other secondary cataract, right eye: Secondary | ICD-10-CM | POA: Diagnosis not present

## 2024-10-04 DIAGNOSIS — H04123 Dry eye syndrome of bilateral lacrimal glands: Secondary | ICD-10-CM

## 2024-10-04 DIAGNOSIS — H353132 Nonexudative age-related macular degeneration, bilateral, intermediate dry stage: Secondary | ICD-10-CM | POA: Diagnosis not present

## 2024-10-05 ENCOUNTER — Encounter (INDEPENDENT_AMBULATORY_CARE_PROVIDER_SITE_OTHER): Payer: Self-pay | Admitting: Ophthalmology

## 2024-10-05 NOTE — Therapy (Signed)
 OUTPATIENT PHYSICAL THERAPY TREATMENT NOTE   Patient Name: Tara [name] MRN: [id number] DOB:[birth year], 73 y.o., female Today's Date: 10/06/2024  END OF SESSION:  PT End of Session - 10/06/24 1041     Visit Number 7    Number of Visits 8    Date for Recertification  11/07/24    Authorization Type MCR    PT Start Time 1045    PT Stop Time 1125    PT Time Calculation (min) 40 min    Activity Tolerance Patient tolerated treatment well    Behavior During Therapy [facility] for tasks assessed/performed           Past Medical History:  Diagnosis Date   Hypertension    Sarcoidosis    Stroke [facility])    Past Surgical History:  Procedure Laterality Date   APPLICATION OF CRANIAL NAVIGATION N/A 01/04/2018   Procedure: APPLICATION OF CRANIAL NAVIGATION;  Surgeon: Unice Pac, MD;  Location: [facility] OR;  Service: Neurosurgery;  Laterality: N/A;   ATRIAL FIBRILLATION ABLATION N/A 04/27/2024   Procedure: ATRIAL FIBRILLATION ABLATION;  Surgeon: Inocencio Soyla Lunger, MD;  Location: [facility] INVASIVE CV LAB;  Service: Cardiovascular;  Laterality: N/A;   BRAIN SURGERY     COLONOSCOPY WITH PROPOFOL  N/A 11/20/2021   Procedure: COLONOSCOPY WITH PROPOFOL ;  Surgeon: Mansouraty, Aloha Raddle., MD;  Location: [facility] ENDOSCOPY;  Service: Gastroenterology;  Laterality: N/A;   HEMOSTASIS CLIP PLACEMENT  11/20/2021   Procedure: HEMOSTASIS CLIP PLACEMENT;  Surgeon: Wilhelmenia Aloha Raddle., MD;  Location: [facility] ENDOSCOPY;  Service: Gastroenterology;;   POLYPECTOMY  11/20/2021   Procedure: POLYPECTOMY;  Surgeon: Wilhelmenia Aloha Raddle., MD;  Location: [facility] ENDOSCOPY;  Service: Gastroenterology;;   PR DURAL GRAFT SPINAL Left 01/04/2018   Procedure: Left Pterional craniotomy for biopsy with Brainlab;  Surgeon: Unice Pac, MD;  Location: [facility] OR;  Service: Neurosurgery;  Laterality: Left;  Left Pterional craniotomy for biopsy with Brainlab   SCLEROTHERAPY  11/20/2021   Procedure: SCLEROTHERAPY;  Surgeon: Mansouraty, Aloha Raddle., MD;   Location: [facility] ENDOSCOPY;  Service: Gastroenterology;;   Patient Active Problem List   Diagnosis Date Noted   Status post ablation of atrial fibrillation 07/05/2024   Right leg pain 12/22/2022   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 (HCC) 11/24/2022   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 Having some soreness around the ribs but states that it is tolerable.  Hand dominance: Right  PERTINENT HISTORY:  HPI: Tara [name] is a 73 y.o. female presenting on 08/21/2024 for Hospitalization Follow-up (ER follow up - 08/09/24 at [facility] for headache and neck pain- is getting some better. Pain scale now is at 6 of 10. /This was following a ov to [place] for the neck pain the day prior. )    Discussed the use of AI scribe software for clinical note transcription with the patient, who gave verbal consent to proceed.   She experiences neck pain radiating down her neck, rated as six out of ten in intensity. The pain has persisted despite previous treatments and was initially severe enough to prevent sleep for four days.   She was previously seen in the emergency room and prescribed oxycodone , which she took for three days to aid sleep. She is uncomfortable with taking narcotics but acknowledges the necessity for sleep. She also tried tizanidine , but it did not provide relief, and she did not feel there were any spasms.   The pain impacts her daily activities, including driving, as she is unable to turn her head fully. She describes herself as an active person who enjoys dancing and finds the pain limiting her ability to engage in such activities. She recounts an incident where she had to drive home from the beach and found it difficult due to the pain.   The pain began after a day of normal activities, including exercising and cleaning, and progressively worsened throughout the day. No recent changes in her sleeping arrangements or pillows, which she has used consistently for over a year.     PAIN:  Are you having pain? Yes: NPRS scale: unable to rate Pain location: R>L neck pain Pain description: ache Aggravating factors: turning Relieving factors: rest  PRECAUTIONS: None  RED FLAGS: None     WEIGHT BEARING RESTRICTIONS: No  FALLS:  Has patient fallen in last 6 months? No  OCCUPATION: retired  PLOF: Independent  PATIENT GOALS: To manage my neck pain  NEXT MD VISIT: TBD  OBJECTIVE:  Note: Objective measures were completed at Evaluation unless otherwise noted.  DIAGNOSTIC FINDINGS:  IMPRESSION: 1. No evidence of active Neurosarcoidosis in the cervical spine. 2. 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 3. 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 No spinal cord signal abnormality. 4. And widespread associated moderate and severe cervical neural foraminal stenosis.   Electronically signed by: Helayne Hurst MD 08/10/2024 07:30 AM EDT RP Workstation: [id number]  PATIENT SURVEYS:  NDI: 12/50  Minimum Detectable Change (90% confidence): 5 points or 10% points  POSTURE: rounded shoulders and forward head  PALPATION: TTP through cervical musculature especially B scalene groups.   CERVICAL ROM:   Active ROM A/PROM (deg) eval 10/06/24  Flexion 75% 75%  Extension 10% 75%  Right lateral flexion 75% 75%  Left lateral flexion 75% 75%  Right rotation 50% 90%  Left rotation 75% 90%   (Blank rows = not tested)  UPPER EXTREMITY ROM: WFL  Active ROM Right eval Left eval  Shoulder flexion    Shoulder extension    Shoulder abduction    Shoulder adduction    Shoulder extension    Shoulder internal rotation  Shoulder external rotation    Elbow flexion    Elbow extension    Wrist flexion    Wrist extension    Wrist ulnar deviation    Wrist radial deviation    Wrist pronation    Wrist supination     (Blank rows = not tested)  UPPER EXTREMITY MMT: [facility]  MMT Right eval Left eval  Shoulder flexion    Shoulder extension    Shoulder abduction    Shoulder adduction    Shoulder extension    Shoulder internal rotation    Shoulder external rotation    Middle trapezius    Lower trapezius    Elbow flexion    Elbow extension    Wrist flexion    Wrist extension    Wrist ulnar deviation    Wrist radial deviation    Wrist pronation    Wrist supination    Grip strength     (Blank rows = not tested)  CERVICAL SPECIAL TESTS:  Neck flexor muscle endurance test: Negative and Spurling's test: Negative  FUNCTIONAL TESTS:   N/A  TREATMENT: [facility] Adult PT Treatment:                                                DATE: 10/06/24 Therapeutic Exercise: Nustep L x 8 mins Manual Therapy: B scalene stretch 30s x3 BIL B levator stretch 30s x2 BIL B UT Stretch 30s x2 BIL  Therapeutic Activity: Seated ER RTB 2x15 (pronated and supinated Seated hor abd RTB 2x15 Supine OH flexion 2# 15/15 Supine hor abd RTB 15x B, 15/15  [facility] Adult PT Treatment:                                                DATE: 10/03/24 Therapeutic Exercise: Nustep L6 x 8 mins Seated ITY 1# 2x10 ea Manual Therapy: B scalene stretch 30s x2 BIL B levator stretch 30s x2 BIL B UT Stretch 30s x2 BIL Therapeutic Activity: Seated ER RTB 2x15 Seated hor abd RTB 2x15 Supine OH flexion 1# 2x10 with inspiration  [facility] Adult PT Treatment:                                                DATE: 09/25/24 Therapeutic Exercise: Nustep L4 8 min Manual Therapy: B scalene stretch 30s x3 B B levator stretch 30s x2 R only B UT Stretch 30s x2 B Therapeutic Activity: ER RTB 15x Seated hor abd RTB 15x Supine hor abd RTB 15x B, 15/15 Supine OH flexion 1# 15/15 Supine chin tuck over 1/2 roll 3x10  [facility] Adult PT Treatment:                                                DATE: 09/21/24 See assessment  PATIENT EDUCATION:  Education details: Discussed eval findings, rehab rationale and POC and patient is in agreement  Person educated: Patient Education method: Explanation and Handouts Education comprehension: verbalized understanding and needs further  education  HOME EXERCISE PROGRAM: Access Code: [email or web address] URL: [email or web address] Date: 09/07/2024 Prepared by: Reyes Kohut  Exercises - Seated Thoracic Lumbar Extension with Pectoralis Stretch  - 1-2 x daily - 5 x weekly - 1-2 sets - 10 reps - Seated Upper Trapezius Stretch  - 1-2 x daily - 5 x weekly - 1 sets - 2 reps - 30s hold - Shoulder External Rotation and Scapular Retraction with  Resistance  - 1-2 x daily - 5 x weekly - 1 sets - 10-15 reps - Standing Shoulder Horizontal Abduction with Resistance  - 1-2 x daily - 5 x weekly - 1 sets - 10-15 reps  ASSESSMENT:  CLINICAL IMPRESSION:  Symptoms markedly improved, AROM re-assessed and found to have greatly improved.  Continued to review/perform postural retraining with focus on maintaining chin tucked position.  Discussed MFR and potential DC at next session.  EVAL: Patient is a 73 y.o. female who was seen today for physical therapy evaluation and treatment for neck pain.  Patient denies radicular symptoms or UE weakness.  Symptoms have markedly improved since onset.  She presents with a forward head and rounded shoulder posture, BUE AROM and strength WFL.  Palpation finds TTP through cervical musculature, especially B scalene groups.  Patient is a good candidate for OPPT with goal of minimizing symptoms and increasing cervical mobility.  OBJECTIVE IMPAIRMENTS: decreased activity tolerance, decreased knowledge of condition, decreased mobility, decreased ROM, increased fascial restrictions, impaired flexibility, improper body mechanics, postural dysfunction, and pain.   ACTIVITY LIMITATIONS: carrying, lifting, sleeping, bed mobility, and reading  PERSONAL FACTORS: Age, Behavior pattern, Past/current experiences, Time since onset of injury/illness/exacerbation, and 1 comorbidity: neurosarcoidosis are also affecting patient's functional outcome.   REHAB POTENTIAL: Good  CLINICAL DECISION MAKING: Stable/uncomplicated  EVALUATION COMPLEXITY: Moderate   GOALS: Goals reviewed with patient? No  SHORT TERM GOALS: Target date: 09/28/2024    Patient to demonstrate independence in HEP  Baseline: [id number] Goal status: Met    LONG TERM GOALS: Target date: 11/02/2024    Patient will score at least 7/50 on NDI to signify clinically meaningful improvement in functional abilities.   Baseline: 12/50 Goal status: INITIAL  2.   Patient will acknowledge minimal pain at least once during episode of care   Baseline: moderate with reading tasks; 10/06/24 improved to minimal Goal status: Met  3.  Increase AROM cervical spine to 75% Baseline:  Active ROM A/PROM (deg) eval 10/06/24  Flexion 75% 75%  Extension 10% 75%  Right lateral flexion 75% 75%  Left lateral flexion 75% 75%  Right rotation 50% 90%  Left rotation 75% 90%   Goal status: Met  4.  Patient will demonstrate appropriate posture when cued. Baseline: Forward head and rounded shoulders; 10/06/24 patient aware of good postural position but myofascial restrictions limit ability to attain neutral  Goal status: Ongoing     PLAN:  PT FREQUENCY: 1-2x/week  PT DURATION: 4 weeks  PLANNED INTERVENTIONS: 97110-Therapeutic exercises, 97530- Therapeutic activity, 97112- Neuromuscular re-education, 97535- Self Care, [id number]- Manual therapy, 20560 (1-2 muscles), 20561 (3+ muscles)- Dry Needling, and Patient/Family education  PLAN FOR NEXT SESSION: HEP review and update, manual techniques as appropriate, aerobic tasks, ROM and flexibility activities, strengthening and PREs, TPDN, gait and balance training,aquatic therapy, modalities for pain and NMRE      Reyes CHRISTELLA Kohut, PT 10/06/2024, 11:41 AM

## 2024-10-06 ENCOUNTER — Ambulatory Visit

## 2024-10-06 DIAGNOSIS — R293 Abnormal posture: Secondary | ICD-10-CM

## 2024-10-06 DIAGNOSIS — M542 Cervicalgia: Secondary | ICD-10-CM | POA: Diagnosis not present

## 2024-10-06 DIAGNOSIS — M6281 Muscle weakness (generalized): Secondary | ICD-10-CM

## 2024-10-10 ENCOUNTER — Other Ambulatory Visit: Payer: Self-pay | Admitting: Internal Medicine

## 2024-10-10 DIAGNOSIS — Z6833 Body mass index (BMI) 33.0-33.9, adult: Secondary | ICD-10-CM

## 2024-10-10 DIAGNOSIS — R7303 Prediabetes: Secondary | ICD-10-CM

## 2024-10-19 ENCOUNTER — Ambulatory Visit

## 2024-10-19 VITALS — BP 110/80 | HR 71 | Ht 64.5 in | Wt 197.6 lb

## 2024-10-19 DIAGNOSIS — Z0000 Encounter for general adult medical examination without abnormal findings: Secondary | ICD-10-CM

## 2024-10-19 NOTE — Progress Notes (Signed)
 Chief Complaint  Patient presents with   Medicare Wellness     Subjective:   Tara [name] is a 73 y.o. female who presents for a Medicare Annual Wellness Visit.  Visit info / Clinical Intake: Medicare Wellness Visit Type:: Subsequent Annual Wellness Visit Persons participating in visit and providing information:: patient Medicare Wellness Visit Mode:: In-person (required for WTM) Interpreter Needed?: No Pre-visit prep was completed: yes AWV questionnaire completed by patient prior to visit?: no Living arrangements:: lives with spouse/significant other Patient's Overall Health Status Rating: very good Typical amount of pain: none Does pain affect daily life?: no Are you currently prescribed opioids?: no  Dietary Habits and Nutritional Risks How many meals a day?: 3 Eats fruit and vegetables daily?: yes Most meals are obtained by: preparing own meals In the last 2 weeks, have you had any of the following?: none Diabetic:: no  Functional Status Activities of Daily Living (to include ambulation/medication): Independent Ambulation: Independent Medication Administration: Independent Home Management (perform basic housework or laundry): Independent Manage your own finances?: yes Primary transportation is: driving Concerns about vision?: no *vision screening is required for WTM* Concerns about hearing?: no  Fall Screening Falls in the past year?: 1 Number of falls in past year: 0 Was there an injury with Fall?: 0 Fall Risk Category Calculator: 1 Patient Fall Risk Level: Low Fall Risk  Fall Risk Patient at Risk for Falls Due to: No Fall Risks Fall risk Follow up: Falls evaluation completed; Falls prevention discussed  Home and Transportation Safety: All rugs have non-skid backing?: N/A, no rugs All stairs or steps have railings?: yes (4 story house) Grab bars in the bathtub or shower?: yes Have non-skid surface in bathtub or shower?: yes Good home lighting?:  yes Regular seat belt use?: yes Hospital stays in the last year:: no  Cognitive Assessment Difficulty concentrating, remembering, or making decisions? : yes (due to condition/Brain mass) Will 6CIT or Mini Cog be Completed: yes What year is it?: 0 points What month is it?: 0 points Give patient an address phrase to remember (5 components): [address], [place] About what time is it?: 0 points Count backwards from 20 to 1: 0 points Say the months of the year in reverse: 0 points Repeat the address phrase from earlier: 0 points 6 CIT Score: 0 points  Advance Directives (For Healthcare) Does Patient Have a Medical Advance Directive?: Yes Does patient want to make changes to medical advance directive?: No - Patient declined Type of Advance Directive: Healthcare Power of [place]; Living will; Out of facility DNR (pink MOST or yellow form) Copy of Healthcare Power of Attorney in Chart?: No - copy requested Copy of Living Will in Chart?: No - copy requested Out of facility DNR (pink MOST or yellow form) in Chart? (Ambulatory ONLY): No - copy requested  Reviewed/Updated  Reviewed/Updated: Reviewed All (Medical, Surgical, Family, Medications, Allergies, Care Teams, Patient Goals)    Allergies (verified) Ace inhibitors and Ether   Current Medications (verified) Outpatient Encounter Medications as of 10/19/2024  Medication Sig   alendronate  (FOSAMAX ) 70 MG tablet TAKE 1 TABLET BY MOUTH ONCE WEEKLY WITH A FULL GLASS OF WATER ON AN EMPTY STOMACH   aspirin  EC 81 MG tablet Take 1 tablet (81 mg total) by mouth daily.   diltiazem  (CARDIZEM  CD) 180 MG 24 hr capsule Take 2 capsules (360 mg total) by mouth daily.   ELIQUIS  5 MG TABS tablet TAKE 1 TABLET BY MOUTH 2 TIMES A DAY  hydrochlorothiazide  (HYDRODIURIL ) 25 MG tablet TAKE 1 TABLET BY MOUTH DAILY   metFORMIN  (GLUCOPHAGE ) 500 MG tablet TAKE 1 TABLET BY MOUTH DAILY WITH BREAKFAST   metoprolol  tartrate (LOPRESSOR ) 25 MG tablet TAKE 1  TABLET BY MOUTH AS NEEDED FOR HEART RATE GREATER THEN 100)   Multiple Vitamin (MULTIVITAMIN WITH MINERALS) TABS tablet Take 1 tablet by mouth daily.   Multiple Vitamins-Minerals (PRESERVISION AREDS PO) Take 1 capsule by mouth in the morning and at bedtime. Unknown strenght   rosuvastatin  (CRESTOR ) 10 MG tablet TAKE 1 TABLET BY MOUTH DAILY   valsartan  (DIOVAN ) 40 MG tablet TAKE 1 TABLET BY MOUTH DAILY   oxyCODONE  (ROXICODONE ) 5 MG immediate release tablet Take 1 tablet (5 mg total) by mouth every 4 (four) hours as needed for severe pain (pain score 7-10).   tiZANidine  (ZANAFLEX ) 4 MG tablet Take 1 tablet (4 mg total) by mouth every 6 (six) hours as needed for muscle spasms. (Patient not taking: Reported on 08/21/2024)   No facility-administered encounter medications on file as of 10/19/2024.    History: Past Medical History:  Diagnosis Date   Hypertension    Sarcoidosis    Stroke [facility])    Past Surgical History:  Procedure Laterality Date   APPLICATION OF CRANIAL NAVIGATION N/A 01/04/2018   Procedure: APPLICATION OF CRANIAL NAVIGATION;  Surgeon: Unice Pac, MD;  Location: [facility] OR;  Service: Neurosurgery;  Laterality: N/A;   ATRIAL FIBRILLATION ABLATION N/A 04/27/2024   Procedure: ATRIAL FIBRILLATION ABLATION;  Surgeon: Inocencio Soyla Lunger, MD;  Location: [facility] INVASIVE CV LAB;  Service: Cardiovascular;  Laterality: N/A;   BRAIN SURGERY     COLONOSCOPY WITH PROPOFOL  N/A 11/20/2021   Procedure: COLONOSCOPY WITH PROPOFOL ;  Surgeon: Mansouraty, Aloha Raddle., MD;  Location: [facility] ENDOSCOPY;  Service: Gastroenterology;  Laterality: N/A;   HEMOSTASIS CLIP PLACEMENT  11/20/2021   Procedure: HEMOSTASIS CLIP PLACEMENT;  Surgeon: Wilhelmenia Aloha Raddle., MD;  Location: [facility] ENDOSCOPY;  Service: Gastroenterology;;   POLYPECTOMY  11/20/2021   Procedure: POLYPECTOMY;  Surgeon: Wilhelmenia Aloha Raddle., MD;  Location: [facility] ENDOSCOPY;  Service: Gastroenterology;;   PR DURAL GRAFT SPINAL Left 01/04/2018   Procedure: Left  Pterional craniotomy for biopsy with Brainlab;  Surgeon: Unice Pac, MD;  Location: [facility] OR;  Service: Neurosurgery;  Laterality: Left;  Left Pterional craniotomy for biopsy with Brainlab   SCLEROTHERAPY  11/20/2021   Procedure: SCLEROTHERAPY;  Surgeon: Mansouraty, Aloha Raddle., MD;  Location: [facility] ENDOSCOPY;  Service: Gastroenterology;;   Family History  Problem Relation Age of Onset   Colon cancer Mother    Asthma Mother        Had as a child   Heart disease Mother    Heart failure Mother    Colon cancer Father    CAD Father    Cancer Father    Diabetes Father    Heart disease Father    Hypertension Father    Diabetes Sister    Macular degeneration Sister    Diabetes Brother    Social History   Occupational History   Occupation: Retired  Tobacco Use   Smoking status: Former    Current packs/day: 0.00    Average packs/day: 1.0 packs/day    Types: Cigarettes    Quit date: 12/31/2017    Years since quitting: 6.8   Smokeless tobacco: Never   Tobacco comments:    Former smoker 05/25/24  Vaping Use   Vaping status: Never Used  Substance and Sexual Activity   Alcohol use: No   Drug use: No  Sexual activity: Yes   Tobacco Counseling Counseling given: Not Answered Tobacco comments: Former smoker 05/25/24  SDOH Screenings   Food Insecurity: No Food Insecurity (10/19/2024)  Housing: Unknown (10/19/2024)  Transportation Needs: No Transportation Needs (10/19/2024)  Utilities: Not At Risk (10/19/2024)  Alcohol Screen: Low Risk (08/06/2023)  Depression (PHQ2-9): Low Risk (10/19/2024)  Financial Resource Strain: Low Risk (08/03/2022)  Physical Activity: Sufficiently Active (10/19/2024)  Social Connections: Moderately Isolated (10/19/2024)  Stress: Stress Concern Present (10/19/2024)  Tobacco Use: Medium Risk (10/06/2024)  Health Literacy: Adequate Health Literacy (10/19/2024)   See flowsheets for full screening details  Depression Screen PHQ 2 & 9 Depression Scale- Over the  past 2 weeks, how often have you been bothered by any of the following problems? Little interest or pleasure in doing things: 0 Feeling down, depressed, or hopeless (PHQ Adolescent also includes...irritable): 0 PHQ-2 Total Score: 0 Trouble falling or staying asleep, or sleeping too much: 0 Feeling tired or having little energy: 0 Poor appetite or overeating (PHQ Adolescent also includes...weight loss): 0 Feeling bad about yourself - or that you are a failure or have let yourself or your family down: 0 Trouble concentrating on things, such as reading the newspaper or watching television (PHQ Adolescent also includes...like school work): 0 Moving or speaking so slowly that other people could have noticed. Or the opposite - being so fidgety or restless that you have been moving around a lot more than usual: 0 Thoughts that you would be better off dead, or of hurting yourself in some way: 0 PHQ-9 Total Score: 0 If you checked off any problems, how difficult have these problems made it for you to do your work, take care of things at home, or get along with other people?: Not difficult at all  Depression Treatment Depression Interventions/Treatment : EYV7-0 Score <4 Follow-up Not Indicated     Goals Addressed               This Visit's Progress     Weight (lb) < 197 lb 9.6 oz (89.6 kg) (pt-stated)   197 lb 9.6 oz (89.6 kg)     Would like to lose some weight/2025             Objective:    Today's Vitals   10/19/24 0945  BP: 110/80  Pulse: 71  SpO2: 99%  Weight: 197 lb 9.6 oz (89.6 kg)  Height: 5' 4.5 (1.638 m)   Body mass index is 33.39 kg/m.  Hearing/Vision screen Hearing Screening - Comments:: Denies hearing difficulties   Vision Screening - Comments:: UTD/Dr. GORMAN Groat/Dr. Valdemar Immunizations and Health Maintenance Health Maintenance  Topic Date Due   Diabetic kidney evaluation - Urine ACR  Never done   Zoster Vaccines- Shingrix (1 of 2) Never done   Pneumococcal  Vaccine: 50+ Years (2 of 2 - PCV) 01/05/2019   COVID-19 Vaccine (3 - Pfizer risk series) 01/11/2020   Colonoscopy  11/20/2024   Influenza Vaccine  01/30/2025 (Originally 06/02/2024)   Mammogram  02/03/2025   Diabetic kidney evaluation - eGFR measurement  08/10/2025   Medicare Annual Wellness (AWV)  10/19/2025   DTaP/Tdap/Td (3 - Td or Tdap) 12/08/2032   Bone Density Scan  Completed   Hepatitis C Screening  Completed   Meningococcal B Vaccine  Aged Out   Fecal DNA (Cologuard)  Discontinued        Assessment/Plan:  This is a routine wellness examination for Tara [name].  Patient Care Team: Rollene Almarie LABOR, MD as PCP -  General (Internal Medicine) Bernie Lamar PARAS, MD as PCP - Cardiology (Cardiology) Inocencio Soyla Lunger, MD as PCP - Electrophysiology (Cardiology) Octavia Bruckner, MD as Consulting Physician (Ophthalmology)  I have personally reviewed and noted the following in the patients chart:   Medical and social history Use of alcohol, tobacco or illicit drugs  Current medications and supplements including opioid prescriptions. Functional ability and status Nutritional status Physical activity Advanced directives List of other physicians Hospitalizations, surgeries, and ER visits in previous 12 months Vitals Screenings to include cognitive, depression, and falls Referrals and appointments  No orders of the defined types were placed in this encounter.  In addition, I have reviewed and discussed with patient certain preventive protocols, quality metrics, and best practice recommendations. A written personalized care plan for preventive services as well as general preventive health recommendations were provided to patient.   Everton Bertha L Johan Antonacci, CMA   10/19/2024   Return in 1 year (on 10/19/2025).  After Visit Summary: (MyChart) Due to this being a telephonic visit, the after visit summary with patients personalized plan was offered to patient via MyChart   Nurse  Notes: Patient is due for a flu vaccine, a shingrix vaccine and a pneumonia vaccine.  She had no other concerns to address today.

## 2024-10-19 NOTE — Patient Instructions (Addendum)
 Tara [name],  Thank you for taking the time for your Medicare Wellness Visit. I appreciate your continued commitment to your health goals. Please review the care plan we discussed, and feel free to reach out if I can assist you further.  Please note that Annual Wellness Visits do not include a physical exam. Some assessments may be limited, especially if the visit was conducted virtually. If needed, we may recommend an in-person follow-up with your provider.  Ongoing Care Seeing your primary care provider every 3 to 6 months helps us  monitor your health and provide consistent, personalized care. Last office visit on 08/21/2024.  You are due for a flu vaccine, a shingles vaccine and a pneumonia vaccine.  These can be given at your local pharmacy.  Referrals If a referral was made during today's visit and you haven't received any updates within two weeks, please contact the referred provider directly to check on the status.  Recommended Screenings:  Health Maintenance  Topic Date Due   Yearly kidney health urinalysis for diabetes  Never done   Zoster (Shingles) Vaccine (1 of 2) Never done   Pneumococcal Vaccine for age over 26 (2 of 2 - PCV) 01/05/2019   COVID-19 Vaccine (3 - Pfizer risk series) 01/11/2020   Medicare Annual Wellness Visit  08/05/2024   Colon Cancer Screening  11/20/2024   Flu Shot  01/30/2025*   Breast Cancer Screening  02/03/2025   Yearly kidney function blood test for diabetes  08/10/2025   DTaP/Tdap/Td vaccine (3 - Td or Tdap) 12/08/2032   Osteoporosis screening with Bone Density Scan  Completed   Hepatitis C Screening  Completed   Meningitis B Vaccine  Aged Out   Cologuard (Stool DNA test)  Discontinued  *Topic was postponed. The date shown is not the original due date.       10/19/2024    9:18 AM  Advanced Directives  Does Patient Have a Medical Advance Directive? Yes  Type of Estate Agent of [place];Living will;Out of facility DNR  (pink MOST or yellow form)  Copy of Healthcare Power of Attorney in Chart? No - copy requested    Vision: Annual vision screenings are recommended for early detection of glaucoma, cataracts, and diabetic retinopathy. These exams can also reveal signs of chronic conditions such as diabetes and high blood pressure.  Dental: Annual dental screenings help detect early signs of oral cancer, gum disease, and other conditions linked to overall health, including heart disease and diabetes.  Please see the attached documents for additional preventive care recommendations.

## 2024-10-25 NOTE — Therapy (Signed)
 " OUTPATIENT PHYSICAL THERAPY TREATMENT NOTE/DISCHARGE   Patient Name: Tara [name] MRN: [id number] DOB:Dec 24, 1950, 73 y.o., female Today's Date: 10/30/2024  END OF SESSION:  PT End of Session - 10/30/24 0821     Visit Number 8    Number of Visits 8    Date for Recertification  11/07/24    Authorization Type MCR    PT Start Time 0830    PT Stop Time 0910    PT Time Calculation (min) 40 min    Activity Tolerance Patient tolerated treatment well    Behavior During Therapy [facility] for tasks assessed/performed            Past Medical History:  Diagnosis Date   Hypertension    Sarcoidosis    Stroke [facility])    Past Surgical History:  Procedure Laterality Date   APPLICATION OF CRANIAL NAVIGATION N/A 01/04/2018   Procedure: APPLICATION OF CRANIAL NAVIGATION;  Surgeon: Unice Pac, MD;  Location: [facility] OR;  Service: Neurosurgery;  Laterality: N/A;   ATRIAL FIBRILLATION ABLATION N/A 04/27/2024   Procedure: ATRIAL FIBRILLATION ABLATION;  Surgeon: Inocencio Soyla Lunger, MD;  Location: [facility] INVASIVE CV LAB;  Service: Cardiovascular;  Laterality: N/A;   BRAIN SURGERY     COLONOSCOPY WITH PROPOFOL  N/A 11/20/2021   Procedure: COLONOSCOPY WITH PROPOFOL ;  Surgeon: Mansouraty, Aloha Raddle., MD;  Location: [facility] ENDOSCOPY;  Service: Gastroenterology;  Laterality: N/A;   HEMOSTASIS CLIP PLACEMENT  11/20/2021   Procedure: HEMOSTASIS CLIP PLACEMENT;  Surgeon: Wilhelmenia Aloha Raddle., MD;  Location: [facility] ENDOSCOPY;  Service: Gastroenterology;;   POLYPECTOMY  11/20/2021   Procedure: POLYPECTOMY;  Surgeon: Wilhelmenia Aloha Raddle., MD;  Location: [facility] ENDOSCOPY;  Service: Gastroenterology;;   PR DURAL GRAFT SPINAL Left 01/04/2018   Procedure: Left Pterional craniotomy for biopsy with Brainlab;  Surgeon: Unice Pac, MD;  Location: [facility] OR;  Service: Neurosurgery;  Laterality: Left;  Left Pterional craniotomy for biopsy with Brainlab   SCLEROTHERAPY  11/20/2021   Procedure: SCLEROTHERAPY;  Surgeon: Mansouraty, Aloha Raddle., MD;  Location: [facility] ENDOSCOPY;  Service: Gastroenterology;;   Patient Active Problem List   Diagnosis Date Noted   Status post ablation of atrial fibrillation 07/05/2024   Right leg pain 12/22/2022   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 (HCC) 11/24/2022   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  Dealing with stress as husband fractured his hip.  Cannot recall the last time her neck bothered her.   Hand dominance: Right  PERTINENT HISTORY:  HPI: Tara [name] is a 73 y.o. female presenting on 08/21/2024 for Hospitalization Follow-up (ER follow up - 08/09/24 at [facility] for headache and neck pain- is getting some better. Pain scale now is at 6 of 10. /This was following a ov to  [place] for the neck pain the day prior. )   Discussed the use of AI scribe software for clinical note transcription with the patient, who gave verbal consent to proceed.   She experiences neck pain radiating down her neck, rated as six out of ten in intensity. The pain has persisted despite previous treatments and was initially severe enough to prevent sleep for four days.   She was previously seen in the emergency room and prescribed oxycodone , which she took for three days to aid sleep. She is uncomfortable with taking narcotics but acknowledges the necessity for sleep. She also tried tizanidine , but it did not provide relief, and she did not feel there were any spasms.   The pain impacts her daily activities, including driving, as she is unable to turn her head fully. She describes herself as an active person who enjoys dancing and finds the pain limiting her ability to engage in such activities. She recounts an incident where she had to drive home from the beach and found it difficult due to the pain.   The pain began after a day of normal activities, including exercising and cleaning, and progressively worsened throughout the day. No recent changes in her sleeping arrangements or pillows, which she has used consistently for over a year.     PAIN:  Are you having pain? Yes: NPRS scale: unable to rate Pain location: R>L neck pain Pain description: ache Aggravating factors: turning Relieving factors: rest  PRECAUTIONS: None  RED FLAGS: None     WEIGHT BEARING RESTRICTIONS: No  FALLS:  Has patient fallen in last 6 months? No  OCCUPATION: retired  PLOF: Independent  PATIENT GOALS: To manage my neck pain  NEXT MD VISIT: TBD  OBJECTIVE:  Note: Objective measures were completed at Evaluation unless otherwise noted.  DIAGNOSTIC FINDINGS:  IMPRESSION: 1. No evidence of active Neurosarcoidosis in the cervical spine. 2. Ordinary but advanced cervical spine degeneration  including evidence of acute exacerbation of chronic facet arthritis on the right at C2-C3. 3. 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 No spinal cord signal abnormality. 4. And widespread associated moderate and severe cervical neural foraminal stenosis.   Electronically signed by: Helayne Hurst MD 08/10/2024 07:30 AM EDT RP Workstation: [id number]  PATIENT SURVEYS:  NDI: 12/50; 10/30/24 0/50  Minimum Detectable Change (90% confidence): 5 points or 10% points  POSTURE: rounded shoulders and forward head  PALPATION: TTP through cervical musculature especially B scalene groups.   CERVICAL ROM:   Active ROM A/PROM (deg) eval 10/06/24  Flexion 75% 75%  Extension 10% 75%  Right lateral flexion 75% 75%  Left lateral flexion 75% 75%  Right rotation 50% 90%  Left rotation 75% 90%   (Blank rows = not tested)  UPPER EXTREMITY ROM: WFL  Active ROM Right eval Left eval  Shoulder flexion    Shoulder extension    Shoulder abduction    Shoulder adduction  Shoulder extension    Shoulder internal rotation    Shoulder external rotation    Elbow flexion    Elbow extension    Wrist flexion    Wrist extension    Wrist ulnar deviation    Wrist radial deviation    Wrist pronation    Wrist supination     (Blank rows = not tested)  UPPER EXTREMITY MMT: [facility]  MMT Right eval Left eval  Shoulder flexion    Shoulder extension    Shoulder abduction    Shoulder adduction    Shoulder extension    Shoulder internal rotation    Shoulder external rotation    Middle trapezius    Lower trapezius    Elbow flexion    Elbow extension    Wrist flexion    Wrist extension    Wrist ulnar deviation    Wrist radial deviation    Wrist pronation    Wrist supination    Grip strength     (Blank rows = not tested)  CERVICAL SPECIAL TESTS:  Neck flexor muscle endurance test: Negative and  Spurling's test: Negative  FUNCTIONAL TESTS:  N/A  TREATMENT: [facility] Adult PT Treatment:                                                DATE: 10/30/24 Therapeutic Exercise: Nustep L4 8 min Manual Therapy: B scalene stretch 30s x3 BIL B levator stretch 30s x2 BIL B UT Stretch 30s x2 BIL Therapeutic Activity: Seated ER RTB x15 Seated hor abd RTB x15 Supine OH flexion 1# 15/15 Supine hor abd RTB 15x  [facility] Adult PT Treatment:                                                DATE: 10/06/24 Therapeutic Exercise: Nustep L x 8 mins Manual Therapy: B scalene stretch 30s x3 BIL B levator stretch 30s x2 BIL B UT Stretch 30s x2 BIL  Therapeutic Activity: Seated ER RTB 2x15 (pronated and supinated Seated hor abd RTB 2x15 Supine OH flexion 2# 15/15 Supine hor abd RTB 15x B, 15/15  [facility] Adult PT Treatment:                                                DATE: 10/03/24 Therapeutic Exercise: Nustep L6 x 8 mins Seated ITY 1# 2x10 ea Manual Therapy: B scalene stretch 30s x2 BIL B levator stretch 30s x2 BIL B UT Stretch 30s x2 BIL Therapeutic Activity: Seated ER RTB 2x15 Seated hor abd RTB 2x15 Supine OH flexion 1# 2x10 with inspiration  [facility] Adult PT Treatment:                                                DATE: 09/25/24 Therapeutic Exercise: Nustep L4 8 min Manual Therapy: B scalene stretch 30s x3 B B levator stretch 30s x2 R only B UT Stretch 30s x2 B Therapeutic Activity: ER RTB 15x Seated hor  abd RTB 15x Supine hor abd RTB 15x B, 15/15 Supine OH flexion 1# 15/15 Supine chin tuck over 1/2 roll 3x10  [facility] Adult PT Treatment:                                                DATE: 09/21/24 See assessment  PATIENT EDUCATION:  Education details: Discussed eval findings, rehab rationale and POC and patient is in agreement  Person educated: Patient Education method: Explanation and Handouts Education comprehension: verbalized understanding and needs further education  HOME  EXERCISE PROGRAM: Access Code: [email or web address] URL: [email or web address] Date: 09/07/2024 Prepared by: Reyes Kohut  Exercises - Seated Thoracic Lumbar Extension with Pectoralis Stretch  - 1-2 x daily - 5 x weekly - 1-2 sets - 10 reps - Seated Upper Trapezius Stretch  - 1-2 x daily - 5 x weekly - 1 sets - 2 reps - 30s hold - Shoulder External Rotation and Scapular Retraction with Resistance  - 1-2 x daily - 5 x weekly - 1 sets - 10-15 reps - Standing Shoulder Horizontal Abduction with Resistance  - 1-2 x daily - 5 x weekly - 1 sets - 10-15 reps  ASSESSMENT:  CLINICAL IMPRESSION:  Rehab goals met.  Patient ready for DC.  EVAL: Patient is a 73 y.o. female who was seen today for physical therapy evaluation and treatment for neck pain.  Patient denies radicular symptoms or UE weakness.  Symptoms have markedly improved since onset.  She presents with a forward head and rounded shoulder posture, BUE AROM and strength WFL.  Palpation finds TTP through cervical musculature, especially B scalene groups.  Patient is a good candidate for OPPT with goal of minimizing symptoms and increasing cervical mobility.  OBJECTIVE IMPAIRMENTS: decreased activity tolerance, decreased knowledge of condition, decreased mobility, decreased ROM, increased fascial restrictions, impaired flexibility, improper body mechanics, postural dysfunction, and pain.   ACTIVITY LIMITATIONS: carrying, lifting, sleeping, bed mobility, and reading  PERSONAL FACTORS: Age, Behavior pattern, Past/current experiences, Time since onset of injury/illness/exacerbation, and 1 comorbidity: neurosarcoidosis are also affecting patient's functional outcome.   REHAB POTENTIAL: Good  CLINICAL DECISION MAKING: Stable/uncomplicated  EVALUATION COMPLEXITY: Moderate   GOALS: Goals reviewed with patient? No  SHORT TERM GOALS: Target date: 09/28/2024  Patient to demonstrate independence in HEP  Baseline: [id number] Goal status:  Met    LONG TERM GOALS: Target date: 11/02/2024    Patient will score at least 7/50 on NDI to signify clinically meaningful improvement in functional abilities.   Baseline: 12/50; 10/30/24 0/50 Goal status: Met  2.  Patient will acknowledge minimal pain at least once during episode of care   Baseline: moderate with reading tasks; 10/06/24 improved to minimal Goal status: Met  3.  Increase AROM cervical spine to 75% Baseline:  Active ROM A/PROM (deg) eval 10/06/24  Flexion 75% 75%  Extension 10% 75%  Right lateral flexion 75% 75%  Left lateral flexion 75% 75%  Right rotation 50% 90%  Left rotation 75% 90%   Goal status: Met  4.  Patient will demonstrate appropriate posture when cued. Baseline: Forward head and rounded shoulders; 10/06/24 patient aware of good postural position but myofascial restrictions limit ability to attain neutral.  10/30/24 Able to self correct posture and adjust reading position Goal status: Met     PLAN:  PT FREQUENCY: 1-2x/week  PT DURATION: 4 weeks  PLANNED INTERVENTIONS:  97110-Therapeutic exercises, 97530- Therapeutic activity, [id number]- Neuromuscular re-education, [phone]- Self Care, 02859- Manual therapy, 20560 (1-2 muscles), 20561 (3+ muscles)- Dry Needling, and Patient/Family education  PLAN FOR NEXT SESSION: HEP review and update, manual techniques as appropriate, aerobic tasks, ROM and flexibility activities, strengthening and PREs, TPDN, gait and balance training,aquatic therapy, modalities for pain and NMRE      Tanelle Lanzo M Bartt Gonzaga, PT 10/30/2024, 9:20 AM      "

## 2024-10-30 ENCOUNTER — Ambulatory Visit

## 2024-10-30 DIAGNOSIS — M6281 Muscle weakness (generalized): Secondary | ICD-10-CM

## 2024-10-30 DIAGNOSIS — M542 Cervicalgia: Secondary | ICD-10-CM

## 2024-10-30 DIAGNOSIS — R293 Abnormal posture: Secondary | ICD-10-CM

## 2024-11-07 ENCOUNTER — Ambulatory Visit: Payer: Self-pay | Admitting: Internal Medicine

## 2024-11-07 ENCOUNTER — Ambulatory Visit: Admitting: Internal Medicine

## 2024-11-07 ENCOUNTER — Encounter: Payer: Self-pay | Admitting: Internal Medicine

## 2024-11-07 VITALS — BP 110/70 | HR 65 | Temp 98.5°F | Ht 64.5 in | Wt 197.8 lb

## 2024-11-07 DIAGNOSIS — E782 Mixed hyperlipidemia: Secondary | ICD-10-CM | POA: Diagnosis not present

## 2024-11-07 DIAGNOSIS — H353232 Exudative age-related macular degeneration, bilateral, with inactive choroidal neovascularization: Secondary | ICD-10-CM | POA: Diagnosis not present

## 2024-11-07 DIAGNOSIS — R7303 Prediabetes: Secondary | ICD-10-CM | POA: Diagnosis not present

## 2024-11-07 DIAGNOSIS — R569 Unspecified convulsions: Secondary | ICD-10-CM | POA: Diagnosis not present

## 2024-11-07 DIAGNOSIS — I480 Paroxysmal atrial fibrillation: Secondary | ICD-10-CM | POA: Diagnosis not present

## 2024-11-07 DIAGNOSIS — I69334 Monoplegia of upper limb following cerebral infarction affecting left non-dominant side: Secondary | ICD-10-CM

## 2024-11-07 LAB — COMPREHENSIVE METABOLIC PANEL WITH GFR
ALT: 18 U/L (ref 3–35)
AST: 18 U/L (ref 5–37)
Albumin: 4.3 g/dL (ref 3.5–5.2)
Alkaline Phosphatase: 78 U/L (ref 39–117)
BUN: 34 mg/dL — ABNORMAL HIGH (ref 6–23)
CO2: 32 meq/L (ref 19–32)
Calcium: 9.2 mg/dL (ref 8.4–10.5)
Chloride: 103 meq/L (ref 96–112)
Creatinine, Ser: 0.75 mg/dL (ref 0.40–1.20)
GFR: 78.91 mL/min
Glucose, Bld: 97 mg/dL (ref 70–99)
Potassium: 4 meq/L (ref 3.5–5.1)
Sodium: 139 meq/L (ref 135–145)
Total Bilirubin: 0.5 mg/dL (ref 0.2–1.2)
Total Protein: 7.1 g/dL (ref 6.0–8.3)

## 2024-11-07 LAB — CBC
HCT: 37.6 % (ref 36.0–46.0)
Hemoglobin: 12.7 g/dL (ref 12.0–15.0)
MCHC: 33.8 g/dL (ref 30.0–36.0)
MCV: 94.1 fl (ref 78.0–100.0)
Platelets: 354 K/uL (ref 150.0–400.0)
RBC: 4 Mil/uL (ref 3.87–5.11)
RDW: 13.3 % (ref 11.5–15.5)
WBC: 4.6 K/uL (ref 4.0–10.5)

## 2024-11-07 LAB — HEMOGLOBIN A1C: Hgb A1c MFr Bld: 5.8 % (ref 4.6–6.5)

## 2024-11-07 LAB — LIPID PANEL
Cholesterol: 147 mg/dL (ref 28–200)
HDL: 68.6 mg/dL
LDL Cholesterol: 67 mg/dL (ref 10–99)
NonHDL: 78.28
Total CHOL/HDL Ratio: 2
Triglycerides: 57 mg/dL (ref 10.0–149.0)
VLDL: 11.4 mg/dL (ref 0.0–40.0)

## 2024-11-07 NOTE — Patient Instructions (Signed)
 Tara [name]

## 2024-11-07 NOTE — Assessment & Plan Note (Signed)
 Up to date with eye visits.

## 2024-11-07 NOTE — Progress Notes (Signed)
] ° °  Subjective:   Patient ID: Tara [name], female    DOB: 1951/01/22, 74 y.o.   MRN: [id number]  Discussed the use of AI scribe software for clinical note transcription with the patient, who gave verbal consent to proceed.  History of Present Illness Tara [name] is a 74 year old female who presents for follow-up.  Her husband, Bud, recently sustained a hip fracture on January 5th, necessitating hospitalization and surgical intervention with the placement of three screws. He was discharged and is currently using a walker and receiving home therapy. Due to his limited independence, he frequently requires her assistance. She has made home modifications, including adjustments to the toilet and shower, to accommodate his needs.  She reports no new chest pain, breathing problems, or gastrointestinal issues. She had a past episode of severe neck pain. She has a history of macular degeneration with stable vision and regular ophthalmologic follow-ups.  Review of Systems  Constitutional: Negative.   HENT: Negative.    Eyes: Negative.   Respiratory:  Negative for cough, chest tightness and shortness of breath.   Cardiovascular:  Negative for chest pain, palpitations and leg swelling.  Gastrointestinal:  Negative for abdominal distention, abdominal pain, constipation, diarrhea, nausea and vomiting.  Musculoskeletal: Negative.   Skin: Negative.   Neurological: Negative.   Psychiatric/Behavioral: Negative.      Objective:  Physical Exam Constitutional:      Appearance: She is well-developed.  HENT:     Head: Normocephalic and atraumatic.  Cardiovascular:     Rate and Rhythm: Normal rate and regular rhythm.  Pulmonary:     Effort: Pulmonary effort is normal. No respiratory distress.     Breath sounds: Normal breath sounds. No wheezing or rales.  Abdominal:     General: Bowel sounds are normal. There is no distension.     Palpations: Abdomen is soft.     Tenderness: There  is no abdominal tenderness.  Musculoskeletal:     Cervical back: Normal range of motion.  Skin:    General: Skin is warm and dry.  Neurological:     Mental Status: She is alert and oriented to person, place, and time.     Coordination: Coordination normal.     Vitals:   11/07/24 0814  BP: 110/70  Pulse: 65  Temp: 98.5 F (36.9 C)  TempSrc: Oral  SpO2: 96%  Weight: 197 lb 12.8 oz (89.7 kg)  Height: 5' 4.5 (1.638 m)

## 2024-11-07 NOTE — Assessment & Plan Note (Signed)
 Stable and denies change or progression. May be slight regression in symptoms gradually.

## 2024-11-07 NOTE — Assessment & Plan Note (Signed)
 Off meds and still no recurrence of seizures. Avoid medications that affect seizure threshold.

## 2024-11-07 NOTE — Assessment & Plan Note (Signed)
 Taking eliquis  for anticoagulation and diltiazem  for control and sounds regular today. Continue and checking labs for stability.

## 2024-11-14 ENCOUNTER — Other Ambulatory Visit: Payer: Self-pay | Admitting: Internal Medicine

## 2024-11-24 ENCOUNTER — Other Ambulatory Visit: Payer: Self-pay | Admitting: Internal Medicine

## 2024-11-24 DIAGNOSIS — I10 Essential (primary) hypertension: Secondary | ICD-10-CM

## 2024-12-06 ENCOUNTER — Other Ambulatory Visit: Payer: Self-pay | Admitting: Cardiology

## 2025-04-03 ENCOUNTER — Encounter (INDEPENDENT_AMBULATORY_CARE_PROVIDER_SITE_OTHER): Admitting: Ophthalmology

## 2025-11-09 ENCOUNTER — Ambulatory Visit: Admitting: Internal Medicine
